# Patient Record
Sex: Male | Born: 1965 | Race: Black or African American | Hispanic: No | Marital: Single | Smoking: Former smoker
Health system: Southern US, Community
[De-identification: ages and names within clinical notes are randomized; demographics above are authoritative.]

## PROBLEM LIST (undated history)

## (undated) DIAGNOSIS — F1010 Alcohol abuse, uncomplicated: Secondary | ICD-10-CM

## (undated) DIAGNOSIS — E785 Hyperlipidemia, unspecified: Secondary | ICD-10-CM

## (undated) DIAGNOSIS — I639 Cerebral infarction, unspecified: Secondary | ICD-10-CM

## (undated) DIAGNOSIS — C3490 Malignant neoplasm of unspecified part of unspecified bronchus or lung: Secondary | ICD-10-CM

## (undated) DIAGNOSIS — E119 Type 2 diabetes mellitus without complications: Secondary | ICD-10-CM

## (undated) DIAGNOSIS — I10 Essential (primary) hypertension: Secondary | ICD-10-CM

## (undated) DIAGNOSIS — C649 Malignant neoplasm of unspecified kidney, except renal pelvis: Secondary | ICD-10-CM

## (undated) DIAGNOSIS — F17210 Nicotine dependence, cigarettes, uncomplicated: Secondary | ICD-10-CM

## (undated) HISTORY — DX: Cerebral infarction, unspecified: I63.9

## (undated) HISTORY — DX: Nicotine dependence, cigarettes, uncomplicated: F17.210

## (undated) HISTORY — DX: Hyperlipidemia, unspecified: E78.5

## (undated) HISTORY — DX: Type 2 diabetes mellitus without complications: E11.9

## (undated) HISTORY — DX: Essential (primary) hypertension: I10

## (undated) HISTORY — DX: Alcohol abuse, uncomplicated: F10.10

---

## 2002-03-31 ENCOUNTER — Encounter: Payer: Self-pay | Admitting: Emergency Medicine

## 2002-03-31 ENCOUNTER — Emergency Department (HOSPITAL_COMMUNITY): Admission: EM | Admit: 2002-03-31 | Discharge: 2002-03-31 | Payer: Self-pay | Admitting: Emergency Medicine

## 2002-04-09 ENCOUNTER — Ambulatory Visit (HOSPITAL_COMMUNITY): Admission: RE | Admit: 2002-04-09 | Discharge: 2002-04-09 | Payer: Self-pay | Admitting: Orthopaedic Surgery

## 2002-04-09 ENCOUNTER — Encounter: Payer: Self-pay | Admitting: Orthopaedic Surgery

## 2012-08-13 DIAGNOSIS — R55 Syncope and collapse: Secondary | ICD-10-CM

## 2013-09-18 ENCOUNTER — Other Ambulatory Visit (HOSPITAL_COMMUNITY): Payer: Self-pay

## 2013-09-18 DIAGNOSIS — G4730 Sleep apnea, unspecified: Secondary | ICD-10-CM

## 2013-10-23 ENCOUNTER — Other Ambulatory Visit (HOSPITAL_COMMUNITY): Payer: Self-pay

## 2013-10-23 DIAGNOSIS — G4730 Sleep apnea, unspecified: Secondary | ICD-10-CM

## 2018-01-23 DIAGNOSIS — M25561 Pain in right knee: Secondary | ICD-10-CM | POA: Insufficient documentation

## 2018-02-01 ENCOUNTER — Ambulatory Visit (INDEPENDENT_AMBULATORY_CARE_PROVIDER_SITE_OTHER): Payer: Self-pay | Admitting: Orthopaedic Surgery

## 2020-04-13 DIAGNOSIS — D751 Secondary polycythemia: Secondary | ICD-10-CM

## 2020-04-13 HISTORY — DX: Secondary polycythemia: D75.1

## 2020-05-25 ENCOUNTER — Other Ambulatory Visit (HOSPITAL_BASED_OUTPATIENT_CLINIC_OR_DEPARTMENT_OTHER): Payer: Self-pay

## 2020-05-25 DIAGNOSIS — G4733 Obstructive sleep apnea (adult) (pediatric): Secondary | ICD-10-CM

## 2020-06-01 ENCOUNTER — Other Ambulatory Visit: Payer: Self-pay | Admitting: Nurse Practitioner

## 2020-06-01 ENCOUNTER — Ambulatory Visit (INDEPENDENT_AMBULATORY_CARE_PROVIDER_SITE_OTHER): Payer: Managed Care, Other (non HMO) | Admitting: Nurse Practitioner

## 2020-06-01 ENCOUNTER — Encounter: Payer: Self-pay | Admitting: Nurse Practitioner

## 2020-06-01 ENCOUNTER — Other Ambulatory Visit: Payer: Self-pay

## 2020-06-01 VITALS — BP 140/80 | HR 96 | Temp 99.1°F | Resp 22 | Ht 72.0 in | Wt 305.0 lb

## 2020-06-01 DIAGNOSIS — E1165 Type 2 diabetes mellitus with hyperglycemia: Secondary | ICD-10-CM

## 2020-06-01 DIAGNOSIS — Z8673 Personal history of transient ischemic attack (TIA), and cerebral infarction without residual deficits: Secondary | ICD-10-CM

## 2020-06-01 DIAGNOSIS — I10 Essential (primary) hypertension: Secondary | ICD-10-CM | POA: Diagnosis not present

## 2020-06-01 DIAGNOSIS — E785 Hyperlipidemia, unspecified: Secondary | ICD-10-CM

## 2020-06-01 DIAGNOSIS — F17210 Nicotine dependence, cigarettes, uncomplicated: Secondary | ICD-10-CM

## 2020-06-01 DIAGNOSIS — F1020 Alcohol dependence, uncomplicated: Secondary | ICD-10-CM

## 2020-06-01 DIAGNOSIS — D751 Secondary polycythemia: Secondary | ICD-10-CM | POA: Diagnosis not present

## 2020-06-01 NOTE — Addendum Note (Signed)
Addended by: Lonn Georgia on: 06/01/2020 10:26 AM   Modules accepted: Orders

## 2020-06-01 NOTE — Addendum Note (Signed)
Addended by: Demetrius Revel on: 06/01/2020 10:20 AM   Modules accepted: Orders

## 2020-06-01 NOTE — Patient Instructions (Addendum)
Return to occupational health, and discuss blood pressure issues.  We will follow-up in 2 months for labs.

## 2020-06-01 NOTE — Progress Notes (Signed)
New Patient Office Visit  Subjective:  Patient ID: Derek Owen, male    DOB: [birth year]  Age: 54 y.o. MRN: [id number]  CC:  Chief Complaint  Patient presents with  . New Patient (Initial Visit)    HPI Derek Owen presents for new patient visit. Transferring care from Wende Neighbors, MD. Last labs and physical drawn 04/13/20.   Past Medical History:  Diagnosis Date  . Alcohol use disorder, mild, abuse   . Cigarette smoker   . Diabetes mellitus without complication ([place])   . Hyperlipidemia   . Hypertension   . Polycythemia 04/13/2020  . Stroke [facility])     History reviewed. No pertinent surgical history.  History reviewed. No pertinent family history.  Social History   Socioeconomic History  . Marital status: Single    Spouse name: Not on file  . Number of children: Not on file  . Years of education: Not on file  . Highest education level: Not on file  Occupational History  . Not on file  Tobacco Use  . Smoking status: Current Every Day Smoker    Packs/day: 1.00    Types: Cigarettes  . Smokeless tobacco: Current User  Substance and Sexual Activity  . Alcohol use: Yes    Comment: states he quit today, but as of 04/13/20 he could drink a fifth of liquor in a day  . Drug use: Not Currently  . Sexual activity: Not Currently  Other Topics Concern  . Not on file  Social History Narrative  . Not on file   Social Determinants of Health   Financial Resource Strain:   . Difficulty of Paying Living Expenses: Not on file  Food Insecurity:   . Worried About Charity fundraiser in the Last Year: Not on file  . Ran Out of Food in the Last Year: Not on file  Transportation Needs:   . Lack of Transportation (Medical): Not on file  . Lack of Transportation (Non-Medical): Not on file  Physical Activity:   . Days of Exercise per Week: Not on file  . Minutes of Exercise per Session: Not on file  Stress:   . Feeling of Stress : Not on file  Social Connections:   .  Frequency of Communication with Friends and Family: Not on file  . Frequency of Social Gatherings with Friends and Family: Not on file  . Attends Religious Services: Not on file  . Active Member of Clubs or Organizations: Not on file  . Attends Archivist Meetings: Not on file  . Marital Status: Not on file  Intimate Partner Violence:   . Fear of Current or Ex-Partner: Not on file  . Emotionally Abused: Not on file  . Physically Abused: Not on file  . Sexually Abused: Not on file    ROS Review of Systems  Constitutional: Negative.   Respiratory: Negative.   Cardiovascular: Negative.   Musculoskeletal: Negative.   Neurological: Negative.   Psychiatric/Behavioral: Negative.     Objective:   Today's Vitals: BP 140/80   Pulse 96   Temp 99.1 F (37.3 C)   Resp (!) 22   Ht 6' (1.829 m)   Wt (!) 305 lb (138.3 kg)   SpO2 97%   BMI 41.37 kg/m   Physical Exam Constitutional:      Appearance: Normal appearance. He is obese.  Cardiovascular:     Rate and Rhythm: Normal rate and regular rhythm.     Pulses: Normal pulses.  Heart sounds: Normal heart sounds.  Pulmonary:     Effort: Pulmonary effort is normal.     Breath sounds: Normal breath sounds.  Musculoskeletal:        General: Normal range of motion.  Neurological:     Mental Status: He is alert.  Psychiatric:        Mood and Affect: Mood normal.        Behavior: Behavior normal.     Assessment & Plan:   Problem List Items Addressed This Visit    None    Visit Diagnoses    Hypertension, essential    -  Primary   Relevant Medications   atorvastatin (LIPITOR) 40 MG tablet   amLODipine (NORVASC) 10 MG tablet   losartan-hydrochlorothiazide (HYZAAR) 100-25 MG tablet   aspirin 81 MG chewable tablet   Secondary polycythemia       Hyperlipidemia, unspecified hyperlipidemia type       Relevant Medications   atorvastatin (LIPITOR) 40 MG tablet   amLODipine (NORVASC) 10 MG tablet    losartan-hydrochlorothiazide (HYZAAR) 100-25 MG tablet   aspirin 81 MG chewable tablet   Uncontrolled type 2 diabetes mellitus with hyperglycemia (HCC)       Relevant Medications   glipiZIDE (GLUCOTROL) 10 MG tablet   atorvastatin (LIPITOR) 40 MG tablet   losartan-hydrochlorothiazide (HYZAAR) 100-25 MG tablet   sitaGLIPtin (JANUVIA) 100 MG tablet   aspirin 81 MG chewable tablet   Personal history of TIA (transient ischemic attack)       Cigarette smoker       Alcohol use disorder, moderate, dependence ([place])          Outpatient Encounter Medications as of 06/01/2020  Medication Sig  . amLODipine (NORVASC) 10 MG tablet Take 10 mg by mouth daily.  Marland Kitchen aspirin 81 MG chewable tablet Chew by mouth daily.  Marland Kitchen atorvastatin (LIPITOR) 40 MG tablet Take 40 mg by mouth daily.  Marland Kitchen glipiZIDE (GLUCOTROL) 10 MG tablet Take 10 mg by mouth daily before breakfast.  . losartan-hydrochlorothiazide (HYZAAR) 100-25 MG tablet Take 1 tablet by mouth daily.  . sitaGLIPtin (JANUVIA) 100 MG tablet Take 100 mg by mouth daily.   No facility-administered encounter medications on file as of 06/01/2020.    HTN -BP 140/80 today -takes HCTZ-losartan daily  HLD -no labs to review today -takes atorvastatin  DM2 -no A1c form today, last drawn Oct 2021 -takes farxiga -takes glipizide -no metformin d/t GI effects -last A1c 8.0 -he was started on Rybelsus at last OV in 04/2020 -on ACE-I and statin  Hx of CVA -takes ASA daily -occurred in 2014, no residual deficits per pt  Nicotine Use -1 ppd smoker since age 1  Alcohol Use Disorder -he states his BP was significantly elevated a few weeks ago -he was drinking up to a fifth per day on his days off -states he quit drinking about a week ago  OSA -considering sleep study with Dr. Merlene Laughter -was referred by occupational health after having elevated BP  Follow-up: Return in about 2 months (around 08/01/2020) for Discuss routine lab work and manage chronic  conditions.   Noreene Larsson, NP

## 2020-06-03 ENCOUNTER — Other Ambulatory Visit: Payer: Self-pay | Admitting: Nurse Practitioner

## 2020-06-03 DIAGNOSIS — R718 Other abnormality of red blood cells: Secondary | ICD-10-CM

## 2020-06-03 LAB — COMPREHENSIVE METABOLIC PANEL
ALT: 30 IU/L (ref 0–44)
AST: 19 IU/L (ref 0–40)
Albumin/Globulin Ratio: 1.3 (ref 1.2–2.2)
Albumin: 4.4 g/dL (ref 3.8–4.9)
Alkaline Phosphatase: 100 IU/L (ref 44–121)
BUN/Creatinine Ratio: 16 (ref 9–20)
BUN: 16 mg/dL (ref 6–24)
Bilirubin Total: 0.4 mg/dL (ref 0.0–1.2)
CO2: 22 mmol/L (ref 20–29)
Calcium: 9.9 mg/dL (ref 8.7–10.2)
Chloride: 98 mmol/L (ref 96–106)
Creatinine, Ser: 0.97 mg/dL (ref 0.76–1.27)
GFR calc Af Amer: 101 mL/min/{1.73_m2} (ref >59)
GFR calc non Af Amer: 88 mL/min/{1.73_m2} (ref >59)
Globulin, Total: 3.4 g/dL (ref 1.5–4.5)
Glucose: 176 mg/dL — ABNORMAL HIGH (ref 65–99)
Potassium: 4.1 mmol/L (ref 3.5–5.2)
Sodium: 136 mmol/L (ref 134–144)
Total Protein: 7.8 g/dL (ref 6.0–8.5)

## 2020-06-03 LAB — MICROALBUMIN / CREATININE URINE RATIO
Creatinine, Urine: 93.9 mg/dL
Microalb/Creat Ratio: 41 mg/g creat — ABNORMAL HIGH (ref 0–29)
Microalbumin, Urine: 38.8 ug/mL

## 2020-06-03 LAB — CBC WITH DIFFERENTIAL/PLATELET
Basophils Absolute: 0.1 10*3/uL (ref 0.0–0.2)
Basos: 1 %
EOS (ABSOLUTE): 0.1 10*3/uL (ref 0.0–0.4)
Eos: 1 %
Hematocrit: 44.5 % (ref 37.5–51.0)
Hemoglobin: 14.4 g/dL (ref 13.0–17.7)
Immature Grans (Abs): 0.1 10*3/uL (ref 0.0–0.1)
Immature Granulocytes: 1 %
Lymphocytes Absolute: 0.9 10*3/uL (ref 0.7–3.1)
Lymphs: 12 %
MCH: 22.9 pg — ABNORMAL LOW (ref 26.6–33.0)
MCHC: 32.4 g/dL (ref 31.5–35.7)
MCV: 71 fL — ABNORMAL LOW (ref 79–97)
Monocytes Absolute: 1.2 10*3/uL — ABNORMAL HIGH (ref 0.1–0.9)
Monocytes: 15 %
Neutrophils Absolute: 5.5 10*3/uL (ref 1.4–7.0)
Neutrophils: 70 %
Platelets: 256 10*3/uL (ref 150–450)
RBC: 6.29 x10E6/uL — ABNORMAL HIGH (ref 4.14–5.80)
RDW: 18.4 % — ABNORMAL HIGH (ref 11.6–15.4)
WBC: 7.8 10*3/uL (ref 3.4–10.8)

## 2020-06-03 LAB — LIPID PANEL W/O CHOL/HDL RATIO
Cholesterol, Total: 142 mg/dL (ref 100–199)
HDL: 49 mg/dL (ref >39)
LDL Chol Calc (NIH): 77 mg/dL (ref 0–99)
Triglycerides: 83 mg/dL (ref 0–149)
VLDL Cholesterol Cal: 16 mg/dL (ref 5–40)

## 2020-06-03 LAB — HGB A1C W/O EAG: Hgb A1c MFr Bld: 6.9 % — ABNORMAL HIGH (ref 4.8–5.6)

## 2020-06-11 ENCOUNTER — Other Ambulatory Visit: Payer: Self-pay

## 2020-06-11 ENCOUNTER — Ambulatory Visit: Payer: Managed Care, Other (non HMO) | Attending: Neurology | Admitting: Neurology

## 2020-06-11 DIAGNOSIS — G4733 Obstructive sleep apnea (adult) (pediatric): Secondary | ICD-10-CM | POA: Diagnosis not present

## 2020-06-14 NOTE — Procedures (Signed)
[place] A. Merlene Laughter, MD     [email or web address]             NOCTURNAL POLYSOMNOGRAPHY   LOCATION: [facility]    Patient Name: Derek Owen, Derek Owen Date: 06/11/2020 Gender: Male D.O.B: 1966-02-01 Age (years): 45 Referring Provider: Phillips Odor MD, ABSM Height (inches): 72 Interpreting Physician: Phillips Odor MD, ABSM Weight (lbs): 306 RPSGT: Peak, Robert BMI: 41 MRN: [id number] Neck Size: 19.00 CLINICAL INFORMATION Sleep Study Type: Split Night CPAP     Indication for sleep study: N/A     Epworth Sleepiness Score: 8  SLEEP STUDY TECHNIQUE As per the AASM Manual for the Scoring of Sleep and Associated Events v2.3 (April 2016) with a hypopnea requiring 4% desaturations.  The channels recorded and monitored were frontal, central and occipital EEG, electrooculogram (EOG), submentalis EMG (chin), nasal and oral airflow, thoracic and abdominal wall motion, anterior tibialis EMG, snore microphone, electrocardiogram, and pulse oximetry. Continuous positive airway pressure (CPAP) was initiated when the patient met split night criteria and was titrated according to treat sleep-disordered breathing.  MEDICATIONS Medications self-administered by patient taken the night of the study : N/A  Current Outpatient Medications:  .  amLODipine (NORVASC) 10 MG tablet, Take 10 mg by mouth daily., Disp: , Rfl:  .  aspirin 81 MG chewable tablet, Chew by mouth daily., Disp: , Rfl:  .  atorvastatin (LIPITOR) 40 MG tablet, Take 40 mg by mouth daily., Disp: , Rfl:  .  glipiZIDE (GLUCOTROL) 10 MG tablet, Take 10 mg by mouth daily before breakfast., Disp: , Rfl:  .  losartan-hydrochlorothiazide (HYZAAR) 100-25 MG tablet, Take 1 tablet by mouth daily., Disp: , Rfl:  .  sitaGLIPtin (JANUVIA) 100 MG tablet, Take 100 mg by mouth daily., Disp: , Rfl:    RESPIRATORY PARAMETERS Diagnostic  Total AHI (/hr): 35.7 RDI (/hr): 35.7 OA Index (/hr): - CA Index (/hr): 0.0 REM  AHI (/hr): 32.0 NREM AHI (/hr): 36.1 Supine AHI (/hr): 53.5 Non-supine AHI (/hr): 26.7 Min O2 Sat (%): 84.00 Mean O2 (%): 91.59 Time below 88% (min): 25.2   Titration  Optimal Pressure (cm): 10 AHI at Optimal Pressure (/hr): 0.0 Min O2 at Optimal Pressure (%): 91.0 Supine % at Optimal (%): 0 Sleep % at Optimal (%): 100   SLEEP ARCHITECTURE The recording time for the entire night was 460.6 minutes.  During a baseline period of 198.2 minutes, the patient slept for 149.5 minutes in REM and nonREM, yielding a sleep efficiency of 75.4%. Sleep onset after lights out was 9.4 minutes with a REM latency of 142.5 minutes. The patient spent 16.72% of the night in stage N1 sleep, 73.24% in stage N2 sleep, 0.00% in stage N3 and 10% in REM.     During the titration period of 257.0 minutes, the patient slept for 202.2 minutes in REM and nonREM, yielding a sleep efficiency of 78.7%. Sleep onset after CPAP initiation was 43.3 minutes with a REM latency of 65.0 minutes. The patient spent 46.34% of the night in stage N1 sleep, 41.05% in stage N2 sleep, 0.74% in stage N3 and 11.9% in REM.  CARDIAC DATA The 2 lead EKG demonstrated sinus rhythm. The mean heart rate was 87.54 beats per minute. Other EKG findings include: PVCs.  LEG MOVEMENT DATA The total Periodic Limb Movements of Sleep (PLMS) were 0. The PLMS index was 0.00.  IMPRESSION Moderate to severe obstructive sleep apnea occurred during the diagnostic portion of the study (AHI = 35.7/hour). The optimal CPAP selected  for this patient is ( 10 cm of water)   Delano Metz, MD Diplomate, American Board of Sleep Medicine.      ELECTRONICALLY SIGNED ON:  06/14/2020, 12:47 PM [place] PH: [phone]   FX: [phone] [place]

## 2020-07-20 ENCOUNTER — Telehealth: Payer: Self-pay

## 2020-07-20 NOTE — Telephone Encounter (Signed)
Patient is requesting to speak with you in regards to a mix up with his medications p# [phone]

## 2020-07-20 NOTE — Telephone Encounter (Signed)
Pt LVM that he needs medication called in

## 2020-07-21 ENCOUNTER — Telehealth: Payer: Self-pay

## 2020-07-21 ENCOUNTER — Other Ambulatory Visit: Payer: Self-pay

## 2020-07-21 DIAGNOSIS — E1165 Type 2 diabetes mellitus with hyperglycemia: Secondary | ICD-10-CM

## 2020-07-21 MED ORDER — GLIPIZIDE 10 MG PO TABS: 10.0000 mg | ORAL_TABLET | Freq: Every day | ORAL | 1 refills | Status: DC

## 2020-07-21 NOTE — Telephone Encounter (Signed)
Glipizide sent.

## 2020-07-21 NOTE — Telephone Encounter (Signed)
Rx sent in, pt informed.

## 2020-07-21 NOTE — Telephone Encounter (Signed)
I spoke with pt, went over medications. Clarified and got rx refill sent.

## 2020-07-21 NOTE — Telephone Encounter (Signed)
Patient needing refills sent to [facility] in [place] for glipizide p# [phone]

## 2020-08-10 ENCOUNTER — Other Ambulatory Visit: Payer: Self-pay

## 2020-08-10 DIAGNOSIS — E1165 Type 2 diabetes mellitus with hyperglycemia: Secondary | ICD-10-CM

## 2020-08-10 MED ORDER — GLIPIZIDE 10 MG PO TABS: 10.0000 mg | ORAL_TABLET | Freq: Every day | ORAL | 1 refills | Status: DC

## 2020-08-31 ENCOUNTER — Other Ambulatory Visit: Payer: Self-pay

## 2020-08-31 ENCOUNTER — Encounter: Payer: Self-pay | Admitting: Nurse Practitioner

## 2020-08-31 ENCOUNTER — Telehealth (INDEPENDENT_AMBULATORY_CARE_PROVIDER_SITE_OTHER): Payer: Managed Care, Other (non HMO) | Admitting: Nurse Practitioner

## 2020-08-31 DIAGNOSIS — M109 Gout, unspecified: Secondary | ICD-10-CM

## 2020-08-31 MED ORDER — COLCHICINE 0.6 MG PO TABS: 0.6000 mg | ORAL_TABLET | Freq: Every day | ORAL | 0 refills | Status: DC | PRN

## 2020-08-31 MED ORDER — DAPAGLIFLOZIN PROPANEDIOL 10 MG PO TABS: 10.0000 mg | ORAL_TABLET | Freq: Every day | ORAL | 1 refills | Status: DC

## 2020-08-31 NOTE — Assessment & Plan Note (Signed)
-  Rx. Colchicine and indomethacin

## 2020-08-31 NOTE — Progress Notes (Signed)
Acute Office Visit  Subjective:    Patient ID: Derek Owen, male    DOB: 18-Jul-1965, 55 y.o.   MRN: [id number]  Chief Complaint  Patient presents with  . Arthritis    Hx of rheumatoid arthritis. Gout    HPI Patient is in today for gout flare. Pain to left knee and to both hands. When he wakes up his hand is swollen and painful. He has taken naproxen.  Past Medical History:  Diagnosis Date  . Alcohol use disorder, mild, abuse   . Cigarette smoker   . Diabetes mellitus without complication ([place])   . Hyperlipidemia   . Hypertension   . Polycythemia 04/13/2020  . Stroke [facility])     History reviewed. No pertinent surgical history.  History reviewed. No pertinent family history.  Social History   Socioeconomic History  . Marital status: Single    Spouse name: Not on file  . Number of children: Not on file  . Years of education: Not on file  . Highest education level: Not on file  Occupational History  . Not on file  Tobacco Use  . Smoking status: Current Every Day Smoker    Packs/day: 1.00    Types: Cigarettes  . Smokeless tobacco: Current User  Substance and Sexual Activity  . Alcohol use: Yes    Comment: states he quit today, but as of 04/13/20 he could drink a fifth of liquor in a day  . Drug use: Not Currently  . Sexual activity: Not Currently  Other Topics Concern  . Not on file  Social History Narrative  . Not on file   Social Determinants of Health   Financial Resource Strain: Not on file  Food Insecurity: Not on file  Transportation Needs: Not on file  Physical Activity: Not on file  Stress: Not on file  Social Connections: Not on file  Intimate Partner Violence: Not on file    Outpatient Medications Prior to Visit  Medication Sig Dispense Refill  . amLODipine (NORVASC) 10 MG tablet Take 10 mg by mouth daily.    Marland Kitchen aspirin 81 MG chewable tablet Chew by mouth daily.    Marland Kitchen atorvastatin (LIPITOR) 40 MG tablet Take 40 mg by mouth daily.    Marland Kitchen  glipiZIDE (GLUCOTROL) 10 MG tablet Take 1 tablet (10 mg total) by mouth daily before breakfast. 90 tablet 1  . losartan-hydrochlorothiazide (HYZAAR) 100-25 MG tablet Take 1 tablet by mouth daily.    . sitaGLIPtin (JANUVIA) 100 MG tablet Take 100 mg by mouth daily.     No facility-administered medications prior to visit.    No Known Allergies  Review of Systems     Objective:    Physical Exam  There were no vitals taken for this visit. Wt Readings from Last 3 Encounters:  06/01/20 (!) 305 lb (138.3 kg)    Health Maintenance Due  Topic Date Due  . Hepatitis C Screening  Never done  . COVID-19 Vaccine (1) Never done  . HIV Screening  Never done  . TETANUS/TDAP  Never done  . COLONOSCOPY (Pts 45-83yrs Insurance coverage will need to be confirmed)  Never done  . INFLUENZA VACCINE  Never done    There are no preventive care reminders to display for this patient.   No results found for: TSH Lab Results  Component Value Date   WBC 7.8 06/01/2020   HGB 14.4 06/01/2020   HCT 44.5 06/01/2020   MCV 71 (L) 06/01/2020   PLT 256 06/01/2020  Lab Results  Component Value Date   NA 136 06/01/2020   K 4.1 06/01/2020   CO2 22 06/01/2020   GLUCOSE 176 (H) 06/01/2020   BUN 16 06/01/2020   CREATININE 0.97 06/01/2020   BILITOT 0.4 06/01/2020   ALKPHOS 100 06/01/2020   AST 19 06/01/2020   ALT 30 06/01/2020   PROT 7.8 06/01/2020   ALBUMIN 4.4 06/01/2020   CALCIUM 9.9 06/01/2020   Lab Results  Component Value Date   CHOL 142 06/01/2020   Lab Results  Component Value Date   HDL 49 06/01/2020   Lab Results  Component Value Date   LDLCALC 77 06/01/2020   Lab Results  Component Value Date   TRIG 83 06/01/2020   No results found for: [facility] Lab Results  Component Value Date   HGBA1C 6.9 (H) 06/01/2020       Assessment & Plan:   Problem List Items Addressed This Visit      Other   Gout    -Rx. Colchicine and indomethacin          Meds ordered this  encounter  Medications  . dapagliflozin propanediol (FARXIGA) 10 MG TABS tablet    Sig: Take 1 tablet (10 mg total) by mouth daily before breakfast.    Dispense:  90 tablet    Refill:  1  . colchicine 0.6 MG tablet    Sig: Take 1 tablet (0.6 mg total) by mouth daily as needed. Take 2 tabs now and 1 tablet in 2 hours if symptoms persist    Dispense:  6 tablet    Refill:  0   Date:  08/31/2020   Location of Patient: Home Location of Provider: Office Consent was obtain for visit to be over via telehealth. I verified that I am speaking with the correct person using two identifiers.  I connected with  [name] on 08/31/20 via telephone and verified that I am speaking with the correct person using two identifiers.   I discussed the limitations of evaluation and management by telemedicine. The patient expressed understanding and agreed to proceed.  Time spent: 15 min   Noreene Larsson, NP

## 2020-09-15 ENCOUNTER — Encounter: Payer: Self-pay | Admitting: Nurse Practitioner

## 2020-09-15 ENCOUNTER — Telehealth: Payer: Managed Care, Other (non HMO) | Admitting: Nurse Practitioner

## 2020-09-15 ENCOUNTER — Other Ambulatory Visit: Payer: Self-pay

## 2020-09-15 DIAGNOSIS — M109 Gout, unspecified: Secondary | ICD-10-CM

## 2020-09-15 MED ORDER — ALLOPURINOL 300 MG PO TABS: ORAL_TABLET | ORAL | 0 refills | Status: DC

## 2020-09-15 MED ORDER — COLCHICINE 0.6 MG PO TABS: 0.6000 mg | ORAL_TABLET | Freq: Every day | ORAL | 0 refills | Status: DC | PRN

## 2020-09-15 MED ORDER — KETOROLAC TROMETHAMINE 10 MG PO TABS
10.0000 mg | ORAL_TABLET | Freq: Three times a day (TID) | ORAL | 0 refills | Status: DC | PRN
Start: 2020-09-15 — End: 2020-10-08

## 2020-09-15 NOTE — Assessment & Plan Note (Signed)
-  Rx. Colchicine -Rx. toradol -Rx. Allopurinol; start after gout flare resolves -if pain doesn't resolve, will consider ortho consult -discussed not using aleve or goody's or other NSAIDs while taking toradol -continue prednisone from UC -may consider stopping HCTZ if gout flares continue

## 2020-09-15 NOTE — Progress Notes (Signed)
Acute Office Visit  Subjective:    Patient ID: Derek Owen, male    DOB: [birth year], 55 y.o.   MRN: [id number]  Chief Complaint  Patient presents with  . Knee Pain    Seen at urgent care on Sunday for L knee pain, was given steroid shot and treated for gout. Pain came back this morning.     HPI Patient is in today for gout-related pain. He was seen at [facility] urgent care on 09/13/20 for a gout flare to his left knee.  He was treated with a toradol injection as well as dexamethasone injection. He was sent home with PO prednisone (6-day dose pack). He states he felt great on Sunday. He took naproxen and a BC powder and that helped some.  Past Medical History:  Diagnosis Date  . Alcohol use disorder, mild, abuse   . Cigarette smoker   . Diabetes mellitus without complication ([place])   . Hyperlipidemia   . Hypertension   . Polycythemia 04/13/2020  . Stroke [facility])     No past surgical history on file.  No family history on file.  Social History   Socioeconomic History  . Marital status: Single    Spouse name: Not on file  . Number of children: Not on file  . Years of education: Not on file  . Highest education level: Not on file  Occupational History  . Not on file  Tobacco Use  . Smoking status: Current Every Day Smoker    Packs/day: 1.00    Types: Cigarettes  . Smokeless tobacco: Current User  Substance and Sexual Activity  . Alcohol use: Yes    Comment: states he quit today, but as of 04/13/20 he could drink a fifth of liquor in a day  . Drug use: Not Currently  . Sexual activity: Not Currently  Other Topics Concern  . Not on file  Social History Narrative  . Not on file   Social Determinants of Health   Financial Resource Strain: Not on file  Food Insecurity: Not on file  Transportation Needs: Not on file  Physical Activity: Not on file  Stress: Not on file  Social Connections: Not on file  Intimate Partner Violence: Not on file    Outpatient Medications  Prior to Visit  Medication Sig Dispense Refill  . amLODipine (NORVASC) 10 MG tablet Take 10 mg by mouth daily.    Marland Kitchen aspirin 81 MG chewable tablet Chew by mouth daily.    Marland Kitchen atorvastatin (LIPITOR) 40 MG tablet Take 40 mg by mouth daily.    . dapagliflozin propanediol (FARXIGA) 10 MG TABS tablet Take 1 tablet (10 mg total) by mouth daily before breakfast. 90 tablet 1  . glipiZIDE (GLUCOTROL) 10 MG tablet Take 1 tablet (10 mg total) by mouth daily before breakfast. 90 tablet 1  . losartan-hydrochlorothiazide (HYZAAR) 100-25 MG tablet Take 1 tablet by mouth daily.    . sitaGLIPtin (JANUVIA) 100 MG tablet Take 100 mg by mouth daily.    . colchicine 0.6 MG tablet Take 1 tablet (0.6 mg total) by mouth daily as needed. Take 2 tabs now and 1 tablet in 2 hours if symptoms persist 6 tablet 0  . ketorolac (TORADOL) 10 MG tablet Take 10 mg by mouth 3 (three) times daily as needed.     No facility-administered medications prior to visit.    No Known Allergies  Review of Systems  Constitutional: Negative.   Respiratory: Negative.   Cardiovascular: Negative.   Musculoskeletal:  Positive for arthralgias.       Left knee pain; prednisone and aleve not helping       Objective:    Physical Exam  There were no vitals taken for this visit. Wt Readings from Last 3 Encounters:  06/01/20 (!) 305 lb (138.3 kg)    Health Maintenance Due  Topic Date Due  . Hepatitis C Screening  Never done  . COVID-19 Vaccine (1) Never done  . HIV Screening  Never done  . TETANUS/TDAP  Never done  . COLONOSCOPY (Pts 45-66yrs Insurance coverage will need to be confirmed)  Never done  . INFLUENZA VACCINE  Never done    There are no preventive care reminders to display for this patient.   No results found for: TSH Lab Results  Component Value Date   WBC 7.8 06/01/2020   HGB 14.4 06/01/2020   HCT 44.5 06/01/2020   MCV 71 (L) 06/01/2020   PLT 256 06/01/2020   Lab Results  Component Value Date   NA 136  06/01/2020   K 4.1 06/01/2020   CO2 22 06/01/2020   GLUCOSE 176 (H) 06/01/2020   BUN 16 06/01/2020   CREATININE 0.97 06/01/2020   BILITOT 0.4 06/01/2020   ALKPHOS 100 06/01/2020   AST 19 06/01/2020   ALT 30 06/01/2020   PROT 7.8 06/01/2020   ALBUMIN 4.4 06/01/2020   CALCIUM 9.9 06/01/2020   Lab Results  Component Value Date   CHOL 142 06/01/2020   Lab Results  Component Value Date   HDL 49 06/01/2020   Lab Results  Component Value Date   LDLCALC 77 06/01/2020   Lab Results  Component Value Date   TRIG 83 06/01/2020   No results found for: CHOLHDL Lab Results  Component Value Date   HGBA1C 6.9 (H) 06/01/2020       Assessment & Plan:   Problem List Items Addressed This Visit   None    Date:  09/15/2020   Location of Patient: Home Location of Provider: Office Consent was obtain for visit to be over via telehealth. I verified that I am speaking with the correct person using two identifiers.  I connected with  [name] on 09/15/20 via telephone and verified that I am speaking with the correct person using two identifiers.   I discussed the limitations of evaluation and management by telemedicine. The patient expressed understanding and agreed to proceed.  Time spent: 25 minutes   Meds ordered this encounter  Medications  . colchicine 0.6 MG tablet    Sig: Take 1 tablet (0.6 mg total) by mouth daily as needed. Take 2 tabs now and 1 tablet in 2 hours if symptoms persist    Dispense:  6 tablet    Refill:  0  . allopurinol (ZYLOPRIM) 300 MG tablet    Sig: Take 0.5 tablets (150 mg total) by mouth daily for 14 days, THEN 1 tablet (300 mg total) daily. Start 1 week after gout flare resolves..    Dispense:  83 tablet    Refill:  0  . ketorolac (TORADOL) 10 MG tablet    Sig: Take 1 tablet (10 mg total) by mouth every 8 (eight) hours as needed for moderate pain or severe pain.    Dispense:  15 tablet    Refill:  0     Noreene Larsson, NP

## 2020-09-15 NOTE — Patient Instructions (Signed)
If knee pain isn't bette rin 5 days, call office, and we will consider ortho consult.

## 2020-09-21 ENCOUNTER — Other Ambulatory Visit: Payer: Self-pay | Admitting: Nurse Practitioner

## 2020-09-21 DIAGNOSIS — M130 Polyarthritis, unspecified: Secondary | ICD-10-CM

## 2020-09-22 ENCOUNTER — Other Ambulatory Visit: Payer: Self-pay | Admitting: Nurse Practitioner

## 2020-09-22 DIAGNOSIS — M130 Polyarthritis, unspecified: Secondary | ICD-10-CM

## 2020-09-22 LAB — CBC WITH DIFFERENTIAL/PLATELET
Basophils Absolute: 0.1 10*3/uL (ref 0.0–0.2)
Basos: 1 %
EOS (ABSOLUTE): 0.1 10*3/uL (ref 0.0–0.4)
Eos: 1 %
Hematocrit: 43.7 % (ref 37.5–51.0)
Hemoglobin: 13.7 g/dL (ref 13.0–17.7)
Immature Grans (Abs): 0.1 10*3/uL (ref 0.0–0.1)
Immature Granulocytes: 1 %
Lymphocytes Absolute: 1.2 10*3/uL (ref 0.7–3.1)
Lymphs: 16 %
MCH: 22.1 pg — ABNORMAL LOW (ref 26.6–33.0)
MCHC: 31.4 g/dL — ABNORMAL LOW (ref 31.5–35.7)
MCV: 71 fL — ABNORMAL LOW (ref 79–97)
Monocytes Absolute: 0.9 10*3/uL (ref 0.1–0.9)
Monocytes: 12 %
Neutrophils Absolute: 5.2 10*3/uL (ref 1.4–7.0)
Neutrophils: 69 %
Platelets: 266 10*3/uL (ref 150–450)
RBC: 6.2 x10E6/uL — ABNORMAL HIGH (ref 4.14–5.80)
RDW: 17.4 % — ABNORMAL HIGH (ref 11.6–15.4)
WBC: 7.5 10*3/uL (ref 3.4–10.8)

## 2020-09-22 LAB — CMP14+EGFR
ALT: 13 IU/L (ref 0–44)
AST: 7 IU/L (ref 0–40)
Albumin/Globulin Ratio: 1.4 (ref 1.2–2.2)
Albumin: 4.2 g/dL (ref 3.8–4.9)
Alkaline Phosphatase: 108 IU/L (ref 44–121)
BUN/Creatinine Ratio: 18 (ref 9–20)
BUN: 18 mg/dL (ref 6–24)
Bilirubin Total: 0.5 mg/dL (ref 0.0–1.2)
CO2: 24 mmol/L (ref 20–29)
Calcium: 9.4 mg/dL (ref 8.7–10.2)
Chloride: 99 mmol/L (ref 96–106)
Creatinine, Ser: 1.01 mg/dL (ref 0.76–1.27)
Globulin, Total: 3.1 g/dL (ref 1.5–4.5)
Glucose: 246 mg/dL — ABNORMAL HIGH (ref 65–99)
Potassium: 4 mmol/L (ref 3.5–5.2)
Sodium: 139 mmol/L (ref 134–144)
Total Protein: 7.3 g/dL (ref 6.0–8.5)
eGFR: 87 mL/min/{1.73_m2} (ref >59)

## 2020-09-22 LAB — URIC ACID: Uric Acid: 6.1 mg/dL (ref 3.8–8.4)

## 2020-09-22 NOTE — Progress Notes (Signed)
I'm referring him to orthopedics. Since his joint pain has been ongoing despite taking medication for gout. I want them to do a joint aspiration to make sure we are dealing with gout. The uric acid level in the blood was normal, but that can happen when the uric acid is turning into crystals and coming out of the blood and getting deposited in joints.

## 2020-09-23 LAB — IRON AND TIBC
Iron Saturation: 13 % — ABNORMAL LOW (ref 15–55)
Iron: 40 ug/dL (ref 38–169)
Total Iron Binding Capacity: 309 ug/dL (ref 250–450)
UIBC: 269 ug/dL (ref 111–343)

## 2020-09-23 LAB — FERRITIN: Ferritin: 513 ng/mL — ABNORMAL HIGH (ref 30–400)

## 2020-09-23 LAB — SPECIMEN STATUS REPORT

## 2020-09-23 NOTE — Progress Notes (Signed)
We did an iron panel because his RBC count is elevated, and his iron is good. No need to add or change medications.

## 2020-09-29 ENCOUNTER — Encounter: Payer: Self-pay | Admitting: Orthopedic Surgery

## 2020-09-29 ENCOUNTER — Ambulatory Visit: Payer: Managed Care, Other (non HMO)

## 2020-09-29 ENCOUNTER — Other Ambulatory Visit: Payer: Self-pay

## 2020-09-29 ENCOUNTER — Ambulatory Visit: Payer: Managed Care, Other (non HMO) | Admitting: Orthopedic Surgery

## 2020-09-29 VITALS — BP 154/104 | HR 120 | Ht 72.0 in | Wt 309.0 lb

## 2020-09-29 DIAGNOSIS — M1712 Unilateral primary osteoarthritis, left knee: Secondary | ICD-10-CM | POA: Diagnosis not present

## 2020-09-29 DIAGNOSIS — M25532 Pain in left wrist: Secondary | ICD-10-CM

## 2020-09-29 DIAGNOSIS — M25539 Pain in unspecified wrist: Secondary | ICD-10-CM

## 2020-09-29 DIAGNOSIS — G8929 Other chronic pain: Secondary | ICD-10-CM

## 2020-09-29 DIAGNOSIS — M25562 Pain in left knee: Secondary | ICD-10-CM

## 2020-09-29 MED ORDER — DICLOFENAC SODIUM 1 % EX GEL: 2.0000 g | Freq: Four times a day (QID) | CUTANEOUS | 1 refills | Status: DC

## 2020-09-29 NOTE — Patient Instructions (Signed)

## 2020-09-29 NOTE — Progress Notes (Addendum)
New Patient Visit  Assessment: Derek Owen is a 55 y.o. LHD male with the following: Left knee arthritis Ulnar sided wrist pain  Plan: CAYDN JUSTEN has moderate arthritis in his left knee.  We reviewed the radiographs in clinic today, and I outlined the natural progression.  We had an extensive discussion regarding all potential treatment options, including continuing with the current treatment. He has previously been advised to avoid NSAIDs due to potential for kidney damage.  I have urged him to remain active, and they can continue with activities on their own, or we can refer them to physical therapy.  We can also consider a brace, or compression sleeve. If the pain is severe enough, we can consider a steroid injection.   After discussing all of these options, the patient has elected to proceed with a steroid injection and a home exercise program.    Regarding his left wrist, radiographs are without acute injury, no degenerative changes.  His pain is atraumatic, no history of a fall.  Recommended voltaren topical for the pain.  If he continues to have difficulty, can consider a referral to a hand specialist.   Procedure note injection Left knee joint   Verbal consent was obtained to inject the left knee joint  Timeout was completed to confirm the site of injection.  The skin was prepped with alcohol and ethyl chloride was sprayed at the injection site.  A 21-gauge needle was used to inject 6 mg of betamethasone and 1% lidocaine (3 cc) into the left knee using an anterolateral approach.  There were no complications. A sterile bandage was applied.      Follow-up: Return if symptoms worsen or fail to improve.  Subjective:  Chief Complaint  Patient presents with  . Knee Pain    Left knee Patient reports this is hurting more in the last few weeks and he hurt the knee about 2 years ago.    History of Present Illness: Derek Owen is a 55 y.o. male who presents for  evaluation of left knee and left wrist pain.  He denies a history of injury to either his wrist or his knee.  His knee has been painful for the past 2 years.  He has previously been diagnosed with gout, and has been treated medically for this.  This has not improved his pain.  He was seen in urgent care center recently, given an IM injection for pain, which helped his knee pain, but this only lasted 1-2 days.  The pain in his knee is diffuse.  His knee will occasionally swell.  He is not taking medications for his knee, other than the gout medications.  Regarding his left wrist, he denies an injury.  He has tenderness in the ulnar aspect of his wrist.  It bothers him the more he uses it.  He denies swelling.  He is not taking medications for his wrist.  He is not using a brace.   Review of Systems: No fevers or chills No numbness or tingling No chest pain No shortness of breath No bowel or bladder dysfunction No GI distress No headaches   Medical History:  Past Medical History:  Diagnosis Date  . Alcohol use disorder, mild, abuse   . Cigarette smoker   . Diabetes mellitus without complication ([place])   . Hyperlipidemia   . Hypertension   . Polycythemia 04/13/2020  . Stroke [facility])     No past surgical history on file.  No family history  on file. Social History   Tobacco Use  . Smoking status: Current Every Day Smoker    Packs/day: 1.00    Types: Cigarettes  . Smokeless tobacco: Current User  Substance Use Topics  . Alcohol use: Yes    Comment: states he quit today, but as of 04/13/20 he could drink a fifth of liquor in a day  . Drug use: Not Currently    No Known Allergies  Current Meds  Medication Sig  . allopurinol (ZYLOPRIM) 300 MG tablet Take 0.5 tablets (150 mg total) by mouth daily for 14 days, THEN 1 tablet (300 mg total) daily. Start 1 week after gout flare resolves..  . amLODipine (NORVASC) 10 MG tablet Take 10 mg by mouth daily.  Marland Kitchen aspirin 81 MG chewable tablet  Chew by mouth daily.  Marland Kitchen atorvastatin (LIPITOR) 40 MG tablet Take 40 mg by mouth daily.  . colchicine 0.6 MG tablet Take 1 tablet (0.6 mg total) by mouth daily as needed. Take 2 tabs now and 1 tablet in 2 hours if symptoms persist  . dapagliflozin propanediol (FARXIGA) 10 MG TABS tablet Take 1 tablet (10 mg total) by mouth daily before breakfast.  . diclofenac Sodium (VOLTAREN) 1 % GEL Apply 2 g topically 4 (four) times daily.  Marland Kitchen glipiZIDE (GLUCOTROL) 10 MG tablet Take 1 tablet (10 mg total) by mouth daily before breakfast.  . ketorolac (TORADOL) 10 MG tablet Take 1 tablet (10 mg total) by mouth every 8 (eight) hours as needed for moderate pain or severe pain.  Marland Kitchen losartan-hydrochlorothiazide (HYZAAR) 100-25 MG tablet Take 1 tablet by mouth daily.  . sitaGLIPtin (JANUVIA) 100 MG tablet Take 100 mg by mouth daily.    Objective: BP (!) 154/104   Pulse (!) 120   Ht 6' (1.829 m)   Wt (!) 309 lb (140.2 kg)   BMI 41.91 kg/m   Physical Exam:  General: Alert and oriented, no acute distress. Gait: Walks slowly, with the assistance of a cane.  Evaluation of left knee demonstrates a mild effusion.  He is a painful arc of motion, but can achieve full extension.  Flexion limited to 100 degrees.  Diffuse tenderness to palpation throughout the knee.  Tenderness to palpation of the posterior aspect of his knee.  Mild crepitus is appreciated with range of motion testing.  Negative Lachman.  No increased laxity to varus or valgus stress.  Evaluation of the left wrist demonstrates no deformity.  No swelling is appreciated.  Tenderness to palpation of the volar and ulnar aspect of the wrist.  He has near full range of motion, with some discomfort.  5/5 grip strength.  Sensation is intact distally.    IMAGING: I personally ordered and reviewed the following images   X-ray of the left knee was obtained in clinic today and demonstrates moderate degenerative changes overall.  Near complete loss of joint space  within the medial compartment.  Osteophytes are appreciated in all 3 compartments.  No acute injury.  Impression: Moderate left knee arthritis  X-ray of the left wrist obtained in clinic today and demonstrates no acute injury.  Minimal degenerative changes are noted.  No evidence of a previous injury around the ulnar styloid.  Impression: Normal left wrist.   New Medications:  Meds ordered this encounter  Medications  . diclofenac Sodium (VOLTAREN) 1 % GEL    Sig: Apply 2 g topically 4 (four) times daily.    Dispense:  150 g    Refill:  1  Mordecai Rasmussen, MD  09/29/2020 8:56 AM

## 2020-09-30 ENCOUNTER — Encounter: Payer: Self-pay | Admitting: Orthopedic Surgery

## 2020-10-06 ENCOUNTER — Emergency Department (HOSPITAL_COMMUNITY)
Admission: EM | Admit: 2020-10-06 | Discharge: 2020-10-06 | Disposition: A | Discharge status: Home / Self Care | Payer: Managed Care, Other (non HMO) | Attending: Emergency Medicine | Admitting: Emergency Medicine

## 2020-10-06 ENCOUNTER — Encounter (HOSPITAL_COMMUNITY): Payer: Self-pay | Admitting: *Deleted

## 2020-10-06 ENCOUNTER — Other Ambulatory Visit: Payer: Self-pay

## 2020-10-06 DIAGNOSIS — Z79899 Other long term (current) drug therapy: Secondary | ICD-10-CM | POA: Diagnosis not present

## 2020-10-06 DIAGNOSIS — M2550 Pain in unspecified joint: Secondary | ICD-10-CM

## 2020-10-06 DIAGNOSIS — E119 Type 2 diabetes mellitus without complications: Secondary | ICD-10-CM | POA: Diagnosis not present

## 2020-10-06 DIAGNOSIS — M25572 Pain in left ankle and joints of left foot: Secondary | ICD-10-CM | POA: Diagnosis not present

## 2020-10-06 DIAGNOSIS — M25532 Pain in left wrist: Secondary | ICD-10-CM | POA: Insufficient documentation

## 2020-10-06 DIAGNOSIS — M25561 Pain in right knee: Secondary | ICD-10-CM | POA: Diagnosis not present

## 2020-10-06 DIAGNOSIS — Z7982 Long term (current) use of aspirin: Secondary | ICD-10-CM | POA: Diagnosis not present

## 2020-10-06 DIAGNOSIS — F17210 Nicotine dependence, cigarettes, uncomplicated: Secondary | ICD-10-CM | POA: Diagnosis not present

## 2020-10-06 DIAGNOSIS — Z7984 Long term (current) use of oral hypoglycemic drugs: Secondary | ICD-10-CM | POA: Insufficient documentation

## 2020-10-06 DIAGNOSIS — M25571 Pain in right ankle and joints of right foot: Secondary | ICD-10-CM | POA: Diagnosis not present

## 2020-10-06 DIAGNOSIS — I10 Essential (primary) hypertension: Secondary | ICD-10-CM | POA: Insufficient documentation

## 2020-10-06 DIAGNOSIS — M25531 Pain in right wrist: Secondary | ICD-10-CM | POA: Diagnosis not present

## 2020-10-06 DIAGNOSIS — M25562 Pain in left knee: Secondary | ICD-10-CM | POA: Diagnosis not present

## 2020-10-06 LAB — SEDIMENTATION RATE: Sed Rate: 20 mm/hr — ABNORMAL HIGH (ref 0–16)

## 2020-10-06 MED ORDER — ACETAMINOPHEN 500 MG PO TABS: 500.0000 mg | ORAL_TABLET | Freq: Four times a day (QID) | ORAL | 0 refills | Status: DC | PRN

## 2020-10-06 NOTE — ED Provider Notes (Signed)
[facility] EMERGENCY DEPARTMENT Provider Note   CSN: [id number] Arrival date & time: 10/06/20  0370     History Chief Complaint  Patient presents with  . Joint Pain    Derek Owen is a 55 y.o. male.  HPI Patient is a 55 year old male with a history of hyperlipidemia, hypertension, polycythemia, CVA, diabetes mellitus, who presents the emergency department due to joint pain.  Patient states his symptoms started about 5 to 6 weeks ago.  He reports pain in the knees, ankles, and wrists bilaterally.  Pain worsens with standing, ambulation, and movement.  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  He states that he has been evaluated for this by his PCP as well as orthopedics.  He states he had a steroid injection in the left knee recently which provided short relief for about 2 days before his pain returned.  He states that he is now ambulating with a cane because his pain is worsened so much.  He states he was not ambulating with a cane prior to this.  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  No history of similar symptoms.  No falls or injuries.  Patient has no other complaints at this time.    Past Medical History:  Diagnosis Date  . Alcohol use disorder, mild, abuse   . Cigarette smoker   . Diabetes mellitus without complication ([place])   . Hyperlipidemia   . Hypertension   . Polycythemia 04/13/2020  . Stroke [facility])    x3    Patient Active Problem List   Diagnosis Date Noted  . Gout 08/31/2020    No past surgical history on file.     No family history on file.  Social History   Tobacco Use  . Smoking status: Current Every Day Smoker    Packs/day: 1.00    Types: Cigarettes  . Smokeless tobacco: Current User  Vaping Use  . Vaping Use: Never used  Substance Use Topics  . Alcohol use: Not Currently    Comment: as of 04/13/20 he reports he would drink  1/5 liquor daily   . Drug use: Not Currently    Home Medications Prior to Admission medications   Medication Sig Start Date End Date Taking? Authorizing Provider  acetaminophen (TYLENOL) 500 MG tablet Take 1 tablet (500 mg total) by mouth every 6 (six) hours as needed. 10/06/20  Yes Rayna Sexton, PA-C  allopurinol (ZYLOPRIM) 300 MG tablet Take 0.5 tablets (150 mg total) by mouth daily for 14 days, THEN 1 tablet (300 mg total) daily. Start 1 week after gout flare resolves.. 09/15/20 12/14/20  Noreene Larsson, NP  amLODipine (NORVASC) 10 MG tablet Take 10 mg by mouth daily.    [provider]  aspirin 81 MG chewable tablet Chew by mouth daily.    [provider]  atorvastatin (LIPITOR) 40 MG tablet Take 40 mg by mouth daily.    [provider]  colchicine 0.6 MG tablet Take 1 tablet (0.6 mg total) by mouth daily as needed. Take 2 tabs now and 1 tablet in 2 hours if symptoms persist 09/15/20   Noreene Larsson, NP  dapagliflozin propanediol (FARXIGA) 10 MG TABS tablet Take 1 tablet (10 mg total) by mouth daily before breakfast. 08/31/20   Noreene Larsson, NP  diclofenac Sodium (VOLTAREN) 1 % GEL Apply 2  g topically 4 (four) times daily. 09/29/20   Mordecai Rasmussen, MD  glipiZIDE (GLUCOTROL) 10 MG tablet Take 1 tablet (10 mg total) by mouth daily before breakfast. 08/10/20   Noreene Larsson, NP  ketorolac (TORADOL) 10 MG tablet Take 1 tablet (10 mg total) by mouth every 8 (eight) hours as needed for moderate pain or severe pain. 09/15/20   Noreene Larsson, NP  losartan-hydrochlorothiazide (HYZAAR) 100-25 MG tablet Take 1 tablet by mouth daily.    [provider]  sitaGLIPtin (JANUVIA) 100 MG tablet Take 100 mg by mouth daily.    [provider]    Allergies    Patient has no known allergies.  Review of Systems   Review of Systems  Constitutional: Negative for fever.  Respiratory: Negative for shortness of breath.   Cardiovascular: Negative for chest pain.   Musculoskeletal: Positive for arthralgias. Negative for joint swelling.  Neurological: Negative for weakness and numbness.   Physical Exam Updated Vital Signs BP (!) 146/93 (BP Location: Right Arm)   Pulse (!) 108   Temp 98.6 F (37 C) (Oral)   Resp 20   Ht 6' (1.829 m)   Wt (!) 140.6 kg   SpO2 97%   BMI 42.04 kg/m   Physical Exam Vitals and nursing note reviewed.  Constitutional:      General: He is not in acute distress.    Appearance: He is well-developed.  HENT:     Head: Normocephalic and atraumatic.     Right Ear: External ear normal.     Left Ear: External ear normal.  Eyes:     General: No scleral icterus.       Right eye: No discharge.        Left eye: No discharge.     Conjunctiva/sclera: Conjunctivae normal.  Neck:     Trachea: No tracheal deviation.  Cardiovascular:     Rate and Rhythm: Normal rate.  Pulmonary:     Effort: Pulmonary effort is normal. No respiratory distress.     Breath sounds: No stridor.  Abdominal:     General: There is no distension.  Musculoskeletal:        General: Tenderness present. No swelling or deformity.     Cervical back: Neck supple.     Comments: Palpable pain throughout the wrists, hands, and bilateral knees.  No overlying skin changes.  No joint swelling.  No nodules.  Full range of motion of the wrists, hands, and knees.  Patient is able to stand and ambulate with an antalgic gait using the assistance of a cane.  Skin:    General: Skin is warm and dry.     Findings: No rash.  Neurological:     General: No focal deficit present.     Mental Status: He is alert and oriented to person, place, and time.     Cranial Nerves: Cranial nerve deficit: no gross deficits.    ED Results / Procedures / Treatments   Labs (all labs ordered are listed, but only abnormal results are displayed) Labs Reviewed  RHEUMATOID FACTOR  SEDIMENTATION RATE    EKG None  Radiology No results found.  Procedures Procedures    Medications Ordered in ED Medications - No data to display  ED Course  I have reviewed the triage vital signs and the nursing notes.  Pertinent labs & imaging results that were available during my care of the patient were reviewed by me and considered in my medical decision making (see  chart for details).    MDM Rules/Calculators/A&P                          Patient is a 55 year old male who presents the emergency department with what appears to be symmetrical polyarthralgias.  Patient has been evaluated by primary care as well as orthopedics for this.  He was prescribed Voltaren gel which he has not taken as of yet.  He was also given a joint injection in the left knee which provided short-term relief for his pain then returned and worsened.  Per records, it appears that his uric acid level was recently measured and was normal.  He also had imaging performed of his left knee and his left wrist.  X-rays of the left knee show moderate degenerative changes.  Near complete loss of joint space within the medial compartment.  Osteophytes are appreciated in all 3 compartments.  No acute injury.  The left wrist showed mild degenerative changes.  Unsure of the cause of the patient's pain.  Given that it's symmetrical in nature and appears to be worsening, there is concern for possible rheumatological cause. This does not appear to be gout, though he does have a history of gout and has been taking his medications for gout without relief.  We will have the nursing staff obtain a rheumatoid factor as well as an ESR and will discharge patient with primary care follow-up.  Also prescribed him a course of tylenol.  He states he is going to call his PCP later today.  He has a history of DM so do not feel that systemic steroids are appropriate.  Recommended that he begin using his Voltaren gel as well.  His questions were answered and he was amicable to time of discharge.  Final Clinical Impression(s) / ED  Diagnoses Final diagnoses:  Polyarthralgia    Rx / DC Orders ED Discharge Orders         Ordered    acetaminophen (TYLENOL) 500 MG tablet  Every 6 hours PRN        10/06/20 1219           Rayna Sexton, PA-C 10/07/20 2313    Davonna Belling, MD 10/08/20 1513

## 2020-10-06 NOTE — ED Triage Notes (Signed)
Pt c/o joint pain to bilateral knees, hips, wrists that started 5-6 weeks ago. Pt has been seen by Orthopedist recently and they gave him a shot in his knee and then the pain comes right back. 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

## 2020-10-06 NOTE — Discharge Instructions (Addendum)
Like we discussed, I will prescribe you Tylenol for management of your pain. You can take 550m up to four times per day for management of your pain.   You have already been prescribed Voltaren gel. This is a topical cream that you can apply to the joints that are bothering you. Please pick up this medication and begin using it.  I have obtained two labs on you. One is called a "rheumatoid factor" and the other is called an "ESR". When you follow up with your regular doctor across the street they can check these lab values and discuss them with you further.   If your symptoms worsen you can always return to the ED. It was a pleasure to meet you.

## 2020-10-07 LAB — RHEUMATOID FACTOR: Rheumatoid fact SerPl-aCnc: 66.2 IU/mL — ABNORMAL HIGH (ref <14.0)

## 2020-10-08 ENCOUNTER — Other Ambulatory Visit: Payer: Self-pay

## 2020-10-08 ENCOUNTER — Ambulatory Visit (INDEPENDENT_AMBULATORY_CARE_PROVIDER_SITE_OTHER): Payer: Managed Care, Other (non HMO) | Admitting: Nurse Practitioner

## 2020-10-08 ENCOUNTER — Encounter: Payer: Self-pay | Admitting: Nurse Practitioner

## 2020-10-08 VITALS — BP 163/88 | HR 114 | Temp 98.6°F | Resp 20 | Ht 72.0 in | Wt 305.0 lb

## 2020-10-08 DIAGNOSIS — Z23 Encounter for immunization: Secondary | ICD-10-CM | POA: Diagnosis not present

## 2020-10-08 DIAGNOSIS — M25562 Pain in left knee: Secondary | ICD-10-CM

## 2020-10-08 MED ORDER — IBUPROFEN 800 MG PO TABS: 800.0000 mg | ORAL_TABLET | Freq: Three times a day (TID) | ORAL | 0 refills | Status: DC | PRN

## 2020-10-08 NOTE — Assessment & Plan Note (Signed)
TDaP today 

## 2020-10-08 NOTE — Assessment & Plan Note (Addendum)
-  ongoing for 3 weeks -he has failed treatment for gout and was referred to ortho; has positive RA -referral to rheumatology -Rx. Ibuprofen -will recheck uric acid today; will also check CRP and ANA

## 2020-10-08 NOTE — Progress Notes (Signed)
Acute Office Visit  Subjective:    Patient ID: Derek Owen, male    DOB: [birth year], 55 y.o.   MRN: [id number]  Chief Complaint  Patient presents with  . Joint Pain    Knee, wrist x 3 weeks; worsening. Pt has been to urgent care, and ER.     HPI Patient is in today for left knee pain x3 weeks. We thought he had gout previously, but his uric acid was WNL and gouts meds have not been helping. He was referred to ortho, but steroid injection didn't help. He went to [facility] and ED and sed rate was mildly elevated and rheumatoid factor was elevated.  Past Medical History:  Diagnosis Date  . Alcohol use disorder, mild, abuse   . Cigarette smoker   . Diabetes mellitus without complication ([place])   . Hyperlipidemia   . Hypertension   . Polycythemia 04/13/2020  . Stroke [facility])    x3    History reviewed. No pertinent surgical history.  History reviewed. No pertinent family history.  Social History   Socioeconomic History  . Marital status: Single    Spouse name: Not on file  . Number of children: Not on file  . Years of education: Not on file  . Highest education level: Not on file  Occupational History  . Not on file  Tobacco Use  . Smoking status: Current Every Day Smoker    Packs/day: 1.00    Types: Cigarettes  . Smokeless tobacco: Current User  Vaping Use  . Vaping Use: Never used  Substance and Sexual Activity  . Alcohol use: Not Currently    Comment: as of 04/13/20 he reports he would drink 1/5 liquor daily   . Drug use: Not Currently  . Sexual activity: Not Currently    Birth control/protection: Other-see comments  Other Topics Concern  . Not on file  Social History Narrative  . Not on file   Social Determinants of Health   Financial Resource Strain: Not on file  Food Insecurity: Not on file  Transportation Needs: Not on file  Physical Activity: Not on file  Stress: Not on file  Social Connections: Not on file  Intimate Partner Violence: Not on file     Outpatient Medications Prior to Visit  Medication Sig Dispense Refill  . acetaminophen (TYLENOL) 500 MG tablet Take 1 tablet (500 mg total) by mouth every 6 (six) hours as needed. 30 tablet 0  . allopurinol (ZYLOPRIM) 300 MG tablet Take 0.5 tablets (150 mg total) by mouth daily for 14 days, THEN 1 tablet (300 mg total) daily. Start 1 week after gout flare resolves.. 83 tablet 0  . amLODipine (NORVASC) 10 MG tablet Take 10 mg by mouth daily.    Marland Kitchen aspirin 81 MG chewable tablet Chew by mouth daily.    Marland Kitchen atorvastatin (LIPITOR) 40 MG tablet Take 40 mg by mouth daily.    . colchicine 0.6 MG tablet Take 1 tablet (0.6 mg total) by mouth daily as needed. Take 2 tabs now and 1 tablet in 2 hours if symptoms persist 6 tablet 0  . dapagliflozin propanediol (FARXIGA) 10 MG TABS tablet Take 1 tablet (10 mg total) by mouth daily before breakfast. 90 tablet 1  . diclofenac Sodium (VOLTAREN) 1 % GEL Apply 2 g topically 4 (four) times daily. 150 g 1  . glipiZIDE (GLUCOTROL) 10 MG tablet Take 1 tablet (10 mg total) by mouth daily before breakfast. 90 tablet 1  . losartan-hydrochlorothiazide (HYZAAR) 100-25 MG  tablet Take 1 tablet by mouth daily.    . sitaGLIPtin (JANUVIA) 100 MG tablet Take 100 mg by mouth daily.    Marland Kitchen ketorolac (TORADOL) 10 MG tablet Take 1 tablet (10 mg total) by mouth every 8 (eight) hours as needed for moderate pain or severe pain. 15 tablet 0   No facility-administered medications prior to visit.    No Known Allergies  Review of Systems  Constitutional: Negative.   Respiratory: Negative.   Cardiovascular: Negative.   Musculoskeletal: Positive for arthralgias.       Left knee and left wrist       Objective:    Physical Exam Constitutional:      Appearance: He is obese.  Cardiovascular:     Rate and Rhythm: Normal rate and regular rhythm.     Pulses: Normal pulses.     Heart sounds: Normal heart sounds.  Pulmonary:     Effort: Pulmonary effort is normal.     Breath  sounds: Normal breath sounds.  Musculoskeletal:        General: Swelling and tenderness present.     Comments: Erythema to left knee, and left knee is hot to touch  Neurological:     Mental Status: He is alert.     BP (!) 163/88   Pulse (!) 114   Temp 98.6 F (37 C)   Resp 20   Ht 6' (1.829 m)   Wt (!) 305 lb (138.3 kg)   SpO2 96%   BMI 41.37 kg/m  Wt Readings from Last 3 Encounters:  10/08/20 (!) 305 lb (138.3 kg)  10/06/20 (!) 310 lb (140.6 kg)  09/29/20 (!) 309 lb (140.2 kg)    Health Maintenance Due  Topic Date Due  . Hepatitis C Screening  Never done  . HIV Screening  Never done  . COLONOSCOPY (Pts 45-65yrs Insurance coverage will need to be confirmed)  Never done    There are no preventive care reminders to display for this patient.   No results found for: TSH Lab Results  Component Value Date   WBC 7.5 09/21/2020   HGB 13.7 09/21/2020   HCT 43.7 09/21/2020   MCV 71 (L) 09/21/2020   PLT 266 09/21/2020   Lab Results  Component Value Date   NA 139 09/21/2020   K 4.0 09/21/2020   CO2 24 09/21/2020   GLUCOSE 246 (H) 09/21/2020   BUN 18 09/21/2020   CREATININE 1.01 09/21/2020   BILITOT 0.5 09/21/2020   ALKPHOS 108 09/21/2020   AST 7 09/21/2020   ALT 13 09/21/2020   PROT 7.3 09/21/2020   ALBUMIN 4.2 09/21/2020   CALCIUM 9.4 09/21/2020   Lab Results  Component Value Date   CHOL 142 06/01/2020   Lab Results  Component Value Date   HDL 49 06/01/2020   Lab Results  Component Value Date   LDLCALC 77 06/01/2020   Lab Results  Component Value Date   TRIG 83 06/01/2020   No results found for: CHOLHDL Lab Results  Component Value Date   HGBA1C 6.9 (H) 06/01/2020       Assessment & Plan:   Problem List Items Addressed This Visit      Other   Left knee pain    -ongoing for 3 weeks -he has failed treatment for gout and was referred to ortho; has positive RA -referral to rheumatology -Rx. Ibuprofen -will recheck uric acid today; will  also check CRP and ANA      Relevant Orders  Ambulatory referral to Rheumatology   Antinuclear Antib (ANA)   C-reactive protein   CBC with Differential/Platelet   Basic metabolic panel   Uric acid   Immunization due - Primary    -TDaP today      Relevant Orders   Tdap vaccine greater than or equal to 7yo IM (Completed)       Meds ordered this encounter  Medications  . ibuprofen (ADVIL) 800 MG tablet    Sig: Take 1 tablet (800 mg total) by mouth every 8 (eight) hours as needed.    Dispense:  30 tablet    Refill:  0     Noreene Larsson, NP

## 2020-10-09 ENCOUNTER — Other Ambulatory Visit: Payer: Self-pay | Admitting: Nurse Practitioner

## 2020-10-09 DIAGNOSIS — M130 Polyarthritis, unspecified: Secondary | ICD-10-CM

## 2020-10-09 DIAGNOSIS — R718 Other abnormality of red blood cells: Secondary | ICD-10-CM

## 2020-10-09 NOTE — Progress Notes (Signed)
I'm casting a wide net. Your RBCs are elevated. This may be due to cigarette smoking, but since you are having this new joint pain, I would like the blood doctor to see you and run some tests in addition to the rheumatologist.

## 2020-10-10 LAB — CBC WITH DIFFERENTIAL/PLATELET
Basophils Absolute: 0.1 10*3/uL (ref 0.0–0.2)
Basos: 1 %
EOS (ABSOLUTE): 0.1 10*3/uL (ref 0.0–0.4)
Eos: 1 %
Hematocrit: 45.2 % (ref 37.5–51.0)
Hemoglobin: 14 g/dL (ref 13.0–17.7)
Immature Grans (Abs): 0.1 10*3/uL (ref 0.0–0.1)
Immature Granulocytes: 1 %
Lymphocytes Absolute: 1.3 10*3/uL (ref 0.7–3.1)
Lymphs: 14 %
MCH: 21.5 pg — ABNORMAL LOW (ref 26.6–33.0)
MCHC: 31 g/dL — ABNORMAL LOW (ref 31.5–35.7)
MCV: 69 fL — ABNORMAL LOW (ref 79–97)
Monocytes Absolute: 1.1 10*3/uL — ABNORMAL HIGH (ref 0.1–0.9)
Monocytes: 12 %
Neutrophils Absolute: 6.3 10*3/uL (ref 1.4–7.0)
Neutrophils: 71 %
Platelets: 267 10*3/uL (ref 150–450)
RBC: 6.51 x10E6/uL — ABNORMAL HIGH (ref 4.14–5.80)
RDW: 17.9 % — ABNORMAL HIGH (ref 11.6–15.4)
WBC: 8.8 10*3/uL (ref 3.4–10.8)

## 2020-10-10 LAB — BASIC METABOLIC PANEL
BUN/Creatinine Ratio: 22 — ABNORMAL HIGH (ref 9–20)
BUN: 23 mg/dL (ref 6–24)
CO2: 20 mmol/L (ref 20–29)
Calcium: 9.8 mg/dL (ref 8.7–10.2)
Chloride: 96 mmol/L (ref 96–106)
Creatinine, Ser: 1.04 mg/dL (ref 0.76–1.27)
Glucose: 147 mg/dL — ABNORMAL HIGH (ref 65–99)
Potassium: 4.4 mmol/L (ref 3.5–5.2)
Sodium: 135 mmol/L (ref 134–144)
eGFR: 84 mL/min/{1.73_m2} (ref >59)

## 2020-10-10 LAB — C-REACTIVE PROTEIN: CRP: 43 mg/L — ABNORMAL HIGH (ref 0–10)

## 2020-10-10 LAB — URIC ACID: Uric Acid: 5.8 mg/dL (ref 3.8–8.4)

## 2020-10-10 LAB — ANA: Anti Nuclear Antibody (ANA): POSITIVE — AB

## 2020-10-12 ENCOUNTER — Other Ambulatory Visit: Payer: Self-pay

## 2020-10-12 ENCOUNTER — Encounter: Payer: Self-pay | Admitting: Internal Medicine

## 2020-10-12 ENCOUNTER — Ambulatory Visit: Payer: Managed Care, Other (non HMO) | Admitting: Internal Medicine

## 2020-10-12 VITALS — BP 168/99 | HR 116 | Resp 18 | Ht 71.75 in | Wt 307.0 lb

## 2020-10-12 DIAGNOSIS — R7989 Other specified abnormal findings of blood chemistry: Secondary | ICD-10-CM | POA: Diagnosis not present

## 2020-10-12 DIAGNOSIS — M25532 Pain in left wrist: Secondary | ICD-10-CM | POA: Diagnosis not present

## 2020-10-12 DIAGNOSIS — R768 Other specified abnormal immunological findings in serum: Secondary | ICD-10-CM

## 2020-10-12 DIAGNOSIS — M0580 Other rheumatoid arthritis with rheumatoid factor of unspecified site: Secondary | ICD-10-CM | POA: Diagnosis not present

## 2020-10-12 DIAGNOSIS — Z79899 Other long term (current) drug therapy: Secondary | ICD-10-CM | POA: Diagnosis not present

## 2020-10-12 NOTE — Patient Instructions (Signed)
Anticyclic-Citrullinated Peptide Antibody Test Why am I having this test? You may have the anticyclic-citrullinated peptide antibody test done to help:  Diagnose rheumatoid arthritis (RA). RA is a long-term (chronic) disease that causes inflammation in the joints.  Determine the severity of your RA, including how much worse it is getting (progression). This test may be done if you have unexplained joint inflammation and have previously tested negative for rheumatoid factor. It may also be done if you have been diagnosed with undifferentiated arthritis and your health care provider suspects rheumatoid arthritis. What is being tested? This test checks your blood for the presence of anticyclic-citrullinated peptide antibodies. Antibodies are cells that are part of the body's disease-fighting (immune) system. These antibodies appear early in the course of RA and are thought to be directly involved in the progression of the disease. What kind of sample is taken? A blood sample is required for this test. It is usually collected by inserting a needle into a blood vessel.   How are the results reported? Your test results will be reported as either positive or negative. A result is considered negative if there is less than 20 units of the antibody per mL of blood. What do the results mean?  A positive blood test may mean that you have RA.  A negative blood test means that it is less likely that you have RA. However, a negative test does not completely rule out rheumatoid arthritis. Talk with your health care provider about what your results mean. Questions to ask your health care provider Ask your health care provider, or the department that is doing the test:  When will my results be ready?  How will I get my results?  What are my treatment options?  What other tests do I need?  What are my next steps? Summary  The anticyclic-citrullinated peptide antibody blood test may be done to help  your health care provider diagnose rheumatoid arthritis (RA).  This test checks your blood for the presence of anticyclic-citrullinated peptide antibodies. These antibodies appear early in the course of RA.  A positive blood test may mean that you have RA.    Erythrocyte Sedimentation Rate Test Why am I having this test? The erythrocyte sedimentation rate (ESR) test is used to help find illnesses related to:  Sudden (acute) or long-term (chronic) infections.  Inflammation.  The body's disease-fighting system attacking healthy cells (autoimmune diseases).  Cancer.  Tissue death. If you have symptoms that may be related to any of these illnesses, your health care provider may do an ESR test before doing more specific tests. If you have an inflammatory immune disease, such as rheumatoid arthritis, you may have this test to help monitor your therapy. What is being tested? This test measures how long it takes for your red blood cells (erythrocytes) to settle in a solution over a certain amount of time (sedimentation rate). When you have an infection or inflammation, your red blood cells clump together and settle faster. The sedimentation rate provides information about how much inflammation is present in the body. What kind of sample is taken? A blood sample is required for this test. It is usually collected by inserting a needle into a blood vessel.   How do I prepare for this test? Follow any instructions from your health care provider about changing or stopping your regular medicines. Tell a health care provider about:  Any allergies you have.  All medicines you are taking, including vitamins, herbs, eye drops, creams, and  over-the-counter medicines.  Any blood disorders you have.  Any surgeries you have had.  Any medical conditions you have, such as thyroid or kidney disease.  Whether you are pregnant or may be pregnant. How are the results reported? Your results will be  reported as a value that measures sedimentation rate in millimeters per hour (mm/hr). Your health care provider will compare your results to normal ranges that were established after testing a large group of people (reference values). Reference values may vary among labs and hospitals. For this test, common reference values, which vary by age and gender, are:  Newborn: 0-2 mm/hr.  Child, up to puberty: 0-10 mm/hr.  Male: ? Under 50 years: 0-20 mm/hr. ? 50-85 years: 0-30 mm/hr. ? Over 85 years: 0-42 mm/hr.  Male: ? Under 50 years: 0-15 mm/hr. ? 50-85 years: 0-20 mm/hr. ? Over 85 years: 0-30 mm/hr. Certain conditions or medicines may cause ESR levels to be falsely lower or higher, such as:  Pregnancy.  Obesity.  Steroids, birth control pills, and blood thinners.  Thyroid or kidney disease. What do the results mean? Results that are within reference values are considered normal, meaning that the level of inflammation in your body is healthy. High ESR levels mean that there is inflammation in your body. You will have more tests to help make a diagnosis. Inflammation may result from many different conditions or injuries. Talk with your health care provider about what your results mean. Questions to ask your health care provider Ask your health care provider, or the department that is doing the test:  When will my results be ready?  How will I get my results?  What are my treatment options?  What other tests do I need?  What are my next steps? Summary  The erythrocyte sedimentation rate (ESR) test is used to help find illnesses associated with sudden (acute) or long-term (chronic) infections, inflammation, autoimmune diseases, cancer, or tissue death.  If you have symptoms that may be related to any of these illnesses, your health care provider may do an ESR test before doing more specific tests. If you have an inflammatory immune disease, such as rheumatoid arthritis, you may  have this test to help monitor your therapy.  This test measures how long it takes for your red blood cells (erythrocytes) to settle in a solution over a certain amount of time (sedimentation rate). This provides information about how much inflammation is present in the body.    Rheumatoid Factor Test Why am I having this test? The rheumatoid factor test is used to help diagnose certain autoimmune diseases. Normally, your body makes protective proteins called antibodies (IgM, IgG, and IgA) to help fight off infections. If you have an autoimmune disease, your body may make a collection of antibodies that do not function correctly (autoantibodies). They attack tissues that are wrongly identified as foreign. In some autoimmune diseases, these autoantibodies are known as the rheumatoid factor (RF). You may have this test if your health care provider suspects that you have an autoimmune disease, such as:  Rheumatoid arthritis (RA).  Systemic lupus erythematosus (SLE).  Sjgren's syndrome.  Mixed connective tissue disease. A majority of people who have rheumatoid arthritis have a positive rheumatoid factor. Raised levels of these autoantibodies can also sometimes be a sign of other autoimmune diseases. However, it is also possible for the RF test to be negative even when a disease is present. Likewise, a small number of people may have a positive RF test when an  autoimmune disease is not actually present. Other tests may be needed to help make a diagnosis. What is being tested? This test checks your blood for the RF autoantibodies. What kind of sample is taken? A blood sample is required for this test. It is usually collected by inserting a needle into a blood vessel or by sticking a finger with a small needle.   How are the results reported? Your test result will be reported as either positive or negative for RF autoantibodies. What do the results mean? A negative result means that no RF or only  a small amount was found in your blood. This means that it is unlikely that you have an autoimmune disease. A positive result means that a larger amount of RF autoantibodies was found in your blood. This may indicate that you have RA or another autoimmune disease. Your health care provider will talk to you about doing more tests to confirm your results. Talk with your health care provider about what your results mean. Questions to ask your health care provider Ask your health care provider, or the department that is doing the test:  When will my results be ready?  How will I get my results?  What are my treatment options?  What other tests do I need?  What are my next steps? Summary  The rheumatoid factor test is used to help diagnose certain autoimmune diseases.  In some autoimmune diseases, the body makes autoantibodies known as the rheumatoid factor (RF), which attack tissues that are wrongly identified as foreign.  A negative result means that no RF or only a small amount was found in your blood.  A positive result means that a larger amount of RF autoantibodies was found in your blood.  Talk with your health care provider about what your results mean. This information is not intended to replace advice given to you by your health care provider. Make sure you discuss any questions you have with your health care provider. Document Revised: 10/16/2019 Document Reviewed: 10/16/2019 Elsevier Patient Education  2021 [employer].

## 2020-10-12 NOTE — Progress Notes (Signed)
Office Visit Note  Patient: Derek Owen             Date of Birth: 1965-10-27           MRN: [id number]             PCP: Noreene Larsson, NP Referring: Noreene Larsson, NP Visit Date: 10/12/2020 Occupation: Truck driver  Subjective:  New Patient (Initial Visit) (Patient complains of left wrist and left knee pain. )   History of Present Illness: Derek Owen is a 54 y.o. male with a history of gout, T2DM, CVA here for evaluation of joint pains with elevated RBC count, CRP, ANA, and RF. His current problems started about 2 months ago with increased pain in the hand, wrist, and knees. He has not improved much with use of colchicine and had less than 1 week improvement with intraarticular steroids. He saw Dr. 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  Labs reviewed 09/2020 RF 66.2 ESR 20 Uric acid 6.1  Imaging reviewed 09/2020 Xray left knee X-ray of the left knee was obtained in clinic todayanddemonstrates moderate degenerative changes overall. Near complete loss of joint space within the medial compartment. Osteophytes are appreciated in all 3 compartments. No acute injury.  Impression: Moderate left knee arthritis  Xray left wrist X-ray of the left wrist obtained in clinic today and demonstrates no acute injury. Minimal degenerative changes are noted. No evidence of a previous injury around the ulnar styloid.  Impression: Normal left wrist.  Activities of Daily Living:  Patient reports morning stiffness for 24 hours.   Patient Reports nocturnal pain.  Difficulty dressing/grooming: Reports Difficulty climbing stairs: Reports Difficulty getting out of chair: Reports Difficulty using hands for taps, buttons, cutlery, and/or writing: Reports  Review of Systems   Constitutional: Positive for fatigue.  HENT: Negative for mouth sores, mouth dryness and nose dryness.   Eyes: Negative for pain, itching, visual disturbance and dryness.  Respiratory: Negative for cough, hemoptysis, shortness of breath and difficulty breathing.   Cardiovascular: Negative for chest pain, palpitations and swelling in legs/feet.  Gastrointestinal: Positive for constipation. Negative for abdominal pain, blood in stool and diarrhea.  Endocrine: Negative for increased urination.  Genitourinary: Negative for painful urination.  Musculoskeletal: Positive for arthralgias, joint pain and morning stiffness. Negative for joint swelling, myalgias, muscle weakness, muscle tenderness and myalgias.  Skin: Negative for color change, rash and redness.  Allergic/Immunologic: Negative for susceptible to infections.  Neurological: Negative for dizziness, numbness, headaches, memory loss and weakness.  Hematological: Negative for swollen glands.  Psychiatric/Behavioral: Negative for confusion and sleep disturbance.    PMFS History:  Patient Active Problem List   Diagnosis Date Noted  . High risk medication use 10/13/2020  . Pain in left wrist 10/12/2020  . 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 Left knee pain 10/08/2020  . Immunization due 10/08/2020  . Gout 08/31/2020  . Pain in right knee 01/23/2018    Past Medical History:  Diagnosis Date  . Alcohol use disorder, mild, abuse   . Cigarette smoker   . Diabetes mellitus without complication ([place])   . Hyperlipidemia   . Hypertension   . Polycythemia 04/13/2020  . Stroke [facility])    x3    Family History  Adopted: Yes   History reviewed. No  pertinent surgical history. Social History   Social History Narrative  . Not on file   Immunization History  Administered Date(s) Administered  . Tdap 10/08/2020     Objective: Vital Signs: BP (!) 168/99 (BP Location: Right Arm, Patient Position: Sitting,  Cuff Size: Large)   Pulse (!) 116   Resp 18   Ht 5' 11.75" (1.822 m)   Wt (!) 307 lb (139.3 kg)   BMI 41.93 kg/m    Physical Exam Constitutional:      Appearance: He is obese.  HENT:     Right Ear: External ear normal.     Left Ear: External ear normal.  Eyes:     Conjunctiva/sclera: Conjunctivae normal.  Skin:    General: Skin is warm and dry.     Findings: No rash.  Neurological:     General: No focal deficit present.     Mental Status: He is alert.  Psychiatric:        Mood and Affect: Mood normal.     Musculoskeletal Exam:  Neck full ROM no tenderness Shoulders full ROM no tenderness or swelling Elbows ROM is reduced about 160 degrees b/l Wrists right full ROM no tenderness or swelling, left with stiffness and very painful over ulnar head with flexion and extension and with direct pressure on lateral side Fingers full ROM no tenderness or swelling Knees bilateral crepitus and tenderness left side is warm to touch and more painful Ankles full ROM no tenderness or swelling MTPs negative squeeze test  POCUS limited inspection demonstrates no appreciable wrist effusion, no calcifications or   Investigation: No additional findings.  Imaging: DG Wrist Complete Left  Result Date: 09/30/2020 X-ray of the left wrist obtained in clinic today and demonstrates no acute injury.  Minimal degenerative changes are noted.  No evidence of a previous injury around the ulnar styloid.  Impression: Normal left wrist.  DG Knee 4 Views W/Patella Left  Result Date: 09/30/2020 X-ray of the left knee was obtained in clinic today and demonstrates moderate degenerative changes overall.  Near complete loss of joint space within the medial compartment.  Osteophytes are appreciated in all 3 compartments.  No acute injury.  Impression: Moderate left knee arthritis   Recent Labs: Lab Results  Component Value Date   WBC 8.8 10/08/2020   HGB 14.0 10/08/2020   PLT 267 10/08/2020   NA 135  10/08/2020   K 4.4 10/08/2020   CL 96 10/08/2020   CO2 20 10/08/2020   GLUCOSE 147 (H) 10/08/2020   BUN 23 10/08/2020   CREATININE 1.04 10/08/2020   BILITOT 0.5 09/21/2020   ALKPHOS 108 09/21/2020   AST 7 09/21/2020   ALT 13 09/21/2020   PROT 7.3 09/21/2020   ALBUMIN 4.2 09/21/2020   CALCIUM 9.8 10/08/2020   GFRAA 101 06/01/2020    Speciality Comments: No specialty comments available.  Procedures:  No procedures performed Allergies: Patient has no known allergies.   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 Imaging shows a lot of knee OA but no specific inflammatory changes. Will repeat inflammatory markers, CCP Ab titer. Would also be able to trial steroids or DMARD medication although if a lot of symptoms are from existing joint damage may  not have a large response to treatment.  High risk medication use  Baseline labs CBC and CMP checked recently were okay, has microcytosis with increased RBC count normal total hemoglobin. Checking hepatitis panel today possible cause of positive RF and for considering trial of DMARD treatment.  Orders: Orders Placed This Encounter  Procedures  . Sedimentation rate  . Hepatitis panel, acute  . Cyclic citrul peptide antibody, IgG   No orders of the defined types were placed in this encounter.   Follow-Up Instructions: No follow-ups on file.   Collier Salina, MD  Note - This record has been created using [employer].  Chart creation errors have been sought, but may not always  have been located. Such creation errors do not reflect on  the standard of medical care.

## 2020-10-13 ENCOUNTER — Telehealth: Payer: Self-pay

## 2020-10-13 ENCOUNTER — Other Ambulatory Visit: Payer: Self-pay

## 2020-10-13 DIAGNOSIS — Z79899 Other long term (current) drug therapy: Secondary | ICD-10-CM | POA: Insufficient documentation

## 2020-10-13 DIAGNOSIS — E1165 Type 2 diabetes mellitus with hyperglycemia: Secondary | ICD-10-CM

## 2020-10-13 LAB — CYCLIC CITRUL PEPTIDE ANTIBODY, IGG: Cyclic Citrullin Peptide Ab: 225 UNITS — ABNORMAL HIGH

## 2020-10-13 LAB — HEPATITIS PANEL, ACUTE
Hep A IgM: NONREACTIVE
Hep B C IgM: NONREACTIVE
Hepatitis B Surface Ag: NONREACTIVE
Hepatitis C Ab: NONREACTIVE
SIGNAL TO CUT-OFF: 0.03 (ref <1.00)

## 2020-10-13 LAB — SEDIMENTATION RATE: Sed Rate: 11 mm/h (ref 0–20)

## 2020-10-13 MED ORDER — GLIPIZIDE 10 MG PO TABS: 10.0000 mg | ORAL_TABLET | Freq: Every day | ORAL | 1 refills | Status: DC

## 2020-10-13 NOTE — Telephone Encounter (Signed)
Patient called need med refill glipiZIDE (GLUCOTROL) 10 MG tablet Pharmacy: [facility] [place]

## 2020-10-13 NOTE — Telephone Encounter (Signed)
Rx sent in

## 2020-10-14 ENCOUNTER — Encounter: Payer: Self-pay | Admitting: Nurse Practitioner

## 2020-10-14 ENCOUNTER — Telehealth: Payer: Self-pay

## 2020-10-14 ENCOUNTER — Other Ambulatory Visit: Payer: Self-pay

## 2020-10-14 ENCOUNTER — Ambulatory Visit: Payer: Managed Care, Other (non HMO) | Admitting: Nurse Practitioner

## 2020-10-14 DIAGNOSIS — M0580 Other rheumatoid arthritis with rheumatoid factor of unspecified site: Secondary | ICD-10-CM | POA: Diagnosis not present

## 2020-10-14 MED ORDER — FOLIC ACID 1 MG PO TABS: 1.0000 mg | ORAL_TABLET | Freq: Every day | ORAL | 0 refills | Status: DC

## 2020-10-14 MED ORDER — METHOTREXATE 2.5 MG PO TABS
15.0000 mg | ORAL_TABLET | ORAL | 0 refills | Status: DC
Start: 2020-10-14 — End: 2020-11-02

## 2020-10-14 MED ORDER — METHYLPREDNISOLONE ACETATE 80 MG/ML IJ SUSP
80.0000 mg | Freq: Once | INTRAMUSCULAR | Status: AC
  Administered 2020-10-14: 80 mg via INTRAMUSCULAR

## 2020-10-14 NOTE — Assessment & Plan Note (Signed)
-  follow up with rheumatology -IM depomedrol today -if he is unable to get in with rheumatology, will consider PO prednisone daily until rheumatology takes over

## 2020-10-14 NOTE — Progress Notes (Signed)
Acute Office Visit  Subjective:    Patient ID: Derek Owen, male    DOB: 12/11/1965, 55 y.o.   MRN: [id number]  Chief Complaint  Patient presents with  . Joint Swelling    Joint pain in both hands, hips, and both knees ongoing for a few months now that has worsened.    HPI Patient is in today for polyarthralgia that has been ongoing for several weeks, but it is worse today.  He has seen rheumatology and had recent labs, and likely has an autoimmune condition.  Past Medical History:  Diagnosis Date  . Alcohol use disorder, mild, abuse   . Cigarette smoker   . Diabetes mellitus without complication ([place])   . Hyperlipidemia   . Hypertension   . Polycythemia 04/13/2020  . Stroke [facility])    x3    History reviewed. No pertinent surgical history.  Family History  Adopted: Yes    Social History   Socioeconomic History  . Marital status: Single    Spouse name: Not on file  . Number of children: Not on file  . Years of education: Not on file  . Highest education level: Not on file  Occupational History  . Not on file  Tobacco Use  . Smoking status: Current Every Day Smoker    Packs/day: 1.00    Types: Cigarettes  . Smokeless tobacco: Never Used  Vaping Use  . Vaping Use: Never used  Substance and Sexual Activity  . Alcohol use: Yes  . Drug use: Not Currently  . Sexual activity: Not Currently    Birth control/protection: Other-see comments  Other Topics Concern  . Not on file  Social History Narrative  . Not on file   Social Determinants of Health   Financial Resource Strain: Not on file  Food Insecurity: Not on file  Transportation Needs: Not on file  Physical Activity: Not on file  Stress: Not on file  Social Connections: Not on file  Intimate Partner Violence: Not on file    Outpatient Medications Prior to Visit  Medication Sig Dispense Refill  . acetaminophen (TYLENOL) 500 MG tablet Take 1 tablet (500 mg total) by mouth every 6 (six) hours as  needed. 30 tablet 0  . allopurinol (ZYLOPRIM) 300 MG tablet Take 0.5 tablets (150 mg total) by mouth daily for 14 days, THEN 1 tablet (300 mg total) daily. Start 1 week after gout flare resolves.. 83 tablet 0  . amLODipine (NORVASC) 10 MG tablet Take 10 mg by mouth daily.    Marland Kitchen aspirin 81 MG chewable tablet Chew by mouth daily.    Marland Kitchen atorvastatin (LIPITOR) 40 MG tablet Take 40 mg by mouth daily.    . colchicine 0.6 MG tablet Take 1 tablet (0.6 mg total) by mouth daily as needed. Take 2 tabs now and 1 tablet in 2 hours if symptoms persist 6 tablet 0  . dapagliflozin propanediol (FARXIGA) 10 MG TABS tablet Take 1 tablet (10 mg total) by mouth daily before breakfast. 90 tablet 1  . diclofenac Sodium (VOLTAREN) 1 % GEL Apply 2 g topically 4 (four) times daily. 150 g 1  . glipiZIDE (GLUCOTROL) 10 MG tablet Take 1 tablet (10 mg total) by mouth daily before breakfast. 90 tablet 1  . ibuprofen (ADVIL) 800 MG tablet Take 1 tablet (800 mg total) by mouth every 8 (eight) hours as needed. 30 tablet 0  . losartan-hydrochlorothiazide (HYZAAR) 100-25 MG tablet Take 1 tablet by mouth daily.    . sitaGLIPtin (  JANUVIA) 100 MG tablet Take 100 mg by mouth daily.     No facility-administered medications prior to visit.    No Known Allergies  Review of Systems  Constitutional: Negative.   Respiratory: Negative.   Cardiovascular: Negative.   Musculoskeletal: Positive for arthralgias.       Objective:    Physical Exam Constitutional:      Appearance: Normal appearance. He is obese.  Cardiovascular:     Rate and Rhythm: Normal rate and regular rhythm.     Pulses: Normal pulses.     Heart sounds: Normal heart sounds.  Pulmonary:     Effort: Pulmonary effort is normal.     Breath sounds: Normal breath sounds.  Musculoskeletal:        General: Tenderness present.     Comments: Pain to multiple joints including fingers, wrists; unable to grip his truck's steering wheel d/t pain  Neurological:     Mental  Status: He is alert.     BP (!) 168/90 (BP Location: Right Arm, Patient Position: Sitting, Cuff Size: Normal)   Pulse 89   Temp (!) 97.5 F (36.4 C) (Temporal)   Ht 6\' 1"  (1.854 m)   Wt (!) 310 lb (140.6 kg)   SpO2 97%   BMI 40.90 kg/m  Wt Readings from Last 3 Encounters:  10/14/20 (!) 310 lb (140.6 kg)  10/12/20 (!) 307 lb (139.3 kg)  10/08/20 (!) 305 lb (138.3 kg)    There are no preventive care reminders to display for this patient.  There are no preventive care reminders to display for this patient.   No results found for: TSH Lab Results  Component Value Date   WBC 8.8 10/08/2020   HGB 14.0 10/08/2020   HCT 45.2 10/08/2020   MCV 69 (L) 10/08/2020   PLT 267 10/08/2020   Lab Results  Component Value Date   NA 135 10/08/2020   K 4.4 10/08/2020   CO2 20 10/08/2020   GLUCOSE 147 (H) 10/08/2020   BUN 23 10/08/2020   CREATININE 1.04 10/08/2020   BILITOT 0.5 09/21/2020   ALKPHOS 108 09/21/2020   AST 7 09/21/2020   ALT 13 09/21/2020   PROT 7.3 09/21/2020   ALBUMIN 4.2 09/21/2020   CALCIUM 9.8 10/08/2020   Lab Results  Component Value Date   CHOL 142 06/01/2020   Lab Results  Component Value Date   HDL 49 06/01/2020   Lab Results  Component Value Date   LDLCALC 77 06/01/2020   Lab Results  Component Value Date   TRIG 83 06/01/2020   No results found for: CHOLHDL Lab Results  Component Value Date   HGBA1C 6.9 (H) 06/01/2020       Assessment & Plan:   Problem List Items Addressed This Visit      Musculoskeletal and Integument   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    Noreene Larsson, NP

## 2020-10-14 NOTE — Telephone Encounter (Signed)
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  Patient states he called his PCP Dr. Pearline Cables and they worked him in and gave him an injection.  Patient requested a return call to discuss labwork results.

## 2020-10-14 NOTE — Addendum Note (Signed)
Addended by: Collier Salina on: 10/14/2020 04:28 PM   Modules accepted: Orders

## 2020-10-14 NOTE — Addendum Note (Signed)
Addended by: Laretta Bolster on: 10/14/2020 10:48 AM   Modules accepted: Orders

## 2020-10-14 NOTE — Telephone Encounter (Signed)
I spoke with Derek Owen. We will try starting methotrexate for seropositive rheumatoid arthritis and sending prescription to pharmacy today. He has a prescription of prednisone on hand from another provider I recommended he can try taking this as well for initial symptom treatment. Cautioned regarding risk of hyperglycemia on this. He needs follow up appointment scheduled for 2-3 weeks from now for repeat labs after starting the methotrexate.

## 2020-10-15 ENCOUNTER — Telehealth: Payer: Self-pay

## 2020-10-15 NOTE — Telephone Encounter (Signed)
PLEASE call [facility] in [place], pt states that he is out of glipizide, per the Dr he is suppose to take 2 a day , the pharmacy has it wrong

## 2020-10-16 NOTE — Telephone Encounter (Signed)
Pt informed

## 2020-10-21 NOTE — Progress Notes (Signed)
[place] [place], [place] [address]   CLINIC:  Medical Oncology/Hematology  CONSULT NOTE  Patient Care Team: Derek Larsson, NP as PCP - General (Nurse Practitioner)  CHIEF COMPLAINTS/PURPOSE OF CONSULTATION:  Microcytosis with elevated RBC count  HISTORY OF PRESENTING ILLNESS:  Derek Owen 55 y.o. male is here at the request of his primary care provider Derek Revel, NP) due to abnormal labs.  He was noted to have elevated RBC 6.51, but with normal hemoglobin 14.0 and MCV 69.  Therefore, he was referred for further insight.  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  Rheumatologist has started him on methotrexate and prednisone.  His chief concern is his joint pain today.  This has been ongoing for the past two months, and is currently being worked up by orthopedics and rheumatology.  There was mention of possible rheumatoid arthritis, and patient will likely be started on methotrexate and prednisone in the near future.    Patient has no other complaints today, states that he is otherwise doing well.   He denies any B symptoms.  Denies any nausea, vomiting, or diarrhea. No new neurologic symptoms such as new-onset hearing loss, blurred vision, headache, or dizziness.  No known history of thromboembolic events. Had not noticed any recent bleeding such as epistaxis, hematuria or hematochezia. Denies recent chest pain on exertion, shortness of breath on minimal exertion, pre-syncopal episodes, or palpitations. Denies any numbness or tingling in hands or feet. Denies any recent fevers, infections, or recent hospitalizations. No new masses or lymphadenopathy per his report. Patient reports appetite at 100% and energy level at 80%. He  is maintaining stable weight at this time.  [name] past medical history is otherwise significant for diabetes mellitus, hypertension, and other conditions  noted elsewhere in the medical record.   Patient is currently employed as a Administrator.  He smokes 1 pack/day of cigarettes.  He reports a history of excessive drinking, reports that he stopped cold [place] 2 months ago.  He denies illicit drug use.  The patient's family history is unknown, as he was adopted.  MEDICAL HISTORY:  Past Medical History:  Diagnosis Date  . Alcohol use disorder, mild, abuse   . Cigarette smoker   . Diabetes mellitus without complication ([place])   . Hyperlipidemia   . Hypertension   . Polycythemia 04/13/2020  . Stroke [facility])    x3    SURGICAL HISTORY: No past surgical history on file.  SOCIAL HISTORY: Social History   Socioeconomic History  . Marital status: Single    Spouse name: Not on file  . Number of children: Not on file  . Years of education: Not on file  . Highest education level: Not on file  Occupational History  . Not on file  Tobacco Use  . Smoking status: Current Every Day Smoker    Packs/day: 1.00    Types: Cigarettes  . Smokeless tobacco: Never Used  Vaping Use  . Vaping Use: Never used  Substance and Sexual Activity  . Alcohol use: Not Currently  . Drug use: Not Currently  . Sexual activity: Not Currently    Birth control/protection: Other-see comments  Other Topics Concern  . Not on file  Social History Narrative  . Not on file   Social Determinants of Health   Financial Resource Strain: Not on file  Food Insecurity: Not on file  Transportation Needs: Not  on file  Physical Activity: Not on file  Stress: Not on file  Social Connections: Not on file  Intimate Partner Violence: Not on file    FAMILY HISTORY: Family History  Adopted: Yes    ALLERGIES:  has No Known Allergies.  MEDICATIONS:  Current Outpatient Medications  Medication Sig Dispense Refill  . acetaminophen (TYLENOL) 500 MG tablet Take 1 tablet (500 mg total) by mouth every 6 (six) hours as needed. 30 tablet 0  . allopurinol (ZYLOPRIM) 300 MG  tablet Take 0.5 tablets (150 mg total) by mouth daily for 14 days, THEN 1 tablet (300 mg total) daily. Start 1 week after gout flare resolves.. (Patient not taking: Reported on 11/02/2020) 83 tablet 0  . amLODipine (NORVASC) 10 MG tablet Take 10 mg by mouth daily.    Marland Kitchen aspirin 81 MG chewable tablet Chew by mouth daily.    Marland Kitchen atorvastatin (LIPITOR) 40 MG tablet Take 40 mg by mouth daily.    . colchicine 0.6 MG tablet Take 1 tablet (0.6 mg total) by mouth daily as needed. Take 2 tabs now and 1 tablet in 2 hours if symptoms persist 6 tablet 0  . dapagliflozin propanediol (FARXIGA) 10 MG TABS tablet Take 1 tablet (10 mg total) by mouth daily before breakfast. 90 tablet 1  . diclofenac Sodium (VOLTAREN) 1 % GEL Apply 2 g topically 4 (four) times daily. 081 g 1  . folic acid (FOLVITE) 1 MG tablet Take 1 tablet (1 mg total) by mouth daily. 90 tablet 0  . glipiZIDE (GLUCOTROL) 10 MG tablet Take 1 tablet (10 mg total) by mouth daily before breakfast. 90 tablet 1  . ibuprofen (ADVIL) 800 MG tablet Take 1 tablet (800 mg total) by mouth every 8 (eight) hours as needed. 30 tablet 0  . losartan-hydrochlorothiazide (HYZAAR) 100-25 MG tablet Take 1 tablet by mouth daily.    . sitaGLIPtin (JANUVIA) 100 MG tablet Take 100 mg by mouth daily.    . methotrexate (RHEUMATREX) 2.5 MG tablet Take 6 tablets (15 mg total) by mouth once a week. Caution:Chemotherapy. Protect from light. 72 tablet 0  . predniSONE (DELTASONE) 5 MG tablet Take 2 tablets (10 mg total) by mouth daily for 21 days, THEN 1 tablet (5 mg total) daily for 21 days. 63 tablet 0   No current facility-administered medications for this visit.    REVIEW OF SYSTEMS:   Review of Systems  Constitutional: Negative for appetite change, chills, diaphoresis, fatigue, fever and unexpected weight change.  HENT:   Negative for lump/mass and nosebleeds.   Eyes: Negative for eye problems.  Respiratory: Negative for cough, hemoptysis and shortness of breath.    Cardiovascular: Negative for chest pain, leg swelling and palpitations.  Gastrointestinal: Negative for abdominal pain, blood in stool, constipation, diarrhea, nausea and vomiting.  Genitourinary: Negative for hematuria.   Musculoskeletal: Positive for arthralgias.  Skin: Negative.   Neurological: Negative for dizziness, headaches and light-headedness.  Hematological: Does not bruise/bleed easily.      PHYSICAL EXAMINATION: ECOG PERFORMANCE STATUS: 1 - Symptomatic but completely ambulatory  Vitals:   10/22/20 1040  BP: (!) 145/96  Pulse: (!) 101  Resp: 19  Temp: (!) 97.4 F (36.3 C)  SpO2: 100%   Filed Weights   10/22/20 1040  Weight: (!) 301 lb 4.8 oz (136.7 kg)    Physical Exam Constitutional:      Appearance: Normal appearance. He is obese.  HENT:     Head: Normocephalic and atraumatic.     Mouth/Throat:  Mouth: Mucous membranes are moist.  Eyes:     Extraocular Movements: Extraocular movements intact.     Pupils: Pupils are equal, round, and reactive to light.  Cardiovascular:     Rate and Rhythm: Normal rate and regular rhythm.     Pulses: Normal pulses.     Heart sounds: Normal heart sounds.  Pulmonary:     Effort: Pulmonary effort is normal.     Breath sounds: Normal breath sounds.  Abdominal:     General: Bowel sounds are normal.     Palpations: Abdomen is soft.     Tenderness: There is no abdominal tenderness.  Musculoskeletal:        General: No swelling.     Right lower leg: No edema.     Left lower leg: No edema.  Lymphadenopathy:     Cervical: No cervical adenopathy.  Skin:    General: Skin is warm and dry.  Neurological:     General: No focal deficit present.     Mental Status: He is alert and oriented to person, place, and time.  Psychiatric:        Mood and Affect: Mood normal.        Behavior: Behavior normal.       LABORATORY DATA:  I have reviewed the data as listed Recent Results (from the past 2160 hour(s))  CBC with  Differential/Platelet     Status: Abnormal   Collection Time: 09/21/20  9:48 AM  Result Value Ref Range   WBC 7.5 3.4 - 10.8 x10E3/uL   RBC 6.20 (H) 4.14 - 5.80 x10E6/uL   Hemoglobin 13.7 13.0 - 17.7 g/dL   Hematocrit 43.7 37.5 - 51.0 %   MCV 71 (L) 79 - 97 fL   MCH 22.1 (L) 26.6 - 33.0 pg   MCHC 31.4 (L) 31.5 - 35.7 g/dL   RDW 17.4 (H) 11.6 - 15.4 %   Platelets 266 150 - 450 x10E3/uL   Neutrophils 69 Not Estab. %   Lymphs 16 Not Estab. %   Monocytes 12 Not Estab. %   Eos 1 Not Estab. %   Basos 1 Not Estab. %   Neutrophils Absolute 5.2 1.4 - 7.0 x10E3/uL   Lymphocytes Absolute 1.2 0.7 - 3.1 x10E3/uL   Monocytes Absolute 0.9 0.1 - 0.9 x10E3/uL   EOS (ABSOLUTE) 0.1 0.0 - 0.4 x10E3/uL   Basophils Absolute 0.1 0.0 - 0.2 x10E3/uL   Immature Granulocytes 1 Not Estab. %   Immature Grans (Abs) 0.1 0.0 - 0.1 x10E3/uL  CMP14+EGFR     Status: Abnormal   Collection Time: 09/21/20  9:48 AM  Result Value Ref Range   Glucose 246 (H) 65 - 99 mg/dL   BUN 18 6 - 24 mg/dL   Creatinine, Ser 1.01 0.76 - 1.27 mg/dL   eGFR 87 >59 mL/min/1.73   BUN/Creatinine Ratio 18 9 - 20   Sodium 139 134 - 144 mmol/L   Potassium 4.0 3.5 - 5.2 mmol/L   Chloride 99 96 - 106 mmol/L   CO2 24 20 - 29 mmol/L   Calcium 9.4 8.7 - 10.2 mg/dL   Total Protein 7.3 6.0 - 8.5 g/dL   Albumin 4.2 3.8 - 4.9 g/dL   Globulin, Total 3.1 1.5 - 4.5 g/dL   Albumin/Globulin Ratio 1.4 1.2 - 2.2   Bilirubin Total 0.5 0.0 - 1.2 mg/dL   Alkaline Phosphatase 108 44 - 121 IU/L   AST 7 0 - 40 IU/L   ALT 13 0 - 44  IU/L  Uric acid     Status: None   Collection Time: 09/21/20  9:48 AM  Result Value Ref Range   Uric Acid 6.1 3.8 - 8.4 mg/dL    Comment:            Therapeutic target for gout patients: <6.0  Iron and TIBC     Status: Abnormal   Collection Time: 09/21/20  9:48 AM  Result Value Ref Range   Total Iron Binding Capacity 309 250 - 450 ug/dL   UIBC 269 111 - 343 ug/dL   Iron 40 38 - 169 ug/dL   Iron Saturation 13 (L) 15 -  55 %  Ferritin     Status: Abnormal   Collection Time: 09/21/20  9:48 AM  Result Value Ref Range   Ferritin 513 (H) 30 - 400 ng/mL  Specimen status report     Status: None   Collection Time: 09/21/20  9:48 AM  Result Value Ref Range   specimen status report Comment     Comment: Written Authorization Written Authorization Written Authorization Received. Authorization received from [place] 09-22-2020 Logged by Marcene Duos   Rheumatoid factor     Status: Abnormal   Collection Time: 10/06/20 12:49 PM  Result Value Ref Range   Rhuematoid fact SerPl-aCnc 66.2 (H) <14.0 IU/mL    Comment: (NOTE) Performed At: [facility] [place], [place] [id number] Rush Farmer MD [id number]   Sedimentation rate     Status: Abnormal   Collection Time: 10/06/20 12:49 PM  Result Value Ref Range   Sed Rate 20 (H) 0 - 16 mm/hr    Comment: Performed at [facility], [address] [place], [place] [address]  Antinuclear Antib (ANA)     Status: Abnormal   Collection Time: 10/08/20  9:23 AM  Result Value Ref Range   Anti Nuclear Antibody (ANA) Positive (A) Negative  C-reactive protein     Status: Abnormal   Collection Time: 10/08/20  9:23 AM  Result Value Ref Range   CRP 43 (H) 0 - 10 mg/L  CBC with Differential/Platelet     Status: Abnormal   Collection Time: 10/08/20  9:23 AM  Result Value Ref Range   WBC 8.8 3.4 - 10.8 x10E3/uL   RBC 6.51 (H) 4.14 - 5.80 x10E6/uL   Hemoglobin 14.0 13.0 - 17.7 g/dL   Hematocrit 45.2 37.5 - 51.0 %   MCV 69 (L) 79 - 97 fL   MCH 21.5 (L) 26.6 - 33.0 pg   MCHC 31.0 (L) 31.5 - 35.7 g/dL   RDW 17.9 (H) 11.6 - 15.4 %   Platelets 267 150 - 450 x10E3/uL   Neutrophils 71 Not Estab. %   Lymphs 14 Not Estab. %   Monocytes 12 Not Estab. %   Eos 1 Not Estab. %   Basos 1 Not Estab. %   Neutrophils Absolute 6.3 1.4 - 7.0 x10E3/uL   Lymphocytes Absolute 1.3 0.7 - 3.1 x10E3/uL   Monocytes Absolute 1.1 (H) 0.1 - 0.9 x10E3/uL   EOS  (ABSOLUTE) 0.1 0.0 - 0.4 x10E3/uL   Basophils Absolute 0.1 0.0 - 0.2 x10E3/uL   Immature Granulocytes 1 Not Estab. %   Immature Grans (Abs) 0.1 0.0 - 0.1 P50D3/OI  Basic metabolic panel     Status: Abnormal   Collection Time: 10/08/20  9:23 AM  Result Value Ref Range   Glucose 147 (H) 65 - 99 mg/dL   BUN 23 6 - 24 mg/dL   Creatinine,  Ser 1.04 0.76 - 1.27 mg/dL   eGFR 84 >59 mL/min/1.73   BUN/Creatinine Ratio 22 (H) 9 - 20   Sodium 135 134 - 144 mmol/L   Potassium 4.4 3.5 - 5.2 mmol/L   Chloride 96 96 - 106 mmol/L   CO2 20 20 - 29 mmol/L   Calcium 9.8 8.7 - 10.2 mg/dL  Uric acid     Status: None   Collection Time: 10/08/20  9:23 AM  Result Value Ref Range   Uric Acid 5.8 3.8 - 8.4 mg/dL    Comment:            Therapeutic target for gout patients: <6.0  Sedimentation rate     Status: None   Collection Time: 10/12/20 11:15 AM  Result Value Ref Range   Sed Rate 11 0 - 20 mm/h  Hepatitis panel, acute     Status: None   Collection Time: 10/12/20 11:15 AM  Result Value Ref Range   Hep A IgM NON-REACTIVE NON-REACTI   Hepatitis B Surface Ag NON-REACTIVE NON-REACTI   Hep B C IgM NON-REACTIVE NON-REACTI   Hepatitis C Ab NON-REACTIVE NON-REACTI   SIGNAL TO CUT-OFF 0.03 <1.00    Comment: . HCV antibody was non-reactive. There is no laboratory  evidence of HCV infection. . In most cases, no further action is required. However, if recent HCV exposure is suspected, a test for HCV RNA (test code [phone]) is suggested. . For additional information please refer to http://education.questdiagnostics.com/faq/FAQ22v1 (This link is being provided for informational/ educational purposes only.) . Marland Kitchen For additional information, please refer to  http://education.questdiagnostics.com/faq/FAQ202  (This link is being provided for informational/ educational purposes only.) .   Cyclic citrul peptide antibody, IgG     Status: Abnormal   Collection Time: 10/12/20 11:15 AM  Result Value Ref Range    Cyclic Citrullin Peptide Ab 225 (H) UNITS    Comment: Reference Range Negative:            <20 Weak Positive:       20-39 Moderate Positive:   40-59 Strong Positive:     >59 .   Hemochromatosis DNA-PCR(c282y,h63d)     Status: None   Collection Time: 10/22/20 12:29 PM  Result Value Ref Range   DNA Mutation Analysis Comment     Comment: (NOTE) Result: c.845G>A (p.Cys282Tyr) - Not Detected c.187C>G (p.His63Asp) - Not Detected c.193A>T (p.Ser65Cys) - Not Detected Not associated with increased risk to develop clinical symptoms of Hereditary Hemochromatosis. In symptomatic individuals, other causes of iron overload should be evaluated. See Additional Information and Comments. Additional Clinical Information: Hereditary hemochromatosis (HFE related) is an autosomal recessive iron storage disorder. Patients may have a genetic diagnosis of hereditary hemochromatosis and never show clinical symptoms. Clinical symptoms typically appear between 44 to 19 years in males and after menopause in females. Signs and symptoms may include organ damage, primarily in the liver, risk for hepatocellular carcinoma, diabetes, and heart disease due to iron accumulation. Life expectancy may be decreased in individuals who develop cirrhosis. Treatment for clinically symptomatic individuals may include therapeutic phlebotomy. L iver transplant may be used to treat end stage liver failure. For preventive care, monitoring for iron overload is recommended for patients who are homozygous for c.845G>A (p.Cys282Tyr) and have yet to experience clinical symptoms. Comments: The most common HFE variants associated with hereditary hemochromatosis are c.845G>A (p.Cys282Tyr), c.187C>G (p.His63Asp), c.193A>T (p.Ser65Cys). While patients homozygous for c.845G>A (p.Cys282Tyr) are the most likely to present clinical symptoms, less than 10% develop clinically significant iron  overload with tissue and organ damage. Genetic  counseling is recommended to discuss the potential clinical implications of positive results, as well as recommendations for testing family members. Genetic Coordinators are available for health care providers to discuss results at [phone]). Test Details: Three variants analyzed: c.845G>A (p.Cys282Tyr), commonly referred to as C282Y c.187C>G (p.His63Asp), commonly referred to a s H63D c.193A>T (p.Ser65Cys), commonly referred to as S65C Methods/Limitations: DNA Analysis of the HFE gene (NM_[id number]) was performed by PCR amplification followed by restriction enzyme digestion analyses. Results must be combined with clinical information for the most accurate interpretation. Molecular-based testing is highly accurate, but as in any laboratory test, diagnostic errors may occur. False positive or false negative results may occur for reasons that include genetic variants, blood transfusions, bone marrow transplantation, somatic or tissue-specific mosaicism, mislabeled samples, or erroneous representation of family relationships. This test was developed and its performance characteristics determined by [facility]. It has not been cleared or approved by the Food and Drug Administration. References: Ula Lingo, [address] [place] Milagros Reap [place], [place]; American Association for the Study of Liver Diseases. Diagnosis and management of hemochromatosis: 2011  practice guideline by the American Association for the Study of Liver Diseases. Hepatology. 2011 Jul;54(1):328-43. doi: 10.1002/hep.24330. PMID: 10175102; PMCID: HEN2778242. [address], Brissot P, Swinkels DW, [employer], Kamarainen O, Patton S, Alonso I, Morris M, Evan best practice guidelines for the molecular genetic diagnosis of hereditary hemochromatosis [facility]). Eur J Hum Genet. 2016 Apr;24(4):479-95. doi: 10.1038/ejhg.2015.128. Epub 2015 Jul 8. PMID: 35361443; PMCID: XVQ0086761. Ruben Reason, PhD, [facility] Earlean Polka, PhD, [facility] Fannie Knee, PhD, [facility] Threasa Alpha, PhD, [facility] Ileene Hutchinson, PhD, [facility] Lubertha South, PhD, [facility] Alfredo Bach, PhD, [facility] Performed At: [facility] [address] [place], [place] [id number] Katina Degree MDPhD IW:[id number]   Hgb Fractionation Cascade     Status: None   Collection Time: 10/22/20 12:56 PM  Result Value Ref Range   Hgb F 0.0 0.0 - 2.0 %   Hgb A 97.7 96.4 - 98.8 %   Hgb A2 2.3 1.8 - 3.2 %   Hgb S 0.0 0.0 %   Interpretation, Hgb Fract Comment     Comment: (NOTE) Normal hemoglobin present; no hemoglobin variant or beta thalassemia identified. Note: Alpha thalassemia may not be detected by the Hgb Fractionation Cascade panel. If alpha thalassemia is suspected, [facility] offers Alpha-Thalassemia DNA Analysis [phone]). Performed At: [facility] [place], [place] [id number] Rush Farmer MD KW:[id number]   CBC with Differential/Platelet     Status: Abnormal   Collection Time: 11/02/20  2:22 PM  Result Value Ref Range   WBC 9.5 3.8 - 10.8 Thousand/uL   RBC 6.10 (H) 4.20 - 5.80 Million/uL   Hemoglobin 13.3 13.2 - 17.1 g/dL   HCT 43.2 38.5 - 50.0 %   MCV 70.8 (L) 80.0 - 100.0 fL   MCH 21.8 (L) 27.0 - 33.0 pg   MCHC 30.8 (L) 32.0 - 36.0 g/dL   RDW 18.4 (H) 11.0 - 15.0 %   Platelets 279 140 - 400 Thousand/uL   MPV 11.1 7.5 - 12.5 fL   Neutro Abs 7,391 1,500 - 7,800 cells/uL   Lymphs Abs 1,131 850 - 3,900 cells/uL   Absolute Monocytes 893 200 - 950 cells/uL   Eosinophils Absolute 48 15 - 500 cells/uL   Basophils Absolute 38 0 - 200 cells/uL   Neutrophils Relative % 77.8 %   Total Lymphocyte 11.9 %  Monocytes Relative 9.4 %   Eosinophils Relative 0.5 %   Basophils Relative 0.4 %  COMPLETE METABOLIC PANEL WITH GFR     Status: Abnormal   Collection Time: 11/02/20  2:22 PM  Result Value Ref Range   Glucose, Bld 279 (H) 65 - 99 mg/dL    Comment: .            Fasting reference interval . For someone without  known diabetes, a glucose value >125 mg/dL indicates that they may have diabetes and this should be confirmed with a follow-up test. .    BUN 24 7 - 25 mg/dL   Creat 1.25 0.70 - 1.33 mg/dL    Comment: For patients >31 years of age, the reference limit for Creatinine is approximately 13% higher for people identified as African-American. .    GFR, Est Non African American 64 > OR = 60 mL/min/1.48m   GFR, Est African American 74 > OR = 60 mL/min/1.760m  BUN/Creatinine Ratio NOT APPLICABLE 6 - 22 (calc)   Sodium 137 135 - 146 mmol/L   Potassium 4.1 3.5 - 5.3 mmol/L   Chloride 100 98 - 110 mmol/L   CO2 25 20 - 32 mmol/L   Calcium 9.7 8.6 - 10.3 mg/dL   Total Protein 7.2 6.1 - 8.1 g/dL   Albumin 4.1 3.6 - 5.1 g/dL   Globulin 3.1 1.9 - 3.7 g/dL (calc)   AG Ratio 1.3 1.0 - 2.5 (calc)   Total Bilirubin 0.3 0.2 - 1.2 mg/dL   Alkaline phosphatase (APISO) 82 35 - 144 U/L   AST 10 10 - 35 U/L   ALT 16 9 - 46 U/L    RADIOGRAPHIC STUDIES: I have personally reviewed the radiological images as listed and agreed with the findings in the report. No results found.  ASSESSMENT: 1. Microcytosis with elevated RBC -Patient referred by PCP due to abnormal labs: RBC 6.51, but with normal hemoglobin 14.0 and microcytosis (MCV 69) -Suspect that the patient has some aspect of beta thalassemia, but appears to be well compensated and asymptomatic -Family history unknown, as patient was adopted  2. Elevated ferritin -Patient's ferritin was 513 (09/21/2020), but with low serum iron saturation at 13% -Suspect inflammatory sequestration of ferritin as an acute phase reactant, given polyarthritis currently under work-up -ESR checked at about the same time as the ferritin (10/06/2020) was elevated at 20, CRP elevated at 43; patient was positive for ANA and rheumatoid factor -Unknown if patient has any family history of hemochromatosis, as he was adopted  3.  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  He reports a history of excessive drinking, reports that he stopped cold [place] months ago.  He denies illicit drug use. -The patient's family history is unknown, as he was adopted.   PLAN:  1. 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 Elevated ferritin -Check for hereditary hemochromatosis DNA mutations   PLAN SUMMARY & DISPOSITION: -Labs today -RTC in 2 weeks to discuss results  All questions were answered. The patient knows to call the clinic with any problems, questions or concerns.   Medical decision making: Low  Time spent on visit: I spent 25 minutes counseling the patient face to face. The total time spent in the appointment was 45 minutes and more than 50% was on counseling.  I, Tarri Abernethy PA-C, have seen this patient in conjunction with Dr. Derek Jack. Greater than 50% of visit was performed by Dr. Delton Coombes.  Addendum: I have independently evaluated this patient and agree with HPI written by Casey Burkitt, PA-C.  Patient evaluated for microcytosis with no anemia and elevated RBC count.  Family history for sickle cell or thalassemia unknown as the patient is adopted.  He works as a Administrator.  His ferritin was also found to be elevated.  We will check for hemoglobin electrophoresis and mutations for hemochromatosis.  RTC 2 weeks for follow-up.   Derek Jack, MD 11/07/20 1:41 PM

## 2020-10-22 ENCOUNTER — Inpatient Hospital Stay (HOSPITAL_COMMUNITY): Payer: Managed Care, Other (non HMO) | Attending: Hematology | Admitting: Hematology

## 2020-10-22 ENCOUNTER — Other Ambulatory Visit: Payer: Self-pay

## 2020-10-22 ENCOUNTER — Inpatient Hospital Stay (HOSPITAL_COMMUNITY): Payer: Managed Care, Other (non HMO)

## 2020-10-22 VITALS — BP 145/96 | HR 101 | Temp 97.4°F | Resp 19 | Wt 301.3 lb

## 2020-10-22 DIAGNOSIS — D563 Thalassemia minor: Secondary | ICD-10-CM | POA: Diagnosis not present

## 2020-10-22 DIAGNOSIS — R718 Other abnormality of red blood cells: Secondary | ICD-10-CM

## 2020-10-22 DIAGNOSIS — R7989 Other specified abnormal findings of blood chemistry: Secondary | ICD-10-CM

## 2020-10-22 NOTE — Patient Instructions (Signed)
[place] at [facility] Discharge Instructions  You were seen today by Dr. Delton Coombes and Tarri Abernethy PA-C for your abnormal labs. 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    LABS: Labs today before leaving the hospital   OTHER TESTS: None  MEDICATIONS: No changes  FOLLOW-UP APPOINTMENT: Follow up in 2 weeks to discuss results   Thank you for choosing [place] at [facility] to provide your oncology and hematology care.  To afford each patient quality time with our provider, please arrive at least 15 minutes before your scheduled appointment time.   If you have a lab appointment with the [place] please come in thru the Main Entrance and check in at the main information desk.  You need to re-schedule your appointment should you arrive 10 or more minutes late.  We strive to give you quality time with our providers, and arriving late affects you and other patients whose appointments are after yours.  Also, if you no show three or more times for appointments you may be dismissed from the clinic at the providers discretion.     Again, thank you for choosing [facility].  Our hope is that these requests will decrease the amount of time that you wait before being seen by our physicians.       _____________________________________________________________  Should you have questions after your visit to [facility], please contact our office at [phone] and follow the prompts.  Our office hours are 8:00 a.m. and 4:30 p.m. Monday - Friday.  Please note that voicemails left after 4:00 p.m. may not be returned until the following business day.  We are closed weekends and major holidays.  You do have access to a nurse 24-7, just call the main number to the clinic [phone] and do not press any options, hold on the line and a  nurse will answer the phone.    For prescription refill requests, have your pharmacy contact our office and allow 72 hours.    Due to Covid, you will need to wear a mask upon entering the hospital. If you do not have a mask, a mask will be given to you at the Main Entrance upon arrival. For doctor visits, patients may have 1 support person age 45 or older with them. For treatment visits, patients can not have anyone with them due to social distancing guidelines and our immunocompromised population.

## 2020-10-26 LAB — HGB FRACTIONATION CASCADE
Hgb A2: 2.3 % (ref 1.8–3.2)
Hgb A: 97.7 % (ref 96.4–98.8)
Hgb F: 0 % (ref 0.0–2.0)
Hgb S: 0 %

## 2020-10-27 LAB — HEMOCHROMATOSIS DNA-PCR(C282Y,H63D)

## 2020-10-29 ENCOUNTER — Ambulatory Visit: Payer: Managed Care, Other (non HMO) | Admitting: Internal Medicine

## 2020-11-01 NOTE — Progress Notes (Signed)
Office Visit Note  Patient: Derek Owen             Date of Birth: 06-Mar-1966           MRN: [id number]             PCP: Noreene Larsson, NP Referring: Noreene Larsson, NP Visit Date: 11/02/2020   Subjective:  Follow-up (Patient has noticed some improvement in symptoms. )   History of Present Illness: Derek Owen is a 55 y.o. male here for seropositive rheumatoid arthritis on methotrexate 15 mg PO weekly and prednisone 10 mg daily. Since his last visit symptoms are partially improved. He still has some left wrist and knee pain but not much swelling. He stopped taking allopurinol due to suspicion being for RA based on lab findings.    Review of Systems  Constitutional: Negative for fatigue.  HENT: Positive for mouth dryness. Negative for mouth sores and nose dryness.   Eyes: Negative for pain, itching, visual disturbance and dryness.  Respiratory: Negative for cough, hemoptysis, shortness of breath and difficulty breathing.   Cardiovascular: Negative for chest pain, palpitations and swelling in legs/feet.  Gastrointestinal: Negative for abdominal pain, blood in stool, constipation and diarrhea.  Endocrine: Negative for increased urination.  Genitourinary: Negative for painful urination.  Musculoskeletal: Positive for arthralgias, joint pain, myalgias, morning stiffness and myalgias. Negative for joint swelling, muscle weakness and muscle tenderness.  Skin: Negative for color change, rash and redness.  Allergic/Immunologic: Negative for susceptible to infections.  Neurological: Negative for dizziness, numbness, headaches, memory loss and weakness.  Hematological: Negative for swollen glands.  Psychiatric/Behavioral: Negative for confusion and sleep disturbance.    PMFS History:  Patient Active Problem List   Diagnosis Date Noted  . RBC microcytosis 10/22/2020  . High risk medication use 10/13/2020  . Pain in left wrist 10/12/2020  . Polyarthritis with positive rheumatoid  factor ([place]) 10/12/2020  . Left knee pain 10/08/2020  . Immunization due 10/08/2020  . Gout 08/31/2020  . Pain in right knee 01/23/2018    Past Medical History:  Diagnosis Date  . Alcohol use disorder, mild, abuse   . Cigarette smoker   . Diabetes mellitus without complication ([place])   . Hyperlipidemia   . Hypertension   . Polycythemia 04/13/2020  . Stroke [facility])    x3    Family History  Adopted: Yes   History reviewed. No pertinent surgical history. Social History   Social History Narrative  . Not on file   Immunization History  Administered Date(s) Administered  . Tdap 10/08/2020     Objective: Vital Signs: BP 121/76 (BP Location: Left Arm, Patient Position: Sitting, Cuff Size: Normal)   Pulse (!) 109   Ht 6' (1.829 m)   Wt (!) 302 lb 9.6 oz (137.3 kg)   BMI 41.04 kg/m    Physical Exam Constitutional:      Appearance: Normal appearance.  Eyes:     Conjunctiva/sclera: Conjunctivae normal.  Cardiovascular:     Rate and Rhythm: Regular rhythm. Tachycardia present.  Skin:    General: Skin is warm and dry.     Findings: No rash.  Neurological:     General: No focal deficit present.  Psychiatric:        Mood and Affect: Mood normal.    Musculoskeletal Exam:  Shoulders full ROM no tenderness or swelling Elbows full ROM no tenderness or swelling Wrists full ROM no swelling left wrist is tender to pressure and with ROM Fingers  full ROM no tenderness or swelling Knees full ROM left sided joint line tenderness no swelling Ankles full ROM no tenderness or swelling   CDAI Exam: CDAI Score: 8  Patient Global: 40 mm; Provider Global: 20 mm Swollen: 0 ; Tender: 2  Joint Exam 11/02/2020      Right  Left  Wrist      Tender  Knee      Tender     Investigation: No additional findings.  Imaging: No results found.  Recent Labs: Lab Results  Component Value Date   WBC 9.5 11/02/2020   HGB 13.3 11/02/2020   PLT 279 11/02/2020   NA 137 11/02/2020   K  4.1 11/02/2020   CL 100 11/02/2020   CO2 25 11/02/2020   GLUCOSE 279 (H) 11/02/2020   BUN 24 11/02/2020   CREATININE 1.25 11/02/2020   BILITOT 0.3 11/02/2020   ALKPHOS 108 09/21/2020   AST 10 11/02/2020   ALT 16 11/02/2020   PROT 7.2 11/02/2020   ALBUMIN 4.2 09/21/2020   CALCIUM 9.7 11/02/2020   GFRAA 74 11/02/2020    Speciality Comments: No specialty comments available.  Procedures:  No procedures performed Allergies: Patient has no known allergies.   Assessment / Plan:     Visit Diagnoses: Polyarthritis with positive rheumatoid factor ([place]) - Plan: predniSONE (DELTASONE) 5 MG tablet, methotrexate (RHEUMATREX) 2.5 MG tablet  Symptoms look more consistent with seropositive rheumatoid arthritis though there is certainly some osteoarthritis particularly in the knee and does not exclude gout although this is probably not causing the current problems with uric acid well-controlled.  Symptoms are doing better on prednisone and methotrexate.  Plan to continue the methotrexate 15 mg weekly recommended trying to start tapering down the prednisone by 5 mg as he is having significant hyperglycemia.  High risk medication use - Plan: CBC with Differential/Platelet, COMPLETE METABOLIC PANEL WITH GFR  New start methotrexate repeating CBC and CMP monitoring for cytopenias or hepatotoxicity.  He denies any symptoms from intolerance or side effect.  Orders: Orders Placed This Encounter  Procedures  . CBC with Differential/Platelet  . COMPLETE METABOLIC PANEL WITH GFR   Meds ordered this encounter  Medications  . predniSONE (DELTASONE) 5 MG tablet    Sig: Take 2 tablets (10 mg total) by mouth daily for 21 days, THEN 1 tablet (5 mg total) daily for 21 days.    Dispense:  63 tablet    Refill:  0  . methotrexate (RHEUMATREX) 2.5 MG tablet    Sig: Take 6 tablets (15 mg total) by mouth once a week. Caution:Chemotherapy. Protect from light.    Dispense:  72 tablet    Refill:  0      Follow-Up Instructions: No follow-ups on file.   Collier Salina, MD  Note - This record has been created using [employer].  Chart creation errors have been sought, but may not always  have been located. Such creation errors do not reflect on  the standard of medical care.

## 2020-11-02 ENCOUNTER — Other Ambulatory Visit: Payer: Self-pay

## 2020-11-02 ENCOUNTER — Ambulatory Visit: Payer: Managed Care, Other (non HMO) | Admitting: Internal Medicine

## 2020-11-02 ENCOUNTER — Encounter: Payer: Self-pay | Admitting: Internal Medicine

## 2020-11-02 VITALS — BP 121/76 | HR 109 | Ht 72.0 in | Wt 302.6 lb

## 2020-11-02 DIAGNOSIS — M0580 Other rheumatoid arthritis with rheumatoid factor of unspecified site: Secondary | ICD-10-CM

## 2020-11-02 DIAGNOSIS — Z79899 Other long term (current) drug therapy: Secondary | ICD-10-CM

## 2020-11-02 MED ORDER — METHOTREXATE 2.5 MG PO TABS: 15.0000 mg | ORAL_TABLET | ORAL | 0 refills | Status: DC

## 2020-11-02 MED ORDER — PREDNISONE 5 MG PO TABS
ORAL_TABLET | ORAL | 0 refills | Status: DC
Start: 2020-11-02 — End: 2020-11-27

## 2020-11-03 LAB — CBC WITH DIFFERENTIAL/PLATELET
Absolute Monocytes: 893 cells/uL (ref 200–950)
Basophils Absolute: 38 cells/uL (ref 0–200)
Basophils Relative: 0.4 %
Eosinophils Absolute: 48 cells/uL (ref 15–500)
Eosinophils Relative: 0.5 %
HCT: 43.2 % (ref 38.5–50.0)
Hemoglobin: 13.3 g/dL (ref 13.2–17.1)
Lymphs Abs: 1131 cells/uL (ref 850–3900)
MCH: 21.8 pg — ABNORMAL LOW (ref 27.0–33.0)
MCHC: 30.8 g/dL — ABNORMAL LOW (ref 32.0–36.0)
MCV: 70.8 fL — ABNORMAL LOW (ref 80.0–100.0)
MPV: 11.1 fL (ref 7.5–12.5)
Monocytes Relative: 9.4 %
Neutro Abs: 7391 cells/uL (ref 1500–7800)
Neutrophils Relative %: 77.8 %
Platelets: 279 10*3/uL (ref 140–400)
RBC: 6.1 10*6/uL — ABNORMAL HIGH (ref 4.20–5.80)
RDW: 18.4 % — ABNORMAL HIGH (ref 11.0–15.0)
Total Lymphocyte: 11.9 %
WBC: 9.5 10*3/uL (ref 3.8–10.8)

## 2020-11-03 LAB — COMPLETE METABOLIC PANEL WITH GFR
AG Ratio: 1.3 (calc) (ref 1.0–2.5)
ALT: 16 U/L (ref 9–46)
AST: 10 U/L (ref 10–35)
Albumin: 4.1 g/dL (ref 3.6–5.1)
Alkaline phosphatase (APISO): 82 U/L (ref 35–144)
BUN: 24 mg/dL (ref 7–25)
CO2: 25 mmol/L (ref 20–32)
Calcium: 9.7 mg/dL (ref 8.6–10.3)
Chloride: 100 mmol/L (ref 98–110)
Creat: 1.25 mg/dL (ref 0.70–1.33)
GFR, Est African American: 74 mL/min/{1.73_m2} (ref >=60)
GFR, Est Non African American: 64 mL/min/{1.73_m2} (ref >=60)
Globulin: 3.1 g/dL (calc) (ref 1.9–3.7)
Glucose, Bld: 279 mg/dL — ABNORMAL HIGH (ref 65–99)
Potassium: 4.1 mmol/L (ref 3.5–5.3)
Sodium: 137 mmol/L (ref 135–146)
Total Bilirubin: 0.3 mg/dL (ref 0.2–1.2)
Total Protein: 7.2 g/dL (ref 6.1–8.1)

## 2020-11-03 NOTE — Progress Notes (Signed)
Lab results look okay to continue methotrexate as discussed. Blood sugar numbers are very high almost certainly related to prednisone use. Can continue plan as discussed in clinic to taper down the prednisone over time while taking the methotrexate.

## 2020-11-09 ENCOUNTER — Other Ambulatory Visit: Payer: Self-pay

## 2020-11-09 ENCOUNTER — Telehealth: Payer: Self-pay

## 2020-11-09 DIAGNOSIS — I10 Essential (primary) hypertension: Secondary | ICD-10-CM

## 2020-11-09 MED ORDER — AMLODIPINE BESYLATE 10 MG PO TABS: 10.0000 mg | ORAL_TABLET | Freq: Every day | ORAL | 3 refills | Status: DC

## 2020-11-09 NOTE — Telephone Encounter (Signed)
[facility] sent to [facility]

## 2020-11-09 NOTE — Telephone Encounter (Signed)
Patient called need med refill but [facility] will not feel because pharmacy says he sees Dr Wende Neighbors, but he has switch his provider to Derek Owen.  amLODipine (NORVASC) 10 MG tablet  Pharmacy: [facility]

## 2020-11-10 ENCOUNTER — Ambulatory Visit (HOSPITAL_COMMUNITY): Payer: Managed Care, Other (non HMO) | Admitting: Physician Assistant

## 2020-11-16 ENCOUNTER — Ambulatory Visit: Payer: Managed Care, Other (non HMO) | Admitting: Nurse Practitioner

## 2020-11-17 ENCOUNTER — Encounter: Payer: Self-pay | Admitting: Nurse Practitioner

## 2020-11-17 ENCOUNTER — Inpatient Hospital Stay (HOSPITAL_COMMUNITY): Payer: Managed Care, Other (non HMO) | Attending: Hematology | Admitting: Physician Assistant

## 2020-11-17 ENCOUNTER — Other Ambulatory Visit: Payer: Self-pay

## 2020-11-17 ENCOUNTER — Ambulatory Visit (INDEPENDENT_AMBULATORY_CARE_PROVIDER_SITE_OTHER): Payer: Managed Care, Other (non HMO) | Admitting: Nurse Practitioner

## 2020-11-17 VITALS — BP 156/80 | HR 103 | Temp 97.0°F | Resp 20 | Wt 304.2 lb

## 2020-11-17 VITALS — BP 144/91 | HR 89 | Temp 99.1°F | Resp 20 | Ht 72.0 in | Wt 303.0 lb

## 2020-11-17 DIAGNOSIS — M0580 Other rheumatoid arthritis with rheumatoid factor of unspecified site: Secondary | ICD-10-CM

## 2020-11-17 DIAGNOSIS — D563 Thalassemia minor: Secondary | ICD-10-CM | POA: Diagnosis not present

## 2020-11-17 DIAGNOSIS — R7989 Other specified abnormal findings of blood chemistry: Secondary | ICD-10-CM | POA: Insufficient documentation

## 2020-11-17 DIAGNOSIS — F17210 Nicotine dependence, cigarettes, uncomplicated: Secondary | ICD-10-CM | POA: Diagnosis not present

## 2020-11-17 DIAGNOSIS — I152 Hypertension secondary to endocrine disorders: Secondary | ICD-10-CM | POA: Insufficient documentation

## 2020-11-17 DIAGNOSIS — E119 Type 2 diabetes mellitus without complications: Secondary | ICD-10-CM | POA: Insufficient documentation

## 2020-11-17 DIAGNOSIS — E1165 Type 2 diabetes mellitus with hyperglycemia: Secondary | ICD-10-CM | POA: Insufficient documentation

## 2020-11-17 DIAGNOSIS — R718 Other abnormality of red blood cells: Secondary | ICD-10-CM | POA: Insufficient documentation

## 2020-11-17 DIAGNOSIS — I10 Essential (primary) hypertension: Secondary | ICD-10-CM | POA: Insufficient documentation

## 2020-11-17 DIAGNOSIS — E1159 Type 2 diabetes mellitus with other circulatory complications: Secondary | ICD-10-CM

## 2020-11-17 MED ORDER — ATORVASTATIN CALCIUM 40 MG PO TABS: 40.0000 mg | ORAL_TABLET | Freq: Every day | ORAL | 1 refills | Status: DC

## 2020-11-17 MED ORDER — AMLODIPINE BESYLATE 10 MG PO TABS: 10.0000 mg | ORAL_TABLET | Freq: Every day | ORAL | 3 refills | Status: DC

## 2020-11-17 MED ORDER — RYBELSUS 14 MG PO TABS: 14.0000 mg | ORAL_TABLET | Freq: Every day | ORAL | 1 refills | Status: DC

## 2020-11-17 MED ORDER — RYBELSUS 3 MG PO TABS
3.0000 mg | ORAL_TABLET | Freq: Every day | ORAL | 0 refills | Status: AC
Start: 2020-11-17 — End: 2020-12-17

## 2020-11-17 MED ORDER — LOSARTAN POTASSIUM-HCTZ 100-25 MG PO TABS: 1.0000 | ORAL_TABLET | Freq: Every day | ORAL | 1 refills | Status: DC

## 2020-11-17 MED ORDER — RYBELSUS 7 MG PO TABS: 7.0000 mg | ORAL_TABLET | Freq: Every day | ORAL | 0 refills | Status: DC

## 2020-11-17 NOTE — Patient Instructions (Signed)
Please have fasting labs drawn 2-3 days prior to your appointment so we can discuss the results during your office visit.  

## 2020-11-17 NOTE — Patient Instructions (Addendum)
[place] at [facility] Discharge Instructions  You were seen today by Tarri Abernethy PA-C for your abnormal labs.  Although your tests came back as normal, we suspect that you have something called "beta thalassemia trait."  This means that your body has a mild defect in hemoglobin (the main molecule of blood cells), which causes your blood cells to be smaller.  Your body compensates by making more blood cells.  This is not causing you any health issues and is not dangerous for you.  You do not require any treatment for this condition.  You do not need to make another appointment with the hematology clinic at this time, but you should continue to follow-up with your primary care provider.  They can monitor your blood counts and send you back to [place] if needed for anemia or other blood conditions.    _____________________________________________________________  Should you have questions after your visit to [facility], please contact our office at [phone] and follow the prompts.  Our office hours are 8:00 a.m. and 4:30 p.m. Monday - Friday.  Please note that voicemails left after 4:00 p.m. may not be returned until the following business day.  We are closed weekends and major holidays.  You do have access to a nurse 24-7, just call the main number to the clinic [phone] and do not press any options, hold on the line and a nurse will answer the phone.    For prescription refill requests, have your pharmacy contact our office and allow 72 hours.    Due to Covid, you will need to wear a mask upon entering the hospital. If you do not have a mask, a mask will be given to you at the Main Entrance upon arrival. For doctor visits, patients may have 1 support person age 22 or older with them. For treatment visits, patients can not have anyone with them due to social distancing guidelines and our immunocompromised population.

## 2020-11-17 NOTE — Assessment & Plan Note (Signed)
-  recently started on prednisone -will check A1c -STOP farxiga; he has urinating frequently -Rx. Rybelsus; did 3 mg sample, 7 mg, and 14 mg; take each for 30 days except 14 mg will be the final dose -taking atorvastatin and losartan

## 2020-11-17 NOTE — Progress Notes (Signed)
Acute Office Visit  Subjective:    Patient ID: Derek Owen, male    DOB: April 18, 1966, 55 y.o.   MRN: [id number]  Chief Complaint  Patient presents with  . Hypertension  . Gout  . Arthritis    HPI Patient is in today for follow-up.  He would like to stop farxiga d/t urinary frequency, an dhe works as a Administrator.  He saw hematology today, and they discharged him from their service d/t satisfactory lab results. Their note included, "1. 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 Elevated ferritin, reactive -Elevated ferritin is reactive secondary to rheumatoid arthritis, no concern for iron overload or hemochromatosis at this time - No further work-up or intervention indicated at this time"  Past Medical History:  Diagnosis Date  . Alcohol use disorder, mild, abuse   . Cigarette smoker   . Diabetes mellitus without complication ([place])   . Hyperlipidemia   . Hypertension   . Polycythemia 04/13/2020  . Stroke [facility])    x3    No past surgical history on file.  Family History  Adopted: Yes    Social History   Socioeconomic History  . Marital status: Single    Spouse name: Not on file  . Number of children: Not on file  . Years of education: Not on file  . Highest education level: Not on file  Occupational History  . Not on file  Tobacco Use  . Smoking status: Current Every Day Smoker    Packs/day: 1.00    Types: Cigarettes  . Smokeless tobacco: Never Used  Vaping Use  . Vaping Use: Never used  Substance and Sexual Activity  . Alcohol use: Not Currently  . Drug use: Not Currently  . Sexual activity: Not Currently    Birth control/protection: Other-see comments  Other Topics Concern  . Not on file  Social History Narrative  . Not on file   Social Determinants of  Health   Financial Resource Strain: Not on file  Food Insecurity: Not on file  Transportation Needs: Not on file  Physical Activity: Not on file  Stress: Not on file  Social Connections: Not on file  Intimate Partner Violence: Not on file    Outpatient Medications Prior to Visit  Medication Sig Dispense Refill  . acetaminophen (TYLENOL) 500 MG tablet Take 1 tablet (500 mg total) by mouth every 6 (six) hours as needed. 30 tablet 0  . aspirin 81 MG chewable tablet Chew by mouth daily.    . colchicine 0.6 MG tablet Take 1 tablet (0.6 mg total) by mouth daily as needed. Take 2 tabs now and 1 tablet in 2 hours if symptoms persist 6 tablet 0  . dapagliflozin propanediol (FARXIGA) 10 MG TABS tablet Take 1 tablet (10 mg total) by mouth daily before breakfast. 90 tablet 1  . diclofenac Sodium (VOLTAREN) 1 % GEL Apply 2 g topically 4 (four) times daily. 761 g 1  . folic acid (FOLVITE) 1 MG tablet Take 1 tablet (1 mg total) by mouth daily. 90 tablet 0  . ibuprofen (ADVIL) 800 MG tablet Take 1 tablet (800 mg total) by mouth every 8 (eight) hours as needed. 30 tablet 0  . methotrexate (RHEUMATREX) 2.5 MG tablet Take 6 tablets (15 mg total) by mouth once a week. Caution:Chemotherapy. Protect from light. 72 tablet 0  .  predniSONE (DELTASONE) 5 MG tablet Take 2 tablets (10 mg total) by mouth daily for 21 days, THEN 1 tablet (5 mg total) daily for 21 days. 63 tablet 0  . amLODIPine (KATERZIA) 1 mg/mL SUSP oral suspension amlodipine    . amLODipine (NORVASC) 10 MG tablet Take 1 tablet (10 mg total) by mouth daily. 90 tablet 3  . atorvastatin (LIPITOR) 40 MG tablet Take 40 mg by mouth daily.    Marland Kitchen glipiZIDE (GLUCOTROL) 10 MG tablet Take 1 tablet (10 mg total) by mouth daily before breakfast. 90 tablet 1  . losartan-hydrochlorothiazide (HYZAAR) 100-25 MG tablet Take 1 tablet by mouth daily.     No facility-administered medications prior to visit.    No Known Allergies  Review of Systems  Constitutional:  Negative.   Respiratory: Negative.   Cardiovascular: Negative.   Musculoskeletal: Positive for arthralgias.       Improved from previous  Psychiatric/Behavioral: Negative.        Objective:    Physical Exam Constitutional:      Appearance: Normal appearance.  Cardiovascular:     Rate and Rhythm: Normal rate and regular rhythm.     Pulses: Normal pulses.     Heart sounds: Normal heart sounds.  Pulmonary:     Effort: Pulmonary effort is normal.     Breath sounds: Normal breath sounds.  Neurological:     Mental Status: He is alert.  Psychiatric:        Mood and Affect: Mood normal.        Behavior: Behavior normal.        Thought Content: Thought content normal.        Judgment: Judgment normal.     BP (!) 144/91   Pulse 89   Temp 99.1 F (37.3 C)   Resp 20   Ht 6' (1.829 m)   Wt (!) 303 lb (137.4 kg)   SpO2 96%   BMI 41.09 kg/m  Wt Readings from Last 3 Encounters:  11/17/20 (!) 303 lb (137.4 kg)  11/17/20 (!) 304 lb 3.8 oz (138 kg)  11/02/20 (!) 302 lb 9.6 oz (137.3 kg)    Health Maintenance Due  Topic Date Due  . PNEUMOCOCCAL POLYSACCHARIDE VACCINE AGE 11-64 HIGH RISK  Never done  . COVID-19 Vaccine (1) Never done  . FOOT EXAM  Never done  . OPHTHALMOLOGY EXAM  Never done    There are no preventive care reminders to display for this patient.   No results found for: TSH Lab Results  Component Value Date   WBC 9.5 11/02/2020   HGB 13.3 11/02/2020   HCT 43.2 11/02/2020   MCV 70.8 (L) 11/02/2020   PLT 279 11/02/2020   Lab Results  Component Value Date   NA 137 11/02/2020   K 4.1 11/02/2020   CO2 25 11/02/2020   GLUCOSE 279 (H) 11/02/2020   BUN 24 11/02/2020   CREATININE 1.25 11/02/2020   BILITOT 0.3 11/02/2020   ALKPHOS 108 09/21/2020   AST 10 11/02/2020   ALT 16 11/02/2020   PROT 7.2 11/02/2020   ALBUMIN 4.2 09/21/2020   CALCIUM 9.7 11/02/2020   EGFR 84 10/08/2020   Lab Results  Component Value Date   CHOL 142 06/01/2020   Lab Results   Component Value Date   HDL 49 06/01/2020   Lab Results  Component Value Date   LDLCALC 77 06/01/2020   Lab Results  Component Value Date   TRIG 83 06/01/2020   No results found for:  CHOLHDL Lab Results  Component Value Date   HGBA1C 6.9 (H) 06/01/2020       Assessment & Plan:   Problem List Items Addressed This Visit      Cardiovascular and Mediastinum   Hypertension associated with diabetes ([place])    -refilled amlodipine and losartan-HCTZ today -BP slightly elevated today      Relevant Medications   amLODipine (NORVASC) 10 MG tablet   atorvastatin (LIPITOR) 40 MG tablet   losartan-hydrochlorothiazide (HYZAAR) 100-25 MG tablet   Semaglutide (RYBELSUS) 3 MG TABS   Semaglutide (RYBELSUS) 7 MG TABS (Start on 12/17/2020)   Semaglutide (RYBELSUS) 14 MG TABS (Start on 01/17/2021)     Endocrine   Type II diabetes mellitus, uncontrolled ([place]) - Primary    -recently started on prednisone -will check A1c -STOP farxiga; he has urinating frequently -Rx. Rybelsus; did 3 mg sample, 7 mg, and 14 mg; take each for 30 days except 14 mg will be the final dose -taking atorvastatin and losartan       Relevant Medications   atorvastatin (LIPITOR) 40 MG tablet   losartan-hydrochlorothiazide (HYZAAR) 100-25 MG tablet   Semaglutide (RYBELSUS) 3 MG TABS   Semaglutide (RYBELSUS) 7 MG TABS (Start on 12/17/2020)   Semaglutide (RYBELSUS) 14 MG TABS (Start on 01/17/2021)   Other Relevant Orders   Lipid Panel With LDL/HDL Ratio   Hemoglobin A1c   Microalbumin / creatinine urine ratio     Musculoskeletal and Integument   Polyarthritis with positive rheumatoid factor ([place])    -followed by rheumatology -has been taking prednisone and methotrexate -still has some arthralgias, but much improved from previous visits        Other   RBC microcytosis    -was released by hematology today       Other Visit Diagnoses    Hypertension, essential       Relevant Medications   amLODipine  (NORVASC) 10 MG tablet   atorvastatin (LIPITOR) 40 MG tablet   losartan-hydrochlorothiazide (HYZAAR) 100-25 MG tablet   Other Relevant Orders   CBC with Differential/Platelet   CMP14+EGFR   Lipid Panel With LDL/HDL Ratio       Meds ordered this encounter  Medications  . amLODipine (NORVASC) 10 MG tablet    Sig: Take 1 tablet (10 mg total) by mouth daily.    Dispense:  90 tablet    Refill:  3  . atorvastatin (LIPITOR) 40 MG tablet    Sig: Take 1 tablet (40 mg total) by mouth daily.    Dispense:  90 tablet    Refill:  1  . losartan-hydrochlorothiazide (HYZAAR) 100-25 MG tablet    Sig: Take 1 tablet by mouth daily.    Dispense:  90 tablet    Refill:  1  . Semaglutide (RYBELSUS) 3 MG TABS    Sig: Take 3 mg by mouth daily.    Dispense:  30 tablet    Refill:  0    <SAMPLE>  . Semaglutide (RYBELSUS) 7 MG TABS    Sig: Take 7 mg by mouth daily.    Dispense:  30 tablet    Refill:  0  . Semaglutide (RYBELSUS) 14 MG TABS    Sig: Take 14 mg by mouth daily.    Dispense:  90 tablet    Refill:  [place], NP

## 2020-11-17 NOTE — Assessment & Plan Note (Signed)
-  was released by hematology today

## 2020-11-17 NOTE — Assessment & Plan Note (Signed)
-  refilled amlodipine and losartan-HCTZ today -BP slightly elevated today

## 2020-11-17 NOTE — Assessment & Plan Note (Signed)
-  followed by rheumatology -has been taking prednisone and methotrexate -still has some arthralgias, but much improved from previous visits

## 2020-11-17 NOTE — Progress Notes (Signed)
[place], [place] [address]   CLINIC:  Medical Oncology/Hematology  PCP:  Noreene Larsson, NP [address]  [place] [address] [place] [address] [phone]   REASON FOR VISIT:  Follow-up for microcytosis  CURRENT THERAPY: Under work-up  INTERVAL HISTORY:  [name] 55 y.o. male returns for routine follow-up of microcytosis with elevated RBC count as well as elevated ferritin.  He was initially referred by his primary care NP due to abnormal labs.  He was seen for initial consultation by Tarri Abernethy PA-C and Dr. Delton Coombes on 10/22/2020.  He returns today to discuss results of the work-up initiated at that time.  Since his last visit, the patient has been doing well.  He is continuing to see rheumatology for his seropositive rheumatoid arthritis, and has been started on methotrexate.  He is continuing to have joint pain, but this is improved since starting RA treatment.    He has no other complaints.  No fatigue, fever, chills, shortness of breath, cough, chest pain, nausea, vomiting, abdominal pain.  Denies any signs or symptoms of blood loss.  No current signs or symptoms of blood clots.  He has good energy and appetite. He endorses that he is maintaining a stable weight.    REVIEW OF SYSTEMS:  Review of Systems  Constitutional: Negative for appetite change, chills, diaphoresis, fatigue, fever and unexpected weight change.  HENT:   Negative for lump/mass and nosebleeds.   Eyes: Negative for eye problems.  Respiratory: Negative for cough, hemoptysis and shortness of breath.   Cardiovascular: Negative for chest pain, leg swelling and palpitations.  Gastrointestinal: Negative for abdominal pain, blood in stool, constipation, diarrhea, nausea and vomiting.  Genitourinary: Negative for hematuria.   Musculoskeletal: Positive for arthralgias.  Skin: Negative.   Neurological: Negative for dizziness, headaches and light-headedness.   Hematological: Does not bruise/bleed easily.      PAST MEDICAL/SURGICAL HISTORY:  Past Medical History:  Diagnosis Date  . Alcohol use disorder, mild, abuse   . Cigarette smoker   . Diabetes mellitus without complication ([place])   . Hyperlipidemia   . Hypertension   . Polycythemia 04/13/2020  . Stroke [facility])    x3   No past surgical history on file.   SOCIAL HISTORY:  Social History   Socioeconomic History  . Marital status: Single    Spouse name: Not on file  . Number of children: Not on file  . Years of education: Not on file  . Highest education level: Not on file  Occupational History  . Not on file  Tobacco Use  . Smoking status: Current Every Day Smoker    Packs/day: 1.00    Types: Cigarettes  . Smokeless tobacco: Never Used  Vaping Use  . Vaping Use: Never used  Substance and Sexual Activity  . Alcohol use: Not Currently  . Drug use: Not Currently  . Sexual activity: Not Currently    Birth control/protection: Other-see comments  Other Topics Concern  . Not on file  Social History Narrative  . Not on file   Social Determinants of Health   Financial Resource Strain: Not on file  Food Insecurity: Not on file  Transportation Needs: Not on file  Physical Activity: Not on file  Stress: Not on file  Social Connections: Not on file  Intimate Partner Violence: Not on file    FAMILY HISTORY:  Family History  Adopted: Yes    CURRENT MEDICATIONS:  Outpatient Encounter Medications as  of 11/17/2020  Medication Sig  . acetaminophen (TYLENOL) 500 MG tablet Take 1 tablet (500 mg total) by mouth every 6 (six) hours as needed.  Marland Kitchen allopurinol (ZYLOPRIM) 300 MG tablet Take 0.5 tablets (150 mg total) by mouth daily for 14 days, THEN 1 tablet (300 mg total) daily. Start 1 week after gout flare resolves.. (Patient not taking: Reported on 11/02/2020)  . amLODipine (NORVASC) 10 MG tablet Take 1 tablet (10 mg total) by mouth daily.  Marland Kitchen aspirin 81 MG chewable tablet Chew  by mouth daily.  Marland Kitchen atorvastatin (LIPITOR) 40 MG tablet Take 40 mg by mouth daily.  . colchicine 0.6 MG tablet Take 1 tablet (0.6 mg total) by mouth daily as needed. Take 2 tabs now and 1 tablet in 2 hours if symptoms persist  . dapagliflozin propanediol (FARXIGA) 10 MG TABS tablet Take 1 tablet (10 mg total) by mouth daily before breakfast.  . diclofenac Sodium (VOLTAREN) 1 % GEL Apply 2 g topically 4 (four) times daily.  . folic acid (FOLVITE) 1 MG tablet Take 1 tablet (1 mg total) by mouth daily.  Marland Kitchen glipiZIDE (GLUCOTROL) 10 MG tablet Take 1 tablet (10 mg total) by mouth daily before breakfast.  . ibuprofen (ADVIL) 800 MG tablet Take 1 tablet (800 mg total) by mouth every 8 (eight) hours as needed.  Marland Kitchen losartan-hydrochlorothiazide (HYZAAR) 100-25 MG tablet Take 1 tablet by mouth daily.  . methotrexate (RHEUMATREX) 2.5 MG tablet Take 6 tablets (15 mg total) by mouth once a week. Caution:Chemotherapy. Protect from light.  . predniSONE (DELTASONE) 5 MG tablet Take 2 tablets (10 mg total) by mouth daily for 21 days, THEN 1 tablet (5 mg total) daily for 21 days.  . sitaGLIPtin (JANUVIA) 100 MG tablet Take 100 mg by mouth daily.   No facility-administered encounter medications on file as of 11/17/2020.    ALLERGIES:  No Known Allergies   PHYSICAL EXAM:  ECOG PERFORMANCE STATUS: 0 - Asymptomatic  There were no vitals filed for this visit. There were no vitals filed for this visit. Physical Exam Constitutional:      Appearance: Normal appearance. He is obese.  HENT:     Head: Normocephalic and atraumatic.     Mouth/Throat:     Mouth: Mucous membranes are moist.  Eyes:     Extraocular Movements: Extraocular movements intact.     Pupils: Pupils are equal, round, and reactive to light.  Cardiovascular:     Rate and Rhythm: Normal rate and regular rhythm.     Pulses: Normal pulses.     Heart sounds: Normal heart sounds.  Pulmonary:     Effort: Pulmonary effort is normal.     Breath  sounds: Normal breath sounds.  Abdominal:     General: Bowel sounds are normal.     Palpations: Abdomen is soft.     Tenderness: There is no abdominal tenderness.  Musculoskeletal:        General: No swelling.     Right lower leg: No edema.     Left lower leg: No edema.  Lymphadenopathy:     Cervical: No cervical adenopathy.  Skin:    General: Skin is warm and dry.  Neurological:     General: No focal deficit present.     Mental Status: He is alert and oriented to person, place, and time.  Psychiatric:        Mood and Affect: Mood normal.        Behavior: Behavior normal.  LABORATORY DATA:  I have reviewed the labs as listed.  CBC    Component Value Date/Time   WBC 9.5 11/02/2020 1422   RBC 6.10 (H) 11/02/2020 1422   HGB 13.3 11/02/2020 1422   HGB 14.0 10/08/2020 0923   HCT 43.2 11/02/2020 1422   HCT 45.2 10/08/2020 0923   PLT 279 11/02/2020 1422   PLT 267 10/08/2020 0923   MCV 70.8 (L) 11/02/2020 1422   MCV 69 (L) 10/08/2020 0923   MCH 21.8 (L) 11/02/2020 1422   MCHC 30.8 (L) 11/02/2020 1422   RDW 18.4 (H) 11/02/2020 1422   RDW 17.9 (H) 10/08/2020 0923   LYMPHSABS 1,131 11/02/2020 1422   LYMPHSABS 1.3 10/08/2020 0923   EOSABS 48 11/02/2020 1422   EOSABS 0.1 10/08/2020 0923   BASOSABS 38 11/02/2020 1422   BASOSABS 0.1 10/08/2020 0923   CMP Latest Ref Rng & Units 11/02/2020 10/08/2020 09/21/2020  Glucose 65 - 99 mg/dL 279(H) 147(H) 246(H)  BUN 7 - 25 mg/dL 24 23 18   Creatinine 0.70 - 1.33 mg/dL 1.25 1.04 1.01  Sodium 135 - 146 mmol/L 137 135 139  Potassium 3.5 - 5.3 mmol/L 4.1 4.4 4.0  Chloride 98 - 110 mmol/L 100 96 99  CO2 20 - 32 mmol/L 25 20 24   Calcium 8.6 - 10.3 mg/dL 9.7 9.8 9.4  Total Protein 6.1 - 8.1 g/dL 7.2 - 7.3  Total Bilirubin 0.2 - 1.2 mg/dL 0.3 - 0.5  Alkaline Phos 44 - 121 IU/L - - 108  AST 10 - 35 U/L 10 - 7  ALT 9 - 46 U/L 16 - 13    DIAGNOSTIC IMAGING:  I have independently reviewed the relevant imaging and discussed with the  patient.  ASSESSMENT: 1. 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 Elevated ferritin -Patient's ferritin was 513 (09/21/2020), but with low serum iron saturation at 13% -Suspect inflammatory sequestration of ferritin as an acute phase reactant, due to rheumatoid arthritis  -ESR checked at about the same time as the ferritin (10/06/2020) was elevated at 20, CRP elevated at 43; patient was positive for ANA and rheumatoid factor - Hemochromatosis DNA analysis was negative for any hereditary hemochromatosis mutations  3.  Other history -PMH: Diabetes mellitus, hypertension, suspected rheumatoid arthritis -Social:  Employed as a Administrator. He smokes 1 pack/day of cigarettes. He reports a history of excessive drinking, reports that he stopped cold [place] 2 months ago. He denies illicit drug use. -The patient's family history is unknown, as he was adopted.   PLAN:  1. 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 Elevated ferritin, reactive -Elevated ferritin is reactive secondary to rheumatoid arthritis, no concern for iron overload or hemochromatosis at this time - No further work-up or intervention indicated at this time   PLAN SUMMARY & DISPOSITION: -We will discharge patient from hematology clinic to follow-up with PCP - Patient can return to our clinic as needed at the request of PCP or other specialist  All questions were answered. The  patient knows  to call the clinic with any problems, questions or concerns.  Medical decision making: Low  Time spent on visit: I spent 15 minutes counseling the patient face to face. The total time spent in the appointment was 20 minutes and more than 50% was on counseling.   Harriett Rush, PA-C  11/17/20 8:41 AM

## 2020-11-22 LAB — LIPID PANEL WITH LDL/HDL RATIO
Cholesterol, Total: 202 mg/dL — ABNORMAL HIGH (ref 100–199)
HDL: 51 mg/dL (ref >39)
LDL Chol Calc (NIH): 128 mg/dL — ABNORMAL HIGH (ref 0–99)
LDL/HDL Ratio: 2.5 ratio (ref 0.0–3.6)
Triglycerides: 127 mg/dL (ref 0–149)
VLDL Cholesterol Cal: 23 mg/dL (ref 5–40)

## 2020-11-22 LAB — CMP14+EGFR
ALT: 20 IU/L (ref 0–44)
AST: 11 IU/L (ref 0–40)
Albumin/Globulin Ratio: 1.3 (ref 1.2–2.2)
Albumin: 4.3 g/dL (ref 3.8–4.9)
Alkaline Phosphatase: 94 IU/L (ref 44–121)
BUN/Creatinine Ratio: 17 (ref 9–20)
BUN: 17 mg/dL (ref 6–24)
Bilirubin Total: 0.4 mg/dL (ref 0.0–1.2)
CO2: 24 mmol/L (ref 20–29)
Calcium: 9.9 mg/dL (ref 8.7–10.2)
Chloride: 97 mmol/L (ref 96–106)
Creatinine, Ser: 1 mg/dL (ref 0.76–1.27)
Globulin, Total: 3.2 g/dL (ref 1.5–4.5)
Glucose: 257 mg/dL — ABNORMAL HIGH (ref 65–99)
Potassium: 4.7 mmol/L (ref 3.5–5.2)
Sodium: 136 mmol/L (ref 134–144)
Total Protein: 7.5 g/dL (ref 6.0–8.5)
eGFR: 88 mL/min/{1.73_m2} (ref >59)

## 2020-11-22 LAB — HEMOGLOBIN A1C
Est. average glucose Bld gHb Est-mCnc: 278 mg/dL
Hgb A1c MFr Bld: 11.3 % — ABNORMAL HIGH (ref 4.8–5.6)

## 2020-11-22 LAB — CBC WITH DIFFERENTIAL/PLATELET
Basophils Absolute: 0.1 10*3/uL (ref 0.0–0.2)
Basos: 1 %
EOS (ABSOLUTE): 0.1 10*3/uL (ref 0.0–0.4)
Eos: 1 %
Hematocrit: 40.8 % (ref 37.5–51.0)
Hemoglobin: 13.2 g/dL (ref 13.0–17.7)
Immature Grans (Abs): 0.1 10*3/uL (ref 0.0–0.1)
Immature Granulocytes: 1 %
Lymphocytes Absolute: 1.5 10*3/uL (ref 0.7–3.1)
Lymphs: 17 %
MCH: 22.3 pg — ABNORMAL LOW (ref 26.6–33.0)
MCHC: 32.4 g/dL (ref 31.5–35.7)
MCV: 69 fL — ABNORMAL LOW (ref 79–97)
Monocytes Absolute: 0.9 10*3/uL (ref 0.1–0.9)
Monocytes: 9 %
Neutrophils Absolute: 6.5 10*3/uL (ref 1.4–7.0)
Neutrophils: 71 %
Platelets: 269 10*3/uL (ref 150–450)
RBC: 5.92 x10E6/uL — ABNORMAL HIGH (ref 4.14–5.80)
RDW: 18.5 % — ABNORMAL HIGH (ref 11.6–15.4)
WBC: 9.1 10*3/uL (ref 3.4–10.8)

## 2020-11-22 LAB — MICROALBUMIN / CREATININE URINE RATIO
Creatinine, Urine: 79.8 mg/dL
Microalb/Creat Ratio: 56 mg/g creat — ABNORMAL HIGH (ref 0–29)
Microalbumin, Urine: 44.7 ug/mL

## 2020-11-25 ENCOUNTER — Other Ambulatory Visit: Payer: Self-pay | Admitting: Internal Medicine

## 2020-11-25 DIAGNOSIS — M0580 Other rheumatoid arthritis with rheumatoid factor of unspecified site: Secondary | ICD-10-CM

## 2020-11-25 NOTE — Telephone Encounter (Signed)
Next Visit: 01/12/2021  Last Visit: 11/02/2020  Last Fill: 11/02/2020  DX: Polyarthritis with positive rheumatoid factor  Current Dose per office note 11/02/2020: Take 2 tablets (10 mg total) by mouth daily for 21 days, THEN 1 tablet (5 mg total) daily for 21 days.  Okay to refill Prednisone? If so, please update sig accordingly.

## 2020-11-30 ENCOUNTER — Other Ambulatory Visit: Payer: Self-pay | Admitting: Nurse Practitioner

## 2020-11-30 DIAGNOSIS — E1165 Type 2 diabetes mellitus with hyperglycemia: Secondary | ICD-10-CM

## 2020-11-30 NOTE — Progress Notes (Signed)
His A1c is elevated quite a bit, so I got him in with Dr. Dorris Fetch with endocrinology.

## 2020-12-01 ENCOUNTER — Telehealth: Payer: Self-pay

## 2020-12-01 NOTE — Telephone Encounter (Signed)
Pt informed, states he will go.

## 2020-12-01 NOTE — Telephone Encounter (Signed)
I know prednisone will elevate his BS some, but 21 days is a fraction of the 90 days that A1c monitors. If we recheck A1c in 3 months and he is >8; he needs to go.

## 2020-12-01 NOTE — Telephone Encounter (Signed)
Patient returning call needs to speak to a nurse about the referral to [place] office that he's being referred out to. Call back # [phone].

## 2020-12-01 NOTE — Telephone Encounter (Signed)
Pt states that he is taking prednisone for 21 days by Vernelle Emerald, and he feels like this is why his glucose is elevated so he would like to hold off on referral to Dr. Dorris Fetch.

## 2020-12-06 ENCOUNTER — Other Ambulatory Visit: Payer: Self-pay | Admitting: Nurse Practitioner

## 2020-12-15 ENCOUNTER — Encounter: Payer: Self-pay | Admitting: Nurse Practitioner

## 2020-12-15 ENCOUNTER — Other Ambulatory Visit: Payer: Self-pay

## 2020-12-15 ENCOUNTER — Ambulatory Visit: Payer: Managed Care, Other (non HMO) | Admitting: Nurse Practitioner

## 2020-12-15 VITALS — BP 117/79 | HR 101 | Ht 72.0 in | Wt 299.0 lb

## 2020-12-15 DIAGNOSIS — E1165 Type 2 diabetes mellitus with hyperglycemia: Secondary | ICD-10-CM

## 2020-12-15 DIAGNOSIS — E782 Mixed hyperlipidemia: Secondary | ICD-10-CM | POA: Diagnosis not present

## 2020-12-15 DIAGNOSIS — I10 Essential (primary) hypertension: Secondary | ICD-10-CM

## 2020-12-15 NOTE — Progress Notes (Signed)
Endocrinology Consult Note       12/15/2020, 1:04 PM   Subjective:    Patient ID: Derek Owen, male    DOB: May 18, 1966.  Derek Owen is being seen in consultation for management of currently uncontrolled symptomatic diabetes requested by  Noreene Larsson, NP.   Past Medical History:  Diagnosis Date  . Alcohol use disorder, mild, abuse   . Cigarette smoker   . Diabetes mellitus without complication ([place])   . Hyperlipidemia   . Hypertension   . Polycythemia 04/13/2020  . Stroke [facility])    x3    History reviewed. No pertinent surgical history.  Social History   Socioeconomic History  . Marital status: Single    Spouse name: Not on file  . Number of children: Not on file  . Years of education: Not on file  . Highest education level: Not on file  Occupational History  . Not on file  Tobacco Use  . Smoking status: Current Every Day Smoker    Packs/day: 1.00    Types: Cigarettes  . Smokeless tobacco: Never Used  Vaping Use  . Vaping Use: Never used  Substance and Sexual Activity  . Alcohol use: Not Currently  . Drug use: Not Currently  . Sexual activity: Not Currently    Birth control/protection: Other-see comments  Other Topics Concern  . Not on file  Social History Narrative  . Not on file   Social Determinants of Health   Financial Resource Strain: Not on file  Food Insecurity: Not on file  Transportation Needs: Not on file  Physical Activity: Not on file  Stress: Not on file  Social Connections: Not on file    Family History  Adopted: Yes    Outpatient Encounter Medications as of 12/15/2020  Medication Sig  . amLODipine (NORVASC) 10 MG tablet Take 1 tablet (10 mg total) by mouth daily.  Marland Kitchen aspirin 81 MG chewable tablet Chew by mouth daily.  Marland Kitchen atorvastatin (LIPITOR) 40 MG tablet Take 1 tablet (40 mg total) by mouth daily.  . colchicine 0.6 MG tablet Take 1 tablet (0.6 mg total)  by mouth daily as needed. Take 2 tabs now and 1 tablet in 2 hours if symptoms persist  . folic acid (FOLVITE) 1 MG tablet Take 1 tablet (1 mg total) by mouth daily.  Marland Kitchen glipiZIDE (GLUCOTROL) 10 MG tablet Take 10 mg by mouth daily before breakfast.  . losartan-hydrochlorothiazide (HYZAAR) 100-25 MG tablet Take 1 tablet by mouth daily.  . methotrexate (RHEUMATREX) 2.5 MG tablet Take 6 tablets (15 mg total) by mouth once a week. Caution:Chemotherapy. Protect from light.  . predniSONE (DELTASONE) 5 MG tablet Take 1 tablet (5 mg total) by mouth daily.  . [DISCONTINUED] dapagliflozin propanediol (FARXIGA) 10 MG TABS tablet Take 1 tablet (10 mg total) by mouth daily before breakfast.  . acetaminophen (TYLENOL) 500 MG tablet Take 1 tablet (500 mg total) by mouth every 6 (six) hours as needed.  . diclofenac Sodium (VOLTAREN) 1 % GEL Apply 2 g topically 4 (four) times daily.  Marland Kitchen ibuprofen (ADVIL) 800 MG tablet Take 1 tablet (800 mg total) by mouth every 8 (eight) hours as needed.  . [  START ON 01/17/2021] Semaglutide (RYBELSUS) 14 MG TABS Take 14 mg by mouth daily. (Patient not taking: Reported on 12/15/2020)  . Semaglutide (RYBELSUS) 3 MG TABS Take 3 mg by mouth daily. (Patient not taking: Reported on 12/15/2020)  . [START ON 12/17/2020] Semaglutide (RYBELSUS) 7 MG TABS Take 7 mg by mouth daily. (Patient not taking: Reported on 12/15/2020)   No facility-administered encounter medications on file as of 12/15/2020.    ALLERGIES: No Known Allergies  VACCINATION STATUS: Immunization History  Administered Date(s) Administered  . Tdap 10/08/2020    Diabetes He presents for his initial diabetic visit. He has type 2 diabetes mellitus. Onset time: Diagnosed at approx age of 25. His disease course has been worsening. There are no hypoglycemic associated symptoms. Associated symptoms include blurred vision, fatigue, polydipsia and polyuria. There are no hypoglycemic complications. Symptoms are stable. Diabetic  complications include a CVA, impotence, nephropathy and peripheral neuropathy. Risk factors for coronary artery disease include diabetes mellitus, dyslipidemia, family history, obesity, male sex, hypertension, sedentary lifestyle and tobacco exposure. Current diabetic treatment includes oral agent (dual therapy) (Currently on Farxiga (finishing Rx) then starting on Rybelsus in addition to Glipizide). He is compliant with treatment most of the time. His weight is fluctuating minimally. He is following a generally unhealthy diet. When asked about meal planning, he reported none. He has not had a previous visit with a dietitian. He never participates in exercise. (He presents today for his consultation with his meter, no logs showing inconsistent glucose monitoring pattern (last time checked was in February).  His most recent A1c was 11.3% on 5/13.  He says he was started on prednisone for arthralgias and that is when his diabetes got out of control.  He is still on prednisone taper at this time.  He does not monitor glucose routinely, nor does he have a routine meal pattern.  He does admits to drinking water, flavored water, and an occasional beer or soda.  He does not engage in routine physical activity due to bilateral knee pain.  He denies any s/s of hypoglycemia.) An ACE inhibitor/angiotensin II receptor blocker is being taken. He does not see a podiatrist.Eye exam is current.  Hypertension This is a chronic problem. The current episode started more than 1 year ago. The problem has been resolved since onset. The problem is controlled. Associated symptoms include blurred vision. There are no associated agents to hypertension. Risk factors for coronary artery disease include diabetes mellitus, dyslipidemia, family history, obesity, male gender, sedentary lifestyle and smoking/tobacco exposure. Past treatments include angiotensin blockers, diuretics and calcium channel blockers. Compliance problems include exercise  and diet.  Hypertensive end-organ damage includes kidney disease and CVA. Identifiable causes of hypertension include chronic renal disease.  Hyperlipidemia This is a chronic problem. The current episode started more than 1 year ago. The problem is uncontrolled. Recent lipid tests were reviewed and are high. Exacerbating diseases include chronic renal disease, diabetes and obesity. Factors aggravating his hyperlipidemia include fatty foods, thiazides and smoking. Current antihyperlipidemic treatment includes statins. The current treatment provides moderate improvement of lipids. Compliance problems include adherence to diet and adherence to exercise.  Risk factors for coronary artery disease include diabetes mellitus, dyslipidemia, family history, hypertension, male sex, obesity and a sedentary lifestyle.     Review of systems  Constitutional: + Minimally fluctuating body weight, current Body mass index is 40.55 kg/m., + fatigue, no subjective hyperthermia, no subjective hypothermia Eyes: + blurry vision-due for eye exam soon, no xerophthalmia ENT: no sore throat,  no nodules palpated in throat, no dysphagia/odynophagia, no hoarseness Cardiovascular: no chest pain, no shortness of breath, no palpitations, no leg swelling Respiratory: no cough, no shortness of breath Gastrointestinal: no nausea/vomiting/diarrhea Genitourinary: + polyuria, difficulty achieving an erection Musculoskeletal: no muscle/joint aches Skin: no rashes, no hyperemia Neurological: no tremors, no numbness, no tingling, no dizziness Psychiatric: no depression, no anxiety  Objective:     BP 117/79   Pulse (!) 101   Ht 6' (1.829 m)   Wt 299 lb (135.6 kg)   BMI 40.55 kg/m   Wt Readings from Last 3 Encounters:  12/15/20 299 lb (135.6 kg)  11/17/20 (!) 303 lb (137.4 kg)  11/17/20 (!) 304 lb 3.8 oz (138 kg)     BP Readings from Last 3 Encounters:  12/15/20 117/79  11/17/20 (!) 144/91  11/17/20 (!) 156/80      Physical Exam- Limited  Constitutional:  Body mass index is 40.55 kg/m. , not in acute distress, normal state of mind Eyes:  EOMI, no exophthalmos Neck: Supple Cardiovascular: RRR, no murmurs, rubs, or gallops, no edema Respiratory: Adequate breathing efforts, no crackles, rales, rhonchi, or wheezing Musculoskeletal: no gross deformities, strength intact in all four extremities, no gross restriction of joint movements Skin:  no rashes, no hyperemia Neurological: no tremor with outstretched hands    CMP ( most recent) CMP     Component Value Date/Time   NA 136 11/20/2020 0847   K 4.7 11/20/2020 0847   CL 97 11/20/2020 0847   CO2 24 11/20/2020 0847   GLUCOSE 257 (H) 11/20/2020 0847   GLUCOSE 279 (H) 11/02/2020 1422   BUN 17 11/20/2020 0847   CREATININE 1.00 11/20/2020 0847   CREATININE 1.25 11/02/2020 1422   CALCIUM 9.9 11/20/2020 0847   PROT 7.5 11/20/2020 0847   ALBUMIN 4.3 11/20/2020 0847   AST 11 11/20/2020 0847   ALT 20 11/20/2020 0847   ALKPHOS 94 11/20/2020 0847   BILITOT 0.4 11/20/2020 0847   GFRNONAA 64 11/02/2020 1422   GFRAA 74 11/02/2020 1422     Diabetic Labs (most recent): Lab Results  Component Value Date   HGBA1C 11.3 (H) 11/20/2020   HGBA1C 6.9 (H) 06/01/2020     Lipid Panel ( most recent) Lipid Panel     Component Value Date/Time   CHOL 202 (H) 11/20/2020 0847   TRIG 127 11/20/2020 0847   HDL 51 11/20/2020 0847   LDLCALC 128 (H) 11/20/2020 0847   LABVLDL 23 11/20/2020 0847      No results found for: TSH, FREET4         Assessment & Plan:   1) Type 2 diabetes mellitus with hyperglycemia, without long-term current use of insulin ([place])  He presents today for his consultation with his meter, no logs showing inconsistent glucose monitoring pattern (last time checked was in February).  His most recent A1c was 11.3% on 5/13.  He says he was started on prednisone for arthralgias and that is when his diabetes got out of control.  He is  still on prednisone taper at this time.  He does not monitor glucose routinely, nor does he have a routine meal pattern.  He does admits to drinking water, flavored water, and an occasional beer or soda.  He does not engage in routine physical activity due to bilateral knee pain.  He denies any s/s of hypoglycemia.  - Derek Owen has currently uncontrolled symptomatic type 2 DM since 55 years of age, with most recent A1c of 11.3 %.   -  Recent labs reviewed.  - I had a long discussion with him about the progressive nature of diabetes and the pathology behind its complications. -his diabetes is complicated by CKD, CVA and he remains at a high risk for more acute and chronic complications which include CAD, retinopathy, and neuropathy. These are all discussed in detail with him.  - I have counseled him on diet and weight management by adopting a carbohydrate restricted/protein rich diet. Patient is encouraged to switch to unprocessed or minimally processed complex starch and increased protein intake (animal or plant source), fruits, and vegetables. -  he is advised to stick to a routine mealtimes to eat 3 meals a day and avoid unnecessary snacks (to snack only to correct hypoglycemia).   - he acknowledges that there is a room for improvement in his food and drink choices. - Suggestion is made for him to avoid simple carbohydrates from his diet including Cakes, Sweet Desserts, Ice Cream, Soda (diet and regular), Sweet Tea, Candies, Chips, Cookies, Store Bought Juices, Alcohol in Excess of 1-2 drinks a day, Artificial Sweeteners, Coffee Creamer, and "Sugar-free" Products. This will help patient to have more stable blood glucose profile and potentially avoid unintended weight gain.  - he will be scheduled with Jearld Fenton, RDN, CDE for diabetes education.  - I have approached him with the following individualized plan to manage his diabetes and patient agrees:   -He is advised to start the  Rybelsus as directed by his PCP, titrating up to therapeutic dose and continue his Glipizide 10 mg po daily with breakfast.  -he is encouraged to start monitoring glucose 4 times daily, before meals and before bed, to log their readings on the clinic sheets provided, and bring them to review at follow up appointment in 2 weeks.  - Adjustment parameters are given to him for hypo and hyperglycemia in writing. - he is encouraged to call clinic for blood glucose levels less than 70 or above 300 mg /dl.  - his Wilder Glade will be discontinued, risk outweighs benefit for this patient.  - he is not a candidate for Metformin as he did not tolerate it in the past due to GI side effects.  - Specific targets for  A1c; LDL, HDL, and Triglycerides were discussed with the patient.  2) Blood Pressure /Hypertension:  his blood pressure is controlled to target.   he is advised to continue his current medications including Norvasc 10 mg po daily, and Losartan-HCT 100-25 mg po daily.  3) Lipids/Hyperlipidemia:    Review of his recent lipid panel from 11/20/20 showed uncontrolled LDL at 128 .  he is advised to continue Lipitor 40 mg p.o. daily at bedtime.  Side effects and precautions discussed with him.  4)  Weight/Diet:  his Body mass index is 40.55 kg/m.  -  clearly complicating his diabetes care.   he is a candidate for weight loss. I discussed with him the fact that loss of 5 - 10% of his  current body weight will have the most impact on his diabetes management.  Exercise, and detailed carbohydrates information provided  -  detailed on discharge instructions.  5) Chronic Care/Health Maintenance: -he is on ACEI/ARB and Statin medications and is encouraged to initiate and continue to follow up with Ophthalmology, Dentist, Podiatrist at least yearly or according to recommendations, and advised to [place]. I have recommended yearly flu vaccine and pneumonia vaccine at least every 5 years; moderate intensity  exercise for up to 150 minutes weekly; and  sleep for at least 7 hours a day.  - he is advised to maintain close follow up with Noreene Larsson, NP for primary care needs, as well as his other providers for optimal and coordinated care.   - Time spent in this patient care: 60 min, of which > 50% was spent in counseling him about his diabetes and the rest reviewing his blood glucose logs, discussing his hypoglycemia and hyperglycemia episodes, reviewing his current and previous labs/studies (including abstraction from other facilities) and medications doses and developing a long term treatment plan based on the latest standards of care/guidelines; and documenting his care.    Please refer to Patient Instructions for Blood Glucose Monitoring and Insulin/Medications Dosing Guide" in media tab for additional information. Please also refer to "Patient Self Inventory" in the Media tab for reviewed elements of pertinent patient history.  [name] participated in the discussions, expressed understanding, and voiced agreement with the above plans.  All questions were answered to his satisfaction. he is encouraged to contact clinic should he have any questions or concerns prior to his return visit.     Follow up plan: - Return in about 2 weeks (around 12/29/2020) for Diabetes F/U, Bring meter and logs, No previsit labs.    Rayetta Pigg, [facility] [address] [place], [place] [address] Phone: [phone] Fax: [phone]  12/15/2020, 1:04 PM

## 2020-12-15 NOTE — Patient Instructions (Signed)
Diabetes Mellitus and Nutrition, Adult When you have diabetes, or diabetes mellitus, it is very important to have healthy eating habits because your blood sugar (glucose) levels are greatly affected by what you eat and drink. Eating healthy foods in the right amounts, at about the same times every day, can help you:  Control your blood glucose.  Lower your risk of heart disease.  Improve your blood pressure.  Reach or maintain a healthy weight. What can affect my meal plan? Every person with diabetes is different, and each person has different needs for a meal plan. Your health care provider may recommend that you work with a dietitian to make a meal plan that is best for you. Your meal plan may vary depending on factors such as:  The calories you need.  The medicines you take.  Your weight.  Your blood glucose, blood pressure, and cholesterol levels.  Your activity level.  Other health conditions you have, such as heart or kidney disease. How do carbohydrates affect me? Carbohydrates, also called carbs, affect your blood glucose level more than any other type of food. Eating carbs naturally raises the amount of glucose in your blood. Carb counting is a method for keeping track of how many carbs you eat. Counting carbs is important to keep your blood glucose at a healthy level, especially if you use insulin or take certain oral diabetes medicines. It is important to know how many carbs you can safely have in each meal. This is different for every person. Your dietitian can help you calculate how many carbs you should have at each meal and for each snack. How does alcohol affect me? Alcohol can cause a sudden decrease in blood glucose (hypoglycemia), especially if you use insulin or take certain oral diabetes medicines. Hypoglycemia can be a life-threatening condition. Symptoms of hypoglycemia, such as sleepiness, dizziness, and confusion, are similar to symptoms of having too much  alcohol.  Do not drink alcohol if: ? Your health care provider tells you not to drink. ? You are pregnant, may be pregnant, or are planning to become pregnant.  If you drink alcohol: ? Do not drink on an empty stomach. ? Limit how much you use to:  0-1 drink a day for women.  0-2 drinks a day for men. ? Be aware of how much alcohol is in your drink. In the [place] one drink equals one 12 oz bottle of beer (355 mL), one 5 oz glass of wine (148 mL), or one 1 oz glass of hard liquor (44 mL). ? Keep yourself hydrated with water, diet soda, or unsweetened iced tea.  Keep in mind that regular soda, juice, and other mixers may contain a lot of sugar and must be counted as carbs. What are tips for following this plan? Reading food labels  Start by checking the serving size on the "Nutrition Facts" label of packaged foods and drinks. The amount of calories, carbs, fats, and other nutrients listed on the label is based on one serving of the item. Many items contain more than one serving per package.  Check the total grams (g) of carbs in one serving. You can calculate the number of servings of carbs in one serving by dividing the total carbs by 15. For example, if a food has 30 g of total carbs per serving, it would be equal to 2 servings of carbs.  Check the number of grams (g) of saturated fats and trans fats in one serving. Choose foods that have   a low amount or none of these fats.  Check the number of milligrams (mg) of salt (sodium) in one serving. Most people should limit total sodium intake to less than 2,300 mg per day.  Always check the nutrition information of foods labeled as "low-fat" or "nonfat." These foods may be higher in added sugar or refined carbs and should be avoided.  Talk to your dietitian to identify your daily goals for nutrients listed on the label. Shopping  Avoid buying canned, pre-made, or processed foods. These foods tend to be high in fat, sodium, and added  sugar.  Shop around the outside edge of the grocery store. This is where you will most often find fresh fruits and vegetables, bulk grains, fresh meats, and fresh dairy. Cooking  Use low-heat cooking methods, such as baking, instead of high-heat cooking methods like deep frying.  Cook using healthy oils, such as olive, canola, or sunflower oil.  Avoid cooking with butter, cream, or high-fat meats. Meal planning  Eat meals and snacks regularly, preferably at the same times every day. Avoid going long periods of time without eating.  Eat foods that are high in fiber, such as fresh fruits, vegetables, beans, and whole grains. Talk with your dietitian about how many servings of carbs you can eat at each meal.  Eat 4-6 oz (112-168 g) of lean protein each day, such as lean meat, chicken, fish, eggs, or tofu. One ounce (oz) of lean protein is equal to: ? 1 oz (28 g) of meat, chicken, or fish. ? 1 egg. ?  cup (62 g) of tofu.  Eat some foods each day that contain healthy fats, such as avocado, nuts, seeds, and fish.   What foods should I eat? Fruits Berries. Apples. Oranges. Peaches. Apricots. Plums. Grapes. Mango. Papaya. Pomegranate. Kiwi. Cherries. Vegetables Lettuce. Spinach. Leafy greens, including kale, chard, collard greens, and mustard greens. Beets. Cauliflower. Cabbage. Broccoli. Carrots. Green beans. Tomatoes. Peppers. Onions. Cucumbers. Brussels sprouts. Grains Whole grains, such as whole-wheat or whole-grain bread, crackers, tortillas, cereal, and pasta. Unsweetened oatmeal. Quinoa. Brown or wild rice. Meats and other proteins Seafood. Poultry without skin. Lean cuts of poultry and beef. Tofu. Nuts. Seeds. Dairy Low-fat or fat-free dairy products such as milk, yogurt, and cheese. The items listed above may not be a complete list of foods and beverages you can eat. Contact a dietitian for more information. What foods should I avoid? Fruits Fruits canned with  syrup. Vegetables Canned vegetables. Frozen vegetables with butter or cream sauce. Grains Refined white flour and flour products such as bread, pasta, snack foods, and cereals. Avoid all processed foods. Meats and other proteins Fatty cuts of meat. Poultry with skin. Breaded or fried meats. Processed meat. Avoid saturated fats. Dairy Full-fat yogurt, cheese, or milk. Beverages Sweetened drinks, such as soda or iced tea. The items listed above may not be a complete list of foods and beverages you should avoid. Contact a dietitian for more information. Questions to ask a health care provider  Do I need to meet with a diabetes educator?  Do I need to meet with a dietitian?  What number can I call if I have questions?  When are the best times to check my blood glucose? Where to find more information:  American Diabetes Association: diabetes.org  Academy of Nutrition and Dietetics: www.eatright.org  National Institute of Diabetes and Digestive and Kidney Diseases: www.niddk.nih.gov  Association of Diabetes Care and Education Specialists: www.diabeteseducator.org Summary  It is important to have healthy eating   habits because your blood sugar (glucose) levels are greatly affected by what you eat and drink.  A healthy meal plan will help you control your blood glucose and maintain a healthy lifestyle.  Your health care provider may recommend that you work with a dietitian to make a meal plan that is best for you.  Keep in mind that carbohydrates (carbs) and alcohol have immediate effects on your blood glucose levels. It is important to count carbs and to use alcohol carefully. This information is not intended to replace advice given to you by your health care provider. Make sure you discuss any questions you have with your health care provider. Document Revised: 06/04/2019 Document Reviewed: 06/04/2019 Elsevier Patient Education  2021 [employer]  

## 2020-12-30 ENCOUNTER — Ambulatory Visit: Payer: Managed Care, Other (non HMO) | Admitting: Nurse Practitioner

## 2021-01-01 ENCOUNTER — Encounter: Payer: Self-pay | Admitting: Nurse Practitioner

## 2021-01-01 ENCOUNTER — Ambulatory Visit: Payer: Managed Care, Other (non HMO) | Admitting: Nurse Practitioner

## 2021-01-01 ENCOUNTER — Encounter: Payer: Self-pay | Admitting: Dietician

## 2021-01-01 ENCOUNTER — Other Ambulatory Visit: Payer: Self-pay

## 2021-01-01 ENCOUNTER — Encounter: Payer: Managed Care, Other (non HMO) | Attending: Nurse Practitioner | Admitting: Dietician

## 2021-01-01 VITALS — BP 167/99 | HR 102 | Ht 72.0 in | Wt 306.0 lb

## 2021-01-01 DIAGNOSIS — I10 Essential (primary) hypertension: Secondary | ICD-10-CM | POA: Diagnosis not present

## 2021-01-01 DIAGNOSIS — E1165 Type 2 diabetes mellitus with hyperglycemia: Secondary | ICD-10-CM

## 2021-01-01 DIAGNOSIS — E782 Mixed hyperlipidemia: Secondary | ICD-10-CM

## 2021-01-01 MED ORDER — RYBELSUS 14 MG PO TABS: 14.0000 mg | ORAL_TABLET | Freq: Every day | ORAL | 1 refills | Status: DC

## 2021-01-01 NOTE — Patient Instructions (Signed)

## 2021-01-01 NOTE — Progress Notes (Signed)
Medical Nutrition Therapy  Appointment Start time:  44  Appointment End time:  1215  Primary concerns today: Diabetes  Referral diagnosis: E11.65 Type 2 diabetes mellitus with hyperglycemia Preferred learning style:  No preference indicated Learning readiness:  Not ready   NUTRITION ASSESSMENT   Anthropometrics  Wt: 306 lbs Ht: 6' Body mass index is 41.5 kg/m.    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 Pt reports having rheumatoid arthritis, and had to take prednisone due to inflammation. Pt states they believe this is what caused their A1c to jump up from 6.9 to 11.3 in the last 6 months. Pt reports only eating twice a day, usually not until after midnight. Pt reports enjoying fried foods. Avoids sodas.  Pt occasionally has a beer or two and shots of liquor.  Pt was previously on metformin, had severe GI side effects. Pt was also on Farxiga, but was switched to Rybelsus. Pt reports no side effects currently with Glipizide.  Pt is checking BG irregularly. Highs of >400.   Estimated daily fluid intake: 64 oz Supplements: Folic Acid Sleep: Doesn't sleep much, naps Stress / self-care: Moderate  Current average weekly physical activity: ADLs, truck driver   73-ZH Dietary Recall First Meal: Fried fish, 2 slices of wheat bread Snack: none Second Meal: [place] food beef burrito, 4 or 5 bites, shrimp and rice Snack: Beers, shots of liquor Third Meal: none Snack: none Beverages: Beer, couple shots of liquor, Diet sundrop   NUTRITION DIAGNOSIS  NB-1.1 Food and nutrition-related knowledge deficit As related to Diabetes.  As evidenced by A1c of 11.3, skipping meals,sedentary lifestyle, and consumption of energy dense packaged foods..   NUTRITION INTERVENTION  Nutrition  education (E-1) on the following topics:  Educated patient on the pathophysiology of diabetes. This includes why our bodies need circulating blood sugar, the relationship between insulin and blood sugar, and the results of insulin resistance and/or pancreatic insufficiency on the development of diabetes. Educated patient on factors that contribute to elevation of blood sugars, such as stress, illness, injury,and food choices. Discussed the role that physical activity plays in lowering blood sugar. Educate patient on the three main macronutrients. Protein, fats, and carbohydrates. Discussed how each of these macronutrients affect blood sugar levels, especially carbohydrate, and the importance of eating a consistent amount of carbohydrate throughout the day.  Educated patient on the balanced plate eating model. Recommended lunch and dinner be 1/2 non-starchy vegetables, 1/4 starches, and 1/4 protein. Recommended breakfast be a balance of starch and protein with a piece of fruit. Discussed with patient the importance of working towards hitting the proportions of the balanced plate consistently.   Handouts Provided Include  MyPlate   Learning Style & Readiness for Change Teaching method utilized: Visual & Auditory  Demonstrated degree of understanding via: Teach Back  Barriers to learning/adherence to lifestyle change: Apathy to change, denial  Goals Established by Pt Work towards eating three meals a day, about 5-6 hours apart! Try to eat these meals during the day as follows Breakfast:7-8 pm Lunch: 12-2 pm Dinner: 6-7 pm Begin to recognize carbohydrates in your food choices! Begin to build your meals using the proportions of the Balanced Plate. First, select your carb choice(s) for the meal. Next, select your source of protein to pair with your carb choice(s). Finally, complete the remaining half of your meal  with a variety of non-starchy vegetables.   MONITORING & EVALUATION Dietary intake,  weekly physical activity, and meal pattern PRN  Next Steps  Patient is to follow up PRN.

## 2021-01-01 NOTE — Progress Notes (Signed)
Endocrinology Follow Up Note       01/01/2021, 10:54 AM   Subjective:    Patient ID: Derek Owen, male    DOB: 1965/08/21.  Derek Owen is being seen in follow up after being seen in consultation for management of currently uncontrolled symptomatic diabetes requested by  Noreene Larsson, NP.   Past Medical History:  Diagnosis Date   Alcohol use disorder, mild, abuse    Cigarette smoker    Diabetes mellitus without complication ([place])    Hyperlipidemia    Hypertension    Polycythemia 04/13/2020   Stroke ([place])    x3    History reviewed. No pertinent surgical history.  Social History   Socioeconomic History   Marital status: Single    Spouse name: Not on file   Number of children: Not on file   Years of education: Not on file   Highest education level: Not on file  Occupational History   Not on file  Tobacco Use   Smoking status: Every Day    Packs/day: 1.00    Pack years: 0.00    Types: Cigarettes   Smokeless tobacco: Never  Vaping Use   Vaping Use: Never used  Substance and Sexual Activity   Alcohol use: Not Currently   Drug use: Not Currently   Sexual activity: Not Currently    Birth control/protection: Other-see comments  Other Topics Concern   Not on file  Social History Narrative   Not on file   Social Determinants of Health   Financial Resource Strain: Not on file  Food Insecurity: Not on file  Transportation Needs: Not on file  Physical Activity: Not on file  Stress: Not on file  Social Connections: Not on file    Family History  Adopted: Yes    Outpatient Encounter Medications as of 01/01/2021  Medication Sig   acetaminophen (TYLENOL) 500 MG tablet Take 1 tablet (500 mg total) by mouth every 6 (six) hours as needed.   amLODipine (NORVASC) 10 MG tablet Take 1 tablet (10 mg total) by mouth daily.   aspirin 81 MG chewable tablet Chew by mouth daily.   atorvastatin  (LIPITOR) 40 MG tablet Take 1 tablet (40 mg total) by mouth daily.   colchicine 0.6 MG tablet Take 1 tablet (0.6 mg total) by mouth daily as needed. Take 2 tabs now and 1 tablet in 2 hours if symptoms persist   diclofenac Sodium (VOLTAREN) 1 % GEL Apply 2 g topically 4 (four) times daily.   folic acid (FOLVITE) 1 MG tablet Take 1 tablet (1 mg total) by mouth daily.   glipiZIDE (GLUCOTROL) 10 MG tablet Take 10 mg by mouth daily before breakfast.   ibuprofen (ADVIL) 800 MG tablet Take 1 tablet (800 mg total) by mouth every 8 (eight) hours as needed.   losartan-hydrochlorothiazide (HYZAAR) 100-25 MG tablet Take 1 tablet by mouth daily.   methotrexate (RHEUMATREX) 2.5 MG tablet Take 6 tablets (15 mg total) by mouth once a week. Caution:Chemotherapy. Protect from light.   predniSONE (DELTASONE) 5 MG tablet Take 1 tablet (5 mg total) by mouth daily.   [START ON 01/17/2021] Semaglutide (RYBELSUS) 14 MG TABS Take 14 mg  by mouth daily.   [DISCONTINUED] Semaglutide (RYBELSUS) 14 MG TABS Take 14 mg by mouth daily. (Patient not taking: Reported on 12/15/2020)   [DISCONTINUED] Semaglutide (RYBELSUS) 7 MG TABS Take 7 mg by mouth daily. (Patient not taking: Reported on 12/15/2020)   No facility-administered encounter medications on file as of 01/01/2021.    ALLERGIES: No Known Allergies  VACCINATION STATUS: Immunization History  Administered Date(s) Administered   Tdap 10/08/2020    Diabetes He presents for his follow-up diabetic visit. He has type 2 diabetes mellitus. Onset time: Diagnosed at approx age of 3. His disease course has been improving. There are no hypoglycemic associated symptoms. Associated symptoms include blurred vision, fatigue, polydipsia and polyuria. There are no hypoglycemic complications. Symptoms are stable. Diabetic complications include a CVA, impotence, nephropathy and peripheral neuropathy. Risk factors for coronary artery disease include diabetes mellitus, dyslipidemia, family  history, obesity, male sex, hypertension, sedentary lifestyle and tobacco exposure. Current diabetic treatment includes oral agent (dual therapy). He is compliant with treatment most of the time. His weight is increasing steadily. He is following a generally unhealthy diet. When asked about meal planning, he reported none. He has not had a previous visit with a dietitian. He never participates in exercise. His home blood glucose trend is decreasing steadily. His breakfast blood glucose range is generally >200 mg/dl. His bedtime blood glucose range is generally 180-200 mg/dl. (He presents today with his meter and logs showing slightly improved glycemic profile.  Analysis of his meter shows 7-day average of 224, 14-day average of 245, 30-day average of 250.  He does report he has "cheated" on his diet and still consumes things he knows he shouldn't.  He has tolerated the Rybelsus well and reports no hypoglycemia.) An ACE inhibitor/angiotensin II receptor blocker is being taken. He does not see a podiatrist.Eye exam is current.  Hypertension This is a chronic problem. The current episode started more than 1 year ago. The problem has been waxing and waning since onset. The problem is uncontrolled. Associated symptoms include blurred vision. There are no associated agents to hypertension. Risk factors for coronary artery disease include diabetes mellitus, dyslipidemia, family history, obesity, male gender, sedentary lifestyle and smoking/tobacco exposure. Past treatments include angiotensin blockers, diuretics and calcium channel blockers. Compliance problems include exercise and diet.  Hypertensive end-organ damage includes kidney disease and CVA. Identifiable causes of hypertension include chronic renal disease.  Hyperlipidemia This is a chronic problem. The current episode started more than 1 year ago. The problem is uncontrolled. Recent lipid tests were reviewed and are high. Exacerbating diseases include chronic  renal disease, diabetes and obesity. Factors aggravating his hyperlipidemia include fatty foods, thiazides and smoking. Current antihyperlipidemic treatment includes statins. The current treatment provides moderate improvement of lipids. Compliance problems include adherence to diet and adherence to exercise.  Risk factors for coronary artery disease include diabetes mellitus, dyslipidemia, family history, hypertension, male sex, obesity and a sedentary lifestyle.    Review of systems  Constitutional: + steadily increasing body weight, current Body mass index is 41.5 kg/m., + fatigue, no subjective hyperthermia, no subjective hypothermia Eyes: + blurry vision-due for eye exam soon, no xerophthalmia ENT: no sore throat, no nodules palpated in throat, no dysphagia/odynophagia, no hoarseness Cardiovascular: no chest pain, no shortness of breath, no palpitations, no leg swelling Respiratory: no cough, no shortness of breath Gastrointestinal: no nausea/vomiting/diarrhea Genitourinary: + polyuria, difficulty achieving an erection Musculoskeletal: no muscle/joint aches Skin: no rashes, no hyperemia Neurological: no tremors, no numbness, no tingling, no  dizziness Psychiatric: no depression, no anxiety  Objective:     BP (!) 167/99   Pulse (!) 102   Ht 6' (1.829 m)   Wt (!) 306 lb (138.8 kg)   BMI 41.50 kg/m   Wt Readings from Last 3 Encounters:  01/01/21 (!) 306 lb (138.8 kg)  12/15/20 299 lb (135.6 kg)  11/17/20 (!) 303 lb (137.4 kg)     BP Readings from Last 3 Encounters:  01/01/21 (!) 167/99  12/15/20 117/79  11/17/20 (!) 144/91     Physical Exam- Limited  Constitutional:  Body mass index is 41.5 kg/m. , not in acute distress, normal state of mind Eyes:  EOMI, no exophthalmos Neck: Supple Cardiovascular: RRR, no murmurs, rubs, or gallops, no edema Respiratory: Adequate breathing efforts, no crackles, rales, rhonchi, or wheezing Musculoskeletal: no gross deformities, strength  intact in all four extremities, no gross restriction of joint movements Skin:  no rashes, no hyperemia Neurological: no tremor with outstretched hands   Foot exam:  No rashes, ulcers, cuts, + calluses, no onychodystrophy. Toenails in need of trim (curling under) ? Fungal infection between toes  Good pulses bilat. Good sensation to 10 g monofilament bilat.   CMP ( most recent) CMP     Component Value Date/Time   NA 136 11/20/2020 0847   K 4.7 11/20/2020 0847   CL 97 11/20/2020 0847   CO2 24 11/20/2020 0847   GLUCOSE 257 (H) 11/20/2020 0847   GLUCOSE 279 (H) 11/02/2020 1422   BUN 17 11/20/2020 0847   CREATININE 1.00 11/20/2020 0847   CREATININE 1.25 11/02/2020 1422   CALCIUM 9.9 11/20/2020 0847   PROT 7.5 11/20/2020 0847   ALBUMIN 4.3 11/20/2020 0847   AST 11 11/20/2020 0847   ALT 20 11/20/2020 0847   ALKPHOS 94 11/20/2020 0847   BILITOT 0.4 11/20/2020 0847   GFRNONAA 64 11/02/2020 1422   GFRAA 74 11/02/2020 1422     Diabetic Labs (most recent): Lab Results  Component Value Date   HGBA1C 11.3 (H) 11/20/2020   HGBA1C 6.9 (H) 06/01/2020     Lipid Panel ( most recent) Lipid Panel     Component Value Date/Time   CHOL 202 (H) 11/20/2020 0847   TRIG 127 11/20/2020 0847   HDL 51 11/20/2020 0847   LDLCALC 128 (H) 11/20/2020 0847   LABVLDL 23 11/20/2020 0847      No results found for: TSH, FREET4         Assessment & Plan:   1) Type 2 diabetes mellitus with hyperglycemia, without long-term current use of insulin ([place])  He presents today with his meter and logs showing slightly improved glycemic profile.  Analysis of his meter shows 7-day average of 224, 14-day average of 245, 30-day average of 250.  He does report he has "cheated" on his diet and still consumes things he knows he shouldn't.  He has tolerated the Rybelsus well and reports no hypoglycemia.  - JURGEN GROENEVELD has currently uncontrolled symptomatic type 2 DM since 55 years of age, with most  recent A1c of 11.3 %.   -Recent labs reviewed.  - I had a long discussion with him about the progressive nature of diabetes and the pathology behind its complications. -his diabetes is complicated by CKD, CVA and he remains at a high risk for more acute and chronic complications which include CAD, retinopathy, and neuropathy. These are all discussed in detail with him.  - Nutritional counseling repeated at each appointment due to patients tendency  to fall back in to old habits.  - The patient admits there is a room for improvement in their diet and drink choices. -  Suggestion is made for the patient to avoid simple carbohydrates from their diet including Cakes, Sweet Desserts / Pastries, Ice Cream, Soda (diet and regular), Sweet Tea, Candies, Chips, Cookies, Sweet Pastries, Store Bought Juices, Alcohol in Excess of 1-2 drinks a day, Artificial Sweeteners, Coffee Creamer, and "Sugar-free" Products. This will help patient to have stable blood glucose profile and potentially avoid unintended weight gain.   - I encouraged the patient to switch to unprocessed or minimally processed complex starch and increased protein intake (animal or plant source), fruits, and vegetables.   - Patient is advised to stick to a routine mealtimes to eat 3 meals a day and avoid unnecessary snacks (to snack only to correct hypoglycemia).  - he will be scheduled with Jearld Fenton, RDN, CDE for diabetes education.  - I have approached him with the following individualized plan to manage his diabetes and patient agrees:   -He will tolerate increase of his Rybelsus to 14 mg po daily and can continue his Glipizide 10 mg po daily with breakfast.    -he is encouraged to continue monitoring blood glucose twice daily, before breakfast and before bed, and to call the clinic if he has readings less than 70 or greater than 300 for 3 tests in a row.   - Adjustment parameters are given to him for hypo and hyperglycemia in  writing.  - he is not a candidate for Metformin as he did not tolerate it in the past due to GI side effects.  - Specific targets for  A1c; LDL, HDL, and Triglycerides were discussed with the patient.  2) Blood Pressure /Hypertension:  his blood pressure is not controlled to target.   he is advised to continue his current medications including Norvasc 10 mg po daily, and Losartan-HCT 100-25 mg po daily.  3) Lipids/Hyperlipidemia:    Review of his recent lipid panel from 11/20/20 showed uncontrolled LDL at 128 .  he is advised to continue Lipitor 40 mg p.o. daily at bedtime.  Side effects and precautions discussed with him.  4)  Weight/Diet:  his Body mass index is 41.5 kg/m.  -  clearly complicating his diabetes care.   he is a candidate for weight loss. I discussed with him the fact that loss of 5 - 10% of his  current body weight will have the most impact on his diabetes management.  Exercise, and detailed carbohydrates information provided  -  detailed on discharge instructions.  5) Chronic Care/Health Maintenance: -he is on ACEI/ARB and Statin medications and is encouraged to initiate and continue to follow up with Ophthalmology, Dentist, Podiatrist at least yearly or according to recommendations, and advised to [place]. I have recommended yearly flu vaccine and pneumonia vaccine at least every 5 years; moderate intensity exercise for up to 150 minutes weekly; and sleep for at least 7 hours a day.  - he is advised to maintain close follow up with Noreene Larsson, NP for primary care needs, as well as his other providers for optimal and coordinated care.     I spent 33 minutes in the care of the patient today including review of labs from [place], Lipids, Thyroid Function, Hematology (current and previous including abstractions from other facilities); face-to-face time discussing  his blood glucose readings/logs, discussing hypoglycemia and hyperglycemia episodes and symptoms, medications  doses, his options of  short and long term treatment based on the latest standards of care / guidelines;  discussion about incorporating lifestyle medicine;  and documenting the encounter.    Please refer to Patient Instructions for Blood Glucose Monitoring and Insulin/Medications Dosing Guide"  in media tab for additional information. Please  also refer to " Patient Self Inventory" in the Media  tab for reviewed elements of pertinent patient history.  [name] participated in the discussions, expressed understanding, and voiced agreement with the above plans.  All questions were answered to his satisfaction. he is encouraged to contact clinic should he have any questions or concerns prior to his return visit.     Follow up plan: - Return in about 3 months (around 04/03/2021) for Diabetes F/U with A1c in office, Bring meter and logs.    Rayetta Pigg, [facility] [address] [place], [place] [address] Phone: [phone] Fax: [phone]  01/01/2021, 10:54 AM

## 2021-01-01 NOTE — Patient Instructions (Addendum)
Work towards eating three meals a day, about 5-6 hours apart! Try to eat these meals during the day as follows Breakfast:7-8 pm Lunch: 12-2 pm Dinner: 6-7 pm  Begin to recognize carbohydrates in your food choices!  Begin to build your meals using the proportions of the Balanced Plate. First, select your carb choice(s) for the meal. Next, select your source of protein to pair with your carb choice(s). Finally, complete the remaining half of your meal with a variety of non-starchy vegetables.  Check your blood sugar each morning before you eat Look for your numbers under 125 Check your blood sugar 2 hours after your biggest meal Look for numbers under 180

## 2021-01-10 ENCOUNTER — Other Ambulatory Visit: Payer: Self-pay | Admitting: Internal Medicine

## 2021-01-10 DIAGNOSIS — M0580 Other rheumatoid arthritis with rheumatoid factor of unspecified site: Secondary | ICD-10-CM

## 2021-01-12 ENCOUNTER — Encounter: Payer: Self-pay | Admitting: Internal Medicine

## 2021-01-12 ENCOUNTER — Ambulatory Visit: Payer: Managed Care, Other (non HMO) | Admitting: Internal Medicine

## 2021-01-12 ENCOUNTER — Other Ambulatory Visit: Payer: Self-pay

## 2021-01-12 VITALS — BP 129/91 | HR 101 | Ht 72.0 in | Wt 306.0 lb

## 2021-01-12 DIAGNOSIS — M0580 Other rheumatoid arthritis with rheumatoid factor of unspecified site: Secondary | ICD-10-CM

## 2021-01-12 DIAGNOSIS — Z79899 Other long term (current) drug therapy: Secondary | ICD-10-CM | POA: Diagnosis not present

## 2021-01-12 DIAGNOSIS — M109 Gout, unspecified: Secondary | ICD-10-CM | POA: Diagnosis not present

## 2021-01-12 NOTE — Progress Notes (Signed)
Office Visit Note  Patient: Derek Owen             Date of Birth: [birth year]           MRN: [id number]             PCP: Noreene Larsson, NP Referring: Noreene Larsson, NP Visit Date: 01/12/2021   Subjective:  Follow-up (Patient is doing well overall. Patient is taking MTX 15 mg once weekly and Prednisone 5 mg daily. )   History of Present Illness: Derek Owen is a 55 y.o. 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  He feels his symptoms have come and gone with varying severity from day-to-day but overall reasonably controlled and today is a pretty good day.  His blood sugar has been much more elevated than usual lately with PCP and endocrinology visit uncontrolled Hgb A1c 11.3%. 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  Previous HPI: 11/02/20 Derek Owen is a 55 y.o. male here for seropositive rheumatoid arthritis on methotrexate 15 mg PO weekly and prednisone 10 mg daily. Since his last visit symptoms are partially improved. He still has some left wrist and knee pain but not much swelling. He stopped taking allopurinol due to suspicion being for RA based on lab findings.   10/12/20 Derek Owen is a 55 y.o. male with a history of gout, T2DM, CVA here for evaluation of joint pains with elevated RBC count, CRP, ANA, and RF. His current problems started about 2 months ago with increased pain in the hand, wrist, and knees. He has not improved much with use of colchicine and had less than 1 week improvement with intraarticular steroids. He saw Dr. 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   Labs reviewed 09/2020 RF 66.2 ESR 20 Uric acid 6.1    Imaging reviewed 09/2020 Xray left knee - Impression: Moderate left knee arthritis Xray left wrist - Impression: Normal left wrist.  Review of Systems  Constitutional:  Negative for fatigue.  HENT:  Negative for mouth sores, mouth dryness and nose dryness.   Eyes:  Negative for pain, itching, visual disturbance and dryness.  Respiratory:  Negative for cough, hemoptysis, shortness of breath and difficulty breathing.   Cardiovascular:  Negative for chest pain, palpitations and swelling in legs/feet.  Gastrointestinal:  Negative for abdominal pain, blood in stool, constipation and diarrhea.  Endocrine: Negative for increased urination.  Genitourinary:  Negative for painful urination.  Musculoskeletal:  Positive for joint pain and joint pain. Negative for joint swelling, myalgias, muscle weakness, morning stiffness, muscle tenderness and myalgias.  Skin:  Negative for color change, rash and redness.  Allergic/Immunologic: Negative for susceptible to infections.  Neurological:  Negative for dizziness, numbness, headaches, memory loss and weakness.  Hematological:  Negative for swollen glands.  Psychiatric/Behavioral:  Negative for confusion and sleep disturbance.    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 No pertinent surgical history. Social History   Social History Narrative   Not on file   Immunization History  Administered Date(s) Administered   Tdap 10/08/2020     Objective: Vital Signs: BP (!) 129/91 (BP Location: Left Arm, Patient Position: Sitting, Cuff Size: Normal)   Pulse (!) 101   Ht 6' (1.829 m)   Wt (!) 306 lb (138.8 kg)   BMI 41.50 kg/m    Physical Exam Constitutional:      Appearance: He is obese.  Eyes:     Conjunctiva/sclera: Conjunctivae normal.  Skin:    General: Skin is warm and dry.     Findings: No rash.  Neurological:     Mental Status: He is alert.  Psychiatric:        Mood and Affect: Mood normal.     Musculoskeletal Exam:  Shoulders full ROM no tenderness or swelling Elbows full ROM no tenderness or swelling Wrists full ROM no tenderness or swelling Fingers full ROM no tenderness or swelling Knees bilateral crepitus, left knee reduced ROM without effusion minimally tender Ankles full ROM no tenderness or swelling  CDAI Exam: CDAI Score: 7  Patient Global: 40 mm; Provider Global: 20 mm Swollen: 0 ; Tender: 1  Joint Exam 01/12/2021      Right  Left  Knee      Tender     Investigation: No additional findings.  Imaging: No results found.  Recent Labs: Lab Results  Component Value Date   WBC 9.1 11/20/2020   HGB 13.2 11/20/2020   PLT 269 11/20/2020   NA 136 11/20/2020   K 4.7 11/20/2020   CL 97 11/20/2020   CO2 24 11/20/2020   GLUCOSE 257 (H) 11/20/2020   BUN 17 11/20/2020   CREATININE 1.00 11/20/2020   BILITOT 0.4 11/20/2020   ALKPHOS 94 11/20/2020   AST 11 11/20/2020   ALT 20 11/20/2020   PROT 7.5 11/20/2020   ALBUMIN 4.3 11/20/2020   CALCIUM 9.9 11/20/2020   GFRAA 74 11/02/2020    Speciality Comments: No specialty comments available.  Procedures:  No procedures performed Allergies: Patient has no known allergies.   Assessment / Plan:     Visit Diagnoses: Polyarthritis with positive  rheumatoid factor ([place]) - Plan: Sedimentation rate  Symptoms showed general improvement in particular he is walking without a cane showing improvement of left knee arthritis symptoms, despite having very advanced degenerative arthritis in the joint as well.  We will plan to continue the methotrexate 15 mg p.o. weekly.  Recommend him to discontinue the low-dose prednisone that is contributing to uncontrolled hyperglycemia.  If symptoms worsen with steroid withdrawal would recommend titration of methotrexate dose or addition of new DMARDs to spare the long-term steroid usage.  High risk medication use - Plan: CBC with Differential/Platelet, COMPLETE METABOLIC PANEL WITH GFR  Methotrexate toxicity monitoring checking CBC and CMP today now at 3 months since starting therapy.  Acute gout of left hand, unspecified cause  No recent acute flares of joint pain swelling or redness suggestive for gouty arthritis attack.  Orders: Orders Placed This Encounter  Procedures   CBC with Differential/Platelet   COMPLETE METABOLIC PANEL WITH GFR   Sedimentation rate    No orders of the defined types were placed in this encounter.    Follow-Up Instructions: Return in about 3 months (around 04/14/2021) for RA on MTX f/u 60mo.   CCollier Salina MD  Note - This record has been created using [employer]  Chart creation errors have been sought, but may not always  have been located. Such creation errors do not reflect on  the standard of medical care.

## 2021-01-13 LAB — CBC WITH DIFFERENTIAL/PLATELET
Absolute Monocytes: 781 cells/uL (ref 200–950)
Basophils Absolute: 47 cells/uL (ref 0–200)
Basophils Relative: 0.5 %
Eosinophils Absolute: 56 cells/uL (ref 15–500)
Eosinophils Relative: 0.6 %
HCT: 39.9 % (ref 38.5–50.0)
Hemoglobin: 12.3 g/dL — ABNORMAL LOW (ref 13.2–17.1)
Lymphs Abs: 1060 cells/uL (ref 850–3900)
MCH: 21.9 pg — ABNORMAL LOW (ref 27.0–33.0)
MCHC: 30.8 g/dL — ABNORMAL LOW (ref 32.0–36.0)
MCV: 71 fL — ABNORMAL LOW (ref 80.0–100.0)
Monocytes Relative: 8.4 %
Neutro Abs: 7356 cells/uL (ref 1500–7800)
Neutrophils Relative %: 79.1 %
Platelets: 262 10*3/uL (ref 140–400)
RBC: 5.62 10*6/uL (ref 4.20–5.80)
RDW: 19.1 % — ABNORMAL HIGH (ref 11.0–15.0)
Total Lymphocyte: 11.4 %
WBC: 9.3 10*3/uL (ref 3.8–10.8)

## 2021-01-13 LAB — COMPLETE METABOLIC PANEL WITH GFR
AG Ratio: 1.3 (calc) (ref 1.0–2.5)
ALT: 13 U/L (ref 9–46)
AST: 10 U/L (ref 10–35)
Albumin: 4 g/dL (ref 3.6–5.1)
Alkaline phosphatase (APISO): 81 U/L (ref 35–144)
BUN: 16 mg/dL (ref 7–25)
CO2: 29 mmol/L (ref 20–32)
Calcium: 9.6 mg/dL (ref 8.6–10.3)
Chloride: 100 mmol/L (ref 98–110)
Creat: 0.81 mg/dL (ref 0.70–1.33)
GFR, Est African American: 115 mL/min/{1.73_m2} (ref >=60)
GFR, Est Non African American: 99 mL/min/{1.73_m2} (ref >=60)
Globulin: 3 g/dL (calc) (ref 1.9–3.7)
Glucose, Bld: 143 mg/dL — ABNORMAL HIGH (ref 65–99)
Potassium: 4 mmol/L (ref 3.5–5.3)
Sodium: 139 mmol/L (ref 135–146)
Total Bilirubin: 0.6 mg/dL (ref 0.2–1.2)
Total Protein: 7 g/dL (ref 6.1–8.1)

## 2021-01-13 LAB — SEDIMENTATION RATE: Sed Rate: 6 mm/h (ref 0–20)

## 2021-01-14 NOTE — Progress Notes (Signed)
Blood tests look good no problems from the methotrexate.

## 2021-01-23 ENCOUNTER — Other Ambulatory Visit: Payer: Self-pay | Admitting: Internal Medicine

## 2021-01-23 DIAGNOSIS — M0580 Other rheumatoid arthritis with rheumatoid factor of unspecified site: Secondary | ICD-10-CM

## 2021-01-27 ENCOUNTER — Telehealth: Payer: Self-pay

## 2021-01-27 ENCOUNTER — Other Ambulatory Visit: Payer: Self-pay | Admitting: Nurse Practitioner

## 2021-01-27 MED ORDER — RYBELSUS 7 MG PO TABS: 7.0000 mg | ORAL_TABLET | Freq: Every day | ORAL | 1 refills | Status: DC

## 2021-01-27 NOTE — Telephone Encounter (Signed)
We are not prescribing that med. Derek Owen is. He needs to contact that office regarding his concerns

## 2021-01-27 NOTE — Telephone Encounter (Signed)
Called patient, will contact Derek Owen.

## 2021-01-27 NOTE — Telephone Encounter (Signed)
Patient called concerned if he is taking too much of this medication Semaglutide (RYBELSUS) 14 MG TABS was taking 3 mg. Call back # [phone]

## 2021-02-13 ENCOUNTER — Other Ambulatory Visit: Payer: Self-pay | Admitting: Internal Medicine

## 2021-02-13 DIAGNOSIS — M0580 Other rheumatoid arthritis with rheumatoid factor of unspecified site: Secondary | ICD-10-CM

## 2021-02-15 ENCOUNTER — Ambulatory Visit: Payer: Managed Care, Other (non HMO) | Admitting: Nurse Practitioner

## 2021-03-03 ENCOUNTER — Ambulatory Visit: Payer: Managed Care, Other (non HMO) | Admitting: Nurse Practitioner

## 2021-03-04 ENCOUNTER — Ambulatory Visit: Payer: Managed Care, Other (non HMO) | Admitting: Nurse Practitioner

## 2021-03-17 ENCOUNTER — Ambulatory Visit: Payer: Managed Care, Other (non HMO) | Admitting: Nurse Practitioner

## 2021-03-17 ENCOUNTER — Other Ambulatory Visit: Payer: Self-pay

## 2021-03-17 ENCOUNTER — Encounter: Payer: Self-pay | Admitting: Nurse Practitioner

## 2021-03-17 DIAGNOSIS — I152 Hypertension secondary to endocrine disorders: Secondary | ICD-10-CM | POA: Diagnosis not present

## 2021-03-17 DIAGNOSIS — E1165 Type 2 diabetes mellitus with hyperglycemia: Secondary | ICD-10-CM | POA: Diagnosis not present

## 2021-03-17 DIAGNOSIS — E1159 Type 2 diabetes mellitus with other circulatory complications: Secondary | ICD-10-CM | POA: Diagnosis not present

## 2021-03-17 NOTE — Progress Notes (Signed)
Established Patient Office Visit  Subjective:  Patient ID: Derek Owen, male    DOB: 03-20-1966  Age: 55 y.o. MRN: [id number]  CC:  Chief Complaint  Patient presents with   Diabetes    Follow up    HPI [name] presents for follow-up for T2DM. HIs last A1c was 11.3 on 11/20/20. He is followed by endocrinology.  Past Medical History:  Diagnosis Date   Alcohol use disorder, mild, abuse    Cigarette smoker    Diabetes mellitus without complication ([place])    Hyperlipidemia    Hypertension    Polycythemia 04/13/2020   Stroke ([place])    x3    History reviewed. No pertinent surgical history.  Family History  Adopted: Yes    Social History   Socioeconomic History   Marital status: Single    Spouse name: Not on file   Number of children: Not on file   Years of education: Not on file   Highest education level: Not on file  Occupational History   Not on file  Tobacco Use   Smoking status: Every Day    Packs/day: 1.00    Types: Cigarettes   Smokeless tobacco: Never  Vaping Use   Vaping Use: Never used  Substance and Sexual Activity   Alcohol use: Yes    Comment: Occasional   Drug use: Not Currently   Sexual activity: Not Currently    Birth control/protection: Other-see comments  Other Topics Concern   Not on file  Social History Narrative   Not on file   Social Determinants of Health   Financial Resource Strain: Not on file  Food Insecurity: Not on file  Transportation Needs: Not on file  Physical Activity: Not on file  Stress: Not on file  Social Connections: Not on file  Intimate Partner Violence: Not on file    Outpatient Medications Prior to Visit  Medication Sig Dispense Refill   amLODipine (NORVASC) 10 MG tablet Take 1 tablet (10 mg total) by mouth daily. 90 tablet 3   aspirin 81 MG chewable tablet Chew by mouth daily.     atorvastatin (LIPITOR) 40 MG tablet Take 1 tablet (40 mg total) by mouth daily. 90 tablet 1   folic acid (FOLVITE) 1  MG tablet TAKE 1 TABLET BY MOUTH EVERY DAY 90 tablet 0   glipiZIDE (GLUCOTROL) 10 MG tablet Take 10 mg by mouth daily before breakfast.     ibuprofen (ADVIL) 800 MG tablet Take 1 tablet (800 mg total) by mouth every 8 (eight) hours as needed. 30 tablet 0   losartan-hydrochlorothiazide (HYZAAR) 100-25 MG tablet Take 1 tablet by mouth daily. 90 tablet 1   methotrexate (RHEUMATREX) 2.5 MG tablet TAKE 6 TABLETS (15 MG TOTAL) BY MOUTH ONCE A WEEK. CAUTION:CHEMOTHERAPY. PROTECT FROM LIGHT. 72 tablet 0   predniSONE (DELTASONE) 5 MG tablet TAKE 1 TABLET (5 MG TOTAL) BY MOUTH DAILY. 60 tablet 0   Semaglutide (RYBELSUS) 7 MG TABS Take 7 mg by mouth daily. 90 tablet 1   acetaminophen (TYLENOL) 500 MG tablet Take 1 tablet (500 mg total) by mouth every 6 (six) hours as needed. (Patient not taking: Reported on 03/17/2021) 30 tablet 0   colchicine 0.6 MG tablet Take 1 tablet (0.6 mg total) by mouth daily as needed. Take 2 tabs now and 1 tablet in 2 hours if symptoms persist (Patient not taking: Reported on 03/17/2021) 6 tablet 0   diclofenac Sodium (VOLTAREN) 1 % GEL Apply 2 g topically 4 (  four) times daily. (Patient not taking: Reported on 03/17/2021) 150 g 1   No facility-administered medications prior to visit.    No Known Allergies  ROS Review of Systems  Constitutional: Negative.   Eyes:  Positive for visual disturbance.       Left eye; loses focus, but then it gets better within minutes  Respiratory: Negative.    Cardiovascular: Negative.   Psychiatric/Behavioral: Negative.       Objective:    Physical Exam Constitutional:      Appearance: Normal appearance. He is obese.  Cardiovascular:     Rate and Rhythm: Normal rate and regular rhythm.     Pulses: Normal pulses.     Heart sounds: Normal heart sounds.  Pulmonary:     Effort: Pulmonary effort is normal.     Breath sounds: Normal breath sounds.  Neurological:     Mental Status: He is alert.  Psychiatric:        Mood and Affect: Mood  normal.        Behavior: Behavior normal.        Thought Content: Thought content normal.        Judgment: Judgment normal.    BP (!) 149/83 (BP Location: Right Arm, Patient Position: Sitting, Cuff Size: Large)   Pulse 100   Temp 98.8 F (37.1 C) (Oral)   Ht 6' (1.829 m)   Wt (!) 309 lb (140.2 kg)   SpO2 95%   BMI 41.91 kg/m  Wt Readings from Last 3 Encounters:  03/17/21 (!) 309 lb (140.2 kg)  01/12/21 (!) 306 lb (138.8 kg)  01/01/21 (!) 306 lb (138.8 kg)     Health Maintenance Due  Topic Date Due   PNEUMOCOCCAL POLYSACCHARIDE VACCINE AGE 53-64 HIGH RISK  Never done   Pneumococcal Vaccine 50-91 Years old (1 - PCV) Never done   OPHTHALMOLOGY EXAM  Never done   Zoster Vaccines- Shingrix (1 of 2) Never done    There are no preventive care reminders to display for this patient.  No results found for: TSH Lab Results  Component Value Date   WBC 9.3 01/12/2021   HGB 12.3 (L) 01/12/2021   HCT 39.9 01/12/2021   MCV 71.0 (L) 01/12/2021   PLT 262 01/12/2021   Lab Results  Component Value Date   NA 139 01/12/2021   K 4.0 01/12/2021   CO2 29 01/12/2021   GLUCOSE 143 (H) 01/12/2021   BUN 16 01/12/2021   CREATININE 0.81 01/12/2021   BILITOT 0.6 01/12/2021   ALKPHOS 94 11/20/2020   AST 10 01/12/2021   ALT 13 01/12/2021   PROT 7.0 01/12/2021   ALBUMIN 4.3 11/20/2020   CALCIUM 9.6 01/12/2021   EGFR 88 11/20/2020   Lab Results  Component Value Date   CHOL 202 (H) 11/20/2020   Lab Results  Component Value Date   HDL 51 11/20/2020   Lab Results  Component Value Date   LDLCALC 128 (H) 11/20/2020   Lab Results  Component Value Date   TRIG 127 11/20/2020   No results found for: [facility] Lab Results  Component Value Date   HGBA1C 11.3 (H) 11/20/2020      Assessment & Plan:   Problem List Items Addressed This Visit       Cardiovascular and Mediastinum   Hypertension associated with diabetes ([place])    BP Readings from Last 3 Encounters:  03/17/21 (!) 149/83   01/12/21 (!) 129/91  01/01/21 (!) 167/99  -BP slightly elevated today  Relevant Orders   CBC with Differential/Platelet   CMP14+EGFR   Lipid Panel With LDL/HDL Ratio     Endocrine   Type II diabetes mellitus, uncontrolled ([place])    -followed by endocrinology -questionable dietary compliance d/t alcohol consumption -takes prednisone daily for RA      Relevant Orders   CBC with Differential/Platelet   CMP14+EGFR   Lipid Panel With LDL/HDL Ratio    No orders of the defined types were placed in this encounter.   Follow-up: Return in about 6 months (around 09/14/2021) for Physical Exam (HLD, HTN).    Noreene Larsson, NP

## 2021-03-17 NOTE — Assessment & Plan Note (Signed)
-  followed by endocrinology -questionable dietary compliance d/t alcohol consumption -takes prednisone daily for RA

## 2021-03-17 NOTE — Assessment & Plan Note (Signed)
BP Readings from Last 3 Encounters:  03/17/21 (!) 149/83  01/12/21 (!) 129/91  01/01/21 (!) 167/99   -BP slightly elevated today

## 2021-03-17 NOTE — Patient Instructions (Signed)
Please have fasting labs drawn 2-3 days prior to your appointment so we can discuss the results during your office visit.  

## 2021-03-25 ENCOUNTER — Other Ambulatory Visit: Payer: Self-pay | Admitting: Internal Medicine

## 2021-03-25 DIAGNOSIS — M0580 Other rheumatoid arthritis with rheumatoid factor of unspecified site: Secondary | ICD-10-CM

## 2021-04-05 ENCOUNTER — Ambulatory Visit: Payer: Managed Care, Other (non HMO) | Admitting: Nurse Practitioner

## 2021-04-07 ENCOUNTER — Ambulatory Visit: Payer: Managed Care, Other (non HMO) | Admitting: Nurse Practitioner

## 2021-04-07 DIAGNOSIS — E782 Mixed hyperlipidemia: Secondary | ICD-10-CM

## 2021-04-07 DIAGNOSIS — I10 Essential (primary) hypertension: Secondary | ICD-10-CM

## 2021-04-07 DIAGNOSIS — E1165 Type 2 diabetes mellitus with hyperglycemia: Secondary | ICD-10-CM

## 2021-04-11 ENCOUNTER — Other Ambulatory Visit: Payer: Self-pay | Admitting: Nurse Practitioner

## 2021-04-11 DIAGNOSIS — E1165 Type 2 diabetes mellitus with hyperglycemia: Secondary | ICD-10-CM

## 2021-04-13 ENCOUNTER — Ambulatory Visit: Payer: Managed Care, Other (non HMO) | Admitting: Internal Medicine

## 2021-04-13 NOTE — Progress Notes (Deleted)
Office Visit Note  Patient: Derek Owen             Date of Birth: 04-16-1966           MRN: [id number]             PCP: Noreene Larsson, NP Referring: Noreene Larsson, NP Visit Date: 04/13/2021   Subjective:  No chief complaint on file.   History of Present Illness: Derek Owen is a 55 y.o. male here for follow up for seropositive RA on methotrexate 15 mg PO weekly and folic acid 1 mg daily. We discontinued prednisone 5 mg after the last visit which was contributing to hyperglycemia. ***   Previous HPI 01/12/21 Derek Owen is a 55 y.o. 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  He feels his symptoms have come and gone with varying severity from day-to-day but overall reasonably controlled and today is a pretty good day.  His blood sugar has been much more elevated than usual lately with PCP and endocrinology visit uncontrolled Hgb A1c 11.3%. 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   10/12/20 Derek Owen is a 55 y.o. male with a history of gout, T2DM, CVA here for evaluation of joint pains with elevated RBC count, CRP, ANA, and RF. His current problems started about 2 months ago with increased pain in the hand, wrist, and knees. He has not improved much with use of colchicine and had less than 1 week improvement with intraarticular steroids. He saw Dr. 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   Labs reviewed 09/2020 RF 66.2 ESR 20 Uric acid 6.1   No Rheumatology ROS completed.   PMFS History:  Patient Active Problem List   Diagnosis Date Noted   Type II diabetes mellitus, uncontrolled 11/17/2020   Hypertension associated with diabetes  ([place]) 11/17/2020   RBC microcytosis 10/22/2020   High risk medication use 10/13/2020   Polyarthritis with positive rheumatoid factor ([place]) 10/12/2020   Immunization due 10/08/2020   Gout 08/31/2020    Past Medical History:  Diagnosis Date   Alcohol use disorder, mild, abuse    Cigarette smoker    Diabetes mellitus without complication ([place])    Hyperlipidemia    Hypertension    Polycythemia 04/13/2020   Stroke ([place])    x3    Family History  Adopted: Yes   No past surgical history on file. Social History   Social History Narrative   Not on file   Immunization History  Administered Date(s) Administered   Tdap 10/08/2020     Objective: Vital Signs: There were no vitals taken for this visit.   Physical Exam   Musculoskeletal Exam: ***  CDAI Exam: CDAI Score: -- Patient Global: --; Provider Global: -- Swollen: --; Tender: -- Joint Exam 04/13/2021   No joint exam has been documented for this visit   There is currently no information documented on the homunculus. Go to the Rheumatology activity and complete the homunculus joint exam.  Investigation: No additional findings.  Imaging: No results found.  Recent Labs: Lab Results  Component Value Date   WBC 9.3 01/12/2021   HGB 12.3 (L)  01/12/2021   PLT 262 01/12/2021   NA 139 01/12/2021   K 4.0 01/12/2021   CL 100 01/12/2021   CO2 29 01/12/2021   GLUCOSE 143 (H) 01/12/2021   BUN 16 01/12/2021   CREATININE 0.81 01/12/2021   BILITOT 0.6 01/12/2021   ALKPHOS 94 11/20/2020   AST 10 01/12/2021   ALT 13 01/12/2021   PROT 7.0 01/12/2021   ALBUMIN 4.3 11/20/2020   CALCIUM 9.6 01/12/2021   GFRAA 115 01/12/2021    Speciality Comments: No specialty comments available.  Procedures:  No procedures performed Allergies: Patient has no known allergies.   Assessment / Plan:     Visit Diagnoses: No diagnosis found.  ***  Orders: No orders of the defined types were placed in this encounter.  No orders of  the defined types were placed in this encounter.    Follow-Up Instructions: No follow-ups on file.   Collier Salina, MD  Note - This record has been created using [employer].  Chart creation errors have been sought, but may not always  have been located. Such creation errors do not reflect on  the standard of medical care.

## 2021-04-20 LAB — HM DIABETES EYE EXAM

## 2021-04-23 NOTE — Patient Instructions (Signed)

## 2021-04-26 ENCOUNTER — Other Ambulatory Visit: Payer: Self-pay

## 2021-04-26 ENCOUNTER — Ambulatory Visit: Payer: Managed Care, Other (non HMO) | Admitting: Nurse Practitioner

## 2021-04-26 ENCOUNTER — Encounter: Payer: Self-pay | Admitting: Nurse Practitioner

## 2021-04-26 VITALS — BP 166/86 | HR 85 | Ht 72.0 in | Wt 319.8 lb

## 2021-04-26 DIAGNOSIS — E782 Mixed hyperlipidemia: Secondary | ICD-10-CM | POA: Diagnosis not present

## 2021-04-26 DIAGNOSIS — E1165 Type 2 diabetes mellitus with hyperglycemia: Secondary | ICD-10-CM | POA: Diagnosis not present

## 2021-04-26 DIAGNOSIS — I10 Essential (primary) hypertension: Secondary | ICD-10-CM

## 2021-04-26 LAB — POCT GLYCOSYLATED HEMOGLOBIN (HGB A1C): HbA1c, POC (controlled diabetic range): 8.7 % — AB (ref 0.0–7.0)

## 2021-04-26 MED ORDER — RYBELSUS 14 MG PO TABS: 14.0000 mg | ORAL_TABLET | Freq: Every day | ORAL | 3 refills | Status: AC

## 2021-04-26 NOTE — Progress Notes (Signed)
Endocrinology Follow Up Note       04/26/2021, 1:52 PM   Subjective:    Patient ID: Derek Owen, male    DOB: [birth year].  Derek Owen is being seen in follow up after being seen in consultation for management of currently uncontrolled symptomatic diabetes requested by  Noreene Larsson, NP.   Past Medical History:  Diagnosis Date   Alcohol use disorder, mild, abuse    Cigarette smoker    Diabetes mellitus without complication ([place])    Hyperlipidemia    Hypertension    Polycythemia 04/13/2020   Stroke ([place])    x3    History reviewed. No pertinent surgical history.  Social History   Socioeconomic History   Marital status: Single    Spouse name: Not on file   Number of children: Not on file   Years of education: Not on file   Highest education level: Not on file  Occupational History   Not on file  Tobacco Use   Smoking status: Every Day    Packs/day: 1.00    Types: Cigarettes   Smokeless tobacco: Never  Vaping Use   Vaping Use: Never used  Substance and Sexual Activity   Alcohol use: Yes    Comment: Occasional   Drug use: Not Currently   Sexual activity: Not Currently    Birth control/protection: Other-see comments  Other Topics Concern   Not on file  Social History Narrative   Not on file   Social Determinants of Health   Financial Resource Strain: Not on file  Food Insecurity: Not on file  Transportation Needs: Not on file  Physical Activity: Not on file  Stress: Not on file  Social Connections: Not on file    Family History  Adopted: Yes    Outpatient Encounter Medications as of 04/26/2021  Medication Sig   amLODipine (NORVASC) 10 MG tablet Take 1 tablet (10 mg total) by mouth daily.   aspirin 81 MG chewable tablet Chew by mouth daily.   atorvastatin (LIPITOR) 40 MG tablet Take 1 tablet (40 mg total) by mouth daily.   folic acid (FOLVITE) 1 MG tablet TAKE 1 TABLET BY  MOUTH EVERY DAY   glipiZIDE (GLUCOTROL) 10 MG tablet TAKE 1 TABLET BY MOUTH DAILY BEFORE BREAKFAST.   ibuprofen (ADVIL) 800 MG tablet Take 1 tablet (800 mg total) by mouth every 8 (eight) hours as needed.   losartan-hydrochlorothiazide (HYZAAR) 100-25 MG tablet Take 1 tablet by mouth daily.   methotrexate (RHEUMATREX) 2.5 MG tablet TAKE 6 TABLETS (15 MG TOTAL) BY MOUTH ONCE A WEEK. CAUTION:CHEMOTHERAPY. PROTECT FROM LIGHT.   Semaglutide (RYBELSUS) 14 MG TABS Take 14 mg by mouth daily.   [DISCONTINUED] Semaglutide (RYBELSUS) 7 MG TABS Take 7 mg by mouth daily.   No facility-administered encounter medications on file as of 04/26/2021.    ALLERGIES: No Known Allergies  VACCINATION STATUS: Immunization History  Administered Date(s) Administered   Tdap 10/08/2020    Diabetes He presents for his follow-up diabetic visit. He has type 2 diabetes mellitus. Onset time: Diagnosed at approx age of 10. His disease course has been improving. There are no hypoglycemic associated symptoms. Associated symptoms include blurred vision  and fatigue. Pertinent negatives for diabetes include no polydipsia, no polyuria and no weight loss. There are no hypoglycemic complications. Symptoms are improving. Diabetic complications include a CVA, impotence, nephropathy and peripheral neuropathy. Risk factors for coronary artery disease include diabetes mellitus, dyslipidemia, family history, obesity, male sex, hypertension, sedentary lifestyle and tobacco exposure. Current diabetic treatment includes oral agent (dual therapy). He is compliant with treatment most of the time. His weight is increasing steadily. He is following a generally unhealthy diet. When asked about meal planning, he reported none. He has not had a previous visit with a dietitian. He never participates in exercise. His home blood glucose trend is decreasing steadily. His breakfast blood glucose range is generally >200 mg/dl. His bedtime blood glucose range  is generally >200 mg/dl. (He presents today with his meter, no logs, showing above target fasting and postprandial glycemic profile.  His POCT A1c today is 8.7%, improving greatly from last visit of 11.3%.  He is on oral steroids for treatment of OA, will be seeing specialist again on Monday next week to discuss switching to something else.  He denies any hypoglycemia.  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 He does not see a podiatrist.Eye exam is current.  Hypertension This is a chronic problem. The current episode started more than 1 year ago. The problem has been waxing and waning since onset. The problem is uncontrolled. Associated symptoms include blurred vision. There are no associated agents to hypertension. Risk factors for coronary artery disease include diabetes mellitus, dyslipidemia, family history, obesity, male gender, sedentary lifestyle and smoking/tobacco exposure. Past treatments include angiotensin blockers, diuretics and calcium channel blockers. Compliance problems include exercise and diet.  Hypertensive end-organ damage includes kidney disease and CVA. Identifiable causes of hypertension include chronic renal disease.  Hyperlipidemia This is a chronic problem. The current episode started more than 1 year ago. The problem is uncontrolled. Recent lipid tests were reviewed and are high. Exacerbating diseases include chronic renal disease, diabetes and obesity. Factors aggravating his hyperlipidemia include fatty foods, thiazides and smoking. Current antihyperlipidemic treatment includes statins. The current treatment provides moderate improvement of lipids. Compliance problems include adherence to diet and adherence to exercise.  Risk factors for coronary artery disease include diabetes mellitus, dyslipidemia, family history, hypertension, male sex, obesity and a sedentary lifestyle.     Review of systems  Constitutional: + steadily increasing body weight, current Body mass index is 43.37 kg/m., + fatigue, no subjective hyperthermia, no subjective hypothermia Eyes: + blurry vision-due for eye exam soon, no xerophthalmia ENT: no sore throat, no nodules palpated in throat, no dysphagia/odynophagia, no hoarseness Cardiovascular: no chest pain, no shortness of breath, no palpitations, no leg swelling Respiratory: no cough, no shortness of breath Gastrointestinal: no nausea/vomiting/diarrhea Musculoskeletal: bilateral knee pain- on oral steroids for OA Skin: no rashes, no hyperemia Neurological: no tremors, no numbness, no tingling, no dizziness Psychiatric: no depression, no anxiety  Objective:     BP (!) 166/86   Pulse 85   Ht 6' (1.829 m)   Wt (!) 319 lb 12.8 oz (145.1 kg)   BMI 43.37 kg/m   Wt Readings from Last 3 Encounters:  04/26/21 (!) 319 lb 12.8 oz (145.1 kg)  03/17/21 (!) 309 lb (140.2 kg)  01/12/21 (!) 306 lb (138.8 kg)     BP Readings from Last 3 Encounters:  04/26/21 (!) 166/86  03/17/21 (!) 149/83  01/12/21 Marland Kitchen)  129/91      Physical Exam- Limited  Constitutional:  Body mass index is 43.37 kg/m. , not in acute distress, normal state of mind Eyes:  EOMI, no exophthalmos Neck: Supple Cardiovascular: RRR, no murmurs, rubs, or gallops, no edema Respiratory: Adequate breathing efforts, no crackles, rales, rhonchi, or wheezing Musculoskeletal: no gross deformities, strength intact in all four extremities, no gross restriction of joint movements Skin:  no rashes, no hyperemia Neurological: no tremor with outstretched hands   POCT ABI Results 04/26/21   Right ABI:  1.15      Left ABI:  1.20  Right leg systolic / diastolic: 938/182 mmHg Left leg systolic / diastolic: 993/716 mmHg  Arm systolic / diastolic: 967/89 mmHG  Detailed report will be scanned into patient chart.   CMP ( most recent) CMP     Component Value Date/Time   NA  139 01/12/2021 1344   NA 136 11/20/2020 0847   K 4.0 01/12/2021 1344   CL 100 01/12/2021 1344   CO2 29 01/12/2021 1344   GLUCOSE 143 (H) 01/12/2021 1344   BUN 16 01/12/2021 1344   BUN 17 11/20/2020 0847   CREATININE 0.81 01/12/2021 1344   CALCIUM 9.6 01/12/2021 1344   PROT 7.0 01/12/2021 1344   PROT 7.5 11/20/2020 0847   ALBUMIN 4.3 11/20/2020 0847   AST 10 01/12/2021 1344   ALT 13 01/12/2021 1344   ALKPHOS 94 11/20/2020 0847   BILITOT 0.6 01/12/2021 1344   BILITOT 0.4 11/20/2020 0847   GFRNONAA 99 01/12/2021 1344   GFRAA 115 01/12/2021 1344     Diabetic Labs (most recent): Lab Results  Component Value Date   HGBA1C 8.7 (A) 04/26/2021   HGBA1C 11.3 (H) 11/20/2020   HGBA1C 6.9 (H) 06/01/2020     Lipid Panel ( most recent) Lipid Panel     Component Value Date/Time   CHOL 202 (H) 11/20/2020 0847   TRIG 127 11/20/2020 0847   HDL 51 11/20/2020 0847   LDLCALC 128 (H) 11/20/2020 0847   LABVLDL 23 11/20/2020 0847      No results found for: TSH, FREET4         Assessment & Plan:   1) Type 2 diabetes mellitus with hyperglycemia, without long-term current use of insulin ([place])  He presents today with his meter, no logs, showing above target fasting and postprandial glycemic profile.  His POCT A1c today is 8.7%, improving greatly from last visit of 11.3%.  He is on oral steroids for treatment of OA, will be seeing specialist again on Monday next week to discuss switching to something else.  He denies any hypoglycemia.  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  Analysis of his meter shows 7-day average of 195, 14-day average of 201  - Derek Owen has currently uncontrolled symptomatic type 2 DM since 55 years of age.  -Recent labs reviewed.  - I had a long discussion with him about the progressive nature of diabetes and the pathology behind its complications. -his diabetes is complicated by CKD, CVA and he remains at a high risk  for more acute and chronic complications which include CAD, retinopathy, and neuropathy. These are all discussed in detail with him.  - Nutritional counseling repeated at each appointment due to patients tendency to fall back in to old habits.  - The patient admits there is a room for improvement in their diet and drink choices. -  Suggestion is made for the  patient to avoid simple carbohydrates from their diet including Cakes, Sweet Desserts / Pastries, Ice Cream, Soda (diet and regular), Sweet Tea, Candies, Chips, Cookies, Sweet Pastries, Store Bought Juices, Alcohol in Excess of 1-2 drinks a day, Artificial Sweeteners, Coffee Creamer, and "Sugar-free" Products. This will help patient to have stable blood glucose profile and potentially avoid unintended weight gain.   - I encouraged the patient to switch to unprocessed or minimally processed complex starch and increased protein intake (animal or plant source), fruits, and vegetables.   - Patient is advised to stick to a routine mealtimes to eat 3 meals a day and avoid unnecessary snacks (to snack only to correct hypoglycemia).  - he will be scheduled with Jearld Fenton, RDN, CDE for diabetes education.  - I have approached him with the following individualized plan to manage his diabetes and patient agrees:   -He will tolerate increase of his Rybelsus to 14 mg po daily (had only been taking 7 mg) and can continue his Glipizide 10 mg po daily with breakfast.    -he is encouraged to continue monitoring blood glucose twice daily, before breakfast and before bed, and to call the clinic if he has readings less than 70 or greater than 300 for 3 tests in a row.   - Adjustment parameters are given to him for hypo and hyperglycemia in writing.  - he is not a candidate for Metformin as he did not tolerate it in the past due to GI side effects.  - Specific targets for  A1c; LDL, HDL, and Triglycerides were discussed with the patient.  2) Blood  Pressure /Hypertension:  his blood pressure is not controlled to target.   he is advised to continue his current medications including Norvasc 10 mg po daily, and Losartan-HCT 100-25 mg po daily.  3) Lipids/Hyperlipidemia:    Review of his recent lipid panel from 11/20/20 showed uncontrolled LDL at 128 .  he is advised to continue Lipitor 40 mg p.o. daily at bedtime.  Side effects and precautions discussed with him.  4)  Weight/Diet:  his Body mass index is 43.37 kg/m.  -  clearly complicating his diabetes care.   he is a candidate for weight loss. I discussed with him the fact that loss of 5 - 10% of his  current body weight will have the most impact on his diabetes management.  Exercise, and detailed carbohydrates information provided  -  detailed on discharge instructions.  5) Chronic Care/Health Maintenance: -he is on ACEI/ARB and Statin medications and is encouraged to initiate and continue to follow up with Ophthalmology, Dentist, Podiatrist at least yearly or according to recommendations, and advised to [place]. I have recommended yearly flu vaccine and pneumonia vaccine at least every 5 years; moderate intensity exercise for up to 150 minutes weekly; and sleep for at least 7 hours a day.  - he is advised to maintain close follow up with Noreene Larsson, NP for primary care needs, as well as his other providers for optimal and coordinated care.       I spent 43 minutes in the care of the patient today including review of labs from [place], Lipids, Thyroid Function, Hematology (current and previous including abstractions from other facilities); face-to-face time discussing  his blood glucose readings/logs, discussing hypoglycemia and hyperglycemia episodes and symptoms, medications doses, his options of short and long term treatment based on the latest standards of care / guidelines;  discussion about incorporating lifestyle medicine;  and documenting the  encounter.    Please refer to  Patient Instructions for Blood Glucose Monitoring and Insulin/Medications Dosing Guide"  in media tab for additional information. Please  also refer to " Patient Self Inventory" in the Media  tab for reviewed elements of pertinent patient history.  Derek Owen participated in the discussions, expressed understanding, and voiced agreement with the above plans.  All questions were answered to his satisfaction. he is encouraged to contact clinic should he have any questions or concerns prior to his return visit.     Follow up plan: - Return in about 3 months (around 07/27/2021) for Diabetes F/U with A1c in office, No previsit labs, Bring meter and logs.    Rayetta Pigg, [facility] [address] [place], [place] [address] Phone: [phone] Fax: [phone]  04/26/2021, 1:52 PM

## 2021-05-01 NOTE — Progress Notes (Deleted)
Office Visit Note  Patient: Derek Owen             Date of Birth: 29-Sep-1965           MRN: [id number]             PCP: Noreene Larsson, NP Referring: Noreene Larsson, NP Visit Date: 05/03/2021   Subjective:  No chief complaint on file.   History of Present Illness: Derek Owen is a 55 y.o. male here for follow up for seropositive RA on methotrexate 15 mg PO weekly, after stopping prednisone treatment after last visit due to worsening diabetes control significantly. ***   Previous HPI 01/12/21 Derek Owen is a 55 y.o. 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  He feels his symptoms have come and gone with varying severity from day-to-day but overall reasonably controlled and today is a pretty good day.  His blood sugar has been much more elevated than usual lately with PCP and endocrinology visit uncontrolled Hgb A1c 11.3%. 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   11/02/20 Derek Owen is a 55 y.o. male here for seropositive rheumatoid arthritis on methotrexate 15 mg PO weekly and prednisone 10 mg daily. Since his last visit symptoms are partially improved. He still has some left wrist and knee pain but not much swelling. He stopped taking allopurinol due to suspicion being for RA based on lab findings.    10/12/20 Derek Owen is a 55 y.o. male with a history of gout, T2DM, CVA here for evaluation of joint pains with elevated RBC count, CRP, ANA, and RF. His current problems started about 2 months ago with increased pain in the hand, wrist, and knees. He has not improved much with use of colchicine and had less than 1 week improvement with intraarticular steroids. He saw Dr. 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   Labs reviewed 09/2020 RF 66.2 ESR 20 Uric acid 6.1   No Rheumatology ROS completed.   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 Social History   Social History Narrative   Not on file   Immunization History  Administered Date(s) Administered   Tdap 10/08/2020     Objective: Vital Signs: There were no vitals taken for this visit.   Physical Exam   Musculoskeletal Exam: ***  CDAI Exam: CDAI Score: -- Patient Global: --; Provider Global: -- Swollen: --; Tender: -- Joint Exam 05/03/2021   No joint exam has  been documented for this visit   There is currently no information documented on the homunculus. Go to the Rheumatology activity and complete the homunculus joint exam.  Investigation: No additional findings.  Imaging: No results found.  Recent Labs: Lab Results  Component Value Date   WBC 9.3 01/12/2021   HGB 12.3 (L) 01/12/2021   PLT 262 01/12/2021   NA 139 01/12/2021   K 4.0 01/12/2021   CL 100 01/12/2021   CO2 29 01/12/2021   GLUCOSE 143 (H) 01/12/2021   BUN 16 01/12/2021   CREATININE 0.81 01/12/2021   BILITOT 0.6 01/12/2021   ALKPHOS 94 11/20/2020   AST 10 01/12/2021   ALT 13 01/12/2021   PROT 7.0 01/12/2021   ALBUMIN 4.3 11/20/2020   CALCIUM 9.6  01/12/2021   GFRAA 115 01/12/2021    Speciality Comments: No specialty comments available.  Procedures:  No procedures performed Allergies: Patient has no known allergies.   Assessment / Plan:     Visit Diagnoses: No diagnosis found.  ***  Orders: No orders of the defined types were placed in this encounter.  No orders of the defined types were placed in this encounter.    Follow-Up Instructions: No follow-ups on file.   Collier Salina, MD  Note - This record has been created using [employer].  Chart creation errors have been sought, but may not always  have been located. Such creation errors do not reflect on  the standard of medical care.

## 2021-05-03 ENCOUNTER — Other Ambulatory Visit: Payer: Self-pay

## 2021-05-03 ENCOUNTER — Ambulatory Visit: Payer: Managed Care, Other (non HMO) | Admitting: Internal Medicine

## 2021-05-03 DIAGNOSIS — M0580 Other rheumatoid arthritis with rheumatoid factor of unspecified site: Secondary | ICD-10-CM

## 2021-05-03 MED ORDER — METHOTREXATE 2.5 MG PO TABS: 15.0000 mg | ORAL_TABLET | ORAL | 0 refills | Status: DC

## 2021-05-03 MED ORDER — FOLIC ACID 1 MG PO TABS: 1.0000 mg | ORAL_TABLET | Freq: Every day | ORAL | 0 refills | Status: AC

## 2021-05-03 NOTE — Telephone Encounter (Signed)
Next Visit: 05/17/2021  Last Visit: 01/12/2021  Last Fill: 02/14/2021 (MTX), 03/14/8545 (Folic Acid),   DX: Polyarthritis with positive rheumatoid factor   Current Dose per office note 01/12/2021: methotrexate 15 mg p.o. weekly, Recommend him to discontinue the low-dose prednisone that is contributing to uncontrolled hyperglycemia. Folic Acid: TAKE 1 TABLET BY MOUTH EVERY DAY  Labs: 01/12/2021 Blood tests look good no problems from the methotrexate.  Okay to refill MTX and Folic Acid?

## 2021-05-03 NOTE — Telephone Encounter (Signed)
Patient called requesting prescription refills of Methotrexate, Prednisone, and Folic acid to be sent to [facility] [address] in [place].

## 2021-05-07 ENCOUNTER — Other Ambulatory Visit: Payer: Self-pay | Admitting: Internal Medicine

## 2021-05-07 DIAGNOSIS — M0580 Other rheumatoid arthritis with rheumatoid factor of unspecified site: Secondary | ICD-10-CM

## 2021-05-17 ENCOUNTER — Encounter: Payer: Self-pay | Admitting: Internal Medicine

## 2021-05-17 ENCOUNTER — Ambulatory Visit: Payer: Managed Care, Other (non HMO) | Admitting: Internal Medicine

## 2021-05-17 ENCOUNTER — Other Ambulatory Visit: Payer: Self-pay

## 2021-05-17 VITALS — BP 158/73 | HR 108 | Resp 18 | Ht 72.0 in | Wt 313.0 lb

## 2021-05-17 DIAGNOSIS — Z79899 Other long term (current) drug therapy: Secondary | ICD-10-CM

## 2021-05-17 DIAGNOSIS — M0580 Other rheumatoid arthritis with rheumatoid factor of unspecified site: Secondary | ICD-10-CM

## 2021-05-17 MED ORDER — METHOTREXATE 2.5 MG PO TABS: 25.0000 mg | ORAL_TABLET | ORAL | Status: DC

## 2021-05-17 NOTE — Progress Notes (Signed)
Office Visit Note  Patient: [facility]             Date of Birth: [birth year]           MRN: [id number]             PCP: Noreene Larsson, NP Referring: Noreene Larsson, NP Visit Date: 05/17/2021   Subjective:  Follow-up (Having flare left wrist pain, bil knee pain)   History of Present Illness: Derek [facility] is a 55 y.o. 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  His joint pain is doing worse than our last visit.  Pretty much has had increase symptoms ever since stopping the prednisone 5 mg dose mostly involvement of the left wrist left knee and right knee to a lesser extent.  He has not seen very large changes in swelling but does have joint stiffness with some decreased mobility.  His hemoglobin A1c improved a lot with these recent changes down to 8% from previously above 11.  He also sustained a recent fall again on the left side causing increased joint pain.  Previous HPI 01/12/21 [facility] is a 55 y.o. 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  He feels his symptoms have come and gone with varying severity from day-to-day but overall reasonably controlled and today is a pretty good day.  His blood sugar has been much more elevated than usual lately with PCP and endocrinology visit uncontrolled Hgb A1c 11.3%. 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   10/12/20 [facility] is a 55 y.o. male with a history of gout, T2DM, CVA here for evaluation of joint pains with elevated RBC count, CRP, ANA, and RF. His current problems started about 2 months ago with increased pain in the hand, wrist, and knees. He has not improved much with use of colchicine and had less than 1 week improvement with intraarticular steroids. He saw Dr. Amedeo Kinsman with orthopedics and xrays showed severe medial compartment OA and tricompartment  involvement. 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   Labs reviewed 09/2020 RF 66.2 ESR 20 Uric acid 6.1   Review of Systems  Constitutional:  Negative for fatigue.  HENT:  Negative for mouth dryness.   Eyes:  Negative for dryness.  Respiratory:  Negative for shortness of breath.   Cardiovascular:  Negative for swelling in legs/feet.  Gastrointestinal:  Negative for constipation.  Endocrine: Negative for excessive thirst.  Genitourinary:  Negative for difficulty urinating.  Musculoskeletal:  Positive for joint pain, joint pain, joint swelling and morning stiffness.  Skin:  Negative for rash.  Allergic/Immunologic: Negative for susceptible to infections.  Neurological:  Negative for numbness.  Hematological:  Negative for bruising/bleeding tendency.  Psychiatric/Behavioral:  Negative for sleep disturbance.    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 No pertinent surgical history. Social History   Social History Narrative   Not on file   Immunization History  Administered Date(s) Administered   Tdap 10/08/2020     Objective: Vital Signs: BP (!) 158/73 (BP Location: Right Arm, Patient Position: Sitting, Cuff Size: Normal)   Pulse (!) 108   Resp 18   Ht 6' (1.829 m)   Wt  (!) 313 lb (142 kg)   BMI 42.45 kg/m    Physical Exam Constitutional:      Appearance: He is obese.  Cardiovascular:     Rate and Rhythm: Normal rate and regular rhythm.  Pulmonary:     Effort: Pulmonary effort is normal.     Breath sounds: Normal breath sounds.  Skin:    General: Skin is warm and dry.     Findings: No rash.  Neurological:     Mental Status: He is alert.  Psychiatric:        Mood and Affect: Mood normal.     Musculoskeletal Exam:  Shoulders full ROM no tenderness or swelling Elbows full ROM no tenderness or swelling Left wrist tenderness to pressure pain with extension and to lesser extent flexion Fingers full ROM no tenderness or swelling Left knee tenderness with range of motion no palpable effusion or laxity, right knee without significant pain on exam   CDAI Exam: CDAI Score: 10  Patient Global: 50 mm; Provider Global: 30 mm Swollen: 0 ; Tender: 2  Joint Exam 05/17/2021      Right  Left  Wrist      Tender  Knee      Tender      Investigation: No additional findings.  Imaging: No results found.  Recent Labs: Lab Results  Component Value Date   WBC 9.3 01/12/2021   HGB 12.3 (L) 01/12/2021   PLT 262 01/12/2021   NA 139 01/12/2021   K 4.0 01/12/2021   CL 100 01/12/2021   CO2 29 01/12/2021   GLUCOSE 143 (H) 01/12/2021   BUN 16 01/12/2021   CREATININE 0.81 01/12/2021   BILITOT 0.6 01/12/2021   ALKPHOS 94 11/20/2020   AST 10 01/12/2021   ALT 13 01/12/2021   PROT 7.0 01/12/2021   ALBUMIN 4.3 11/20/2020   CALCIUM 9.6 01/12/2021   GFRAA 115 01/12/2021    Speciality Comments: No specialty comments available.  Procedures:  No procedures performed Allergies: Patient has no known allergies.   Assessment / Plan:     Visit Diagnoses: Polyarthritis with positive rheumatoid factor ([place]) - Plan: methotrexate (RHEUMATREX) 2.5 MG tablet  Some increased joint pain with discontinuing the prednisone dose so methotrexate monotherapy may not  be completely controlling arthritis symptoms.  However also has osteoarthritis in these areas could be a major contributor.  Discussed increase or alternate treatments such as injectable biologic DMARD he is strongly preferring oral medication.  Recommend a plan for increasing methotrexate stepwise to 20 mg and then 25 mg p.o. weekly.  We will need him to return lab visit in a month with the increase in dose.  High risk medication use - Plan: CBC with Differential/Platelet, COMPLETE METABOLIC PANEL WITH GFR  Orders for future CBC and CMP placed anticipating checking after methotrexate dose increased during the next month.  Orders: Orders Placed This Encounter  Procedures   CBC with Differential/Platelet   COMPLETE METABOLIC PANEL WITH GFR    Meds ordered this encounter  Medications   methotrexate (RHEUMATREX) 2.5 MG tablet    Sig: Take 10 tablets (25 mg total) by mouth once a week. Caution:Chemotherapy. Protect from light.      Follow-Up Instructions: Return in about 3 months (around 08/17/2021) for RA on MTX increase f/u 43mo.   CCollier Salina MD  Note - This record has been created using [employer]  Chart creation errors have been sought, but may not always  have been located. Such creation errors do not reflect on  the standard of medical care.

## 2021-05-21 ENCOUNTER — Telehealth: Payer: Self-pay

## 2021-05-21 NOTE — Telephone Encounter (Signed)
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 Put in envelope at front desk. He will come by to pick up.

## 2021-05-25 NOTE — Telephone Encounter (Signed)
Printed and put up front

## 2021-06-17 ENCOUNTER — Telehealth: Payer: Self-pay

## 2021-06-17 ENCOUNTER — Other Ambulatory Visit: Payer: Self-pay

## 2021-06-17 DIAGNOSIS — E1165 Type 2 diabetes mellitus with hyperglycemia: Secondary | ICD-10-CM

## 2021-06-17 DIAGNOSIS — I10 Essential (primary) hypertension: Secondary | ICD-10-CM

## 2021-06-17 MED ORDER — AMLODIPINE BESYLATE 10 MG PO TABS: 10.0000 mg | ORAL_TABLET | Freq: Every day | ORAL | 1 refills | Status: AC

## 2021-06-17 MED ORDER — GLIPIZIDE 10 MG PO TABS
10.0000 mg | ORAL_TABLET | Freq: Every day | ORAL | 1 refills | Status: AC
Start: 2021-06-17 — End: ?

## 2021-06-17 MED ORDER — LOSARTAN POTASSIUM-HCTZ 100-25 MG PO TABS: 1.0000 | ORAL_TABLET | Freq: Every day | ORAL | 1 refills | Status: DC

## 2021-06-17 MED ORDER — ATORVASTATIN CALCIUM 40 MG PO TABS
40.0000 mg | ORAL_TABLET | Freq: Every day | ORAL | 1 refills | Status: AC
Start: 2021-06-17 — End: ?

## 2021-06-17 NOTE — Telephone Encounter (Signed)
All meds refilled from [facility]

## 2021-06-17 NOTE — Telephone Encounter (Signed)
Pt needs refill on all meds since Maricela is leaving at the end on the month as he also needs to ask [place] if Posey Pronto or Kristin Bruins can be his PCP.  Please call him back.

## 2021-06-18 NOTE — Telephone Encounter (Signed)
Kristin Bruins will take this patient

## 2021-06-18 NOTE — Telephone Encounter (Signed)
Will you accept this patient from [facility]?

## 2021-07-12 ENCOUNTER — Other Ambulatory Visit: Payer: Self-pay

## 2021-07-12 ENCOUNTER — Emergency Department (HOSPITAL_COMMUNITY)
Admission: EM | Admit: 2021-07-12 | Discharge: 2021-07-12 | Disposition: A | Discharge status: Home / Self Care | Payer: Managed Care, Other (non HMO) | Attending: Emergency Medicine | Admitting: Emergency Medicine

## 2021-07-12 ENCOUNTER — Emergency Department (HOSPITAL_COMMUNITY): Payer: Managed Care, Other (non HMO)

## 2021-07-12 DIAGNOSIS — R0602 Shortness of breath: Secondary | ICD-10-CM | POA: Diagnosis not present

## 2021-07-12 DIAGNOSIS — Z79899 Other long term (current) drug therapy: Secondary | ICD-10-CM | POA: Insufficient documentation

## 2021-07-12 DIAGNOSIS — E119 Type 2 diabetes mellitus without complications: Secondary | ICD-10-CM | POA: Insufficient documentation

## 2021-07-12 DIAGNOSIS — U071 COVID-19: Secondary | ICD-10-CM

## 2021-07-12 DIAGNOSIS — I10 Essential (primary) hypertension: Secondary | ICD-10-CM | POA: Diagnosis not present

## 2021-07-12 DIAGNOSIS — Z7982 Long term (current) use of aspirin: Secondary | ICD-10-CM | POA: Diagnosis not present

## 2021-07-12 LAB — CBC WITH DIFFERENTIAL/PLATELET
Abs Immature Granulocytes: 0.13 10*3/uL — ABNORMAL HIGH (ref 0.00–0.07)
Basophils Absolute: 0.1 10*3/uL (ref 0.0–0.1)
Basophils Relative: 1 %
Eosinophils Absolute: 0.1 10*3/uL (ref 0.0–0.5)
Eosinophils Relative: 1 %
HCT: 36.1 % — ABNORMAL LOW (ref 39.0–52.0)
Hemoglobin: 11.4 g/dL — ABNORMAL LOW (ref 13.0–17.0)
Immature Granulocytes: 2 %
Lymphocytes Relative: 12 %
Lymphs Abs: 1 10*3/uL (ref 0.7–4.0)
MCH: 23.8 pg — ABNORMAL LOW (ref 26.0–34.0)
MCHC: 31.6 g/dL (ref 30.0–36.0)
MCV: 75.2 fL — ABNORMAL LOW (ref 80.0–100.0)
Monocytes Absolute: 0.8 10*3/uL (ref 0.1–1.0)
Monocytes Relative: 10 %
Neutro Abs: 6.2 10*3/uL (ref 1.7–7.7)
Neutrophils Relative %: 74 %
Platelets: 240 10*3/uL (ref 150–400)
RBC: 4.8 MIL/uL (ref 4.22–5.81)
RDW: 18.9 % — ABNORMAL HIGH (ref 11.5–15.5)
WBC: 8.2 10*3/uL (ref 4.0–10.5)
nRBC: 0 % (ref 0.0–0.2)

## 2021-07-12 LAB — COMPREHENSIVE METABOLIC PANEL
ALT: 19 U/L (ref 0–44)
AST: 19 U/L (ref 15–41)
Albumin: 3.6 g/dL (ref 3.5–5.0)
Alkaline Phosphatase: 75 U/L (ref 38–126)
Anion gap: 13 (ref 5–15)
BUN: 22 mg/dL — ABNORMAL HIGH (ref 6–20)
CO2: 25 mmol/L (ref 22–32)
Calcium: 8.7 mg/dL — ABNORMAL LOW (ref 8.9–10.3)
Chloride: 101 mmol/L (ref 98–111)
Creatinine, Ser: 0.85 mg/dL (ref 0.61–1.24)
GFR, Estimated: >60 mL/min (ref >60)
Glucose, Bld: 183 mg/dL — ABNORMAL HIGH (ref 70–99)
Potassium: 3.7 mmol/L (ref 3.5–5.1)
Sodium: 139 mmol/L (ref 135–145)
Total Bilirubin: 0.4 mg/dL (ref 0.3–1.2)
Total Protein: 6.9 g/dL (ref 6.5–8.1)

## 2021-07-12 LAB — TROPONIN I (HIGH SENSITIVITY)
Troponin I (High Sensitivity): 10 ng/L (ref <18)
Troponin I (High Sensitivity): 8 ng/L (ref <18)

## 2021-07-12 LAB — BRAIN NATRIURETIC PEPTIDE: B Natriuretic Peptide: 12 pg/mL (ref 0.0–100.0)

## 2021-07-12 LAB — D-DIMER, QUANTITATIVE: D-Dimer, Quant: 1.43 ug/mL-FEU — ABNORMAL HIGH (ref 0.00–0.50)

## 2021-07-12 MED ORDER — HEPARIN BOLUS VIA INFUSION
5500.0000 [IU] | Freq: Once | INTRAVENOUS | Status: AC
Start: 2021-07-12 — End: 2021-07-12
  Administered 2021-07-12: 5500 [IU] via INTRAVENOUS

## 2021-07-12 MED ORDER — TECHNETIUM TO 99M ALBUMIN AGGREGATED
4.4000 | Freq: Once | INTRAVENOUS | Status: AC | PRN
  Administered 2021-07-12: 4.4 via INTRAVENOUS

## 2021-07-12 MED ORDER — AEROCHAMBER PLUS FLO-VU MISC
1.0000 | Freq: Once | Status: AC
Start: 2021-07-12 — End: 2021-07-12
  Administered 2021-07-12: 1
  Filled 2021-07-12: qty 1

## 2021-07-12 MED ORDER — HEPARIN (PORCINE) 25000 UT/250ML-% IV SOLN
1800.0000 [IU]/h | INTRAVENOUS | Status: DC
  Administered 2021-07-12: 1800 [IU]/h via INTRAVENOUS
  Filled 2021-07-12: qty 250

## 2021-07-12 MED ORDER — ALBUTEROL SULFATE HFA 108 (90 BASE) MCG/ACT IN AERS
6.0000 | INHALATION_SPRAY | Freq: Once | RESPIRATORY_TRACT | Status: AC
  Administered 2021-07-12: 6 via RESPIRATORY_TRACT
  Filled 2021-07-12: qty 6.7

## 2021-07-12 MED ORDER — GUAIFENESIN 100 MG/5ML PO LIQD: 100.0000 mg | ORAL | 0 refills | Status: DC | PRN

## 2021-07-12 MED ORDER — DOXYCYCLINE HYCLATE 100 MG PO TABS
100.0000 mg | ORAL_TABLET | Freq: Once | ORAL | Status: AC
Start: 2021-07-12 — End: 2021-07-12
  Administered 2021-07-12: 100 mg via ORAL
  Filled 2021-07-12: qty 1

## 2021-07-12 MED ORDER — IBUPROFEN 600 MG PO TABS: 600.0000 mg | ORAL_TABLET | Freq: Four times a day (QID) | ORAL | 0 refills | Status: DC | PRN

## 2021-07-12 MED ORDER — PREDNISONE 50 MG PO TABS
60.0000 mg | ORAL_TABLET | Freq: Once | ORAL | Status: AC
  Administered 2021-07-12: 60 mg via ORAL
  Filled 2021-07-12: qty 1

## 2021-07-12 MED ORDER — ALBUTEROL SULFATE HFA 108 (90 BASE) MCG/ACT IN AERS: 1.0000 | INHALATION_SPRAY | RESPIRATORY_TRACT | 0 refills | Status: AC | PRN

## 2021-07-12 MED ORDER — IPRATROPIUM-ALBUTEROL 0.5-2.5 (3) MG/3ML IN SOLN
3.0000 mL | RESPIRATORY_TRACT | Status: AC
  Administered 2021-07-12 (×3): 3 mL via RESPIRATORY_TRACT
  Filled 2021-07-12: qty 9

## 2021-07-12 MED ORDER — ALBUTEROL SULFATE HFA 108 (90 BASE) MCG/ACT IN AERS
4.0000 | INHALATION_SPRAY | Freq: Once | RESPIRATORY_TRACT | Status: AC
  Administered 2021-07-12: 4 via RESPIRATORY_TRACT

## 2021-07-12 NOTE — ED Provider Notes (Signed)
7:11 AM Patient signed out to me by previous ED physician. 56 yo male presenting for sob. LE swelling with pitting edema. Tachycardic, wheezing, crackles. Recent hx of covid. No hypoxia. Tachycardia and tachypnea upon ambulating. D-dimer positive.   Waiting for: CT PE pending. CT scanner down currently. Heparin started while waiting.   Plan: Admit if positive. DC if negative.       Physical Exam  BP 139/79    Pulse (!) 117    Temp 98.7 F (37.1 C) (Oral)    Resp 18    Wt (!) 142.4 kg    SpO2 98%    BMI 42.59 kg/m   Physical Exam Vitals and nursing note reviewed.  Constitutional:      Appearance: He is well-developed.  HENT:     Head: Normocephalic and atraumatic.  Cardiovascular:     Rate and Rhythm: Regular rhythm. Tachycardia present.  Pulmonary:     Effort: Pulmonary effort is normal.     Breath sounds: Normal breath sounds.  Skin:    Capillary Refill: Capillary refill takes less than 2 seconds.  Neurological:     Mental Status: He is alert and oriented to person, place, and time.    ED Course/Procedures   Clinical Course as of 07/12/21 0711  Mon Jul 12, 2021  0554 Hennie Duos, [place] 07/12/2021 05:39  Ambulated pt around department Pt stayed 94%o2 RA and heart rate in the 130s. Pt sounded winded and some wheezing while walking.     [JM]  0349 Apparently patient quite dyspneic and tachycardic on ambulation. Not hypoxic. Doesn't seem like the breathign treatment/antibiotic/steroid helped much. Will add on second troponin and d dimer.  [JM]    Clinical Course User Index [JM] Mesner, Corene Cornea, MD    Procedures  MDM   CT scanner will be down for next 24 hours. VQ scan completed and normal. Will stop heparin. Pt remains Aox3, no acute distress, with improved tachycardia, and otherwise stable vitals. 97% oxygen on room air. Will plan for DC with rest, increased hydration, albuterol, Robitussin, motrin/tylenol for pain or fevers, and return for worsens shortness of  breath. Patient's symptoms started 7 days ago. Outside recommended time window for Paxlovid. No hypoxia-not recommended for dexamethasone.   Patient in no distress and overall condition improved here in the ED. Detailed discussions were had with the patient regarding current findings, and need for close f/u with PCP or on call doctor. The patient has been instructed to return immediately if the symptoms worsen in any way for re-evaluation. Patient verbalized understanding and is in agreement with current care plan. All questions answered prior to discharge.        Campbell Stall P, DO 17/91/50 1307

## 2021-07-12 NOTE — Progress Notes (Addendum)
ANTICOAGULATION CONSULT NOTE - Initial Consult  Pharmacy Consult for heparin Indication: pulmonary embolus  No Known Allergies  Patient Measurements: Height: 6' (182.9 cm) Weight: (!) 142.4 kg (314 lb) IBW/kg (Calculated) : 77.6 Heparin Dosing Weight: 110 kg  Vital Signs: Temp: 98.7 F (37.1 C) (01/02 0202) Temp Source: Oral (01/02 0202) BP: 139/79 (01/02 0600) Pulse Rate: 117 (01/02 0600)  Labs: Recent Labs    07/12/21 0232 07/12/21 0610  HGB 11.4*  --   HCT 36.1*  --   PLT 240  --   CREATININE 0.85  --   TROPONINIHS 10 8    Estimated Creatinine Clearance: 142.1 mL/min (by C-G formula based on SCr of 0.85 mg/dL).   Medical History: Past Medical History:  Diagnosis Date   Alcohol use disorder, mild, abuse    Cigarette smoker    Diabetes mellitus without complication ([place])    Hyperlipidemia    Hypertension    Polycythemia 04/13/2020   Stroke ([place])    x3    Medications:  (Not in a hospital admission)   Assessment: Pharmacy consulted to dose heparin in patient with rule out of pulmonary embolus.  Patient is not on anticoagulation prior to admission.   D-Dimer 1.42 CBC WNL  Goal of Therapy:  Heparin level 0.3-0.7 Monitor platelets by anticoagulation protocol: Yes    Plan:  Give 5500 units bolus x 1 Start heparin infusion at 1800 units/hr Check anti-Xa level in 6 hours and daily while on heparin Continue to monitor H&H and platelets  Margot Ables, PharmD Clinical Pharmacist 07/12/2021 7:47 AM

## 2021-07-12 NOTE — ED Notes (Signed)
Ambulated pt around department Pt stayed 94%o2 RA and heart rate in the 130s. Pt sounded winded and some wheezing while walking.

## 2021-07-12 NOTE — ED Triage Notes (Signed)
Pt arrives with c/o exertional SOB 3-4 days. Per pt, he tested positive for COVID on 12/23. Pt denies CP.

## 2021-07-12 NOTE — ED Provider Notes (Signed)
[facility] Provider Note   CSN: [id number] Arrival date & time: 07/12/21  0150     History  Chief Complaint  Patient presents with   Shortness of Breath    De[facility] Owen is a 56 y.o. male.  56 year old male with history of hypertension, hyperlipidemia, diabetes, polycythemia and distant history of stroke who is a current smoker the presents emerged part today with progressive shortness of breath.  Patient states he was diagnosed with COVID back before Christmas.  He states that since that time he had dyspnea on exertion.  States at rest he feels fine.  If he lays flat he does not get significantly short of breath or coughing.  It is when he gets up and walks a distance he starts getting short of breath.  No chest pain associated with it.  He has some lower extremity swelling but this is not new for him.  No fevers.  Does have a cough but not productive.   Shortness of Breath     Home Medications Prior to Admission medications   Medication Sig Start Date End Date Taking? Authorizing Provider  amLODipine (NORVASC) 10 MG tablet Take 1 tablet (10 mg total) by mouth daily. 06/17/21   Fayrene Helper, MD  aspirin 81 MG chewable tablet Chew by mouth daily.    [provider]  atorvastatin (LIPITOR) 40 MG tablet Take 1 tablet (40 mg total) by mouth daily. 06/17/21   Fayrene Helper, MD  Ciclopirox 0.77 % gel SMARTSIG:1 Topical Daily PRN 05/14/21   [provider]  folic acid (FOLVITE) 1 MG tablet Take 1 tablet (1 mg total) by mouth daily. 05/03/21   Rice, Resa Miner, MD  glipiZIDE (GLUCOTROL) 10 MG tablet Take 1 tablet (10 mg total) by mouth daily before breakfast. 06/17/21   Fayrene Helper, MD  ibuprofen (ADVIL) 800 MG tablet Take 1 tablet (800 mg total) by mouth every 8 (eight) hours as needed. Patient not taking: Reported on 05/17/2021 10/08/20   Noreene Larsson, NP  losartan-hydrochlorothiazide (HYZAAR) 100-25 MG tablet Take 1 tablet by  mouth daily. 06/17/21   Fayrene Helper, MD  methotrexate (RHEUMATREX) 2.5 MG tablet Take 10 tablets (25 mg total) by mouth once a week. Caution:Chemotherapy. Protect from light. 05/17/21   Rice, Resa Miner, MD  Semaglutide (RYBELSUS) 14 MG TABS Take 14 mg by mouth daily. 04/26/21   Brita Romp, NP      Allergies    Patient has no known allergies.    Review of Systems   Review of Systems  Respiratory:  Positive for shortness of breath.    Physical Exam Updated Vital Signs BP (!) 144/84    Pulse (!) 114    Temp 98.7 F (37.1 C) (Oral)    Resp 20    Wt (!) 142.4 kg    SpO2 94%    BMI 42.59 kg/m  Physical Exam Vitals and nursing note reviewed.  Constitutional:      Appearance: He is well-developed.  HENT:     Head: Normocephalic and atraumatic.  Cardiovascular:     Rate and Rhythm: Normal rate.  Pulmonary:     Effort: Pulmonary effort is normal. No respiratory distress.     Breath sounds: Wheezing present. No decreased breath sounds or rales.  Abdominal:     General: There is no distension.  Musculoskeletal:        General: Normal range of motion.     Cervical back: Normal range  of motion.     Right lower leg: Edema present.     Left lower leg: Edema present.  Skin:    General: Skin is warm and dry.  Neurological:     General: No focal deficit present.     Mental Status: He is alert.    ED Results / Procedures / Treatments   Labs (all labs ordered are listed, but only abnormal results are displayed) Labs Reviewed  CBC WITH DIFFERENTIAL/PLATELET - Abnormal; Notable for the following components:      Result Value   Hemoglobin 11.4 (*)    HCT 36.1 (*)    MCV 75.2 (*)    MCH 23.8 (*)    RDW 18.9 (*)    Abs Immature Granulocytes 0.13 (*)    All other components within normal limits  COMPREHENSIVE METABOLIC PANEL - Abnormal; Notable for the following components:   Glucose, Bld 183 (*)    BUN 22 (*)    Calcium 8.7 (*)    All other components within normal  limits  BRAIN NATRIURETIC PEPTIDE  D-DIMER, QUANTITATIVE  TROPONIN I (HIGH SENSITIVITY)  TROPONIN I (HIGH SENSITIVITY)    EKG None  Radiology DG Chest Portable 1 View  Result Date: 07/12/2021 CLINICAL DATA:  Exertional shortness of breath and cough. Tested positive for COVID 8 days ago. EXAM: PORTABLE CHEST 1 VIEW COMPARISON:  There are prior chest films from 2018 and 2014 but they could not be retrieved from PACS. FINDINGS: There is mild cardiomegaly. There is slight central vascular prominence. No pulmonary edema is seen. There is a moderate left pleural effusion with adjacent consolidation or atelectasis in the left lower lung field. Findings may suggest a left lower lobe pneumonia with parapneumonic pleural effusion. The remaining lungs are clear. The right sulci are sharp. Thoracic spondylosis. IMPRESSION: Left pleural effusion with adjacent consolidation or atelectasis in the left lower lung field. Clinical correlation and radiographic follow-up recommended. Mild cardiomegaly with slightly prominent central vessels. Electronically Signed   By: Telford Nab M.D.   On: 07/12/2021 02:53    Procedures Procedures    Medications Ordered in ED Medications  ipratropium-albuterol (DUONEB) 0.5-2.5 (3) MG/3ML nebulizer solution 3 mL (has no administration in time range)  albuterol (VENTOLIN HFA) 108 (90 Base) MCG/ACT inhaler 6 puff (6 puffs Inhalation Given 07/12/21 0253)  aerochamber plus with mask device 1 each (1 each Other Given 07/12/21 0253)  doxycycline (VIBRA-TABS) tablet 100 mg (100 mg Oral Given 07/12/21 0436)  predniSONE (DELTASONE) tablet 60 mg (60 mg Oral Given 07/12/21 0436)  albuterol (VENTOLIN HFA) 108 (90 Base) MCG/ACT inhaler 4 puff (4 puffs Inhalation Given 07/12/21 0437)    ED Course/ Medical Decision Making/ A&P Clinical Course as of 07/12/21 0738  Mon Jul 12, 2021  0554 Hennie Duos, [place] 07/12/2021 05:39  Ambulated pt around department Pt stayed 94%o2 RA and heart rate in  the 130s. Pt sounded winded and some wheezing while walking.     [JM]  2409 Apparently patient quite dyspneic and tachycardic on ambulation. Not hypoxic. Doesn't seem like the breathign treatment/antibiotic/steroid helped much. Will add on second troponin and d dimer.  [JM]    Clinical Course User Index [JM] Deny Chevez, Corene Cornea, MD                           Medical Decision Making  Initial differential diagnosis is a post COVID-pneumonia, bronchitis, acute heart failure exacerbation, ACS versus deconditioning secondary to his recent  illness.  He he does smoke up make an bronchitis more likely.  D dimer elevated, will need PE rule out. Ct scan at this facility is non-operable, will start heparin and order a V/Q scan. If this is negative and patient still symptoamtic may need cardiac workup.    Care transferred to Dr. Shawna Orleans pending completion of workup.     Final Clinical Impression(s) / ED Diagnoses Final diagnoses:  None    Rx / DC Orders ED Discharge Orders          Ordered    Check Pulse Oximetry while ambulating        07/12/21 0402              Sheilyn Boehlke, Corene Cornea, MD 07/12/21 [phone]

## 2021-07-18 ENCOUNTER — Encounter (HOSPITAL_COMMUNITY): Payer: Self-pay

## 2021-07-18 ENCOUNTER — Emergency Department (HOSPITAL_COMMUNITY): Payer: Managed Care, Other (non HMO)

## 2021-07-18 ENCOUNTER — Inpatient Hospital Stay (HOSPITAL_COMMUNITY)
Admission: EM | Admit: 2021-07-18 | Discharge: 2021-07-30 | DRG: 166 | Disposition: A | Discharge status: Home / Self Care | Payer: Managed Care, Other (non HMO) | Attending: Internal Medicine | Admitting: Internal Medicine

## 2021-07-18 DIAGNOSIS — I152 Hypertension secondary to endocrine disorders: Secondary | ICD-10-CM | POA: Diagnosis present

## 2021-07-18 DIAGNOSIS — C642 Malignant neoplasm of left kidney, except renal pelvis: Secondary | ICD-10-CM | POA: Diagnosis present

## 2021-07-18 DIAGNOSIS — E1169 Type 2 diabetes mellitus with other specified complication: Secondary | ICD-10-CM | POA: Diagnosis present

## 2021-07-18 DIAGNOSIS — D62 Acute posthemorrhagic anemia: Secondary | ICD-10-CM | POA: Diagnosis present

## 2021-07-18 DIAGNOSIS — J9601 Acute respiratory failure with hypoxia: Secondary | ICD-10-CM | POA: Diagnosis present

## 2021-07-18 DIAGNOSIS — Z7984 Long term (current) use of oral hypoglycemic drugs: Secondary | ICD-10-CM

## 2021-07-18 DIAGNOSIS — N179 Acute kidney failure, unspecified: Secondary | ICD-10-CM | POA: Diagnosis present

## 2021-07-18 DIAGNOSIS — F17210 Nicotine dependence, cigarettes, uncomplicated: Secondary | ICD-10-CM | POA: Diagnosis present

## 2021-07-18 DIAGNOSIS — Z79631 Long term (current) use of antimetabolite agent: Secondary | ICD-10-CM

## 2021-07-18 DIAGNOSIS — J189 Pneumonia, unspecified organism: Secondary | ICD-10-CM | POA: Diagnosis present

## 2021-07-18 DIAGNOSIS — Z8673 Personal history of transient ischemic attack (TIA), and cerebral infarction without residual deficits: Secondary | ICD-10-CM

## 2021-07-18 DIAGNOSIS — Z9114 Patient's other noncompliance with medication regimen: Secondary | ICD-10-CM

## 2021-07-18 DIAGNOSIS — F1010 Alcohol abuse, uncomplicated: Secondary | ICD-10-CM | POA: Diagnosis present

## 2021-07-18 DIAGNOSIS — E871 Hypo-osmolality and hyponatremia: Secondary | ICD-10-CM | POA: Diagnosis present

## 2021-07-18 DIAGNOSIS — Z79899 Other long term (current) drug therapy: Secondary | ICD-10-CM

## 2021-07-18 DIAGNOSIS — Z6841 Body Mass Index (BMI) 40.0 and over, adult: Secondary | ICD-10-CM

## 2021-07-18 DIAGNOSIS — R1900 Intra-abdominal and pelvic swelling, mass and lump, unspecified site: Secondary | ICD-10-CM | POA: Diagnosis present

## 2021-07-18 DIAGNOSIS — J869 Pyothorax without fistula: Secondary | ICD-10-CM | POA: Diagnosis present

## 2021-07-18 DIAGNOSIS — Z7982 Long term (current) use of aspirin: Secondary | ICD-10-CM

## 2021-07-18 DIAGNOSIS — J441 Chronic obstructive pulmonary disease with (acute) exacerbation: Secondary | ICD-10-CM

## 2021-07-18 DIAGNOSIS — R1902 Left upper quadrant abdominal swelling, mass and lump: Secondary | ICD-10-CM

## 2021-07-18 DIAGNOSIS — Z9889 Other specified postprocedural states: Secondary | ICD-10-CM

## 2021-07-18 DIAGNOSIS — E1159 Type 2 diabetes mellitus with other circulatory complications: Secondary | ICD-10-CM | POA: Diagnosis present

## 2021-07-18 DIAGNOSIS — D508 Other iron deficiency anemias: Secondary | ICD-10-CM | POA: Diagnosis present

## 2021-07-18 DIAGNOSIS — E119 Type 2 diabetes mellitus without complications: Secondary | ICD-10-CM

## 2021-07-18 DIAGNOSIS — R069 Unspecified abnormalities of breathing: Secondary | ICD-10-CM

## 2021-07-18 DIAGNOSIS — R0602 Shortness of breath: Secondary | ICD-10-CM | POA: Diagnosis not present

## 2021-07-18 DIAGNOSIS — J918 Pleural effusion in other conditions classified elsewhere: Secondary | ICD-10-CM | POA: Diagnosis present

## 2021-07-18 DIAGNOSIS — E876 Hypokalemia: Secondary | ICD-10-CM | POA: Diagnosis present

## 2021-07-18 DIAGNOSIS — Z09 Encounter for follow-up examination after completed treatment for conditions other than malignant neoplasm: Secondary | ICD-10-CM

## 2021-07-18 DIAGNOSIS — J90 Pleural effusion, not elsewhere classified: Secondary | ICD-10-CM | POA: Diagnosis present

## 2021-07-18 DIAGNOSIS — Z9689 Presence of other specified functional implants: Secondary | ICD-10-CM

## 2021-07-18 DIAGNOSIS — Z8616 Personal history of COVID-19: Secondary | ICD-10-CM

## 2021-07-18 DIAGNOSIS — E1165 Type 2 diabetes mellitus with hyperglycemia: Secondary | ICD-10-CM | POA: Diagnosis present

## 2021-07-18 DIAGNOSIS — R9389 Abnormal findings on diagnostic imaging of other specified body structures: Secondary | ICD-10-CM

## 2021-07-18 DIAGNOSIS — M069 Rheumatoid arthritis, unspecified: Secondary | ICD-10-CM | POA: Diagnosis present

## 2021-07-18 DIAGNOSIS — C782 Secondary malignant neoplasm of pleura: Principal | ICD-10-CM | POA: Diagnosis present

## 2021-07-18 DIAGNOSIS — R0603 Acute respiratory distress: Secondary | ICD-10-CM

## 2021-07-18 DIAGNOSIS — Z91119 Patient's noncompliance with dietary regimen due to unspecified reason: Secondary | ICD-10-CM

## 2021-07-18 DIAGNOSIS — D509 Iron deficiency anemia, unspecified: Secondary | ICD-10-CM | POA: Diagnosis present

## 2021-07-18 DIAGNOSIS — R5381 Other malaise: Secondary | ICD-10-CM | POA: Diagnosis present

## 2021-07-18 DIAGNOSIS — I10 Essential (primary) hypertension: Secondary | ICD-10-CM | POA: Diagnosis present

## 2021-07-18 DIAGNOSIS — D849 Immunodeficiency, unspecified: Secondary | ICD-10-CM | POA: Diagnosis present

## 2021-07-18 DIAGNOSIS — J9600 Acute respiratory failure, unspecified whether with hypoxia or hypercapnia: Secondary | ICD-10-CM

## 2021-07-18 DIAGNOSIS — E785 Hyperlipidemia, unspecified: Secondary | ICD-10-CM | POA: Diagnosis present

## 2021-07-18 LAB — I-STAT ARTERIAL BLOOD GAS, ED
Acid-Base Excess: 6 mmol/L — ABNORMAL HIGH (ref 0.0–2.0)
Bicarbonate: 31 mmol/L — ABNORMAL HIGH (ref 20.0–28.0)
Calcium, Ion: 1.15 mmol/L (ref 1.15–1.40)
HCT: 33 % — ABNORMAL LOW (ref 39.0–52.0)
Hemoglobin: 11.2 g/dL — ABNORMAL LOW (ref 13.0–17.0)
O2 Saturation: 99 %
Patient temperature: 98.3
Potassium: 3.3 mmol/L — ABNORMAL LOW (ref 3.5–5.1)
Sodium: 133 mmol/L — ABNORMAL LOW (ref 135–145)
TCO2: 32 mmol/L (ref 22–32)
pCO2 arterial: 44.4 mmHg (ref 32.0–48.0)
pH, Arterial: 7.451 — ABNORMAL HIGH (ref 7.350–7.450)
pO2, Arterial: 116 mmHg — ABNORMAL HIGH (ref 83.0–108.0)

## 2021-07-18 LAB — COMPREHENSIVE METABOLIC PANEL
ALT: 18 U/L (ref 0–44)
AST: 22 U/L (ref 15–41)
Albumin: 3.4 g/dL — ABNORMAL LOW (ref 3.5–5.0)
Alkaline Phosphatase: 78 U/L (ref 38–126)
Anion gap: 17 — ABNORMAL HIGH (ref 5–15)
BUN: 13 mg/dL (ref 6–20)
CO2: 26 mmol/L (ref 22–32)
Calcium: 9 mg/dL (ref 8.9–10.3)
Chloride: 90 mmol/L — ABNORMAL LOW (ref 98–111)
Creatinine, Ser: 1.04 mg/dL (ref 0.61–1.24)
GFR, Estimated: >60 mL/min (ref >60)
Glucose, Bld: 255 mg/dL — ABNORMAL HIGH (ref 70–99)
Potassium: 3.3 mmol/L — ABNORMAL LOW (ref 3.5–5.1)
Sodium: 133 mmol/L — ABNORMAL LOW (ref 135–145)
Total Bilirubin: 1.1 mg/dL (ref 0.3–1.2)
Total Protein: 6.7 g/dL (ref 6.5–8.1)

## 2021-07-18 LAB — CBC WITH DIFFERENTIAL/PLATELET
Abs Immature Granulocytes: 0.07 10*3/uL (ref 0.00–0.07)
Basophils Absolute: 0.1 10*3/uL (ref 0.0–0.1)
Basophils Relative: 1 %
Eosinophils Absolute: 0 10*3/uL (ref 0.0–0.5)
Eosinophils Relative: 0 %
HCT: 34.6 % — ABNORMAL LOW (ref 39.0–52.0)
Hemoglobin: 10.8 g/dL — ABNORMAL LOW (ref 13.0–17.0)
Immature Granulocytes: 1 %
Lymphocytes Relative: 8 %
Lymphs Abs: 1 10*3/uL (ref 0.7–4.0)
MCH: 23.6 pg — ABNORMAL LOW (ref 26.0–34.0)
MCHC: 31.2 g/dL (ref 30.0–36.0)
MCV: 75.7 fL — ABNORMAL LOW (ref 80.0–100.0)
Monocytes Absolute: 1.3 10*3/uL — ABNORMAL HIGH (ref 0.1–1.0)
Monocytes Relative: 10 %
Neutro Abs: 10.2 10*3/uL — ABNORMAL HIGH (ref 1.7–7.7)
Neutrophils Relative %: 80 %
Platelets: 203 10*3/uL (ref 150–400)
RBC: 4.57 MIL/uL (ref 4.22–5.81)
RDW: 18.5 % — ABNORMAL HIGH (ref 11.5–15.5)
WBC: 12.6 10*3/uL — ABNORMAL HIGH (ref 4.0–10.5)
nRBC: 0 % (ref 0.0–0.2)

## 2021-07-18 LAB — I-STAT CHEM 8, ED
BUN: 15 mg/dL (ref 6–20)
Calcium, Ion: 1.08 mmol/L — ABNORMAL LOW (ref 1.15–1.40)
Chloride: 93 mmol/L — ABNORMAL LOW (ref 98–111)
Creatinine, Ser: 0.9 mg/dL (ref 0.61–1.24)
Glucose, Bld: 252 mg/dL — ABNORMAL HIGH (ref 70–99)
HCT: 38 % — ABNORMAL LOW (ref 39.0–52.0)
Hemoglobin: 12.9 g/dL — ABNORMAL LOW (ref 13.0–17.0)
Potassium: 3.3 mmol/L — ABNORMAL LOW (ref 3.5–5.1)
Sodium: 134 mmol/L — ABNORMAL LOW (ref 135–145)
TCO2: 28 mmol/L (ref 22–32)

## 2021-07-18 LAB — TROPONIN I (HIGH SENSITIVITY): Troponin I (High Sensitivity): 17 ng/L (ref <18)

## 2021-07-18 LAB — RESP PANEL BY RT-PCR (FLU A&B, COVID) ARPGX2
Influenza A by PCR: NEGATIVE
Influenza B by PCR: NEGATIVE
SARS Coronavirus 2 by RT PCR: NEGATIVE

## 2021-07-18 LAB — BRAIN NATRIURETIC PEPTIDE: B Natriuretic Peptide: 67.7 pg/mL (ref 0.0–100.0)

## 2021-07-18 MED ORDER — IPRATROPIUM BROMIDE 0.02 % IN SOLN
1.0000 mg | Freq: Once | RESPIRATORY_TRACT | Status: AC
Start: 2021-07-18 — End: 2021-07-18
  Administered 2021-07-18: 1 mg via RESPIRATORY_TRACT
  Filled 2021-07-18: qty 5

## 2021-07-18 MED ORDER — DEXAMETHASONE SODIUM PHOSPHATE 10 MG/ML IJ SOLN
10.0000 mg | Freq: Once | INTRAMUSCULAR | Status: AC
  Administered 2021-07-18: 10 mg via INTRAVENOUS
  Filled 2021-07-18: qty 1

## 2021-07-18 MED ORDER — SODIUM CHLORIDE 0.9 % IV BOLUS
1000.0000 mL | Freq: Once | INTRAVENOUS | Status: AC
  Administered 2021-07-18: 1000 mL via INTRAVENOUS

## 2021-07-18 MED ORDER — ALBUTEROL SULFATE (2.5 MG/3ML) 0.083% IN NEBU
15.0000 mg/h | INHALATION_SOLUTION | Freq: Once | RESPIRATORY_TRACT | Status: AC
  Administered 2021-07-18: 15 mg/h via RESPIRATORY_TRACT
  Filled 2021-07-18: qty 18

## 2021-07-18 NOTE — ED Provider Notes (Signed)
[facility] EMERGENCY DEPARTMENT Provider Note   CSN: [id number] Arrival date & time: 07/18/21  2038     History  Chief Complaint  Patient presents with   Respiratory Distress    Derek Owen is a 56 y.o. male history of hypertension, here presenting with respiratory distress.  Patient was diagnosed with COVID on 12/23 at urgent care.  Patient was prescribed a course of Paxlovid.  Patient then went to [facility] on January 2.  At that time he had an elevated D-dimer but there was no CT scanner so he had a VQ scan that was done that showed low probability.  Patient was given steroids and was thought to have a COPD exacerbation at discharge home with steroids and albuterol.  Patient states that for the last several days, he had worsening shortness of breath.  He called EMS today and was noted to be hypoxic with a oxygen saturation in the 80s.  Patient was put on a nonrebreather.  Albuterol nebulizer was given prior to arrival  The history is provided by the patient.      Home Medications Prior to Admission medications   Medication Sig Start Date End Date Taking? Authorizing Provider  albuterol (VENTOLIN HFA) 108 (90 Base) MCG/ACT inhaler Inhale 1-2 puffs into the lungs every 4 (four) hours as needed for wheezing or shortness of breath. 01/14/71   Campbell Stall P, DO  amLODipine (NORVASC) 10 MG tablet Take 1 tablet (10 mg total) by mouth daily. 06/17/21   Fayrene Helper, MD  aspirin 81 MG chewable tablet Chew by mouth daily.    [provider]  atorvastatin (LIPITOR) 40 MG tablet Take 1 tablet (40 mg total) by mouth daily. 06/17/21   Fayrene Helper, MD  Ciclopirox 0.77 % gel SMARTSIG:1 Topical Daily PRN 05/14/21   [provider]  folic acid (FOLVITE) 1 MG tablet Take 1 tablet (1 mg total) by mouth daily. 05/03/21   Rice, Resa Miner, MD  glipiZIDE (GLUCOTROL) 10 MG tablet Take 1 tablet (10 mg total) by mouth daily before breakfast.  06/17/21   Fayrene Helper, MD  guaiFENesin (ROBITUSSIN) 100 MG/5ML liquid Take 5-10 mLs (100-200 mg total) by mouth every 4 (four) hours as needed for cough or to loosen phlegm. 0/9/47   Campbell Stall P, DO  ibuprofen (ADVIL) 600 MG tablet Take 1 tablet (600 mg total) by mouth every 6 (six) hours as needed for mild pain, fever or headache. 0/9/62   Campbell Stall P, DO  ibuprofen (ADVIL) 800 MG tablet Take 1 tablet (800 mg total) by mouth every 8 (eight) hours as needed. Patient not taking: Reported on 05/17/2021 10/08/20   Noreene Larsson, NP  losartan-hydrochlorothiazide (HYZAAR) 100-25 MG tablet Take 1 tablet by mouth daily. 06/17/21   Fayrene Helper, MD  methotrexate (RHEUMATREX) 2.5 MG tablet Take 10 tablets (25 mg total) by mouth once a week. Caution:Chemotherapy. Protect from light. 05/17/21   Rice, Resa Miner, MD  Semaglutide (RYBELSUS) 14 MG TABS Take 14 mg by mouth daily. 04/26/21   Brita Romp, NP      Allergies    Patient has no known allergies.    Review of Systems   Review of Systems  Respiratory:  Positive for shortness of breath.   All other systems reviewed and are negative.  Physical Exam Updated Vital Signs BP 137/90 (BP Location: Left Arm)    Pulse (!) 116    Temp 98.3 F (36.8 C) (  Oral)    Resp (!) 22    Ht 6' (1.829 m)    Wt (!) 142.4 kg    SpO2 99%    BMI 42.59 kg/m  Physical Exam Vitals and nursing note reviewed.  Constitutional:      Comments: Tachypneic and diaphoretic and moderate distress.  Talking in 2-3 word sentences  HENT:     Head: Normocephalic.     Nose: Nose normal.     Mouth/Throat:     Mouth: Mucous membranes are moist.  Eyes:     Extraocular Movements: Extraocular movements intact.     Pupils: Pupils are equal, round, and reactive to light.  Cardiovascular:     Rate and Rhythm: Regular rhythm. Tachycardia present.     Pulses: Normal pulses.     Heart sounds: Normal heart sounds.  Pulmonary:     Comments: Tachypneic and  diminished breath sounds bilaterally.  Mild diffuse wheezing as well Abdominal:     General: Abdomen is flat.     Palpations: Abdomen is soft.  Musculoskeletal:        General: Normal range of motion.     Cervical back: Normal range of motion and neck supple.  Skin:    General: Skin is warm.     Capillary Refill: Capillary refill takes less than 2 seconds.  Neurological:     General: No focal deficit present.     Mental Status: He is alert and oriented to person, place, and time.  Psychiatric:        Mood and Affect: Mood normal.        Behavior: Behavior normal.    ED Results / Procedures / Treatments   Labs (all labs ordered are listed, but only abnormal results are displayed) Labs Reviewed  CBC WITH DIFFERENTIAL/PLATELET - Abnormal; Notable for the following components:      Result Value   WBC 12.6 (*)    Hemoglobin 10.8 (*)    HCT 34.6 (*)    MCV 75.7 (*)    MCH 23.6 (*)    RDW 18.5 (*)    Neutro Abs 10.2 (*)    Monocytes Absolute 1.3 (*)    All other components within normal limits  I-STAT CHEM 8, ED - Abnormal; Notable for the following components:   Sodium 134 (*)    Potassium 3.3 (*)    Chloride 93 (*)    Glucose, Bld 252 (*)    Calcium, Ion 1.08 (*)    Hemoglobin 12.9 (*)    HCT 38.0 (*)    All other components within normal limits  I-STAT ARTERIAL BLOOD GAS, ED - Abnormal; Notable for the following components:   pH, Arterial 7.451 (*)    pO2, Arterial 116 (*)    Bicarbonate 31.0 (*)    Acid-Base Excess 6.0 (*)    Sodium 133 (*)    Potassium 3.3 (*)    HCT 33.0 (*)    Hemoglobin 11.2 (*)    All other components within normal limits  RESP PANEL BY RT-PCR (FLU A&B, COVID) ARPGX2  COMPREHENSIVE METABOLIC PANEL  BRAIN NATRIURETIC PEPTIDE  BLOOD GAS, ARTERIAL  TROPONIN I (HIGH SENSITIVITY)    EKG EKG Interpretation  Date/Time:  Sunday July 18 2021 20:43:59 EST Ventricular Rate:  113 PR Interval:  148 QRS Duration: 86 QT Interval:  372 QTC  Calculation: 511 R Axis:   -5 Text Interpretation: Sinus tachycardia Prolonged QT interval rate faster than previous Confirmed by Wandra Arthurs [phone]) on 07/18/2021  8:48:51 PM  Radiology No results found.  Procedures Procedures    CRITICAL CARE Performed by: Wandra Arthurs   Total critical care time: 30 minutes  Critical care time was exclusive of separately billable procedures and treating other patients.  Critical care was necessary to treat or prevent imminent or life-threatening deterioration.  Critical care was time spent personally by me on the following activities: development of treatment plan with patient and/or surrogate as well as nursing, discussions with consultants, evaluation of patient's response to treatment, examination of patient, obtaining history from patient or surrogate, ordering and performing treatments and interventions, ordering and review of laboratory studies, ordering and review of radiographic studies, pulse oximetry and re-evaluation of patient's condition.   Medications Ordered in ED Medications  albuterol (PROVENTIL) (2.5 MG/3ML) 0.083% nebulizer solution (15 mg/hr Nebulization Given 07/18/21 2107)  ipratropium (ATROVENT) nebulizer solution 1 mg (1 mg Nebulization Given 07/18/21 2107)  dexamethasone (DECADRON) injection 10 mg (10 mg Intravenous Given 07/18/21 2053)    ED Course/ Medical Decision Making/ A&P Clinical Course as of 07/18/21 2311  Sun Jul 18, 2021  2305 CTA to admit; C19 12/23, seen at [facility], dimer was elevated, s/p paxlovid, CT was down and had negative V/q scan. Hypoxic to 80's, ABG okay. CTA to admit. Step down probably, cont nebs + roids,  [SG]    Clinical Course User Index [SG] Jeanell Sparrow, DO                           Medical Decision Making Derek Owen is a 56 y.o. male here presenting with shortness of breath.  Patient has recent COVID.  He does have elevated D-dimer recently but only had VQ scan done because the CT scanner was  down.  Patient is profoundly hypoxic to the 80s.  He does have some diffuse wheezing so consider COPD.  I do have a high suspicion for PE so will get a CTA chest as well.  CBC and CMP and BNP.  Will give continuous neb and start patient on BiPAP   11:11 PM pH 7.45 and CO2 is 44.  Patient is doing well on BiPAP.  Chest x-ray unremarkable.  CTA chest pending.  Signed out to Dr. Pearline Cables in the ED anticipate admission after CTA.   Amount and/or Complexity of Data Reviewed Independent Historian: EMS External Data Reviewed: labs and radiology. Labs: ordered. Decision-making details documented in ED Course. Radiology: ordered and independent interpretation performed. ECG/medicine tests: ordered and independent interpretation performed.  Final Clinical Impression(s) / ED Diagnoses Final diagnoses:  None    Rx / DC Orders ED Discharge Orders     None         Drenda Freeze, MD 07/18/21 2312

## 2021-07-18 NOTE — ED Triage Notes (Signed)
Pt BIBA for increasing SOB, audible wheezing and moderate to heavy WOB on arrival, diaphoretic. Pt has wheezes throughout and rhonchi on left lobes. Per EMS pt was in [facility] with COVID, was DC and then increasing SOB x 2 days.

## 2021-07-18 NOTE — Progress Notes (Signed)
Patient placed on NIV due to increase WOB and SOB. Patient has labored respirations, pursed lip breathing, and speaking to me in broken sentences. BBS to auscultation reveals diffuse expiratory wheezing.  CAT started refer to flowsheet. ABG drawn refer to results. Patient now appears much more comfortable and patients states "his breathing Is much better". RRT will continue to monitor patient clinical presentation and respiratory status.  Goal of care is to aid patient breathing through NIV therapy and alleviate SOB/wheezing w/ inhaled bronchodilators via CAT/NIV. Patient is stable at this time. MD aware of NIV placement and CAT started.   Kedric Bumgarner L. Tamala Julian, BS, RRT-ACCS, RCP

## 2021-07-18 NOTE — ED Notes (Signed)
RT at bedside.

## 2021-07-18 NOTE — ED Notes (Signed)
See this RN triage note

## 2021-07-18 NOTE — ED Notes (Signed)
Pt on BiPap with continuous neb TX. Pt breathing much less labored and tachypnic

## 2021-07-19 ENCOUNTER — Emergency Department (HOSPITAL_COMMUNITY): Payer: Managed Care, Other (non HMO)

## 2021-07-19 ENCOUNTER — Inpatient Hospital Stay (HOSPITAL_COMMUNITY): Payer: Managed Care, Other (non HMO)

## 2021-07-19 DIAGNOSIS — R600 Localized edema: Secondary | ICD-10-CM | POA: Diagnosis not present

## 2021-07-19 DIAGNOSIS — C642 Malignant neoplasm of left kidney, except renal pelvis: Secondary | ICD-10-CM | POA: Diagnosis present

## 2021-07-19 DIAGNOSIS — J869 Pyothorax without fistula: Secondary | ICD-10-CM | POA: Diagnosis present

## 2021-07-19 DIAGNOSIS — Z6841 Body Mass Index (BMI) 40.0 and over, adult: Secondary | ICD-10-CM | POA: Diagnosis not present

## 2021-07-19 DIAGNOSIS — J9601 Acute respiratory failure with hypoxia: Secondary | ICD-10-CM | POA: Diagnosis present

## 2021-07-19 DIAGNOSIS — E1169 Type 2 diabetes mellitus with other specified complication: Secondary | ICD-10-CM | POA: Diagnosis present

## 2021-07-19 DIAGNOSIS — J90 Pleural effusion, not elsewhere classified: Secondary | ICD-10-CM | POA: Diagnosis present

## 2021-07-19 DIAGNOSIS — J918 Pleural effusion in other conditions classified elsewhere: Secondary | ICD-10-CM | POA: Diagnosis present

## 2021-07-19 DIAGNOSIS — R609 Edema, unspecified: Secondary | ICD-10-CM | POA: Diagnosis not present

## 2021-07-19 DIAGNOSIS — I152 Hypertension secondary to endocrine disorders: Secondary | ICD-10-CM | POA: Diagnosis present

## 2021-07-19 DIAGNOSIS — R0603 Acute respiratory distress: Secondary | ICD-10-CM | POA: Diagnosis not present

## 2021-07-19 DIAGNOSIS — N2889 Other specified disorders of kidney and ureter: Secondary | ICD-10-CM | POA: Diagnosis not present

## 2021-07-19 DIAGNOSIS — F1010 Alcohol abuse, uncomplicated: Secondary | ICD-10-CM | POA: Diagnosis present

## 2021-07-19 DIAGNOSIS — J189 Pneumonia, unspecified organism: Secondary | ICD-10-CM

## 2021-07-19 DIAGNOSIS — R1900 Intra-abdominal and pelvic swelling, mass and lump, unspecified site: Secondary | ICD-10-CM | POA: Diagnosis present

## 2021-07-19 DIAGNOSIS — E1159 Type 2 diabetes mellitus with other circulatory complications: Secondary | ICD-10-CM | POA: Diagnosis not present

## 2021-07-19 DIAGNOSIS — Z7982 Long term (current) use of aspirin: Secondary | ICD-10-CM | POA: Diagnosis not present

## 2021-07-19 DIAGNOSIS — E785 Hyperlipidemia, unspecified: Secondary | ICD-10-CM | POA: Diagnosis present

## 2021-07-19 DIAGNOSIS — N179 Acute kidney failure, unspecified: Secondary | ICD-10-CM | POA: Diagnosis present

## 2021-07-19 DIAGNOSIS — E871 Hypo-osmolality and hyponatremia: Secondary | ICD-10-CM | POA: Diagnosis present

## 2021-07-19 DIAGNOSIS — D508 Other iron deficiency anemias: Secondary | ICD-10-CM | POA: Diagnosis present

## 2021-07-19 DIAGNOSIS — J441 Chronic obstructive pulmonary disease with (acute) exacerbation: Secondary | ICD-10-CM

## 2021-07-19 DIAGNOSIS — R9389 Abnormal findings on diagnostic imaging of other specified body structures: Secondary | ICD-10-CM

## 2021-07-19 DIAGNOSIS — D849 Immunodeficiency, unspecified: Secondary | ICD-10-CM | POA: Diagnosis present

## 2021-07-19 DIAGNOSIS — E1165 Type 2 diabetes mellitus with hyperglycemia: Secondary | ICD-10-CM | POA: Diagnosis present

## 2021-07-19 DIAGNOSIS — R0602 Shortness of breath: Secondary | ICD-10-CM | POA: Diagnosis present

## 2021-07-19 DIAGNOSIS — F17210 Nicotine dependence, cigarettes, uncomplicated: Secondary | ICD-10-CM | POA: Diagnosis present

## 2021-07-19 DIAGNOSIS — C782 Secondary malignant neoplasm of pleura: Secondary | ICD-10-CM | POA: Diagnosis present

## 2021-07-19 DIAGNOSIS — E119 Type 2 diabetes mellitus without complications: Secondary | ICD-10-CM

## 2021-07-19 DIAGNOSIS — Z8616 Personal history of COVID-19: Secondary | ICD-10-CM | POA: Diagnosis not present

## 2021-07-19 DIAGNOSIS — D62 Acute posthemorrhagic anemia: Secondary | ICD-10-CM | POA: Diagnosis present

## 2021-07-19 DIAGNOSIS — M069 Rheumatoid arthritis, unspecified: Secondary | ICD-10-CM | POA: Diagnosis present

## 2021-07-19 DIAGNOSIS — Z8673 Personal history of transient ischemic attack (TIA), and cerebral infarction without residual deficits: Secondary | ICD-10-CM | POA: Diagnosis not present

## 2021-07-19 DIAGNOSIS — R1902 Left upper quadrant abdominal swelling, mass and lump: Secondary | ICD-10-CM | POA: Diagnosis not present

## 2021-07-19 DIAGNOSIS — I10 Essential (primary) hypertension: Secondary | ICD-10-CM | POA: Diagnosis present

## 2021-07-19 LAB — CBG MONITORING, ED
Glucose-Capillary: 254 mg/dL — ABNORMAL HIGH (ref 70–99)
Glucose-Capillary: 274 mg/dL — ABNORMAL HIGH (ref 70–99)
Glucose-Capillary: 305 mg/dL — ABNORMAL HIGH (ref 70–99)
Glucose-Capillary: 321 mg/dL — ABNORMAL HIGH (ref 70–99)
Glucose-Capillary: 324 mg/dL — ABNORMAL HIGH (ref 70–99)
Glucose-Capillary: 355 mg/dL — ABNORMAL HIGH (ref 70–99)

## 2021-07-19 LAB — HEMOGLOBIN A1C
Hgb A1c MFr Bld: 8 % — ABNORMAL HIGH (ref 4.8–5.6)
Mean Plasma Glucose: 183 mg/dL

## 2021-07-19 LAB — CBC
HCT: 31.5 % — ABNORMAL LOW (ref 39.0–52.0)
Hemoglobin: 9.7 g/dL — ABNORMAL LOW (ref 13.0–17.0)
MCH: 23.3 pg — ABNORMAL LOW (ref 26.0–34.0)
MCHC: 30.8 g/dL (ref 30.0–36.0)
MCV: 75.7 fL — ABNORMAL LOW (ref 80.0–100.0)
Platelets: 204 10*3/uL (ref 150–400)
RBC: 4.16 MIL/uL — ABNORMAL LOW (ref 4.22–5.81)
RDW: 18.2 % — ABNORMAL HIGH (ref 11.5–15.5)
WBC: 10.9 10*3/uL — ABNORMAL HIGH (ref 4.0–10.5)
nRBC: 0 % (ref 0.0–0.2)

## 2021-07-19 LAB — BODY FLUID CELL COUNT WITH DIFFERENTIAL
Eos, Fluid: 0 %
Lymphs, Fluid: 8 %
Monocyte-Macrophage-Serous Fluid: 49 % — ABNORMAL LOW (ref 50–90)
Neutrophil Count, Fluid: 42 % — ABNORMAL HIGH (ref 0–25)
Other Cells, Fluid: 1 %
Total Nucleated Cell Count, Fluid: 211 cu mm (ref 0–1000)

## 2021-07-19 LAB — LACTIC ACID, PLASMA
Lactic Acid, Venous: 2 mmol/L (ref 0.5–1.9)
Lactic Acid, Venous: 1.9 mmol/L (ref 0.5–1.9)

## 2021-07-19 LAB — LACTATE DEHYDROGENASE: LDH: 158 U/L (ref 98–192)

## 2021-07-19 LAB — ECHOCARDIOGRAM COMPLETE
AR max vel: 2.61 cm2
AV Peak grad: 5.3 mmHg
Ao pk vel: 1.15 m/s
Area-P 1/2: 5.23 cm2
Calc EF: 58.2 %
Height: 72 in
S' Lateral: 2.6 cm
Single Plane A2C EF: 54.9 %
Single Plane A4C EF: 57.5 %
Weight: 5024 oz

## 2021-07-19 LAB — LACTATE DEHYDROGENASE, PLEURAL OR PERITONEAL FLUID: LD, Fluid: 471 U/L — ABNORMAL HIGH (ref 3–23)

## 2021-07-19 LAB — PROCALCITONIN: Procalcitonin: <0.1 ng/mL

## 2021-07-19 LAB — PROTEIN, TOTAL: Total Protein: 6.4 g/dL — ABNORMAL LOW (ref 6.5–8.1)

## 2021-07-19 LAB — BASIC METABOLIC PANEL
Anion gap: 10 (ref 5–15)
BUN: 14 mg/dL (ref 6–20)
CO2: 27 mmol/L (ref 22–32)
Calcium: 8.1 mg/dL — ABNORMAL LOW (ref 8.9–10.3)
Chloride: 95 mmol/L — ABNORMAL LOW (ref 98–111)
Creatinine, Ser: 1.06 mg/dL (ref 0.61–1.24)
GFR, Estimated: >60 mL/min (ref >60)
Glucose, Bld: 284 mg/dL — ABNORMAL HIGH (ref 70–99)
Potassium: 4.1 mmol/L (ref 3.5–5.1)
Sodium: 132 mmol/L — ABNORMAL LOW (ref 135–145)

## 2021-07-19 LAB — PROTEIN, PLEURAL OR PERITONEAL FLUID: Total protein, fluid: 4.9 g/dL

## 2021-07-19 LAB — TROPONIN I (HIGH SENSITIVITY): Troponin I (High Sensitivity): 14 ng/L (ref <18)

## 2021-07-19 LAB — GLUCOSE, PLEURAL OR PERITONEAL FLUID: Glucose, Fluid: <20 mg/dL

## 2021-07-19 LAB — HIV ANTIBODY (ROUTINE TESTING W REFLEX): HIV Screen 4th Generation wRfx: NONREACTIVE

## 2021-07-19 MED ORDER — FUROSEMIDE 10 MG/ML IJ SOLN
80.0000 mg | Freq: Two times a day (BID) | INTRAMUSCULAR | Status: DC
  Administered 2021-07-19 – 2021-07-20 (×3): 80 mg via INTRAVENOUS
  Filled 2021-07-19 (×3): qty 8

## 2021-07-19 MED ORDER — POTASSIUM CHLORIDE 10 MEQ/100ML IV SOLN
10.0000 meq | INTRAVENOUS | Status: AC
  Administered 2021-07-19 (×2): 10 meq via INTRAVENOUS
  Filled 2021-07-19 (×2): qty 100

## 2021-07-19 MED ORDER — LORAZEPAM 2 MG/ML IJ SOLN: 1.0000 mg | INTRAMUSCULAR | Status: DC | PRN

## 2021-07-19 MED ORDER — IOHEXOL 350 MG/ML SOLN
100.0000 mL | Freq: Once | INTRAVENOUS | Status: AC | PRN
  Administered 2021-07-19: 100 mL via INTRAVENOUS

## 2021-07-19 MED ORDER — THIAMINE HCL 100 MG PO TABS
100.0000 mg | ORAL_TABLET | Freq: Every day | ORAL | Status: DC
  Administered 2021-07-19 – 2021-07-30 (×11): 100 mg via ORAL
  Filled 2021-07-19 (×11): qty 1

## 2021-07-19 MED ORDER — ADULT MULTIVITAMIN W/MINERALS CH
1.0000 | ORAL_TABLET | Freq: Every day | ORAL | Status: DC
  Administered 2021-07-19 – 2021-07-30 (×11): 1 via ORAL
  Filled 2021-07-19 (×11): qty 1

## 2021-07-19 MED ORDER — ENOXAPARIN SODIUM 40 MG/0.4ML IJ SOSY
40.0000 mg | PREFILLED_SYRINGE | INTRAMUSCULAR | Status: DC
  Administered 2021-07-19: 40 mg via SUBCUTANEOUS
  Filled 2021-07-19: qty 0.4

## 2021-07-19 MED ORDER — SODIUM CHLORIDE 0.9 % IV SOLN
1.0000 g | Freq: Once | INTRAVENOUS | Status: AC
  Administered 2021-07-19: 1 g via INTRAVENOUS
  Filled 2021-07-19: qty 10

## 2021-07-19 MED ORDER — POTASSIUM CHLORIDE CRYS ER 20 MEQ PO TBCR
40.0000 meq | EXTENDED_RELEASE_TABLET | Freq: Two times a day (BID) | ORAL | Status: AC
  Administered 2021-07-19 (×2): 40 meq via ORAL
  Filled 2021-07-19 (×3): qty 2

## 2021-07-19 MED ORDER — ONDANSETRON HCL 4 MG PO TABS: 4.0000 mg | ORAL_TABLET | Freq: Four times a day (QID) | ORAL | Status: DC | PRN

## 2021-07-19 MED ORDER — INSULIN ASPART 100 UNIT/ML IJ SOLN
0.0000 [IU] | INTRAMUSCULAR | Status: DC
  Administered 2021-07-19: 5 [IU] via SUBCUTANEOUS
  Administered 2021-07-19 (×2): 7 [IU] via SUBCUTANEOUS
  Administered 2021-07-19: 5 [IU] via SUBCUTANEOUS
  Administered 2021-07-19: 06:00:00 9 [IU] via SUBCUTANEOUS
  Administered 2021-07-19: 7 [IU] via SUBCUTANEOUS
  Administered 2021-07-20: 5 [IU] via SUBCUTANEOUS
  Administered 2021-07-20: 7 [IU] via SUBCUTANEOUS
  Administered 2021-07-20 (×3): 3 [IU] via SUBCUTANEOUS
  Administered 2021-07-20: 7 [IU] via SUBCUTANEOUS
  Administered 2021-07-21: 19:00:00 3 [IU] via SUBCUTANEOUS
  Administered 2021-07-21 (×4): 2 [IU] via SUBCUTANEOUS
  Administered 2021-07-21: 3 [IU] via SUBCUTANEOUS
  Administered 2021-07-22: 04:00:00 2 [IU] via SUBCUTANEOUS
  Administered 2021-07-22: 3 [IU] via SUBCUTANEOUS
  Administered 2021-07-22: 9 [IU] via SUBCUTANEOUS
  Administered 2021-07-22: 13:00:00 5 [IU] via SUBCUTANEOUS
  Administered 2021-07-22: 17:00:00 7 [IU] via SUBCUTANEOUS
  Administered 2021-07-23: 3 [IU] via SUBCUTANEOUS
  Administered 2021-07-23 (×4): 2 [IU] via SUBCUTANEOUS
  Administered 2021-07-23: 3 [IU] via SUBCUTANEOUS
  Administered 2021-07-23: 21:00:00 5 [IU] via SUBCUTANEOUS
  Administered 2021-07-24: 3 [IU] via SUBCUTANEOUS
  Administered 2021-07-24: 2 [IU] via SUBCUTANEOUS
  Administered 2021-07-24: 21:00:00 5 [IU] via SUBCUTANEOUS
  Administered 2021-07-24: 12:00:00 2 [IU] via SUBCUTANEOUS
  Administered 2021-07-25: 9 [IU] via SUBCUTANEOUS
  Administered 2021-07-25: 7 [IU] via SUBCUTANEOUS
  Administered 2021-07-25 – 2021-07-26 (×2): 2 [IU] via SUBCUTANEOUS
  Administered 2021-07-26: 3 [IU] via SUBCUTANEOUS
  Administered 2021-07-26: 5 [IU] via SUBCUTANEOUS
  Administered 2021-07-26: 2 [IU] via SUBCUTANEOUS
  Administered 2021-07-26: 5 [IU] via SUBCUTANEOUS
  Administered 2021-07-27: 2 [IU] via SUBCUTANEOUS
  Administered 2021-07-27: 1 [IU] via SUBCUTANEOUS

## 2021-07-19 MED ORDER — FOLIC ACID 1 MG PO TABS
1.0000 mg | ORAL_TABLET | Freq: Every day | ORAL | Status: DC
  Administered 2021-07-19 – 2021-07-30 (×11): 1 mg via ORAL
  Filled 2021-07-19 (×11): qty 1

## 2021-07-19 MED ORDER — SODIUM CHLORIDE 0.9 % IV SOLN
500.0000 mg | Freq: Once | INTRAVENOUS | Status: AC
  Administered 2021-07-19: 500 mg via INTRAVENOUS
  Filled 2021-07-19: qty 5

## 2021-07-19 MED ORDER — ONDANSETRON HCL 4 MG/2ML IJ SOLN: 4.0000 mg | Freq: Four times a day (QID) | INTRAMUSCULAR | Status: DC | PRN

## 2021-07-19 MED ORDER — THIAMINE HCL 100 MG/ML IJ SOLN
100.0000 mg | Freq: Every day | INTRAMUSCULAR | Status: DC
  Filled 2021-07-19 (×2): qty 2

## 2021-07-19 MED ORDER — INSULIN GLARGINE-YFGN 100 UNIT/ML ~~LOC~~ SOLN
20.0000 [IU] | Freq: Every day | SUBCUTANEOUS | Status: DC
  Administered 2021-07-19: 20 [IU] via SUBCUTANEOUS
  Filled 2021-07-19 (×2): qty 0.2

## 2021-07-19 MED ORDER — ACETAMINOPHEN 650 MG RE SUPP: 650.0000 mg | Freq: Four times a day (QID) | RECTAL | Status: DC | PRN

## 2021-07-19 MED ORDER — PERFLUTREN LIPID MICROSPHERE
1.0000 mL | INTRAVENOUS | Status: AC | PRN
  Administered 2021-07-19: 2 mL via INTRAVENOUS
  Filled 2021-07-19: qty 10

## 2021-07-19 MED ORDER — ACETAMINOPHEN 325 MG PO TABS: 650.0000 mg | ORAL_TABLET | Freq: Four times a day (QID) | ORAL | Status: DC | PRN

## 2021-07-19 MED ORDER — LORAZEPAM 1 MG PO TABS: 1.0000 mg | ORAL_TABLET | ORAL | Status: DC | PRN

## 2021-07-19 MED ORDER — FUROSEMIDE 10 MG/ML IJ SOLN
40.0000 mg | Freq: Once | INTRAMUSCULAR | Status: AC
  Administered 2021-07-19: 40 mg via INTRAVENOUS
  Filled 2021-07-19: qty 4

## 2021-07-19 NOTE — Procedures (Signed)
Thoracentesis  Procedure Note  MOIZ RYANT  [id number]  [birth year]  Date:07/19/21  Time:10:37 AM   Provider Performing:Raechal Raben V. Rheanne Cortopassi   Procedure: Thoracentesis with imaging guidance (81157)  Indication(s) Pleural Effusion  Consent Risks of the procedure as well as the alternatives and risks of each were explained to the patient and/or caregiver.  Consent for the procedure was obtained and is signed in the bedside chart  Anesthesia Topical only with 1% lidocaine    Time Out Verified patient identification, verified procedure, site/side was marked, verified correct patient position, special equipment/implants available, medications/allergies/relevant history reviewed, required imaging and test results available.   Sterile Technique Maximal sterile technique including full sterile barrier drape, hand hygiene, sterile gown, sterile gloves, mask, hair covering, sterile ultrasound probe cover (if used).  Procedure Description Pt was taken off biPAP & placed on NRB.  Ultrasound was used to identify appropriate pleural anatomy for placement and overlying skin marked.  Area of drainage cleaned and draped in sterile fashion. Lidocaine was used to anesthetize the skin and subcutaneous tissue. Sample of bloody fluid was drawn & examined for 5 mins to ensure no clotting. 600cc's of bloody appearing fluid was then drained from the left pleural space. Fluid seemed to be under pressure initially. Catheter then removed and bandaid applied to site. Breathing improved towards end of procedure   Complications/Tolerance None; patient tolerated the procedure well. Chest X-ray is ordered to confirm no post-procedural complication.   EBL Minimal   Specimen(s) Pleural fluid   Malyn Aytes V. Elsworth Soho MD

## 2021-07-19 NOTE — H&P (Signed)
History and Physical    Derek Owen YTK:[id number] DOB: Jul 25, 1965 DOA: 07/18/2021  PCP: Renee Rival, FNP  Patient coming from: Home  I have personally briefly reviewed patient's old medical records in [place]  Chief Complaint: Resp distress  HPI: Derek Owen is a 56 y.o. male with medical history significant of HTN, DM2, EtOH abuse.  Pt presents to ED with c/o respiratory distress.  Pt diagnosed with COVID on 12/23.  Took course of Paxlovid.  Pt went to [facility] on Jan 2.  Elevated D.Dimer, no CT scanner so VQ scan done that showed low prob for PE, given steroids and thought to have COPD exacerbation, discharged home with steroids and albuterol.  Pt having increasing SOB over past several days.  Became severe today with respiratory distress.  EMS called.   ED Course: Pt stabilized on rescue BIPAP.  CTA chest =  No PE  But has large B pleural effusiosn  Also a partially visualized LUQ mass.  Got 40mg  IV lasix in ED.  BNP nl though.   Past Medical History:  Diagnosis Date   Alcohol use disorder, mild, abuse    Cigarette smoker    Diabetes mellitus without complication ([place])    Hyperlipidemia    Hypertension    Polycythemia 04/13/2020   Stroke ([place])    x3    History reviewed. No pertinent surgical history.   reports that he has been smoking cigarettes. He has been smoking an average of 1 pack per day. He has never used smokeless tobacco. He reports current alcohol use. He reports that he does not currently use drugs.  No Known Allergies  Family History  Adopted: Yes    Prior to Admission medications   Medication Sig Start Date End Date Taking? Authorizing Provider  albuterol (VENTOLIN HFA) 108 (90 Base) MCG/ACT inhaler Inhale 1-2 puffs into the lungs every 4 (four) hours as needed for wheezing or shortness of breath. 11/13/71  Yes Campbell Stall P, DO  amLODipine (NORVASC) 10 MG tablet Take 1 tablet (10 mg total) by mouth daily. 06/17/21  Yes  Fayrene Helper, MD  aspirin 81 MG chewable tablet Chew 81 mg by mouth daily.   Yes [provider]  atorvastatin (LIPITOR) 40 MG tablet Take 1 tablet (40 mg total) by mouth daily. 06/17/21  Yes Fayrene Helper, MD  folic acid (FOLVITE) 1 MG tablet Take 1 tablet (1 mg total) by mouth daily. 05/03/21  Yes Rice, Resa Miner, MD  glipiZIDE (GLUCOTROL) 10 MG tablet Take 1 tablet (10 mg total) by mouth daily before breakfast. 06/17/21  Yes Fayrene Helper, MD  guaiFENesin (ROBITUSSIN) 100 MG/5ML liquid Take 5-10 mLs (100-200 mg total) by mouth every 4 (four) hours as needed for cough or to loosen phlegm. 08/12/00  Yes Campbell Stall P, DO  ibuprofen (ADVIL) 600 MG tablet Take 1 tablet (600 mg total) by mouth every 6 (six) hours as needed for mild pain, fever or headache. 11/11/25  Yes Campbell Stall P, DO  losartan-hydrochlorothiazide (HYZAAR) 100-25 MG tablet Take 1 tablet by mouth daily. 06/17/21  Yes Fayrene Helper, MD  methotrexate (RHEUMATREX) 2.5 MG tablet Take 10 tablets (25 mg total) by mouth once a week. Caution:Chemotherapy. Protect from light. Patient taking differently: Take 15 mg by mouth every Friday. 6 tabs 05/17/21  Yes Rice, Resa Miner, MD  Pseudoephedrine-APAP-DM (DAYQUIL PO) Take 30 mLs by mouth 2 (two) times daily as needed (cold/cough).   Yes [provider]  Semaglutide (RYBELSUS) 14 MG TABS Take 14 mg by mouth daily. 04/26/21  Yes Brita Romp, NP  ibuprofen (ADVIL) 800 MG tablet Take 1 tablet (800 mg total) by mouth every 8 (eight) hours as needed. Patient not taking: Reported on 05/17/2021 10/08/20   Noreene Larsson, NP  PAXLOVID, 300/100, 20 x 150 MG & 10 x 100MG  TBPK Take 3 tablets by mouth See admin instructions. Tid x 5 days Patient not taking: Reported on 07/19/2021 07/05/21   [provider]  predniSONE (DELTASONE) 10 MG tablet Take 10 mg by mouth See admin instructions. 40 qd x 2 days, 30 qd x 2 days, 20 mg qd x 2 days, 10 mg  Qd x 2  days,then stop Patient not taking: Reported on 07/19/2021 07/02/21   [provider]    Physical Exam: Vitals:   07/19/21 0300 07/19/21 0418 07/19/21 0500 07/19/21 0530  BP: 126/77 (!) 170/96 (!) 132/92 (!) 128/96  Pulse: 99 (!) 114 (!) 113 (!) 107  Resp: 18 (!) 31 (!) 26 20  Temp:      TempSrc:      SpO2: 98% 100% 99% 98%  Weight:      Height:        Constitutional: On bipap but resp distress seems improved from what was described earlier on EDP notes. Respiratory: Diminished BLL, no tachypnea, distress seems improved compared to descriptions on earlier notes Cardiovascular: Tachycardia Neurologic: Speech clear, grossly non-focal Psychiatric: Normal judgment and insight. Alert and oriented x 3. Normal mood.    Labs on Admission: I have personally reviewed following labs and imaging studies  CBC: Recent Labs  Lab 07/18/21 2045 07/18/21 2056 07/18/21 2118  WBC 12.6*  --   --   NEUTROABS 10.2*  --   --   HGB 10.8* 12.9* 11.2*  HCT 34.6* 38.0* 33.0*  MCV 75.7*  --   --   PLT 203  --   --    Basic Metabolic Panel: Recent Labs  Lab 07/18/21 2045 07/18/21 2056 07/18/21 2118  NA 133* 134* 133*  K 3.3* 3.3* 3.3*  CL 90* 93*  --   CO2 26  --   --   GLUCOSE 255* 252*  --   BUN 13 15  --   CREATININE 1.04 0.90  --   CALCIUM 9.0  --   --    GFR: Estimated Creatinine Clearance: 134.2 mL/min (by C-G formula based on SCr of 0.9 mg/dL). Liver Function Tests: Recent Labs  Lab 07/18/21 2045  AST 22  ALT 18  ALKPHOS 78  BILITOT 1.1  PROT 6.7  ALBUMIN 3.4*   No results for input(s): LIPASE, AMYLASE in the last 168 hours. No results for input(s): AMMONIA in the last 168 hours. Coagulation Profile: No results for input(s): INR, PROTIME in the last 168 hours. Cardiac Enzymes: No results for input(s): CKTOTAL, CKMB, CKMBINDEX, TROPONINI in the last 168 hours. BNP (last 3 results) No results for input(s): PROBNP in the last 8760 hours. HbA1C: No results for  input(s): HGBA1C in the last 72 hours. CBG: No results for input(s): GLUCAP in the last 168 hours. Lipid Profile: No results for input(s): CHOL, HDL, LDLCALC, TRIG, CHOLHDL, LDLDIRECT in the last 72 hours. Thyroid Function Tests: No results for input(s): TSH, T4TOTAL, FREET4, T3FREE, THYROIDAB in the last 72 hours. Anemia Panel: No results for input(s): VITAMINB12, FOLATE, FERRITIN, TIBC, IRON, RETICCTPCT in the last 72 hours. Urine analysis: No results found for: COLORURINE, APPEARANCEUR, [place], [place], [place],  HGBUR, BILIRUBINUR, KETONESUR, PROTEINUR, UROBILINOGEN, NITRITE, LEUKOCYTESUR  Radiological Exams on Admission: CT Angio Chest PE W and/or Wo Contrast  Result Date: 07/19/2021 CLINICAL DATA:  Pulmonary embolism suspected, high probability. EXAM: CT ANGIOGRAPHY CHEST WITH CONTRAST TECHNIQUE: Multidetector CT imaging of the chest was performed using the standard protocol during bolus administration of intravenous contrast. Multiplanar CT image reconstructions and MIPs were obtained to evaluate the vascular anatomy. CONTRAST:  134mL OMNIPAQUE IOHEXOL 350 MG/ML SOLN COMPARISON:  None. FINDINGS: Cardiovascular: The heart is normal in size and there is no pericardial effusion. Scattered coronary artery calcifications are noted. There is atherosclerotic calcification of the aorta without evidence of aneurysm. Pulmonary trunk is normal in caliber. No large central pulmonary artery filling defect is identified. There is suboptimal opacification and compressive atelectasis evaluation of the segmental and subsegmental arteries. Mediastinum/Nodes: No mediastinal, hilar, or axillary lymphadenopathy. A subcentimeter hypodensity is present in the left lobe of the thyroid gland which is too small to further characterize. The trachea and esophagus are within normal limits. Lungs/Pleura: There are large bilateral pleural effusion with compressive atelectasis bilaterally. No pneumothorax. Upper Abdomen: A  rim calcified mass is present in the left upper quadrant inferior to the spleen, only partially visualized on exam. Musculoskeletal: Degenerative changes in the thoracic spine. No acute osseous abnormality. Review of the MIP images confirms the above findings. IMPRESSION: 1. No large central pulmonary artery embolism. There is suboptimal evaluation of the segmental subsegmental arteries. 2. Large bilateral pleural effusions with compressive atelectasis. 3. Coronary artery calcifications. 4. Aortic atherosclerosis. 5. Rim calcified mass in the left upper quadrant of unknown etiology. CT abdomen and pelvis is recommended for further evaluation. Electronically Signed   By: Brett Fairy M.D.   On: 07/19/2021 04:32   DG Chest Port 1 View  Result Date: 07/18/2021 CLINICAL DATA:  Shortness of breath EXAM: PORTABLE CHEST 1 VIEW COMPARISON:  07/12/2021 FINDINGS: Bilateral lower lobe airspace opacities and layering effusions. This is stable on the left, worsened on the right since prior study. Heart is borderline in size. No acute bony abnormality. IMPRESSION: Bilateral effusions with lower lobe atelectasis or infiltrates, worsening on the right since prior study. Electronically Signed   By: Rolm Baptise M.D.   On: 07/18/2021 22:14    EKG: Independently reviewed.  Assessment/Plan Principal Problem:   Acute respiratory failure with hypoxia (HCC) Active Problems:   DM2 (diabetes mellitus, type 2) ([place])   Hypertension associated with diabetes ([place])   Pleural effusion   Mass of abdomen    Acute resp failure with hypoxia - Patient has acute respiratory failure with hypoxia due to having a new oxygen requirement.  That is the patient has a PaO2 < 60 (pulse Ox < 90%) on room air. Secondary to large bilateral pleural effusions DDx is broad and includes infectious, neoplastic, and cardiogenic etiologies IR thoracentesis in AM for diagnostic and therapeutic purposes Cont BIPAP for now Got empiric ABx in ED, will  hold off on re-ordering for the moment Got lasix in ED Procalcitonin pending BNP not elevated 2d echo ordered Mass of abdomen - CT [facility] ordered when more stable to further evaluate DM2 - Hold home meds Sensitive SSI Q4H HTN - Holding home meds while NPO on BIPAP Add PRN if needed H/o EtOH abuse -  CIWA Hypokalemia - Mild Ordering 30meq IV K  DVT prophylaxis: Lovenox Code Status: Full Family Communication: No family in room Disposition Plan: Home after breathing improved Consults called: None Admission status: Admit to inpatient  Severity  of Illness: The appropriate patient status for this patient is INPATIENT. Inpatient status is judged to be reasonable and necessary in order to provide the required intensity of service to ensure the patient's safety. The patient's presenting symptoms, physical exam findings, and initial radiographic and laboratory data in the context of their chronic comorbidities is felt to place them at high risk for further clinical deterioration. Furthermore, it is not anticipated that the patient will be medically stable for discharge from the hospital within 2 midnights of admission.   * I certify that at the point of admission it is my clinical judgment that the patient will require inpatient hospital care spanning beyond 2 midnights from the point of admission due to high intensity of service, high risk for further deterioration and high frequency of surveillance required.*   Justinn Welter M. DO [facility]  How to contact the [facility] Attending or Consulting provider [place] or covering provider during after hours [place], for this patient?  Check the care team in [facility] and look for a) attending/consulting [facility] provider listed and b) the [facility] team listed Log into [email or web address]  Amion Physician Scheduling and messaging for groups and whole hospitals  On call and physician scheduling software for group practices, residents, hospitalists and other medical  providers for call, clinic, rotation and shift schedules. OnCall [employer] is a hospital-wide system for scheduling doctors and paging doctors on call. EasyPlot is for scientific plotting and data analysis.  [email or web address]  and use [place]'s universal password to access. If you do not have the password, please contact the hospital operator.  Locate the [facility] provider you are looking for under [facility] and page to a number that you can be directly reached. If you still have difficulty reaching the provider, please page the [facility] (Director on Call) for the Hospitalists listed on amion for assistance.  07/19/2021, 5:55 AM

## 2021-07-19 NOTE — ED Notes (Signed)
Elsworth Soho, MD at bedside for bedside thoracentesis. Consent signed by MD, pt, and this RN. Thoracentesis performed without incident. Portable Xray at bedside per MD request. Pt removed from BiPAP and placed on NRB at 15L/min per MD request. Pt tolerating NRB well. Pt resting comfortably at this time.

## 2021-07-19 NOTE — Procedures (Signed)
Thoracentesis  Procedure Note  Derek Owen  [id number]  [birth year]  Date:07/19/21  Time:3:19 PM   Provider Performing:Jaymi Tinner V. Sohaib Vereen   Procedure: Thoracentesis with imaging guidance (64383)  Indication(s) Pleural Effusion  Consent Risks of the procedure as well as the alternatives and risks of each were explained to the patient and/or caregiver.  Consent for the procedure was obtained and is signed in the bedside chart  Anesthesia Topical only with 1% lidocaine    Time Out Verified patient identification, verified procedure, site/side was marked, verified correct patient position, special equipment/implants available, medications/allergies/relevant history reviewed, required imaging and test results available.   Sterile Technique Maximal sterile technique including full sterile barrier drape, hand hygiene, sterile gown, sterile gloves, mask, hair covering, sterile ultrasound probe cover (if used).  Procedure Description Ultrasound was used to identify appropriate pleural anatomy for placement and overlying skin marked.  Area of drainage cleaned and draped in sterile fashion. Lidocaine was used to anesthetize the skin and subcutaneous tissue.  1500 cc's of bloody appearing fluid was drained from the right pleural space. Catheter then removed and bandaid applied to site.   Complications/Tolerance None; patient tolerated the procedure well. Chest X-ray is ordered to confirm no post-procedural complication.   EBL Minimal   Specimen(s) Pleural fluid   Derek Owen V. Elsworth Soho MD

## 2021-07-19 NOTE — Progress Notes (Signed)
Inpatient Diabetes Program Recommendations  AACE/ADA: New Consensus Statement on Inpatient Glycemic Control (2015)  Target Ranges:  Prepandial:   less than 140 mg/dL      Peak postprandial:   less than 180 mg/dL (1-2 hours)      Critically ill patients:  140 - 180 mg/dL   Lab Results  Component Value Date   GLUCAP 324 (H) 07/19/2021   HGBA1C 8.7 (A) 04/26/2021    Review of Glycemic Control  Latest Reference Range & Units 07/19/21 06:10 07/19/21 08:29  Glucose-Capillary 70 - 99 mg/dL 355 (H) 324 (H)   COVID on 12/23 bilateral pleural effusions/ resp distress Diabetes history: DM 2 Outpatient Diabetes medications: Glipizide 10 mg Daily, Rybelsus 14 mg Daily Current orders for Inpatient glycemic control:  Novolog 0-9 units Q4 hours  Pt received Decadron 10 mg yesterday  Inpatient Diabetes Program Recommendations:    -  Start Semglee 25 units  Thanks,  Tama Headings RN, MSN, BC-ADM Inpatient Diabetes Coordinator Team Pager [phone] (8a-5p)

## 2021-07-19 NOTE — Progress Notes (Signed)
PROGRESS NOTE    Derek Owen  ZOX:[id number] DOB: January 11, 1966 DOA: 07/18/2021 PCP: Renee Rival, FNP  Brief Narrative: 55/M with history of type 2 diabetes mellitus, alcohol abuse, hypertension presented to the ED with respiratory distress, he was diagnosed with COVID on 12/23, treated with a course of Paxlovid, then presented to [facility] ED on 1/2 with shortness of breath, had a VQ scan which was low probability, discharged home on steroids and albuterol.  Continue to have progressive dyspnea on exertion and presented to the ED with significantly worsening dyspnea, lower extremity edema. -In the emergency room he had a CTA chest which showed no PE, large bilateral pleural effusions,?  Partially visualized LUQ mass   Assessment & Plan:   Acute respiratory failure with hypoxia (HCC) Large bilateral pleural effusions -Currently on rescue BiPAP -Start IV Lasix 80 mg every 12 today -Will request pulmonary consult for thoracentesis, I do not think he is stable to go down to radiology on BiPAP -Follow-up 2D echocardiogram  ?  Partially visualized LUQ mass -Will obtain CT abdomen pelvis with IV contrast when respiratory status is more stable  Type 2 diabetes mellitus, uncontrolled with hyperglycemia -Oral hypoglycemics on hold -CBGs high in the 300s, add Semglee, meal coverage and -Check hemoglobin A1c  Hypertension -Antihypertensives on hold  History of alcohol abuse -Thiamine, monitor on CIWA protocol  Hypokalemia -Replaced   DVT prophylaxis: Lovenox Code Status: Full code Family Communication: Discussed patient in detail, no family at bedside Disposition Plan:  Status is: Inpatient  Remains inpatient appropriate because: Severity of illness    Consultants:  PCCM  Procedures:   Antimicrobials:    Subjective: -Patient seen on BiPAP, complains of shortness of breath  Objective: Vitals:   07/19/21 0841 07/19/21 0900 07/19/21 0930 07/19/21 1010  BP: (!) 131/97  (!) 150/95 (!) 140/111 (!) 148/106  Pulse: (!) 117 (!) 119 (!) 118 (!) 123  Resp: (!) 32 (!) 30 (!) 25 (!) 29  Temp:      TempSrc:      SpO2: 99% 100% 100% 100%  Weight:      Height:        Intake/Output Summary (Last 24 hours) at 07/19/2021 1043 Last data filed at 07/19/2021 8119 Gross per 24 hour  Intake --  Output 350 ml  Net -350 ml   Filed Weights   07/18/21 2049  Weight: (!) 142.4 kg    Examination:  General exam: Obese male sitting up in bed, BiPAP on, awake alert oriented x3 HEENT: Neck obese unable to assess JVD CVS: S1-S2, regular rhythm Lungs: Poor air movement bilaterally, diminished breath sounds at both bases Abdomen: Soft, nontender, bowel sounds present Extremities: 2+ edema Skin: No rash on exposed skin Data Reviewed:   CBC: Recent Labs  Lab 07/18/21 2045 07/18/21 2056 07/18/21 2118  WBC 12.6*  --   --   NEUTROABS 10.2*  --   --   HGB 10.8* 12.9* 11.2*  HCT 34.6* 38.0* 33.0*  MCV 75.7*  --   --   PLT 203  --   --    Basic Metabolic Panel: Recent Labs  Lab 07/18/21 2045 07/18/21 2056 07/18/21 2118  NA 133* 134* 133*  K 3.3* 3.3* 3.3*  CL 90* 93*  --   CO2 26  --   --   GLUCOSE 255* 252*  --   BUN 13 15  --   CREATININE 1.04 0.90  --   CALCIUM 9.0  --   --  GFR: Estimated Creatinine Clearance: 134.2 mL/min (by C-G formula based on SCr of 0.9 mg/dL). Liver Function Tests: Recent Labs  Lab 07/18/21 2045  AST 22  ALT 18  ALKPHOS 78  BILITOT 1.1  PROT 6.7  ALBUMIN 3.4*   No results for input(s): LIPASE, AMYLASE in the last 168 hours. No results for input(s): AMMONIA in the last 168 hours. Coagulation Profile: No results for input(s): INR, PROTIME in the last 168 hours. Cardiac Enzymes: No results for input(s): CKTOTAL, CKMB, CKMBINDEX, TROPONINI in the last 168 hours. BNP (last 3 results) No results for input(s): PROBNP in the last 8760 hours. HbA1C: No results for input(s): HGBA1C in the last 72 hours. CBG: Recent Labs   Lab 07/19/21 0610 07/19/21 0829  GLUCAP 355* 324*   Lipid Profile: No results for input(s): CHOL, HDL, LDLCALC, TRIG, CHOLHDL, LDLDIRECT in the last 72 hours. Thyroid Function Tests: No results for input(s): TSH, T4TOTAL, FREET4, T3FREE, THYROIDAB in the last 72 hours. Anemia Panel: No results for input(s): VITAMINB12, FOLATE, FERRITIN, TIBC, IRON, RETICCTPCT in the last 72 hours. Urine analysis: No results found for: COLORURINE, APPEARANCEUR, LABSPEC, PHURINE, GLUCOSEU, HGBUR, BILIRUBINUR, KETONESUR, PROTEINUR, UROBILINOGEN, NITRITE, LEUKOCYTESUR Sepsis Labs: @LABRCNTIP (procalcitonin:4,lacticidven:4)  ) Recent Results (from the past 240 hour(s))  Resp Panel by RT-PCR (Flu A&B, Covid) Nasopharyngeal Swab     Status: None   Collection Time: 07/18/21  8:48 PM   Specimen: Nasopharyngeal Swab; Nasopharyngeal(NP) swabs in vial transport medium  Result Value Ref Range Status   SARS Coronavirus 2 by RT PCR NEGATIVE NEGATIVE Final    Comment: (NOTE) SARS-CoV-2 target nucleic acids are NOT DETECTED.  The SARS-CoV-2 RNA is generally detectable in upper respiratory specimens during the acute phase of infection. The lowest concentration of SARS-CoV-2 viral copies this assay can detect is 138 copies/mL. A negative result does not preclude SARS-Cov-2 infection and should not be used as the sole basis for treatment or other patient management decisions. A negative result may occur with  improper specimen collection/handling, submission of specimen other than nasopharyngeal swab, presence of viral mutation(s) within the areas targeted by this assay, and inadequate number of viral copies(<138 copies/mL). A negative result must be combined with clinical observations, patient history, and epidemiological information. The expected result is Negative.  Fact Sheet for Patients:  EntrepreneurPulse.com.au  Fact Sheet for Healthcare Providers:   IncredibleEmployment.be  This test is no t yet approved or cleared by the [place] FDA and  has been authorized for detection and/or diagnosis of SARS-CoV-2 by FDA under an Emergency Use Authorization (EUA). This EUA will remain  in effect (meaning this test can be used) for the duration of the COVID-19 declaration under Section 564(b)(1) of the Act, 21 U.S.C.section 360bbb-3(b)(1), unless the authorization is terminated  or revoked sooner.       Influenza A by PCR NEGATIVE NEGATIVE Final   Influenza B by PCR NEGATIVE NEGATIVE Final    Comment: (NOTE) The Xpert Xpress SARS-CoV-2/FLU/RSV plus assay is intended as an aid in the diagnosis of influenza from Nasopharyngeal swab specimens and should not be used as a sole basis for treatment. Nasal washings and aspirates are unacceptable for Xpert Xpress SARS-CoV-2/FLU/RSV testing.  Fact Sheet for Patients: EntrepreneurPulse.com.au  Fact Sheet for Healthcare Providers: IncredibleEmployment.be  This test is not yet approved or cleared by the [place] FDA and has been authorized for detection and/or diagnosis of SARS-CoV-2 by FDA under an Emergency Use Authorization (EUA). This EUA will remain in effect (meaning this test can  be used) for the duration of the COVID-19 declaration under Section 564(b)(1) of the Act, 21 U.S.C. section 360bbb-3(b)(1), unless the authorization is terminated or revoked.  Performed at [facility], [place] [address], [place],  [address]          Radiology Studies: CT Angio Chest PE W and/or Wo Contrast  Result Date: 07/19/2021 CLINICAL DATA:  Pulmonary embolism suspected, high probability. EXAM: CT ANGIOGRAPHY CHEST WITH CONTRAST TECHNIQUE: Multidetector CT imaging of the chest was performed using the standard protocol during bolus administration of intravenous contrast. Multiplanar CT image reconstructions and MIPs were obtained  to evaluate the vascular anatomy. CONTRAST:  112mL OMNIPAQUE IOHEXOL 350 MG/ML SOLN COMPARISON:  None. FINDINGS: Cardiovascular: The heart is normal in size and there is no pericardial effusion. Scattered coronary artery calcifications are noted. There is atherosclerotic calcification of the aorta without evidence of aneurysm. Pulmonary trunk is normal in caliber. No large central pulmonary artery filling defect is identified. There is suboptimal opacification and compressive atelectasis evaluation of the segmental and subsegmental arteries. Mediastinum/Nodes: No mediastinal, hilar, or axillary lymphadenopathy. A subcentimeter hypodensity is present in the left lobe of the thyroid gland which is too small to further characterize. The trachea and esophagus are within normal limits. Lungs/Pleura: There are large bilateral pleural effusion with compressive atelectasis bilaterally. No pneumothorax. Upper Abdomen: A rim calcified mass is present in the left upper quadrant inferior to the spleen, only partially visualized on exam. Musculoskeletal: Degenerative changes in the thoracic spine. No acute osseous abnormality. Review of the MIP images confirms the above findings. IMPRESSION: 1. No large central pulmonary artery embolism. There is suboptimal evaluation of the segmental subsegmental arteries. 2. Large bilateral pleural effusions with compressive atelectasis. 3. Coronary artery calcifications. 4. Aortic atherosclerosis. 5. Rim calcified mass in the left upper quadrant of unknown etiology. CT abdomen and pelvis is recommended for further evaluation. Electronically Signed   By: Brett Fairy M.D.   On: 07/19/2021 04:32   DG Chest Portable 1 View  Result Date: 07/19/2021 CLINICAL DATA:  Status post thoracentesis.  Shortness of breath. EXAM: PORTABLE CHEST 1 VIEW COMPARISON:  Chest radiograph 07/18/2021 and chest CTA 07/19/2021 FINDINGS: The cardiomediastinal silhouette is unchanged with normal heart size. There are  persistent right larger than left pleural effusions with associated lower lobe atelectasis. Lung aeration is overall similar to yesterday's chest radiograph. No pneumothorax is identified. IMPRESSION: No pneumothorax. Bilateral pleural effusions and lower lobe atelectasis. Electronically Signed   By: Logan Bores M.D.   On: 07/19/2021 10:36   DG Chest Port 1 View  Result Date: 07/18/2021 CLINICAL DATA:  Shortness of breath EXAM: PORTABLE CHEST 1 VIEW COMPARISON:  07/12/2021 FINDINGS: Bilateral lower lobe airspace opacities and layering effusions. This is stable on the left, worsened on the right since prior study. Heart is borderline in size. No acute bony abnormality. IMPRESSION: Bilateral effusions with lower lobe atelectasis or infiltrates, worsening on the right since prior study. Electronically Signed   By: Rolm Baptise M.D.   On: 07/18/2021 22:14        Scheduled Meds:  enoxaparin (LOVENOX) injection  40 mg Subcutaneous G26R   folic acid  1 mg Oral Daily   furosemide  80 mg Intravenous Q12H   insulin aspart  0-9 Units Subcutaneous Q4H   multivitamin with minerals  1 tablet Oral Daily   potassium chloride  40 mEq Oral BID   thiamine  100 mg Oral Daily   Or   thiamine  100  mg Intravenous Daily   Continuous Infusions:   LOS: 0 days    Time spent: 55min    Domenic Polite, MD [facility]   07/19/2021, 10:43 AM

## 2021-07-19 NOTE — Progress Notes (Signed)
Patient was tried off BiPAP unable to tolerate it c/o shortness of breath per patient. Patient was placed back on NIV w/o complications noted.   Derek Owen L. Tamala Julian, BS, RRT-ACCS, RCP

## 2021-07-19 NOTE — Progress Notes (Signed)
Patient transported to and from CT w/o complications. Patient has signs of orthopnea once in CT scanner.   Conner Muegge L. Tamala Julian, BS, RRT-ACCS, RCP

## 2021-07-19 NOTE — Consult Note (Signed)
NAME:  Derek Owen, MRN:  [id number], DOB:  01/11/1966, LOS: 0 ADMISSION DATE:  07/18/2021, CONSULTATION DATE:  07/19/2021  REFERRING MD:  Jorge Mandril, CHIEF COMPLAINT: Respiratory distress requiring BiPAP with bilateral effusions  History of Present Illness:  56 year old smoker, diabetic, with rheumatoid arthritis, unvaccinated, developed COVID infection 12/23 given a course of paxlovid , went to [facility], ED on 07/12/2021, found to have elevated D-dimer and chest x-ray showing new left pleural effusion, VQ scan was low proper PE, discharged home with steroids and albuterol. Increase shortness of breath and BIBEMS to the ED on 1/8 with respiratory distress and hypoxic to the 80s, required BiPAP emergently, bilateral pedal edema noted, BNP normal given 40 of Lasix with good diuresis .  CT angiogram chest was negative for PE, showed bilateral pleural effusions and left upper abdominal calcified mass.  Labs showed mild leukocytosis, stable anemia, normal BNP and troponin, ABG did not show significant hypercarbia  Pertinent  Medical History  Diabetes type 2 Hypertension Rheumatoid arthritis -on methotrexate, follows with Ignacia Marvel, MD, last evaluation 11/7 was asked to increase methotrexate from 15 to 25 mg but has not done this  Significant Hospital Events: Including procedures, antibiotic start and stop dates in addition to other pertinent events   1/2 VQ scan normal or low probe, no peripheral filling defects   1/9 CT angiogram chest >> no pulm embolism, large bilateral pleural effusions with compressive atelectasis, calcified mass in the left upper quadrant of abdomen  Interim History / Subjective:  Remains short of breath on BiPAP but able to speak in full sentences Tachycardic to 120s  Objective   Blood pressure (!) 148/106, pulse (!) 123, temperature 98.3 F (36.8 C), temperature source Oral, resp. rate (!) 29, height 6' (1.829 m), weight (!) 142.4 kg, SpO2 100 %.    FiO2 (%):  [40  %] 40 %   Intake/Output Summary (Last 24 hours) at 07/19/2021 1040 Last data filed at 07/19/2021 8921 Gross per 24 hour  Intake --  Output 350 ml  Net -350 ml   Filed Weights   07/18/21 2049  Weight: (!) 142.4 kg    Examination: General: Obese man sitting up in ED bed, on BiPAP full facemask, mild distress HENT: No JVD, no pallor, no icterus, short neck Lungs: Decreased breath sounds bilateral, no rhonchi, mild accessory muscle use when taken off BiPAP Cardiovascular: S1-S2 tacky, no murmurs, sinus tach on monitor Abdomen: Obese, nontender Extremities: 2+ bipedal edema, no deformity Neuro: Alert oriented x3, able to speak in full sentences, no focal deficits GU: Clear urine  Resolved Hospital Problem list     Assessment & Plan:  Acute hypoxic respiratory failure requiring BiPAP Etiology of bilateral effusions unclear -on thoracentesis this appeared bloody, hemorrhagic effusions may be due to postinfectious although no clear pneumonia demonstrated on CT, leukocytosis is likely attributed to steroids, could be related to rheumatoid arthritis, he is not on anticoagulation, pulm embolism has been ruled out , no history of trauma  Postthoracentesis chest x-ray shows improved aeration in the left  Recommend -Use BiPAP intermittently as needed for work of breathing, work-up breathing seem to improve significantly after thoracentesis -We will repeat thoracentesis on the right in 4 hours -Await pleural fluid results including glucose, repeat CBC , hold Lovenox -Formal echo evaluation even though BNP is low for new cardiomyopathy  Best Practice (right click and "Reselect all SmartList Selections" daily)   Diet/type: clear liquids DVT prophylaxis: SCD GI prophylaxis: N/A Lines: N/A Foley:  N/A Code Status:  full code Last date of multidisciplinary goals of care discussion [NA]  Labs   CBC: Recent Labs  Lab 07/18/21 2045 07/18/21 2056 07/18/21 2118  WBC 12.6*  --   --    NEUTROABS 10.2*  --   --   HGB 10.8* 12.9* 11.2*  HCT 34.6* 38.0* 33.0*  MCV 75.7*  --   --   PLT 203  --   --     Basic Metabolic Panel: Recent Labs  Lab 07/18/21 2045 07/18/21 2056 07/18/21 2118  NA 133* 134* 133*  K 3.3* 3.3* 3.3*  CL 90* 93*  --   CO2 26  --   --   GLUCOSE 255* 252*  --   BUN 13 15  --   CREATININE 1.04 0.90  --   CALCIUM 9.0  --   --    GFR: Estimated Creatinine Clearance: 134.2 mL/min (by C-G formula based on SCr of 0.9 mg/dL). Recent Labs  Lab 07/18/21 2045 07/19/21 0350 07/19/21 0810  WBC 12.6*  --   --   LATICACIDVEN  --  2.0* 1.9    Liver Function Tests: Recent Labs  Lab 07/18/21 2045  AST 22  ALT 18  ALKPHOS 78  BILITOT 1.1  PROT 6.7  ALBUMIN 3.4*   No results for input(s): LIPASE, AMYLASE in the last 168 hours. No results for input(s): AMMONIA in the last 168 hours.  ABG    Component Value Date/Time   PHART 7.451 (H) 07/18/2021 2118   PCO2ART 44.4 07/18/2021 2118   PO2ART 116 (H) 07/18/2021 2118   HCO3 31.0 (H) 07/18/2021 2118   TCO2 32 07/18/2021 2118   O2SAT 99.0 07/18/2021 2118     Coagulation Profile: No results for input(s): INR, PROTIME in the last 168 hours.  Cardiac Enzymes: No results for input(s): CKTOTAL, CKMB, CKMBINDEX, TROPONINI in the last 168 hours.  HbA1C: HbA1c, POC (controlled diabetic range)  Date/Time Value Ref Range Status  04/26/2021 01:39 PM 8.7 (A) 0.0 - 7.0 % Final   Hgb A1c MFr Bld  Date/Time Value Ref Range Status  11/20/2020 08:47 AM 11.3 (H) 4.8 - 5.6 % Final    Comment:             Prediabetes: 5.7 - 6.4          Diabetes: >6.4          Glycemic control for adults with diabetes: <7.0   06/01/2020 10:44 AM 6.9 (H) 4.8 - 5.6 % Final    Comment:             Prediabetes: 5.7 - 6.4          Diabetes: >6.4          Glycemic control for adults with diabetes: <7.0     CBG: Recent Labs  Lab 07/19/21 0610 07/19/21 0829  GLUCAP 355* 324*    Review of Systems:   Increase  shortness of breath, no cough or hemoptysis Increased bipedal edema   Constitutional: negative for anorexia, fevers and sweats  Eyes: negative for irritation, redness and visual disturbance  Ears, nose, mouth, throat, and face: negative for earaches, epistaxis, nasal congestion and sore throat   Cardiovascular: negative for chest pain, orthopnea, palpitations and syncope  Gastrointestinal: negative for abdominal pain, constipation, diarrhea, melena, nausea and vomiting  Genitourinary:negative for dysuria, frequency and hematuria  Hematologic/lymphatic: negative for bleeding, easy bruising and lymphadenopathy  Musculoskeletal:negative for arthralgias, muscle weakness and stiff joints  Neurological: negative  for coordination problems, gait problems, headaches and weakness  Endocrine: negative for diabetic symptoms including polydipsia, polyuria and weight loss    Past Medical History:  He,  has a past medical history of Alcohol use disorder, mild, abuse, Cigarette smoker, Diabetes mellitus without complication ([place]), Hyperlipidemia, Hypertension, Polycythemia (04/13/2020), and Stroke ([place]).   Surgical History:  History reviewed. No pertinent surgical history.   Social History:   reports that he has been smoking cigarettes. He has been smoking an average of 1 pack per day. He has never used smokeless tobacco. He reports current alcohol use. He reports that he does not currently use drugs.   Family History:  His family history is not on file. He was adopted.   Allergies No Known Allergies   Home Medications  Prior to Admission medications   Medication Sig Start Date End Date Taking? Authorizing Provider  albuterol (VENTOLIN HFA) 108 (90 Base) MCG/ACT inhaler Inhale 1-2 puffs into the lungs every 4 (four) hours as needed for wheezing or shortness of breath. 4/0/98  Yes Campbell Stall P, DO  amLODipine (NORVASC) 10 MG tablet Take 1 tablet (10 mg total) by mouth daily. 06/17/21  Yes  Fayrene Helper, MD  aspirin 81 MG chewable tablet Chew 81 mg by mouth daily.   Yes [provider]  atorvastatin (LIPITOR) 40 MG tablet Take 1 tablet (40 mg total) by mouth daily. 06/17/21  Yes Fayrene Helper, MD  folic acid (FOLVITE) 1 MG tablet Take 1 tablet (1 mg total) by mouth daily. 05/03/21  Yes Rice, Resa Miner, MD  glipiZIDE (GLUCOTROL) 10 MG tablet Take 1 tablet (10 mg total) by mouth daily before breakfast. 06/17/21  Yes Fayrene Helper, MD  guaiFENesin (ROBITUSSIN) 100 MG/5ML liquid Take 5-10 mLs (100-200 mg total) by mouth every 4 (four) hours as needed for cough or to loosen phlegm. 07/11/89  Yes Campbell Stall P, DO  ibuprofen (ADVIL) 600 MG tablet Take 1 tablet (600 mg total) by mouth every 6 (six) hours as needed for mild pain, fever or headache. 10/15/80  Yes Campbell Stall P, DO  losartan-hydrochlorothiazide (HYZAAR) 100-25 MG tablet Take 1 tablet by mouth daily. 06/17/21  Yes Fayrene Helper, MD  methotrexate (RHEUMATREX) 2.5 MG tablet Take 10 tablets (25 mg total) by mouth once a week. Caution:Chemotherapy. Protect from light. Patient taking differently: Take 15 mg by mouth every Friday. 6 tabs 05/17/21  Yes Rice, Resa Miner, MD  Pseudoephedrine-APAP-DM (DAYQUIL PO) Take 30 mLs by mouth 2 (two) times daily as needed (cold/cough).   Yes [provider]  Semaglutide (RYBELSUS) 14 MG TABS Take 14 mg by mouth daily. 04/26/21  Yes Brita Romp, NP  ibuprofen (ADVIL) 800 MG tablet Take 1 tablet (800 mg total) by mouth every 8 (eight) hours as needed. Patient not taking: Reported on 05/17/2021 10/08/20   Noreene Larsson, NP  PAXLOVID, 300/100, 20 x 150 MG & 10 x 100MG  TBPK Take 3 tablets by mouth See admin instructions. Tid x 5 days Patient not taking: Reported on 07/19/2021 07/05/21   [provider]  predniSONE (DELTASONE) 10 MG tablet Take 10 mg by mouth See admin instructions. 40 qd x 2 days, 30 qd x 2 days, 20 mg qd x 2 days, 10 mg  Qd x 2  days,then stop Patient not taking: Reported on 07/19/2021 07/02/21   [provider]     Critical care time: [place] MD. [facility]. [place] [facility]  Critical care Pager : [phone]  If no response to pager , please call [phone] until 7 pm After 7:00 pm call Elink  [phone]   07/19/2021

## 2021-07-19 NOTE — ED Provider Notes (Signed)
°  Provider Note MRN:  [id number]  Arrival date & time: 07/19/21    ED Course and Medical Decision Making  Assumed care from Dr Darl Householder at shift change.  See not from prior team for complete details, in brief: 56 year old male history of hypertension, to the ER with respiratory difficulty.  Diagnosed COVID-19 2 weeks ago.  Completed Paxlovid.  Seen at [facility], elevated D-dimer, CT scanner nonfunctional, received VQ scan which was negative.  Discharged home.  Ongoing symptoms.  Treated for COPD exacerbation.  Worsening dyspnea, hypoxic prior to arrival.  Improved on nonrebreather, now requiring BiPAP.  Plan per prior physician CTA, admit  CTA completed, large bilateral pleural effusions.  Continue BiPAP.  Patient becomes severely dyspneic with minimal ambulation or removal BiPAP. Give Lasix.  Will likely require b/l thoracentesis to better manage large pleural effusions.   Added broad-spectrum antibiotics for possible pneumonia.  COPD exacerbation is also on differential.  Abnormal lesion to abdomen noted on CT chest, CT abdomen pelvis ordered, will defer to hospitalist regarding remaining work-up of this abnormality.  Recommend admission, D/w Dr Alcario Drought who accepts pt for admission.   .Critical Care Performed by: Jeanell Sparrow, DO Authorized by: Jeanell Sparrow, DO   Critical care provider statement:    Critical care time (minutes):  34   Critical care time was exclusive of:  Separately billable procedures and treating other patients   Critical care was necessary to treat or prevent imminent or life-threatening deterioration of the following conditions:  Respiratory failure   Critical care was time spent personally by me on the following activities:  Development of treatment plan with patient or surrogate, discussions with consultants, evaluation of patient's response to treatment, examination of patient, ordering and review of laboratory studies, ordering and review of radiographic  studies, ordering and performing treatments and interventions, pulse oximetry, re-evaluation of patient's condition and review of old charts   Care discussed with: admitting provider    Final Clinical Impressions(s) / ED Diagnoses     ICD-10-CM   1. Respiratory distress  R06.03     2. Acute respiratory failure with hypoxia (HCC)  J96.01     3. Bilateral pleural effusion  J90     4. COPD exacerbation ([place])  J44.1     5. Community acquired pneumonia, unspecified laterality  J18.9     6. Abnormal CT of the chest  R93.89       ED Discharge Orders     None       Discharge Instructions   None         Jeanell Sparrow, DO 07/19/21 [phone]

## 2021-07-19 NOTE — ED Notes (Signed)
Pt resting comfortably in bed. Moved pt to 6L [place] per MD request 1 hour post thoracentesis to see how pt tolerates. Pt has no complaints. Plan of care continues.

## 2021-07-19 NOTE — ED Notes (Signed)
Pt up to use urinal with assitance

## 2021-07-20 ENCOUNTER — Inpatient Hospital Stay (HOSPITAL_COMMUNITY): Payer: Managed Care, Other (non HMO)

## 2021-07-20 ENCOUNTER — Other Ambulatory Visit: Payer: Self-pay

## 2021-07-20 DIAGNOSIS — J90 Pleural effusion, not elsewhere classified: Secondary | ICD-10-CM | POA: Diagnosis not present

## 2021-07-20 DIAGNOSIS — J9601 Acute respiratory failure with hypoxia: Secondary | ICD-10-CM | POA: Diagnosis not present

## 2021-07-20 LAB — TRIGLYCERIDES, BODY FLUIDS: Triglycerides, Fluid: 69 mg/dL

## 2021-07-20 LAB — BASIC METABOLIC PANEL
Anion gap: 9 (ref 5–15)
BUN: 20 mg/dL (ref 6–20)
CO2: 29 mmol/L (ref 22–32)
Calcium: 8.3 mg/dL — ABNORMAL LOW (ref 8.9–10.3)
Chloride: 97 mmol/L — ABNORMAL LOW (ref 98–111)
Creatinine, Ser: 1.25 mg/dL — ABNORMAL HIGH (ref 0.61–1.24)
GFR, Estimated: >60 mL/min (ref >60)
Glucose, Bld: 226 mg/dL — ABNORMAL HIGH (ref 70–99)
Potassium: 4.3 mmol/L (ref 3.5–5.1)
Sodium: 135 mmol/L (ref 135–145)

## 2021-07-20 LAB — IRON AND TIBC
Iron: 26 ug/dL — ABNORMAL LOW (ref 45–182)
Saturation Ratios: 8 % — ABNORMAL LOW (ref 17.9–39.5)
TIBC: 318 ug/dL (ref 250–450)
UIBC: 292 ug/dL

## 2021-07-20 LAB — CBC
HCT: 28.4 % — ABNORMAL LOW (ref 39.0–52.0)
Hemoglobin: 8.9 g/dL — ABNORMAL LOW (ref 13.0–17.0)
MCH: 24.1 pg — ABNORMAL LOW (ref 26.0–34.0)
MCHC: 31.3 g/dL (ref 30.0–36.0)
MCV: 76.8 fL — ABNORMAL LOW (ref 80.0–100.0)
Platelets: 203 10*3/uL (ref 150–400)
RBC: 3.7 MIL/uL — ABNORMAL LOW (ref 4.22–5.81)
RDW: 18.5 % — ABNORMAL HIGH (ref 11.5–15.5)
WBC: 17.9 10*3/uL — ABNORMAL HIGH (ref 4.0–10.5)
nRBC: 0 % (ref 0.0–0.2)

## 2021-07-20 LAB — CBG MONITORING, ED
Glucose-Capillary: 231 mg/dL — ABNORMAL HIGH (ref 70–99)
Glucose-Capillary: 291 mg/dL — ABNORMAL HIGH (ref 70–99)
Glucose-Capillary: 306 mg/dL — ABNORMAL HIGH (ref 70–99)

## 2021-07-20 LAB — RETICULOCYTES
Immature Retic Fract: 35.9 % — ABNORMAL HIGH (ref 2.3–15.9)
RBC.: 3.77 MIL/uL — ABNORMAL LOW (ref 4.22–5.81)
Retic Count, Absolute: 90.9 10*3/uL (ref 19.0–186.0)
Retic Ct Pct: 2.4 % (ref 0.4–3.1)

## 2021-07-20 LAB — FOLATE: Folate: 21 ng/mL (ref >5.9)

## 2021-07-20 LAB — RAPID URINE DRUG SCREEN, HOSP PERFORMED
Amphetamines: NOT DETECTED
Barbiturates: NOT DETECTED
Benzodiazepines: NOT DETECTED
Cocaine: NOT DETECTED
Opiates: NOT DETECTED
Tetrahydrocannabinol: NOT DETECTED

## 2021-07-20 LAB — AMYLASE, PLEURAL OR PERITONEAL FLUID: Amylase, Fluid: 26 U/L

## 2021-07-20 LAB — CYTOLOGY - NON PAP

## 2021-07-20 LAB — GLUCOSE, CAPILLARY
Glucose-Capillary: 244 mg/dL — ABNORMAL HIGH (ref 70–99)
Glucose-Capillary: 245 mg/dL — ABNORMAL HIGH (ref 70–99)
Glucose-Capillary: 303 mg/dL — ABNORMAL HIGH (ref 70–99)

## 2021-07-20 LAB — MRSA NEXT GEN BY PCR, NASAL: MRSA by PCR Next Gen: NOT DETECTED

## 2021-07-20 LAB — FERRITIN: Ferritin: 277 ng/mL (ref 24–336)

## 2021-07-20 LAB — VITAMIN B12: Vitamin B-12: 333 pg/mL (ref 180–914)

## 2021-07-20 MED ORDER — FUROSEMIDE 10 MG/ML IJ SOLN
40.0000 mg | Freq: Two times a day (BID) | INTRAMUSCULAR | Status: DC
  Administered 2021-07-20: 40 mg via INTRAVENOUS
  Filled 2021-07-20: qty 4

## 2021-07-20 MED ORDER — INSULIN ASPART 100 UNIT/ML IJ SOLN
3.0000 [IU] | Freq: Three times a day (TID) | INTRAMUSCULAR | Status: DC
  Administered 2021-07-20 – 2021-07-22 (×6): 3 [IU] via SUBCUTANEOUS

## 2021-07-20 MED ORDER — INSULIN GLARGINE-YFGN 100 UNIT/ML ~~LOC~~ SOLN
25.0000 [IU] | Freq: Every day | SUBCUTANEOUS | Status: DC
  Administered 2021-07-20 – 2021-07-22 (×3): 25 [IU] via SUBCUTANEOUS
  Filled 2021-07-20 (×3): qty 0.25

## 2021-07-20 MED ORDER — ORAL CARE MOUTH RINSE
15.0000 mL | Freq: Two times a day (BID) | OROMUCOSAL | Status: DC
  Administered 2021-07-21 – 2021-07-28 (×7): 15 mL via OROMUCOSAL

## 2021-07-20 NOTE — TOC Progression Note (Signed)
Transition of Care [facility]) - Progression Note    Patient Details  Name: Derek Owen MRN: [id number] Date of Birth: [birth year]  Transition of Care [facility]) CM/SW [place], RN Phone Number:[phone]  07/20/2021, 3:14 PM  Clinical Narrative:     Transition of Care [facility]) Screening Note   Patient Details  Name: Derek Owen Date of Birth: April 02, 1966   Transition of Care [facility]) CM/SW Contact:    Angelita Ingles, RN Phone Number: 07/20/2021, 3:15 PM    Transition of Care Department [facility]) has reviewed patient. Patient needs CAGE aid TOC will follow and document.          Expected Discharge Plan and Services                                                 Social Determinants of Health (SDOH) Interventions    Readmission Risk Interventions No flowsheet data found.

## 2021-07-20 NOTE — ED Notes (Signed)
Sat patient up to eat breakfast patient is sitting eating with call bell in reach

## 2021-07-20 NOTE — Progress Notes (Addendum)
PROGRESS NOTE    Derek Owen  BXU:[id number] DOB: [birth year] DOA: 07/18/2021 PCP: Renee Rival, FNP  Brief Narrative: 55/M with history of type 2 diabetes mellitus, alcohol abuse, hypertension presented to the ED with respiratory distress, he was diagnosed with COVID on 12/23, treated with a course of Paxlovid, then presented to [facility] ED on 1/2 with shortness of breath, had a VQ scan which was low probability, discharged home on steroids and albuterol.  Continue to have progressive dyspnea on exertion and presented to the ED with significantly worsening dyspnea, lower extremity edema. -In the emergency room he had a CTA chest which showed no PE, large bilateral pleural effusions,?  Partially visualized LUQ mass -Pulmonary consulted, underwent bilateral thoracentesis, fluid is bloody, etiology unclear  Assessment & Plan:   Acute respiratory failure with hypoxia (HCC) Large bloody bilateral pleural effusions -Required rescue BiPAP yesterday -Improving, underwent left and right thoracentesis yesterday, 600 and 1500 mL drained -Fluid was bloody, exudative, follow-up cultures, cytology, QuantiFERON -Rheumatoid arthritis is a possibility, long history of RA, takes weekly methotrexate -Also has 2+ lower extremity edema, continue IV Lasix today -2D echo with preserved EF, asymmetric LVH -Do not suspect bacterial infection, agree with stopping antibiotics, Pro-Cal low -Labs in a.m.  ?  Partially visualized LUQ mass -Noted on CT chest -Will obtain CT abdomen pelvis with IV contrast when respiratory status and kidney function more stable  Microcytic anemia -Check anemia panel, continue to trend  Type 2 diabetes mellitus, uncontrolled with hyperglycemia -Oral hypoglycemics on hold -CBGs high in the 300s, started on Semglee, increased dose -fu hemoglobin A1c  Hypertension -Antihypertensives on hold  History of alcohol abuse -Thiamine, monitor on CIWA  protocol  Hypokalemia -Replaced   DVT prophylaxis: SCDs Code Status: Full code Family Communication: Discussed patient in detail, no family at bedside Disposition Plan:  Status is: Inpatient  Remains inpatient appropriate because: Severity of illness    Consultants:  PCCM  Procedures: Left thoracentesis 1/9, 600 mL of bloody fluid drained Right thoracentesis 1/9 1500 mL of bloody fluid drained  Antimicrobials:    Subjective: -Feels better, some shortness of breath on exertion but 90% better  Objective: Vitals:   07/20/21 1000 07/20/21 1100 07/20/21 1131 07/20/21 1211  BP: 130/82 126/79  (!) 133/95  Pulse: 98 (!) 109  (!) 110  Resp: 19 19  15   Temp:    98 F (36.7 C)  TempSrc:    Oral  SpO2: 100% 97% 97% 97%  Weight:      Height:        Intake/Output Summary (Last 24 hours) at 07/20/2021 1406 Last data filed at 07/20/2021 0854 Gross per 24 hour  Intake --  Output 950 ml  Net -950 ml   Filed Weights   07/18/21 2049  Weight: (!) 142.4 kg    Examination:  General exam: Obese chronically ill male sitting up in bed, AAOx3, no distress HEENT: Neck obese unable to assess JVD CVS: S1-S2, regular rhythm Lungs: Decreased breath sounds bilaterally Abdomen: Soft, nontender, bowel sounds present Extremities: 2+ edema  Skin: No rash on exposed skin Data Reviewed:   CBC: Recent Labs  Lab 07/18/21 2045 07/18/21 2056 07/18/21 2118 07/19/21 1212 07/20/21 0500  WBC 12.6*  --   --  10.9* 17.9*  NEUTROABS 10.2*  --   --   --   --   HGB 10.8* 12.9* 11.2* 9.7* 8.9*  HCT 34.6* 38.0* 33.0* 31.5* 28.4*  MCV 75.7*  --   --  75.7* 76.8*  PLT 203  --   --  204 425   Basic Metabolic Panel: Recent Labs  Lab 07/18/21 2045 07/18/21 2056 07/18/21 2118 07/19/21 1212 07/20/21 0500  NA 133* 134* 133* 132* 135  K 3.3* 3.3* 3.3* 4.1 4.3  CL 90* 93*  --  95* 97*  CO2 26  --   --  27 29  GLUCOSE 255* 252*  --  284* 226*  BUN 13 15  --  14 20  CREATININE 1.04 0.90  --   1.06 1.25*  CALCIUM 9.0  --   --  8.1* 8.3*   GFR: Estimated Creatinine Clearance: 96.6 mL/min (A) (by C-G formula based on SCr of 1.25 mg/dL (H)). Liver Function Tests: Recent Labs  Lab 07/18/21 2045 07/19/21 1212  AST 22  --   ALT 18  --   ALKPHOS 78  --   BILITOT 1.1  --   PROT 6.7 6.4*  ALBUMIN 3.4*  --    No results for input(s): LIPASE, AMYLASE in the last 168 hours. No results for input(s): AMMONIA in the last 168 hours. Coagulation Profile: No results for input(s): INR, PROTIME in the last 168 hours. Cardiac Enzymes: No results for input(s): CKTOTAL, CKMB, CKMBINDEX, TROPONINI in the last 168 hours. BNP (last 3 results) No results for input(s): PROBNP in the last 8760 hours. HbA1C: Recent Labs    07/19/21 0810  HGBA1C 8.0*   CBG: Recent Labs  Lab 07/19/21 2016 07/19/21 2345 07/20/21 0430 07/20/21 0723 07/20/21 1138  GLUCAP 321* 274* 231* 291* 306*   Lipid Profile: No results for input(s): CHOL, HDL, LDLCALC, TRIG, CHOLHDL, LDLDIRECT in the last 72 hours. Thyroid Function Tests: No results for input(s): TSH, T4TOTAL, FREET4, T3FREE, THYROIDAB in the last 72 hours. Anemia Panel: No results for input(s): VITAMINB12, FOLATE, FERRITIN, TIBC, IRON, RETICCTPCT in the last 72 hours. Urine analysis: No results found for: COLORURINE, APPEARANCEUR, LABSPEC, PHURINE, GLUCOSEU, HGBUR, BILIRUBINUR, KETONESUR, PROTEINUR, UROBILINOGEN, NITRITE, LEUKOCYTESUR Sepsis Labs: @LABRCNTIP (procalcitonin:4,lacticidven:4)  ) Recent Results (from the past 240 hour(s))  Resp Panel by RT-PCR (Flu A&B, Covid) Nasopharyngeal Swab     Status: None   Collection Time: 07/18/21  8:48 PM   Specimen: Nasopharyngeal Swab; Nasopharyngeal(NP) swabs in vial transport medium  Result Value Ref Range Status   SARS Coronavirus 2 by RT PCR NEGATIVE NEGATIVE Final    Comment: (NOTE) SARS-CoV-2 target nucleic acids are NOT DETECTED.  The SARS-CoV-2 RNA is generally detectable in upper  respiratory specimens during the acute phase of infection. The lowest concentration of SARS-CoV-2 viral copies this assay can detect is 138 copies/mL. A negative result does not preclude SARS-Cov-2 infection and should not be used as the sole basis for treatment or other patient management decisions. A negative result may occur with  improper specimen collection/handling, submission of specimen other than nasopharyngeal swab, presence of viral mutation(s) within the areas targeted by this assay, and inadequate number of viral copies(<138 copies/mL). A negative result must be combined with clinical observations, patient history, and epidemiological information. The expected result is Negative.  Fact Sheet for Patients:  EntrepreneurPulse.com.au  Fact Sheet for Healthcare Providers:  IncredibleEmployment.be  This test is no t yet approved or cleared by the [place] FDA and  has been authorized for detection and/or diagnosis of SARS-CoV-2 by FDA under an Emergency Use Authorization (EUA). This EUA will remain  in effect (meaning this test can be used) for the duration of the COVID-19 declaration under Section 564(b)(1) of the  Act, 21 U.S.C.section 360bbb-3(b)(1), unless the authorization is terminated  or revoked sooner.       Influenza A by PCR NEGATIVE NEGATIVE Final   Influenza B by PCR NEGATIVE NEGATIVE Final    Comment: (NOTE) The Xpert Xpress SARS-CoV-2/FLU/RSV plus assay is intended as an aid in the diagnosis of influenza from Nasopharyngeal swab specimens and should not be used as a sole basis for treatment. Nasal washings and aspirates are unacceptable for Xpert Xpress SARS-CoV-2/FLU/RSV testing.  Fact Sheet for Patients: EntrepreneurPulse.com.au  Fact Sheet for Healthcare Providers: IncredibleEmployment.be  This test is not yet approved or cleared by the [place] FDA and has been  authorized for detection and/or diagnosis of SARS-CoV-2 by FDA under an Emergency Use Authorization (EUA). This EUA will remain in effect (meaning this test can be used) for the duration of the COVID-19 declaration under Section 564(b)(1) of the Act, 21 U.S.C. section 360bbb-3(b)(1), unless the authorization is terminated or revoked.  Performed at [facility], [place] [address], [place], [place] [address]   Body fluid culture w Gram Stain     Status: None (Preliminary result)   Collection Time: 07/19/21 11:02 AM   Specimen: Pleural Fluid  Result Value Ref Range Status   Specimen Description PLEURAL FLUID  Final   Special Requests LEFT  Final   Gram Stain   Final    ABUNDANT WBC PRESENT,BOTH PMN AND MONONUCLEAR NO ORGANISMS SEEN    Culture   Final    NO GROWTH < 24 HOURS Performed at [facility], [place] [address], [place], [place] [address]    Report Status PENDING  Incomplete  Body fluid culture w Gram Stain     Status: None (Preliminary result)   Collection Time: 07/19/21  3:25 PM   Specimen: Pleural Fluid  Result Value Ref Range Status   Specimen Description FLUID PLEURAL  Final   Special Requests NONE  Final   Gram Stain   Final    RARE WBC PRESENT,BOTH PMN AND MONONUCLEAR NO ORGANISMS SEEN    Culture   Final    NO GROWTH < 24 HOURS Performed at [facility], [place] [address], [place], [place] [address]    Report Status PENDING  Incomplete         Radiology Studies: CT Angio Chest PE W and/or Wo Contrast  Result Date: 07/19/2021 CLINICAL DATA:  Pulmonary embolism suspected, high probability. EXAM: CT ANGIOGRAPHY CHEST WITH CONTRAST TECHNIQUE: Multidetector CT imaging of the chest was performed using the standard protocol during bolus administration of intravenous contrast. Multiplanar CT image reconstructions and MIPs were obtained to evaluate the vascular anatomy. CONTRAST:  133mL OMNIPAQUE IOHEXOL 350 MG/ML SOLN COMPARISON:  None. FINDINGS:  Cardiovascular: The heart is normal in size and there is no pericardial effusion. Scattered coronary artery calcifications are noted. There is atherosclerotic calcification of the aorta without evidence of aneurysm. Pulmonary trunk is normal in caliber. No large central pulmonary artery filling defect is identified. There is suboptimal opacification and compressive atelectasis evaluation of the segmental and subsegmental arteries. Mediastinum/Nodes: No mediastinal, hilar, or axillary lymphadenopathy. A subcentimeter hypodensity is present in the left lobe of the thyroid gland which is too small to further characterize. The trachea and esophagus are within normal limits. Lungs/Pleura: There are large bilateral pleural effusion with compressive atelectasis bilaterally. No pneumothorax. Upper Abdomen: A rim calcified mass is present in the left upper quadrant inferior to the spleen, only partially visualized on exam. Musculoskeletal: Degenerative changes in  the thoracic spine. No acute osseous abnormality. Review of the MIP images confirms the above findings. IMPRESSION: 1. No large central pulmonary artery embolism. There is suboptimal evaluation of the segmental subsegmental arteries. 2. Large bilateral pleural effusions with compressive atelectasis. 3. Coronary artery calcifications. 4. Aortic atherosclerosis. 5. Rim calcified mass in the left upper quadrant of unknown etiology. CT abdomen and pelvis is recommended for further evaluation. Electronically Signed   By: Brett Fairy M.D.   On: 07/19/2021 04:32   DG CHEST PORT 1 VIEW  Result Date: 07/20/2021 CLINICAL DATA:  56 year old male with history of shortness of breath. Status post thoracentesis. EXAM: PORTABLE CHEST 1 VIEW COMPARISON:  Chest x-ray 07/20/2021. FINDINGS: Persistent moderate to large right pleural effusion appears slightly decreased compared to the prior study. No definite pneumothorax. Opacity in the right lung base, favored to predominantly  reflect areas of passive subsegmental atelectasis although underlying airspace consolidation is not excluded. Left lung is clear. No evidence of pulmonary edema. Heart size is normal. Upper mediastinal contours are within normal limits. IMPRESSION: 1. Slightly decreased size of moderate to large right pleural effusion following thoracentesis. No postprocedural pneumothorax. Persistent passive atelectasis in the right lung base. Electronically Signed   By: Vinnie Langton M.D.   On: 07/20/2021 12:20   DG Chest Port 1 View  Result Date: 07/20/2021 CLINICAL DATA:  Respiratory distress and pleural effusions. EXAM: PORTABLE CHEST 1 VIEW COMPARISON:  Portable chest today at 15:14, chest CT today at 02:43. FINDINGS: 4:15 a.m., 07/20/2021. The cardiac size is normal. The central vessels are borderline prominent without flow cephalization and no edema is seen. Moderate/large right and moderate left pleural effusions continue to be seen with overlying opacities consistent with atelectasis or consolidation, greater on the right. The left mid and both upper lung fields remain clear. No new or worsening lung opacity is seen. The pleural effusions seem unchanged. IMPRESSION: Right-greater-than-left pleural effusions with overlying atelectasis or consolidation. Overall aeration seems unchanged. Electronically Signed   By: Telford Nab M.D.   On: 07/20/2021 05:08   DG Chest Portable 1 View  Result Date: 07/19/2021 CLINICAL DATA:  thoracentesis EXAM: PORTABLE CHEST 1 VIEW COMPARISON:  Chest x-ray 07/19/2021. FINDINGS: The heart and mediastinal contours are unchanged. Bibasilar vague airspace opacity. No pulmonary edema. Bilateral trace to small bilateral effusions. No pneumothorax. No acute osseous abnormality. IMPRESSION: Bilateral trace to small bilateral effusions. Associated bibasilar vague airspace opacities likely representing atelectasis. Superimposed infection/inflammation not excluded. Electronically Signed   By:  Iven Finn M.D.   On: 07/19/2021 15:28   DG Chest Portable 1 View  Result Date: 07/19/2021 CLINICAL DATA:  Status post thoracentesis.  Shortness of breath. EXAM: PORTABLE CHEST 1 VIEW COMPARISON:  Chest radiograph 07/18/2021 and chest CTA 07/19/2021 FINDINGS: The cardiomediastinal silhouette is unchanged with normal heart size. There are persistent right larger than left pleural effusions with associated lower lobe atelectasis. Lung aeration is overall similar to yesterday's chest radiograph. No pneumothorax is identified. IMPRESSION: No pneumothorax. Bilateral pleural effusions and lower lobe atelectasis. Electronically Signed   By: Logan Bores M.D.   On: 07/19/2021 10:36   DG Chest Port 1 View  Result Date: 07/18/2021 CLINICAL DATA:  Shortness of breath EXAM: PORTABLE CHEST 1 VIEW COMPARISON:  07/12/2021 FINDINGS: Bilateral lower lobe airspace opacities and layering effusions. This is stable on the left, worsened on the right since prior study. Heart is borderline in size. No acute bony abnormality. IMPRESSION: Bilateral effusions with lower lobe atelectasis or infiltrates, worsening on  the right since prior study. Electronically Signed   By: Rolm Baptise M.D.   On: 07/18/2021 22:14   ECHOCARDIOGRAM COMPLETE  Result Date: 07/19/2021    ECHOCARDIOGRAM REPORT   Patient Name:   Derek Owen Date of Exam: 07/19/2021 Medical Rec #:  [id number]       Height:       72.0 in Accession #:    [id number]      Weight:       314.0 lb Date of Birth:  April 01, 1966       BSA:          2.581 m Patient Age:    18 years        BP:           126/89 mmHg Patient Gender: M               HR:           116 bpm. Exam Location:  Inpatient Procedure: 2D Echo, Cardiac Doppler, Color Doppler and Intracardiac            Opacification Agent Indications:    CHF  History:        Patient has no prior history of Echocardiogram examinations.                 Risk Factors:Hypertension and Diabetes.  Sonographer:    Jyl Heinz Referring  Phys: [place]  1. Left ventricular ejection fraction, by estimation, is 55 to 60%. The left ventricle has normal function. The left ventricle has no regional wall motion abnormalities. There is moderate asymmetric left ventricular hypertrophy of the basal-septal segment. Left ventricular diastolic parameters are indeterminate.  2. Right ventricular systolic function is normal. The right ventricular size is normal. Tricuspid regurgitation signal is inadequate for assessing PA pressure.  3. The mitral valve is normal in structure. No evidence of mitral valve regurgitation.  4. The aortic valve was not well visualized. Aortic valve regurgitation is not visualized. No aortic stenosis is present. FINDINGS  Left Ventricle: Left ventricular ejection fraction, by estimation, is 55 to 60%. The left ventricle has normal function. The left ventricle has no regional wall motion abnormalities. The left ventricular internal cavity size was small. There is moderate  asymmetric left ventricular hypertrophy of the basal-septal segment. Left ventricular diastolic parameters are indeterminate. Right Ventricle: The right ventricular size is normal. Right vetricular wall thickness was not well visualized. Right ventricular systolic function is normal. Tricuspid regurgitation signal is inadequate for assessing PA pressure. Left Atrium: Left atrial size was not well visualized. Right Atrium: Right atrial size was not well visualized. Pericardium: There is no evidence of pericardial effusion. Mitral Valve: The mitral valve is normal in structure. No evidence of mitral valve regurgitation. Tricuspid Valve: The tricuspid valve is normal in structure. Tricuspid valve regurgitation is trivial. Aortic Valve: The aortic valve was not well visualized. Aortic valve regurgitation is not visualized. No aortic stenosis is present. Aortic valve peak gradient measures 5.3 mmHg. Pulmonic Valve: The pulmonic valve was not well  visualized. Pulmonic valve regurgitation is not visualized. Aorta: The aortic root and ascending aorta are structurally normal, with no evidence of dilitation. IAS/Shunts: The interatrial septum was not well visualized.  LEFT VENTRICLE PLAX 2D LVIDd:         3.70 cm     Diastology LVIDs:         2.60 cm     LV e' medial:    7.62  cm/s LV PW:         1.20 cm     LV E/e' medial:  6.2 LV IVS:        1.70 cm     LV e' lateral:   7.72 cm/s LVOT diam:     2.20 cm     LV E/e' lateral: 6.2 LV SV:         34 LV SV Index:   13 LVOT Area:     3.80 cm  LV Volumes (MOD) LV vol d, MOD A2C: 89.9 ml LV vol d, MOD A4C: 67.0 ml LV vol s, MOD A2C: 40.5 ml LV vol s, MOD A4C: 28.5 ml LV SV MOD A2C:     49.4 ml LV SV MOD A4C:     67.0 ml LV SV MOD BP:      47.7 ml RIGHT VENTRICLE RV Basal diam:  3.70 cm RV Mid diam:    3.20 cm RV S prime:     16.40 cm/s TAPSE (M-mode): 1.7 cm LEFT ATRIUM         Index       RIGHT ATRIUM           Index LA diam:    2.30 cm 0.89 cm/m  RA Area:     10.80 cm                                 RA Volume:   22.60 ml  8.76 ml/m  AORTIC VALVE AV Area (Vmax): 2.61 cm AV Vmax:        115.00 cm/s AV Peak Grad:   5.3 mmHg LVOT Vmax:      79.00 cm/s LVOT Vmean:     52.100 cm/s LVOT VTI:       0.089 m  AORTA Ao Root diam: 3.30 cm Ao Asc diam:  3.30 cm MITRAL VALVE MV Area (PHT): 5.23 cm    SHUNTS MV Decel Time: 145 msec    Systemic VTI:  0.09 m MV E velocity: 47.60 cm/s  Systemic Diam: 2.20 cm MV A velocity: 72.40 cm/s MV E/A ratio:  0.66 Oswaldo Milian MD Electronically signed by Oswaldo Milian MD Signature Date/Time: 07/19/2021/4:04:25 PM    Final         Scheduled Meds:  folic acid  1 mg Oral Daily   furosemide  40 mg Intravenous Q12H   insulin aspart  0-9 Units Subcutaneous Q4H   insulin aspart  3 Units Subcutaneous TID WC   insulin glargine-yfgn  25 Units Subcutaneous Daily   multivitamin with minerals  1 tablet Oral Daily   thiamine  100 mg Oral Daily   Or   thiamine  100 mg  Intravenous Daily   Continuous Infusions:   LOS: 1 day    Time spent: 95min    Domenic Polite, MD [facility]   07/20/2021, 2:06 PM

## 2021-07-20 NOTE — ED Notes (Signed)
Consent for bedside thoracentesis obtained.

## 2021-07-20 NOTE — Progress Notes (Signed)
NAME:  Derek Owen, MRN:  [id number], DOB:  [birth year], LOS: 1 ADMISSION DATE:  07/18/2021, CONSULTATION DATE:  07/20/2021  REFERRING MD:  Jorge Mandril, CHIEF COMPLAINT: Respiratory distress requiring BiPAP with bilateral effusions  History of Present Illness:  56 year old smoker, diabetic, with rheumatoid arthritis, unvaccinated, developed COVID infection 12/23 given a course of paxlovid , went to [facility], ED on 07/12/2021, found to have elevated D-dimer and chest x-ray showing new left pleural effusion, VQ scan was low proper PE, discharged home with steroids and albuterol. Increase shortness of breath and BIBEMS to the ED on 1/8 with respiratory distress and hypoxic to the 80s, required BiPAP emergently, bilateral pedal edema noted, BNP normal given 40 of Lasix with good diuresis .  CT angiogram chest was negative for PE, showed bilateral pleural effusions and left upper abdominal calcified mass.  Labs showed mild leukocytosis, stable anemia, normal BNP and troponin, ABG did not show significant hypercarbia  Pertinent  Medical History  Diabetes type 2 Hypertension Rheumatoid arthritis -on methotrexate, follows with Ignacia Marvel, MD, last evaluation 11/7 was asked to increase methotrexate from 15 to 25 mg but has not done this  Significant Hospital Events: Including procedures, antibiotic start and stop dates in addition to other pertinent events   1/2 VQ scan normal or low probe, no peripheral filling defects   1/9 CT angiogram chest >> no pulm embolism, large bilateral pleural effusions with compressive atelectasis, calcified mass in the left upper quadrant of abdomen 2d echo 07/20/2021 with EF 55 to 60% with some left ventricular wall a.m. labs  Interim History / Subjective:  Reports breathing better not currently on BiPAP  Objective   Blood pressure 120/75, pulse (!) 102, temperature 97.9 F (36.6 C), temperature source Oral, resp. rate 16, height 6' (1.829 m), weight (!) 142.4 kg,  SpO2 100 %.        Intake/Output Summary (Last 24 hours) at 07/20/2021 0908 Last data filed at 07/20/2021 0854 Gross per 24 hour  Intake --  Output 950 ml  Net -950 ml   Filed Weights   07/18/21 2049  Weight: (!) 142.4 kg    Examination: General: Morbid obese male no acute distress at rest HEENT: MM pink/moist, no JVD or lymphadenopathy is appreciated Neuro: Grossly intact without focal defect boisterous nature CV: Heart sounds are regular PULM: Decreased breath sounds throughout GI: soft, bsx4 active obese soft positive bowel sounds GU: Yellow urine Extremities: warm/dry, 3+ edema  Skin: no rashes or lesions    Resolved Hospital Problem list     Assessment & Plan:  Acute hypoxic respiratory failure requiring BiPAP Etiology of bilateral effusions unclear -on thoracentesis this appeared bloody, hemorrhagic effusions may be due to postinfectious although no clear pneumonia demonstrated on CT, leukocytosis is likely attributed to steroids, could be related to rheumatoid arthritis, he is not on anticoagulation, pulm embolism has been ruled out , no history of trauma    Plan: Aggressive diuresis he reports being noncompliant with diuretics Plus or minus repeat thoracentesis on the left versus monitor repeat chest x-ray in a.m. after aggressive diuresis per MD Not requiring BiPAP at this time Monitor pleural fluid findings 2D echo with EF of 55 to 60% with some left ventricular wall dysfunction Repeat thora left and tap till dry      Best Practice (right click and "Reselect all SmartList Selections" daily)   Per primary  Labs   CBC: Recent Labs  Lab 07/18/21 2045 07/18/21 2056 07/18/21 2118 07/19/21 1212  07/20/21 0500  WBC 12.6*  --   --  10.9* 17.9*  NEUTROABS 10.2*  --   --   --   --   HGB 10.8* 12.9* 11.2* 9.7* 8.9*  HCT 34.6* 38.0* 33.0* 31.5* 28.4*  MCV 75.7*  --   --  75.7* 76.8*  PLT 203  --   --  204 734    Basic Metabolic Panel: Recent Labs   Lab 07/18/21 2045 07/18/21 2056 07/18/21 2118 07/19/21 1212 07/20/21 0500  NA 133* 134* 133* 132* 135  K 3.3* 3.3* 3.3* 4.1 4.3  CL 90* 93*  --  95* 97*  CO2 26  --   --  27 29  GLUCOSE 255* 252*  --  284* 226*  BUN 13 15  --  14 20  CREATININE 1.04 0.90  --  1.06 1.25*  CALCIUM 9.0  --   --  8.1* 8.3*   GFR: Estimated Creatinine Clearance: 96.6 mL/min (A) (by C-G formula based on SCr of 1.25 mg/dL (H)). Recent Labs  Lab 07/18/21 2045 07/19/21 0350 07/19/21 0810 07/19/21 1212 07/20/21 0500  PROCALCITON  --  <0.10  --   --   --   WBC 12.6*  --   --  10.9* 17.9*  LATICACIDVEN  --  2.0* 1.9  --   --     Liver Function Tests: Recent Labs  Lab 07/18/21 2045 07/19/21 1212  AST 22  --   ALT 18  --   ALKPHOS 78  --   BILITOT 1.1  --   PROT 6.7 6.4*  ALBUMIN 3.4*  --    No results for input(s): LIPASE, AMYLASE in the last 168 hours. No results for input(s): AMMONIA in the last 168 hours.  ABG    Component Value Date/Time   PHART 7.451 (H) 07/18/2021 2118   PCO2ART 44.4 07/18/2021 2118   PO2ART 116 (H) 07/18/2021 2118   HCO3 31.0 (H) 07/18/2021 2118   TCO2 32 07/18/2021 2118   O2SAT 99.0 07/18/2021 2118     Coagulation Profile: No results for input(s): INR, PROTIME in the last 168 hours.  Cardiac Enzymes: No results for input(s): CKTOTAL, CKMB, CKMBINDEX, TROPONINI in the last 168 hours.  HbA1C: HbA1c, POC (controlled diabetic range)  Date/Time Value Ref Range Status  04/26/2021 01:39 PM 8.7 (A) 0.0 - 7.0 % Final   Hgb A1c MFr Bld  Date/Time Value Ref Range Status  07/19/2021 08:10 AM 8.0 (H) 4.8 - 5.6 % Final    Comment:    (NOTE)         Prediabetes: 5.7 - 6.4         Diabetes: >6.4         Glycemic control for adults with diabetes: <7.0   11/20/2020 08:47 AM 11.3 (H) 4.8 - 5.6 % Final    Comment:             Prediabetes: 5.7 - 6.4          Diabetes: >6.4          Glycemic control for adults with diabetes: <7.0     CBG: Recent Labs  Lab  07/19/21 1558 07/19/21 2016 07/19/21 2345 07/20/21 0430 07/20/21 0723  GLUCAP 254* 321* 274* 231* 291*     Critical care time:      Richardson Landry Kahlia Lagunes ACNP Acute Care Nurse Practitioner [place] Please consult Amion 07/20/2021, 9:09 AM

## 2021-07-20 NOTE — Procedures (Signed)
Thoracentesis Procedure Note  Pre-operative Diagnosis: pleural effusion  Post-operative Diagnosis: same  Indications: pleural efusion  Procedure Details  Consent: Informed consent was obtained. Risks of the procedure were discussed including: infection, bleeding, pain, pneumothorax.  Under sterile conditions the patient was positioned. Betadine solution and sterile drapes were utilized.  1% buffered lidocaine was used to anesthetize the 5th rib space. Fluid was obtained without any difficulties and minimal blood loss.  A dressing was applied to the wound and wound care instructions were provided.   Findings 900 ml of bloody pleural fluid was obtained. A sample was sent to Pathology for cytogenetics, flow, and cell counts, as well as for infection analysis.  Complications:  None; patient tolerated the procedure well.          Condition: stable  Plan A follow up chest x-ray was ordered. Bed Rest for 1 hours. Tylenol 650 mg. for pain.    Richardson Landry Annalyce Lanpher ACNP Acute Care Nurse Practitioner [place] Please consult Amion 07/20/2021, 11:55 AM

## 2021-07-21 ENCOUNTER — Inpatient Hospital Stay (HOSPITAL_COMMUNITY): Payer: Managed Care, Other (non HMO)

## 2021-07-21 DIAGNOSIS — J90 Pleural effusion, not elsewhere classified: Secondary | ICD-10-CM | POA: Diagnosis not present

## 2021-07-21 DIAGNOSIS — J9601 Acute respiratory failure with hypoxia: Secondary | ICD-10-CM | POA: Diagnosis not present

## 2021-07-21 LAB — CBC WITH DIFFERENTIAL/PLATELET
Abs Immature Granulocytes: 0.16 10*3/uL — ABNORMAL HIGH (ref 0.00–0.07)
Basophils Absolute: 0 10*3/uL (ref 0.0–0.1)
Basophils Relative: 0 %
Eosinophils Absolute: 0 10*3/uL (ref 0.0–0.5)
Eosinophils Relative: 0 %
HCT: 28.1 % — ABNORMAL LOW (ref 39.0–52.0)
Hemoglobin: 8.9 g/dL — ABNORMAL LOW (ref 13.0–17.0)
Immature Granulocytes: 1 %
Lymphocytes Relative: 6 %
Lymphs Abs: 0.9 10*3/uL (ref 0.7–4.0)
MCH: 23.8 pg — ABNORMAL LOW (ref 26.0–34.0)
MCHC: 31.7 g/dL (ref 30.0–36.0)
MCV: 75.1 fL — ABNORMAL LOW (ref 80.0–100.0)
Monocytes Absolute: 1.8 10*3/uL — ABNORMAL HIGH (ref 0.1–1.0)
Monocytes Relative: 13 %
Neutro Abs: 10.8 10*3/uL — ABNORMAL HIGH (ref 1.7–7.7)
Neutrophils Relative %: 80 %
Platelets: 204 10*3/uL (ref 150–400)
RBC: 3.74 MIL/uL — ABNORMAL LOW (ref 4.22–5.81)
RDW: 18.1 % — ABNORMAL HIGH (ref 11.5–15.5)
WBC: 13.7 10*3/uL — ABNORMAL HIGH (ref 4.0–10.5)
nRBC: 0.1 % (ref 0.0–0.2)

## 2021-07-21 LAB — PHOSPHORUS: Phosphorus: 3.7 mg/dL (ref 2.5–4.6)

## 2021-07-21 LAB — BASIC METABOLIC PANEL
Anion gap: 8 (ref 5–15)
BUN: 23 mg/dL — ABNORMAL HIGH (ref 6–20)
CO2: 31 mmol/L (ref 22–32)
Calcium: 8.4 mg/dL — ABNORMAL LOW (ref 8.9–10.3)
Chloride: 93 mmol/L — ABNORMAL LOW (ref 98–111)
Creatinine, Ser: 1.16 mg/dL (ref 0.61–1.24)
GFR, Estimated: >60 mL/min (ref >60)
Glucose, Bld: 160 mg/dL — ABNORMAL HIGH (ref 70–99)
Potassium: 3.9 mmol/L (ref 3.5–5.1)
Sodium: 132 mmol/L — ABNORMAL LOW (ref 135–145)

## 2021-07-21 LAB — GLUCOSE, CAPILLARY
Glucose-Capillary: 167 mg/dL — ABNORMAL HIGH (ref 70–99)
Glucose-Capillary: 187 mg/dL — ABNORMAL HIGH (ref 70–99)
Glucose-Capillary: 199 mg/dL — ABNORMAL HIGH (ref 70–99)
Glucose-Capillary: 217 mg/dL — ABNORMAL HIGH (ref 70–99)
Glucose-Capillary: 236 mg/dL — ABNORMAL HIGH (ref 70–99)
Glucose-Capillary: 241 mg/dL — ABNORMAL HIGH (ref 70–99)

## 2021-07-21 MED ORDER — IOHEXOL 9 MG/ML PO SOLN
ORAL | Status: AC
  Administered 2021-07-21: 500 mL
  Filled 2021-07-21: qty 1000

## 2021-07-21 MED ORDER — IOHEXOL 300 MG/ML  SOLN
100.0000 mL | Freq: Once | INTRAMUSCULAR | Status: AC | PRN
  Administered 2021-07-21: 100 mL via INTRAVENOUS

## 2021-07-21 MED ORDER — SODIUM CHLORIDE 0.9 % IV SOLN
1.0000 g | INTRAVENOUS | Status: DC
  Administered 2021-07-21 – 2021-07-23 (×3): 1 g via INTRAVENOUS
  Administered 2021-07-25: 2 g via INTRAVENOUS
  Administered 2021-07-26: 1 g via INTRAVENOUS
  Filled 2021-07-21 (×5): qty 10

## 2021-07-21 MED ORDER — SODIUM CHLORIDE 0.9 % IV SOLN
250.0000 mg | Freq: Every day | INTRAVENOUS | Status: AC
  Administered 2021-07-21 – 2021-07-24 (×4): 250 mg via INTRAVENOUS
  Filled 2021-07-21 (×4): qty 20

## 2021-07-21 NOTE — Procedures (Signed)
Thoracentesis Procedure Note  Pre-operative Diagnosis: pleural effusion  Post-operative Diagnosis: same  Indications: pleural effusion  Procedure Details  Consent: Informed consent was obtained. Risks of the procedure were discussed including: infection, bleeding, pain, pneumothorax.  Under sterile conditions the patient was positioned. Betadine solution and sterile drapes were utilized.  1% buffered lidocaine was used to anesthetize the 5th rt  rib space. Fluid was obtained without any difficulties and minimal blood loss.  A dressing was applied to the wound and wound care instructions were provided.   Findings 1600 ml of bloody pleural fluid was obtained. A sample was sent to Pathology for cytogenetics, flow, and cell counts, as well as for infection analysis.  Complications:  None; patient tolerated the procedure well.          Condition: stable      Plan A follow up chest x-ray was ordered. Bed Rest for 1 hours. Tylenol 650 mg. for pain.  Richardson Landry Jaspreet Bodner ACNP Acute Care Nurse Practitioner [place] Please consult Amion 07/21/2021, 10:04 AM

## 2021-07-21 NOTE — TOC CAGE-AID Note (Signed)
Transition of Care [facility]) - CAGE-AID Screening   Patient Details  Name: Derek Owen MRN: [id number] Date of Birth: September 24, 1965  Transition of Care [facility]) CM/SW Contact:    Angelita Ingles, RN Phone Number:[phone]  07/21/2021, 2:40 PM   Clinical Narrative: CAGE- aid complete. Patient refused resources stating that he doesn't need them because he can quit when he is ready.    CAGE-AID Screening: Substance Abuse Screening unable to be completed due to: : Patient Refused  Have You Ever Felt You Ought to Cut Down on Your Drinking or Drug Use?: Yes Have People Annoyed You By Critizing Your Drinking Or Drug Use?: Yes Have You Felt Bad Or Guilty About Your Drinking Or Drug Use?: No Have You Ever Had a Drink or Used Drugs First Thing In The Morning to Steady Your Nerves or to Get Rid of a Hangover?: No CAGE-AID Score: 2  Substance Abuse Education Offered: Yes  Substance abuse interventions: Patient Counseling

## 2021-07-21 NOTE — Progress Notes (Signed)
NAME:  Derek Owen, MRN:  [id number], DOB:  06-Aug-1965, LOS: 2 ADMISSION DATE:  07/18/2021, CONSULTATION DATE:  07/21/2021  REFERRING MD:  Jorge Mandril, CHIEF COMPLAINT: Respiratory distress requiring BiPAP with bilateral effusions  History of Present Illness:  56 year old smoker, diabetic, with rheumatoid arthritis, unvaccinated, developed COVID infection 12/23 given a course of paxlovid , went to [facility], ED on 07/12/2021, found to have elevated D-dimer and chest x-ray showing new left pleural effusion, VQ scan was low proper PE, discharged home with steroids and albuterol. Increase shortness of breath and BIBEMS to the ED on 1/8 with respiratory distress and hypoxic to the 80s, required BiPAP emergently, bilateral pedal edema noted, BNP normal given 40 of Lasix with good diuresis .  CT angiogram chest was negative for PE, showed bilateral pleural effusions and left upper abdominal calcified mass.  Labs showed mild leukocytosis, stable anemia, normal BNP and troponin, ABG did not show significant hypercarbia  Pertinent  Medical History  Diabetes type 2 Hypertension Rheumatoid arthritis -on methotrexate, follows with Ignacia Marvel, MD, last evaluation 11/7 was asked to increase methotrexate from 15 to 25 mg but has not done this  Significant Hospital Events: Including procedures, antibiotic start and stop dates in addition to other pertinent events   1/2 VQ scan normal or low probe, no peripheral filling defects   1/9 CT angiogram chest >> no pulm embolism, large bilateral pleural effusions with compressive atelectasis, calcified mass in the left upper quadrant of abdomen 2d echo 07/20/2021 with EF 55 to 60% with some left ventricular wall a.m. labs 1/9 LT thora >> 600 cc bloody fluid, RT >> 1500 cc bloody fluid 1/10 LT >> 900 cc bloody fluid 1/11 RT thora >> 1600 bloody fluid  Interim History / Subjective:  Breathing is much improved Oxygen saturation 93% on 2 L oxygen, in fact able to  stay off oxygen for short period of time while in the bathroom  Objective   Blood pressure (!) 133/94, pulse 99, temperature 98.7 F (37.1 C), temperature source Oral, resp. rate 17, height 6' (1.829 m), weight (!) 142.4 kg, SpO2 93 %.    FiO2 (%):  [4 %] 4 %   Intake/Output Summary (Last 24 hours) at 07/21/2021 1119 Last data filed at 07/21/2021 2130 Gross per 24 hour  Intake 720 ml  Output 250 ml  Net 470 ml    Filed Weights   07/18/21 2049  Weight: (!) 142.4 kg    Examination: General: Morbid obese male no acute distress at rest HEENT: MM pink/moist, no JVD or lymphadenopathy is appreciated Neuro: Grossly intact without focal defect boisterous nature CV: S1-S2 distant, no murmur PULM: Decreased breath sounds bilateral, crackles left base GI: soft, bsx4 active obese soft positive bowel sounds GU: Yellow urine Extremities: warm/dry, 3+ edema  Skin: no rashes or lesions  Labs show stable anemia, mild hyponatremia, mild leukocytosis, elevated ferritin but low iron saturation  Chest x-ray 1/11 shows reaccumulation of right effusion  Resolved Hospital Problem list     Assessment & Plan:  Acute hypoxic respiratory failure requiring transient BiPAP , hypoxia much improved with thoracentesis and now on minimal oxygen  Etiology of bilateral effusions unclear -on thoracentesis this appeared bloody, hemorrhagic effusions possibly related to rheumatoid arthritis, he is not on anticoagulation, pulm embolism has been ruled out , he reports a fall 2 weeks ago Echo shows normal LV function Pleural fluid cytology is negative  Plan: Continue diuresis Check QuantiFERON for completion Repeat thoracentesis performed today  and right side was tapped dry, follow-up chest x-ray independently reviewed shows resolution of right effusion CT abdomen with contrast is planned to further investigate calcified left upper abdominal mass, will also obtain repeat CT chest to see if any other etiology  can be identified postthoracentesis -Chest tube was considered but due to his body habitus seems to be a difficult proposition       Best Practice (right click and "Reselect all SmartList Selections" daily)   Per primary  Labs   CBC: Recent Labs  Lab 07/18/21 2045 07/18/21 2056 07/18/21 2118 07/19/21 1212 07/20/21 0500 07/21/21 0101  WBC 12.6*  --   --  10.9* 17.9* 13.7*  NEUTROABS 10.2*  --   --   --   --  10.8*  HGB 10.8* 12.9* 11.2* 9.7* 8.9* 8.9*  HCT 34.6* 38.0* 33.0* 31.5* 28.4* 28.1*  MCV 75.7*  --   --  75.7* 76.8* 75.1*  PLT 203  --   --  204 203 204     Basic Metabolic Panel: Recent Labs  Lab 07/18/21 2045 07/18/21 2056 07/18/21 2118 07/19/21 1212 07/20/21 0500 07/21/21 0101  NA 133* 134* 133* 132* 135 132*  K 3.3* 3.3* 3.3* 4.1 4.3 3.9  CL 90* 93*  --  95* 97* 93*  CO2 26  --   --  27 29 31   GLUCOSE 255* 252*  --  284* 226* 160*  BUN 13 15  --  14 20 23*  CREATININE 1.04 0.90  --  1.06 1.25* 1.16  CALCIUM 9.0  --   --  8.1* 8.3* 8.4*  PHOS  --   --   --   --   --  3.7    GFR: Estimated Creatinine Clearance: 104.1 mL/min (by C-G formula based on SCr of 1.16 mg/dL). Recent Labs  Lab 07/18/21 2045 07/19/21 0350 07/19/21 0810 07/19/21 1212 07/20/21 0500 07/21/21 0101  PROCALCITON  --  <0.10  --   --   --   --   WBC 12.6*  --   --  10.9* 17.9* 13.7*  LATICACIDVEN  --  2.0* 1.9  --   --   --      Liver Function Tests: Recent Labs  Lab 07/18/21 2045 07/19/21 1212  AST 22  --   ALT 18  --   ALKPHOS 78  --   BILITOT 1.1  --   PROT 6.7 6.4*  ALBUMIN 3.4*  --     No results for input(s): LIPASE, AMYLASE in the last 168 hours. No results for input(s): AMMONIA in the last 168 hours.  ABG    Component Value Date/Time   PHART 7.451 (H) 07/18/2021 2118   PCO2ART 44.4 07/18/2021 2118   PO2ART 116 (H) 07/18/2021 2118   HCO3 31.0 (H) 07/18/2021 2118   TCO2 32 07/18/2021 2118   O2SAT 99.0 07/18/2021 2118      Coagulation  Profile: No results for input(s): INR, PROTIME in the last 168 hours.  Cardiac Enzymes: No results for input(s): CKTOTAL, CKMB, CKMBINDEX, TROPONINI in the last 168 hours.  HbA1C: HbA1c, POC (controlled diabetic range)  Date/Time Value Ref Range Status  04/26/2021 01:39 PM 8.7 (A) 0.0 - 7.0 % Final   Hgb A1c MFr Bld  Date/Time Value Ref Range Status  07/19/2021 08:10 AM 8.0 (H) 4.8 - 5.6 % Final    Comment:    (NOTE)         Prediabetes: 5.7 - 6.4  Diabetes: >6.4         Glycemic control for adults with diabetes: <7.0   11/20/2020 08:47 AM 11.3 (H) 4.8 - 5.6 % Final    Comment:             Prediabetes: 5.7 - 6.4          Diabetes: >6.4          Glycemic control for adults with diabetes: <7.0     CBG: Recent Labs  Lab 07/20/21 1955 07/20/21 2333 07/21/21 0339 07/21/21 0735 07/21/21 1105  GLUCAP 303* 244* 167* 199* [place] MD. FCCP. [place] [facility] Pager : [phone]  If no response to pager , please call [phone] until 7 pm After 7:00 pm call Elink  [phone]    07/21/2021, 11:19 AM

## 2021-07-21 NOTE — Evaluation (Signed)
Physical Therapy Evaluation & Discharge Patient Details Name: Derek Owen MRN: [id number] DOB: Mar 14, 1966 Today's Date: 07/21/2021  History of Present Illness  Pt is a 56 y.o. male admitted 07/18/21 with respiratory distress requiring BiPAP. Chest CTA showed bilateral pleural effusions, L upper abdominal calcified mass; negative for PE. 2D echo showed EF 55-60%. S/p thoracentesis 1/10. PMH includes DM, HTN, stroke, substance use.   Clinical Impression  Patient evaluated by Physical Therapy with no further acute PT needs identified. PTA, pt independent without DME, lives alone, works as Administrator. Today, pt independent with mobility, although limited by DOE 3/4 with ambulation; SpO2 >/88% on 2L O2 (see saturations qualifications note). All education has been completed and the patient has no further questions. Acute PT is signing off. Thank you for this referral.    HR up to 130s   Recommendations for follow up therapy are one component of a multi-disciplinary discharge planning process, led by the attending physician.  Recommendations may be updated based on patient status, additional functional criteria and insurance authorization.  Follow Up Recommendations No PT follow up    Assistance Recommended at Discharge PRN        Equipment Recommendations  (potential home O2)  Recommendations for Other Services       Functional Status Assessment       Precautions / Restrictions Precautions Precautions: Other (comment) Precaution Comments: watch SpO2 (does not wear baseline) Restrictions Weight Bearing Restrictions: No      Mobility  Bed Mobility               General bed mobility comments: Received sitting in recliner    Transfers Overall transfer level: Independent Equipment used: None                    Ambulation/Gait Ambulation/Gait assistance: Independent Gait Distance (Feet): 250 Feet Assistive device: None Gait Pattern/deviations: WFL(Within  Functional Limits)       General Gait Details: intermittent standing rest breaks secondary to SOB  Stairs            Wheelchair Mobility    Modified Rankin (Stroke Patients Only)       Balance Overall balance assessment: No apparent balance deficits (not formally assessed)                                           Pertinent Vitals/Pain Pain Assessment: Faces Faces Pain Scale: No hurt Pain Intervention(s): Monitored during session    Home Living Family/patient expects to be discharged to:: Private residence Living Arrangements: Alone Available Help at Discharge: Family;Friend(s);Available PRN/intermittently Type of Home: Mobile home Home Access: Stairs to enter Entrance Stairs-Rails: Right Entrance Stairs-Number of Steps: 3   Home Layout: One level        Prior Function Prior Level of Function : Independent/Modified Independent;Working/employed;Driving             Mobility Comments: Works as Administrator ADLs Comments: Independent     Hand Dominance        Extremity/Trunk Assessment   Upper Extremity Assessment Upper Extremity Assessment: Overall WFL for tasks assessed    Lower Extremity Assessment Lower Extremity Assessment: Overall WFL for tasks assessed    Cervical / Trunk Assessment Cervical / Trunk Assessment: Normal  Communication   Communication: No difficulties  Cognition Arousal/Alertness: Awake/alert Behavior During Therapy: WFL for tasks assessed/performed Overall Cognitive Status: Within  Functional Limits for tasks assessed                                          General Comments General comments (skin integrity, edema, etc.): educ on deep breathing strategies, energy conservation    Exercises     Assessment/Plan    PT Assessment Patient does not need any further PT services  PT Problem List         PT Treatment Interventions      PT Goals (Current goals can be found in the Care  Plan section)  Acute Rehab PT Goals PT Goal Formulation: All assessment and education complete, DC therapy    Frequency       Co-evaluation               AM-PAC PT "6 Clicks" Mobility  Outcome Measure Help needed turning from your back to your side while in a flat bed without using bedrails?: None Help needed moving from lying on your back to sitting on the side of a flat bed without using bedrails?: None Help needed moving to and from a bed to a chair (including a wheelchair)?: None Help needed standing up from a chair using your arms (e.g., wheelchair or bedside chair)?: None Help needed to walk in hospital room?: None Help needed climbing 3-5 steps with a railing? : None 6 Click Score: 24    End of Session Equipment Utilized During Treatment: Oxygen Activity Tolerance: Patient tolerated treatment well Patient left: in chair;with call bell/phone within reach Nurse Communication: Mobility status PT Visit Diagnosis: Other abnormalities of gait and mobility (R26.89)    Time: 7741-2878 PT Time Calculation (min) (ACUTE ONLY): 18 min   Charges:   PT Evaluation $PT Eval Low Complexity: 1 Low     Mabeline Caras, PT, DPT Acute Rehabilitation Services  Pager [phone] Office [place] 07/21/2021, 8:56 AM

## 2021-07-21 NOTE — Progress Notes (Signed)
Assisted to the bathroom without oxygen, sat-92 the whole time. When back to bed complained of shortness of breath. Placed on oxygen 2L [place] sat- 94%. Continue to monitor.

## 2021-07-21 NOTE — Progress Notes (Signed)
SATURATION QUALIFICATIONS: (This note is used to comply with regulatory documentation for home oxygen)  Patient Saturations on Room Air at Rest = 88%  Patient Saturations on Room Air while Ambulating = 85%  Patient Saturations on 2 Liters of oxygen while Ambulating = 90%  Please briefly explain why patient needs home oxygen: Pt required supplemental oxygen to maintain SpO2 >/88% with mobility.  Mabeline Caras, PT, DPT Acute Rehabilitation Services  Pager [phone] Office [phone]

## 2021-07-21 NOTE — Progress Notes (Signed)
Bipap not needed at this time.

## 2021-07-21 NOTE — Progress Notes (Signed)
PROGRESS NOTE    Derek Owen  WUJ:[id number] DOB: July 14, 1965 DOA: 07/18/2021 PCP: Renee Rival, FNP  Brief Narrative: 55/M with history of type 2 diabetes mellitus, alcohol abuse, hypertension, rheumatoid arthritis on methotrexate presented to the ED with respiratory distress, he was diagnosed with COVID on 12/23, treated with a course of Paxlovid, then presented to [facility] ED on 1/2 with shortness of breath, had a VQ scan which was low probability, discharged home on steroids and albuterol.  Continue to have progressive dyspnea on exertion and presented to the ED with significantly worsening dyspnea, lower extremity edema. -In the emergency room he had a CTA chest which showed no PE, large bilateral pleural effusions,?  Partially visualized LUQ mass -Pulmonary consulted, underwent bilateral thoracentesis, fluid is bloody, etiology unclear  Assessment & Plan:   Acute respiratory failure with hypoxia (HCC) Large bloody bilateral pleural effusions -Required rescue BiPAP on admission -Improving, underwent left and right thoracentesis on 1/9, 600 and 1500 mL drained -Repeat left thoracentesis 1/10, 900 mL of bloody fluid drained -Cytology from 1/9 with no malignant cells, scattered mesothelial cells, follow-up repeat cytology, -QuantiFERON TB Gold pending -Also had 2+ edema on admission, diuresed with IV Lasix, 2D echo noted preserved.,  Asymmetric LVH, hold Lasix today, clinically do not suspect infectious etiology, off antibiotics -Malignancy versus rheumatoid arthritis likely etiologies, plan for CT abdomen pelvis with IV contrast today, has iron deficiency anemia as well  ?  Partially visualized LUQ mass -Noted on CT chest -Will obtain CT abdomen pelvis with IV contrast today  Iron deficiency anemia -Denies any overt bleeding or blood loss, will check CT abdomen pelvis as noted above, has never had a colonoscopy  Type 2 diabetes mellitus, uncontrolled with hyperglycemia -Oral  hypoglycemics on hold -CBGs high in the 300s, started on Semglee, increased dose -fu hemoglobin A1c  Hypertension -Antihypertensives on hold  History of alcohol abuse -Thiamine, monitor on CIWA protocol  Hypokalemia -Replaced   DVT prophylaxis: SCDs Code Status: Full code Family Communication: Discussed patient in detail, no family at bedside Disposition Plan:  Status is: Inpatient  Remains inpatient appropriate because: Severity of illness    Consultants:  PCCM  Procedures: Left thoracentesis 1/9, 600 mL of bloody fluid drained Right thoracentesis 1/9 1500 mL of bloody fluid drained 1/10, repeat thoracentesis on the left with removal of 900 mL of bloody fluid  Antimicrobials:    Subjective: -Breathing overall better but still some shortness of breath with activity, exertion  Objective: Vitals:   07/21/21 0334 07/21/21 0737 07/21/21 0930 07/21/21 1010  BP: 113/82 (!) 141/84    Pulse: 100 (!) 105 (!) 111 100  Resp: 20 17 (!) 22 (!) 21  Temp: 98.7 F (37.1 C) 98.7 F (37.1 C)    TempSrc: Oral Oral    SpO2: 95% 94% 94% 91%  Weight:      Height:        Intake/Output Summary (Last 24 hours) at 07/21/2021 1011 Last data filed at 07/21/2021 9562 Gross per 24 hour  Intake 720 ml  Output 250 ml  Net 470 ml   Filed Weights   07/18/21 2049  Weight: (!) 142.4 kg    Examination:  General exam: Obese pleasant chronically ill male sitting up in bed, AAOx3, no distress HEENT: Neck obese unable to assess JVD CVS: S1-S2, regular rate rhythm Lungs: Decreased breath sounds bases Abdomen: Soft, nontender, bowel sounds present Extremities: 1+ edema  Skin: No rash on exposed skin Data Reviewed:   CBC: Recent  Labs  Lab 07/18/21 2045 07/18/21 2056 07/18/21 2118 07/19/21 1212 07/20/21 0500 07/21/21 0101  WBC 12.6*  --   --  10.9* 17.9* 13.7*  NEUTROABS 10.2*  --   --   --   --  10.8*  HGB 10.8* 12.9* 11.2* 9.7* 8.9* 8.9*  HCT 34.6* 38.0* 33.0* 31.5* 28.4*  28.1*  MCV 75.7*  --   --  75.7* 76.8* 75.1*  PLT 203  --   --  204 203 280   Basic Metabolic Panel: Recent Labs  Lab 07/18/21 2045 07/18/21 2056 07/18/21 2118 07/19/21 1212 07/20/21 0500 07/21/21 0101  NA 133* 134* 133* 132* 135 132*  K 3.3* 3.3* 3.3* 4.1 4.3 3.9  CL 90* 93*  --  95* 97* 93*  CO2 26  --   --  27 29 31   GLUCOSE 255* 252*  --  284* 226* 160*  BUN 13 15  --  14 20 23*  CREATININE 1.04 0.90  --  1.06 1.25* 1.16  CALCIUM 9.0  --   --  8.1* 8.3* 8.4*  PHOS  --   --   --   --   --  3.7   GFR: Estimated Creatinine Clearance: 104.1 mL/min (by C-G formula based on SCr of 1.16 mg/dL). Liver Function Tests: Recent Labs  Lab 07/18/21 2045 07/19/21 1212  AST 22  --   ALT 18  --   ALKPHOS 78  --   BILITOT 1.1  --   PROT 6.7 6.4*  ALBUMIN 3.4*  --    No results for input(s): LIPASE, AMYLASE in the last 168 hours. No results for input(s): AMMONIA in the last 168 hours. Coagulation Profile: No results for input(s): INR, PROTIME in the last 168 hours. Cardiac Enzymes: No results for input(s): CKTOTAL, CKMB, CKMBINDEX, TROPONINI in the last 168 hours. BNP (last 3 results) No results for input(s): PROBNP in the last 8760 hours. HbA1C: Recent Labs    07/19/21 0810  HGBA1C 8.0*   CBG: Recent Labs  Lab 07/20/21 1616 07/20/21 1955 07/20/21 2333 07/21/21 0339 07/21/21 0735  GLUCAP 245* 303* 244* 167* 199*   Lipid Profile: No results for input(s): CHOL, HDL, LDLCALC, TRIG, CHOLHDL, LDLDIRECT in the last 72 hours. Thyroid Function Tests: No results for input(s): TSH, T4TOTAL, FREET4, T3FREE, THYROIDAB in the last 72 hours. Anemia Panel: Recent Labs    07/20/21 1418 07/20/21 1420  VITAMINB12  --  333  FOLATE  --  21.0  FERRITIN 277  --   TIBC  --  318  IRON  --  26*  RETICCTPCT  --  2.4   Urine analysis: No results found for: COLORURINE, APPEARANCEUR, LABSPEC, PHURINE, GLUCOSEU, HGBUR, BILIRUBINUR, KETONESUR, PROTEINUR, UROBILINOGEN, NITRITE,  LEUKOCYTESUR Sepsis Labs: @LABRCNTIP (procalcitonin:4,lacticidven:4)  ) Recent Results (from the past 240 hour(s))  Resp Panel by RT-PCR (Flu A&B, Covid) Nasopharyngeal Swab     Status: None   Collection Time: 07/18/21  8:48 PM   Specimen: Nasopharyngeal Swab; Nasopharyngeal(NP) swabs in vial transport medium  Result Value Ref Range Status   SARS Coronavirus 2 by RT PCR NEGATIVE NEGATIVE Final    Comment: (NOTE) SARS-CoV-2 target nucleic acids are NOT DETECTED.  The SARS-CoV-2 RNA is generally detectable in upper respiratory specimens during the acute phase of infection. The lowest concentration of SARS-CoV-2 viral copies this assay can detect is 138 copies/mL. A negative result does not preclude SARS-Cov-2 infection and should not be used as the sole basis for treatment or other patient management decisions.  A negative result may occur with  improper specimen collection/handling, submission of specimen other than nasopharyngeal swab, presence of viral mutation(s) within the areas targeted by this assay, and inadequate number of viral copies(<138 copies/mL). A negative result must be combined with clinical observations, patient history, and epidemiological information. The expected result is Negative.  Fact Sheet for Patients:  EntrepreneurPulse.com.au  Fact Sheet for Healthcare Providers:  IncredibleEmployment.be  This test is no t yet approved or cleared by the [place] FDA and  has been authorized for detection and/or diagnosis of SARS-CoV-2 by FDA under an Emergency Use Authorization (EUA). This EUA will remain  in effect (meaning this test can be used) for the duration of the COVID-19 declaration under Section 564(b)(1) of the Act, 21 U.S.C.section 360bbb-3(b)(1), unless the authorization is terminated  or revoked sooner.       Influenza A by PCR NEGATIVE NEGATIVE Final   Influenza B by PCR NEGATIVE NEGATIVE Final    Comment:  (NOTE) The Xpert Xpress SARS-CoV-2/FLU/RSV plus assay is intended as an aid in the diagnosis of influenza from Nasopharyngeal swab specimens and should not be used as a sole basis for treatment. Nasal washings and aspirates are unacceptable for Xpert Xpress SARS-CoV-2/FLU/RSV testing.  Fact Sheet for Patients: EntrepreneurPulse.com.au  Fact Sheet for Healthcare Providers: IncredibleEmployment.be  This test is not yet approved or cleared by the [place] FDA and has been authorized for detection and/or diagnosis of SARS-CoV-2 by FDA under an Emergency Use Authorization (EUA). This EUA will remain in effect (meaning this test can be used) for the duration of the COVID-19 declaration under Section 564(b)(1) of the Act, 21 U.S.C. section 360bbb-3(b)(1), unless the authorization is terminated or revoked.  Performed at [facility], [place] [address], [place], [place] [address]   Body fluid culture w Gram Stain     Status: None (Preliminary result)   Collection Time: 07/19/21 11:02 AM   Specimen: Pleural Fluid  Result Value Ref Range Status   Specimen Description PLEURAL FLUID  Final   Special Requests LEFT  Final   Gram Stain   Final    ABUNDANT WBC PRESENT,BOTH PMN AND MONONUCLEAR NO ORGANISMS SEEN    Culture   Final    NO GROWTH 2 DAYS Performed at [facility], [place] [address] [place], [place] [address]    Report Status PENDING  Incomplete  Body fluid culture w Gram Stain     Status: None (Preliminary result)   Collection Time: 07/19/21  3:25 PM   Specimen: Pleural Fluid  Result Value Ref Range Status   Specimen Description FLUID PLEURAL  Final   Special Requests NONE  Final   Gram Stain   Final    RARE WBC PRESENT,BOTH PMN AND MONONUCLEAR NO ORGANISMS SEEN    Culture   Final    NO GROWTH 2 DAYS Performed at [facility], [place] [address] [place], [place] [address]    Report Status PENDING  Incomplete  MRSA Next  Gen by PCR, Nasal     Status: None   Collection Time: 07/20/21  2:58 PM   Specimen: Nasal Mucosa; Nasal Swab  Result Value Ref Range Status   MRSA by PCR Next Gen NOT DETECTED NOT DETECTED Final    Comment: (NOTE) The GeneXpert MRSA Assay (FDA approved for NASAL specimens only), is one component of a comprehensive MRSA colonization surveillance program. It is not intended to diagnose MRSA infection nor to guide or monitor treatment for MRSA  infections. Test performance is not FDA approved in patients less than 33 years old. Performed at [facility], [place] [address] [place], [place] [address]          Radiology Studies: DG Chest Port 1 View  Result Date: 07/21/2021 CLINICAL DATA:  Shortness of breath, respiratory distress, pleural effusions. EXAM: PORTABLE CHEST 1 VIEW COMPARISON:  Chest radiograph dated July 20, 2020. FINDINGS: The heart is enlarged. Hazy opacity in the right lower lung likely representing large pleural effusion with associated atelectasis. Small left pleural effusion. Osseous structures are unremarkable. IMPRESSION: 1. Cardiomegaly. 2. Large right pleural effusion with associated atelectasis. Underlying airspace disease can not be excluded. Small left pleural effusion. Electronically Signed   By: Keane Police D.O.   On: 07/21/2021 08:59   DG CHEST PORT 1 VIEW  Result Date: 07/20/2021 CLINICAL DATA:  56 year old male with history of shortness of breath. Status post thoracentesis. EXAM: PORTABLE CHEST 1 VIEW COMPARISON:  Chest x-ray 07/20/2021. FINDINGS: Persistent moderate to large right pleural effusion appears slightly decreased compared to the prior study. No definite pneumothorax. Opacity in the right lung base, favored to predominantly reflect areas of passive subsegmental atelectasis although underlying airspace consolidation is not excluded. Left lung is clear. No evidence of pulmonary edema. Heart size is normal. Upper mediastinal contours are within  normal limits. IMPRESSION: 1. Slightly decreased size of moderate to large right pleural effusion following thoracentesis. No postprocedural pneumothorax. Persistent passive atelectasis in the right lung base. Electronically Signed   By: Vinnie Langton M.D.   On: 07/20/2021 12:20   DG Chest Port 1 View  Result Date: 07/20/2021 CLINICAL DATA:  Respiratory distress and pleural effusions. EXAM: PORTABLE CHEST 1 VIEW COMPARISON:  Portable chest today at 15:14, chest CT today at 02:43. FINDINGS: 4:15 a.m., 07/20/2021. The cardiac size is normal. The central vessels are borderline prominent without flow cephalization and no edema is seen. Moderate/large right and moderate left pleural effusions continue to be seen with overlying opacities consistent with atelectasis or consolidation, greater on the right. The left mid and both upper lung fields remain clear. No new or worsening lung opacity is seen. The pleural effusions seem unchanged. IMPRESSION: Right-greater-than-left pleural effusions with overlying atelectasis or consolidation. Overall aeration seems unchanged. Electronically Signed   By: Telford Nab M.D.   On: 07/20/2021 05:08   DG Chest Portable 1 View  Result Date: 07/19/2021 CLINICAL DATA:  thoracentesis EXAM: PORTABLE CHEST 1 VIEW COMPARISON:  Chest x-ray 07/19/2021. FINDINGS: The heart and mediastinal contours are unchanged. Bibasilar vague airspace opacity. No pulmonary edema. Bilateral trace to small bilateral effusions. No pneumothorax. No acute osseous abnormality. IMPRESSION: Bilateral trace to small bilateral effusions. Associated bibasilar vague airspace opacities likely representing atelectasis. Superimposed infection/inflammation not excluded. Electronically Signed   By: Iven Finn M.D.   On: 07/19/2021 15:28   DG Chest Portable 1 View  Result Date: 07/19/2021 CLINICAL DATA:  Status post thoracentesis.  Shortness of breath. EXAM: PORTABLE CHEST 1 VIEW COMPARISON:  Chest radiograph  07/18/2021 and chest CTA 07/19/2021 FINDINGS: The cardiomediastinal silhouette is unchanged with normal heart size. There are persistent right larger than left pleural effusions with associated lower lobe atelectasis. Lung aeration is overall similar to yesterday's chest radiograph. No pneumothorax is identified. IMPRESSION: No pneumothorax. Bilateral pleural effusions and lower lobe atelectasis. Electronically Signed   By: Logan Bores M.D.   On: 07/19/2021 10:36   ECHOCARDIOGRAM COMPLETE  Result Date: 07/19/2021    ECHOCARDIOGRAM REPORT   Patient  Name:   Derek Owen Date of Exam: 07/19/2021 Medical Rec #:  [id number]       Height:       72.0 in Accession #:    [id number]      Weight:       314.0 lb Date of Birth:  [birth year]       BSA:          2.581 m Patient Age:    1 years        BP:           126/89 mmHg Patient Gender: M               HR:           116 bpm. Exam Location:  Inpatient Procedure: 2D Echo, Cardiac Doppler, Color Doppler and Intracardiac            Opacification Agent Indications:    CHF  History:        Patient has no prior history of Echocardiogram examinations.                 Risk Factors:Hypertension and Diabetes.  Sonographer:    Jyl Heinz Referring Phys: [place]  1. Left ventricular ejection fraction, by estimation, is 55 to 60%. The left ventricle has normal function. The left ventricle has no regional wall motion abnormalities. There is moderate asymmetric left ventricular hypertrophy of the basal-septal segment. Left ventricular diastolic parameters are indeterminate.  2. Right ventricular systolic function is normal. The right ventricular size is normal. Tricuspid regurgitation signal is inadequate for assessing PA pressure.  3. The mitral valve is normal in structure. No evidence of mitral valve regurgitation.  4. The aortic valve was not well visualized. Aortic valve regurgitation is not visualized. No aortic stenosis is present. FINDINGS  Left  Ventricle: Left ventricular ejection fraction, by estimation, is 55 to 60%. The left ventricle has normal function. The left ventricle has no regional wall motion abnormalities. The left ventricular internal cavity size was small. There is moderate  asymmetric left ventricular hypertrophy of the basal-septal segment. Left ventricular diastolic parameters are indeterminate. Right Ventricle: The right ventricular size is normal. Right vetricular wall thickness was not well visualized. Right ventricular systolic function is normal. Tricuspid regurgitation signal is inadequate for assessing PA pressure. Left Atrium: Left atrial size was not well visualized. Right Atrium: Right atrial size was not well visualized. Pericardium: There is no evidence of pericardial effusion. Mitral Valve: The mitral valve is normal in structure. No evidence of mitral valve regurgitation. Tricuspid Valve: The tricuspid valve is normal in structure. Tricuspid valve regurgitation is trivial. Aortic Valve: The aortic valve was not well visualized. Aortic valve regurgitation is not visualized. No aortic stenosis is present. Aortic valve peak gradient measures 5.3 mmHg. Pulmonic Valve: The pulmonic valve was not well visualized. Pulmonic valve regurgitation is not visualized. Aorta: The aortic root and ascending aorta are structurally normal, with no evidence of dilitation. IAS/Shunts: The interatrial septum was not well visualized.  LEFT VENTRICLE PLAX 2D LVIDd:         3.70 cm     Diastology LVIDs:         2.60 cm     LV e' medial:    7.62 cm/s LV PW:         1.20 cm     LV E/e' medial:  6.2 LV IVS:        1.70 cm  LV e' lateral:   7.72 cm/s LVOT diam:     2.20 cm     LV E/e' lateral: 6.2 LV SV:         34 LV SV Index:   13 LVOT Area:     3.80 cm  LV Volumes (MOD) LV vol d, MOD A2C: 89.9 ml LV vol d, MOD A4C: 67.0 ml LV vol s, MOD A2C: 40.5 ml LV vol s, MOD A4C: 28.5 ml LV SV MOD A2C:     49.4 ml LV SV MOD A4C:     67.0 ml LV SV MOD BP:       47.7 ml RIGHT VENTRICLE RV Basal diam:  3.70 cm RV Mid diam:    3.20 cm RV S prime:     16.40 cm/s TAPSE (M-mode): 1.7 cm LEFT ATRIUM         Index       RIGHT ATRIUM           Index LA diam:    2.30 cm 0.89 cm/m  RA Area:     10.80 cm                                 RA Volume:   22.60 ml  8.76 ml/m  AORTIC VALVE AV Area (Vmax): 2.61 cm AV Vmax:        115.00 cm/s AV Peak Grad:   5.3 mmHg LVOT Vmax:      79.00 cm/s LVOT Vmean:     52.100 cm/s LVOT VTI:       0.089 m  AORTA Ao Root diam: 3.30 cm Ao Asc diam:  3.30 cm MITRAL VALVE MV Area (PHT): 5.23 cm    SHUNTS MV Decel Time: 145 msec    Systemic VTI:  0.09 m MV E velocity: 47.60 cm/s  Systemic Diam: 2.20 cm MV A velocity: 72.40 cm/s MV E/A ratio:  0.66 Oswaldo Milian MD Electronically signed by Oswaldo Milian MD Signature Date/Time: 07/19/2021/4:04:25 PM    Final         Scheduled Meds:  folic acid  1 mg Oral Daily   insulin aspart  0-9 Units Subcutaneous Q4H   insulin aspart  3 Units Subcutaneous TID WC   insulin glargine-yfgn  25 Units Subcutaneous Daily   iohexol       mouth rinse  15 mL Mouth Rinse BID   multivitamin with minerals  1 tablet Oral Daily   thiamine  100 mg Oral Daily   Or   thiamine  100 mg Intravenous Daily   Continuous Infusions:   LOS: 2 days    Time spent: 75min    Domenic Polite, MD [facility]   07/21/2021, 10:11 AM

## 2021-07-22 ENCOUNTER — Other Ambulatory Visit: Payer: Self-pay | Admitting: Internal Medicine

## 2021-07-22 ENCOUNTER — Inpatient Hospital Stay (HOSPITAL_COMMUNITY): Payer: Managed Care, Other (non HMO)

## 2021-07-22 DIAGNOSIS — R600 Localized edema: Secondary | ICD-10-CM | POA: Diagnosis not present

## 2021-07-22 DIAGNOSIS — M0580 Other rheumatoid arthritis with rheumatoid factor of unspecified site: Secondary | ICD-10-CM

## 2021-07-22 DIAGNOSIS — J90 Pleural effusion, not elsewhere classified: Secondary | ICD-10-CM | POA: Diagnosis not present

## 2021-07-22 DIAGNOSIS — N2889 Other specified disorders of kidney and ureter: Secondary | ICD-10-CM | POA: Diagnosis not present

## 2021-07-22 DIAGNOSIS — J9601 Acute respiratory failure with hypoxia: Secondary | ICD-10-CM | POA: Diagnosis not present

## 2021-07-22 LAB — GLUCOSE, CAPILLARY
Glucose-Capillary: 193 mg/dL — ABNORMAL HIGH (ref 70–99)
Glucose-Capillary: 196 mg/dL — ABNORMAL HIGH (ref 70–99)
Glucose-Capillary: 220 mg/dL — ABNORMAL HIGH (ref 70–99)
Glucose-Capillary: 266 mg/dL — ABNORMAL HIGH (ref 70–99)
Glucose-Capillary: 337 mg/dL — ABNORMAL HIGH (ref 70–99)
Glucose-Capillary: 353 mg/dL — ABNORMAL HIGH (ref 70–99)

## 2021-07-22 LAB — BASIC METABOLIC PANEL
Anion gap: 8 (ref 5–15)
BUN: 18 mg/dL (ref 6–20)
CO2: 30 mmol/L (ref 22–32)
Calcium: 7.8 mg/dL — ABNORMAL LOW (ref 8.9–10.3)
Chloride: 95 mmol/L — ABNORMAL LOW (ref 98–111)
Creatinine, Ser: 0.95 mg/dL (ref 0.61–1.24)
GFR, Estimated: >60 mL/min (ref >60)
Glucose, Bld: 206 mg/dL — ABNORMAL HIGH (ref 70–99)
Potassium: 3.2 mmol/L — ABNORMAL LOW (ref 3.5–5.1)
Sodium: 133 mmol/L — ABNORMAL LOW (ref 135–145)

## 2021-07-22 LAB — URINALYSIS, ROUTINE W REFLEX MICROSCOPIC
Bacteria, UA: NONE SEEN
Bilirubin Urine: NEGATIVE
Glucose, UA: NEGATIVE mg/dL
Hgb urine dipstick: NEGATIVE
Ketones, ur: NEGATIVE mg/dL
Leukocytes,Ua: NEGATIVE
Nitrite: NEGATIVE
Protein, ur: NEGATIVE mg/dL
Specific Gravity, Urine: 1.008 (ref 1.005–1.030)
pH: 5 (ref 5.0–8.0)

## 2021-07-22 LAB — QUANTIFERON-TB GOLD PLUS: QuantiFERON-TB Gold Plus: UNDETERMINED — AB

## 2021-07-22 LAB — CBC
HCT: 25.7 % — ABNORMAL LOW (ref 39.0–52.0)
Hemoglobin: 8.1 g/dL — ABNORMAL LOW (ref 13.0–17.0)
MCH: 23.5 pg — ABNORMAL LOW (ref 26.0–34.0)
MCHC: 31.5 g/dL (ref 30.0–36.0)
MCV: 74.7 fL — ABNORMAL LOW (ref 80.0–100.0)
Platelets: 192 10*3/uL (ref 150–400)
RBC: 3.44 MIL/uL — ABNORMAL LOW (ref 4.22–5.81)
RDW: 18 % — ABNORMAL HIGH (ref 11.5–15.5)
WBC: 9.1 10*3/uL (ref 4.0–10.5)
nRBC: 0 % (ref 0.0–0.2)

## 2021-07-22 LAB — BODY FLUID CULTURE W GRAM STAIN: Culture: NO GROWTH

## 2021-07-22 LAB — QUANTIFERON-TB GOLD PLUS (RQFGPL)
QuantiFERON Mitogen Value: 0.14 IU/mL
QuantiFERON Nil Value: 0 IU/mL
QuantiFERON TB1 Ag Value: 0 IU/mL
QuantiFERON TB2 Ag Value: 0 IU/mL

## 2021-07-22 LAB — BRAIN NATRIURETIC PEPTIDE: B Natriuretic Peptide: 54.1 pg/mL (ref 0.0–100.0)

## 2021-07-22 MED ORDER — FUROSEMIDE 10 MG/ML IJ SOLN: 40.0000 mg | Freq: Two times a day (BID) | INTRAMUSCULAR | Status: DC

## 2021-07-22 MED ORDER — FUROSEMIDE 10 MG/ML IJ SOLN
60.0000 mg | Freq: Two times a day (BID) | INTRAMUSCULAR | Status: DC
  Administered 2021-07-22 – 2021-07-25 (×7): 60 mg via INTRAVENOUS
  Filled 2021-07-22 (×7): qty 6

## 2021-07-22 MED ORDER — POTASSIUM CHLORIDE CRYS ER 20 MEQ PO TBCR
40.0000 meq | EXTENDED_RELEASE_TABLET | Freq: Once | ORAL | Status: AC
  Administered 2021-07-22: 40 meq via ORAL
  Filled 2021-07-22: qty 2

## 2021-07-22 MED ORDER — INSULIN ASPART 100 UNIT/ML IJ SOLN
5.0000 [IU] | Freq: Three times a day (TID) | INTRAMUSCULAR | Status: DC
  Administered 2021-07-22 – 2021-07-29 (×18): 5 [IU] via SUBCUTANEOUS

## 2021-07-22 MED ORDER — ENOXAPARIN SODIUM 60 MG/0.6ML IJ SOSY
60.0000 mg | PREFILLED_SYRINGE | INTRAMUSCULAR | Status: DC
  Administered 2021-07-22 – 2021-07-24 (×3): 60 mg via SUBCUTANEOUS
  Filled 2021-07-22 (×3): qty 0.6

## 2021-07-22 MED ORDER — INSULIN GLARGINE-YFGN 100 UNIT/ML ~~LOC~~ SOLN
30.0000 [IU] | Freq: Every day | SUBCUTANEOUS | Status: DC
  Administered 2021-07-23 – 2021-07-30 (×7): 30 [IU] via SUBCUTANEOUS
  Filled 2021-07-22 (×8): qty 0.3

## 2021-07-22 NOTE — Telephone Encounter (Signed)
Next Visit: 08/16/2021  Last Visit: 05/17/2021  Last Fill: 05/17/2021  DX: Polyarthritis with positive rheumatoid factor   Current Dose per office note 05/17/2021:  increasing methotrexate stepwise to 20 mg and then 25 mg p.o. weekly  Labs: 07/22/2021 Sodium 133, Potassium 3.2, Chloride 95, Glucose 206, Calcium 7.8, RBC 3.44, Hgb 8.1, Hct 25.7, MCV 74.7, MCH 23.5, RDW 18.0,  Okay to refill MTX?

## 2021-07-22 NOTE — Progress Notes (Signed)
Inpatient Diabetes Program Recommendations  AACE/ADA: New Consensus Statement on Inpatient Glycemic Control (2015)  Target Ranges:  Prepandial:   less than 140 mg/dL      Peak postprandial:   less than 180 mg/dL (1-2 hours)      Critically ill patients:  140 - 180 mg/dL   Lab Results  Component Value Date   GLUCAP 353 (H) 07/22/2021   HGBA1C 8.0 (H) 07/19/2021    Review of Glycemic Control  Latest Reference Range & Units 07/21/21 23:36 07/22/21 03:48 07/22/21 08:26  Glucose-Capillary 70 - 99 mg/dL 187 (H) 196 (H) 353 (H)  (H): Data is abnormally high Diabetes history: Type 2 DM Outpatient Diabetes medications: Glipizide 10 mg QD, Rybelsus 14 mg QD Current orders for Inpatient glycemic control: Novolog 0-9 units Q4H, Semglee 25 units QD, Novolog 5 units TID  Inpatient Diabetes Program Recommendations:    Now that patient has diet order, consider changing correction to TID & HS and increasing Semglee to 30 units QD.   Thanks, Bronson Curb, MSN, RNC-OB Diabetes Coordinator [phone] (8a-5p)

## 2021-07-22 NOTE — Progress Notes (Addendum)
NAME:  Derek Owen, MRN:  [id number], DOB:  1965/10/08, LOS: 3 ADMISSION DATE:  07/18/2021, CONSULTATION DATE:  07/22/2021  REFERRING MD:  Jorge Mandril, CHIEF COMPLAINT: Respiratory distress requiring BiPAP with bilateral effusions  History of Present Illness:  56 year old smoker, diabetic, with rheumatoid arthritis, unvaccinated, developed COVID infection 12/23 given a course of paxlovid , went to [facility], ED on 07/12/2021, found to have elevated D-dimer and chest x-ray showing new left pleural effusion, VQ scan was low proper PE, discharged home with steroids and albuterol. Increase shortness of breath and BIBEMS to the ED on 1/8 with respiratory distress and hypoxic to the 80s, required BiPAP emergently, bilateral pedal edema noted, BNP normal given 40 of Lasix with good diuresis .  CT angiogram chest was negative for PE, showed bilateral pleural effusions and left upper abdominal calcified mass.  Labs showed mild leukocytosis, stable anemia, normal BNP and troponin, ABG did not show significant hypercarbia  Pertinent  Medical History  Diabetes type 2 Hypertension Rheumatoid arthritis -on methotrexate, follows with Ignacia Marvel, MD, last evaluation 11/7 was asked to increase methotrexate from 15 to 25 mg but has not done this  Significant Hospital Events: Including procedures, antibiotic start and stop dates in addition to other pertinent events   1/2 VQ scan normal or low probe, no peripheral filling defects   1/9 CT angiogram chest >> no pulm embolism, large bilateral pleural effusions with compressive atelectasis, calcified mass in the left upper quadrant of abdomen 2d echo 07/20/2021 with EF 55 to 60% with some left ventricular wall a.m. labs 1/9 LT thora >> 600 cc bloody fluid, RT >> 1500 cc bloody fluid 1/10 LT >> 900 cc bloody fluid 1/11 RT thora >> 1600 bloody fluid 1/11 CT of the chest with decreased pleural effusions, mass noted on pole of the kidney  Interim History /  Subjective:  Breathing is much improved Oxygen saturation 93% on 2 L oxygen, in fact able to stay off oxygen for short period of time while in the bathroom  Objective   Blood pressure (!) 141/83, pulse 98, temperature 98.8 F (37.1 C), temperature source Oral, resp. rate 17, height 6' (1.829 m), weight (!) 142.4 kg, SpO2 91 %.        Intake/Output Summary (Last 24 hours) at 07/22/2021 1001 Last data filed at 07/22/2021 0349 Gross per 24 hour  Intake 850 ml  Output 700 ml  Net 150 ml   Filed Weights   07/18/21 2049  Weight: (!) 142.4 kg    Examination: General: Obese male no acute distress HEENT: MM pink/moist no JVD or lymphadenopathy is appreciated Neuro: Grossly intact without focal defect CV: Heart sounds are regular regular rate and rhythm PULM: Diminished in the bases GI: soft, bsx4 active  GU: Amber urine Extremities: warm/dry, 2+ edema MAs Skin: no rashes or lesions   CT of the chest 2011 reveals decreased pleural effusions bilateral  Resolved Hospital Problem list     Assessment & Plan:  Acute hypoxic respiratory failure requiring transient BiPAP , hypoxia much improved with thoracentesis and now on minimal oxygen  Etiology of bilateral effusions unclear -on thoracentesis this appeared bloody, hemorrhagic effusions possibly related to rheumatoid arthritis, he is not on anticoagulation, pulm embolism has been ruled out , he reports a fall 2 weeks ago Echo shows normal LV function Pleural fluid cytology is negative  Plan: Continue diuresis as tolerated Await QuantiFERON results Has had a total for thoracentesis with a total fluid drainage of 4.6  L Repeat chest x-ray Further evaluation with MRI of mass on pole of kidney Pleural biopsy in the future         Best Practice (right click and "Reselect all SmartList Selections" daily)   Per primary  Labs   CBC: Recent Labs  Lab 07/18/21 2045 07/18/21 2056 07/18/21 2118 07/19/21 1212 07/20/21 0500  07/21/21 0101 07/22/21 0136  WBC 12.6*  --   --  10.9* 17.9* 13.7* 9.1  NEUTROABS 10.2*  --   --   --   --  10.8*  --   HGB 10.8*   < > 11.2* 9.7* 8.9* 8.9* 8.1*  HCT 34.6*   < > 33.0* 31.5* 28.4* 28.1* 25.7*  MCV 75.7*  --   --  75.7* 76.8* 75.1* 74.7*  PLT 203  --   --  204 203 204 192   < > = values in this interval not displayed.    Basic Metabolic Panel: Recent Labs  Lab 07/18/21 2045 07/18/21 2056 07/18/21 2118 07/19/21 1212 07/20/21 0500 07/21/21 0101 07/22/21 0136  NA 133* 134* 133* 132* 135 132* 133*  K 3.3* 3.3* 3.3* 4.1 4.3 3.9 3.2*  CL 90* 93*  --  95* 97* 93* 95*  CO2 26  --   --  27 29 31 30   GLUCOSE 255* 252*  --  284* 226* 160* 206*  BUN 13 15  --  14 20 23* 18  CREATININE 1.04 0.90  --  1.06 1.25* 1.16 0.95  CALCIUM 9.0  --   --  8.1* 8.3* 8.4* 7.8*  PHOS  --   --   --   --   --  3.7  --    GFR: Estimated Creatinine Clearance: 127.1 mL/min (by C-G formula based on SCr of 0.95 mg/dL). Recent Labs  Lab 07/19/21 0350 07/19/21 0810 07/19/21 1212 07/20/21 0500 07/21/21 0101 07/22/21 0136  PROCALCITON <0.10  --   --   --   --   --   WBC  --   --  10.9* 17.9* 13.7* 9.1  LATICACIDVEN 2.0* 1.9  --   --   --   --     Liver Function Tests: Recent Labs  Lab 07/18/21 2045 07/19/21 1212  AST 22  --   ALT 18  --   ALKPHOS 78  --   BILITOT 1.1  --   PROT 6.7 6.4*  ALBUMIN 3.4*  --    No results for input(s): LIPASE, AMYLASE in the last 168 hours. No results for input(s): AMMONIA in the last 168 hours.  ABG    Component Value Date/Time   PHART 7.451 (H) 07/18/2021 2118   PCO2ART 44.4 07/18/2021 2118   PO2ART 116 (H) 07/18/2021 2118   HCO3 31.0 (H) 07/18/2021 2118   TCO2 32 07/18/2021 2118   O2SAT 99.0 07/18/2021 2118     Coagulation Profile: No results for input(s): INR, PROTIME in the last 168 hours.  Cardiac Enzymes: No results for input(s): CKTOTAL, CKMB, CKMBINDEX, TROPONINI in the last 168 hours.  HbA1C: HbA1c, POC (controlled  diabetic range)  Date/Time Value Ref Range Status  04/26/2021 01:39 PM 8.7 (A) 0.0 - 7.0 % Final   Hgb A1c MFr Bld  Date/Time Value Ref Range Status  07/19/2021 08:10 AM 8.0 (H) 4.8 - 5.6 % Final    Comment:    (NOTE)         Prediabetes: 5.7 - 6.4         Diabetes: >6.4  Glycemic control for adults with diabetes: <7.0   11/20/2020 08:47 AM 11.3 (H) 4.8 - 5.6 % Final    Comment:             Prediabetes: 5.7 - 6.4          Diabetes: >6.4          Glycemic control for adults with diabetes: <7.0     CBG: Recent Labs  Lab 07/21/21 1652 07/21/21 1926 07/21/21 2336 07/22/21 0348 07/22/21 0826  GLUCAP 241* 236* 187* [place] Lanie Schelling ACNP Acute Care Nurse Practitioner [place] Please consult Amion 07/22/2021, 10:01 AM  07/22/2021, 10:01 AM

## 2021-07-22 NOTE — Progress Notes (Signed)
Pt refused Bi-PAP for the night. Showed no signs of distress and vitals were stable. Rt will continue to monitor as needed.

## 2021-07-22 NOTE — Progress Notes (Signed)
PROGRESS NOTE    Derek Owen  HLK:[id number] DOB: 22-May-1966 DOA: 07/18/2021 PCP: Renee Rival, FNP   Chief Complain: Respiratory distress  Brief Narrative: Patient is a 56 year old male with history of tobacco use, diabetes type 2, rheumatoid arthritis, recent history of COVID who presented to the emergency department at [facility] with respiratory status, hypoxic.  She was earlier seen in the ED on 1/2 and was found to have elevated D-dimer, chest x-ray showed left pleural effusion and was discharged to home with a steroid and albuterol.  On presentation, she had to be put on BiPAP.  Lab work showed elevated BNP.  CTA chest was negative for PE but showed bilateral pleural effusion, left upper abdominal calcified mass.  Lab work also showed leukocytosis.   He underwent bilateral thoracentesis, tap was bloody.  Currently on IV diuresis for volume overload.  Assessment & Plan:   Principal Problem:   Acute respiratory failure with hypoxia (HCC) Active Problems:   DM2 (diabetes mellitus, type 2) ([place])   Hypertension associated with diabetes ([place])   Bilateral pleural effusion   Mass of abdomen   Pleural effusion   Acute hypoxic respiratory failure: Secondary to bilateral pleural effusion +/- pneumonia. He required BiPAP on presentation.  Underwent bilateral thoracentesis on 1/9 .  Underwent repeat left thoracentesis on 1/10 with removal of minimal bloody fluid.  Cytology does not show malignant cells from earlier thoracentesis.  Culture did not show any organism.  QuantiFERON TB Gold pending. CT chest also showed bilateral lower lobe opacity suspicious for pneumonia. Patient has history of rheumatoid arthritis, which could be the etiology for bloody pleural fluid. Recent history of COVID, was taking Paxlovid  and prednisone  Left upper abdominal calcified mass: CT chest/abdomen/pelvis with contrast showed indeterminate partially calcified mass involving the upper pole of the left  kidney. Dedicated renal MRI (without and with contrast) recommended for further details,we will order when appropriate  Lower extremity edema: Normal BNP..  2D echo showed normal ejection fraction, asymmetric left ventricular hypertrophy.  Looks significantly volume overloaded with bilateral lower extremity edema.  We will continue Lasix 60 mg IV twice daily  Microcytic anemia: Hemoglobin currently in the range of 8-9.  Iron studies showed low iron.  Given IV iron.  No report of hematochezia or melena or acute blood loss.  CT abdomen/pelvis did not show any source of potential bleed. No history of EGD and colonoscopy in the past. Continue oral iron supplementation on DC  Type 2 diabetes: Takes oral diabetic medications at home.  Continue current insulin regimen.  Hypertension: Currently BP stable.  Antihypertensives on hold  History of alcohol use: Continue thiamine, folic acid, on CIWA protocol  History of hyperlipidemia: Takes Lipitor at home.  History of rheumatoid arthritis: Takes methotrexate.  We recommend to follow-up with hematology as an outpatient.  Hypokalemia: Supplemented with potassium  Obesity: BMI of 42.5  Deconditioning/debility: PT/OT evaluated him, no follow-up recommended            DVT prophylaxis:Lovenox Code Status: Full Family Communication: None at the bedside Patient status:Inpatient  Dispo: The patient is from: Home              Anticipated d/c is to: Home              Anticipated d/c date is: In next 2 to 3 days  Consultants: PCCM  Procedures: Thoracentesis  Antimicrobials:  Anti-infectives (From admission, onward)    Start     Dose/Rate Route Frequency  Ordered Stop   07/21/21 1630  cefTRIAXone (ROCEPHIN) 1 g in sodium chloride 0.9 % 100 mL IVPB        1 g 200 mL/hr over 30 Minutes Intravenous Every 24 hours 07/21/21 1540     07/19/21 0330  cefTRIAXone (ROCEPHIN) 1 g in sodium chloride 0.9 % 100 mL IVPB        1 g 200 mL/hr over 30  Minutes Intravenous  Once 07/19/21 0323 07/19/21 0502   07/19/21 0330  azithromycin (ZITHROMAX) 500 mg in sodium chloride 0.9 % 250 mL IVPB        500 mg 250 mL/hr over 60 Minutes Intravenous  Once 07/19/21 0323 07/19/21 3976       Subjective:  Patient seen and examined at the bedside this morning.  On 2 L of oxygen per minute.  Lying in bed, complains of pain/burning on the side of his bilateral thighs.  Not in respiratory distress but desaturates on room air.  Looks volume overloaded.   Objective: Vitals:   07/21/21 1920 07/21/21 2335 07/22/21 0330 07/22/21 0735  BP: 132/75 132/82 135/85 (!) 141/83  Pulse: (!) 108 (!) 104 100 (!) 107  Resp: 20 20 18  (!) 26  Temp: 99.3 F (37.4 C) 98.4 F (36.9 C) 98.4 F (36.9 C)   TempSrc: Oral Oral Oral   SpO2: 92% 92% 94% (!) 87%  Weight:      Height:        Intake/Output Summary (Last 24 hours) at 07/22/2021 0747 Last data filed at 07/22/2021 0349 Gross per 24 hour  Intake 1090 ml  Output 700 ml  Net 390 ml   Filed Weights   07/18/21 2049  Weight: (!) 142.4 kg    Examination:  General exam: Morbidly obese, deconditioned, weak HEENT: PERRL Respiratory system: No obvious wheezes or crackles but diminished air sounds in the bases Cardiovascular system: S1 & S2 heard, RRR.  Gastrointestinal system: Abdomen is nondistended, soft and nontender. Central nervous system: Alert and oriented Extremities: 2-3+ bilateral lower extremity pitting edema, no clubbing ,no cyanosis Skin: No rashes, no ulcers,no icterus      Data Reviewed: I have personally reviewed following labs and imaging studies  CBC: Recent Labs  Lab 07/18/21 2045 07/18/21 2056 07/18/21 2118 07/19/21 1212 07/20/21 0500 07/21/21 0101 07/22/21 0136  WBC 12.6*  --   --  10.9* 17.9* 13.7* 9.1  NEUTROABS 10.2*  --   --   --   --  10.8*  --   HGB 10.8*   < > 11.2* 9.7* 8.9* 8.9* 8.1*  HCT 34.6*   < > 33.0* 31.5* 28.4* 28.1* 25.7*  MCV 75.7*  --   --  75.7* 76.8*  75.1* 74.7*  PLT 203  --   --  204 203 204 192   < > = values in this interval not displayed.   Basic Metabolic Panel: Recent Labs  Lab 07/18/21 2045 07/18/21 2056 07/18/21 2118 07/19/21 1212 07/20/21 0500 07/21/21 0101 07/22/21 0136  NA 133* 134* 133* 132* 135 132* 133*  K 3.3* 3.3* 3.3* 4.1 4.3 3.9 3.2*  CL 90* 93*  --  95* 97* 93* 95*  CO2 26  --   --  27 29 31 30   GLUCOSE 255* 252*  --  284* 226* 160* 206*  BUN 13 15  --  14 20 23* 18  CREATININE 1.04 0.90  --  1.06 1.25* 1.16 0.95  CALCIUM 9.0  --   --  8.1* 8.3* 8.4*  7.8*  PHOS  --   --   --   --   --  3.7  --    GFR: Estimated Creatinine Clearance: 127.1 mL/min (by C-G formula based on SCr of 0.95 mg/dL). Liver Function Tests: Recent Labs  Lab 07/18/21 2045 07/19/21 1212  AST 22  --   ALT 18  --   ALKPHOS 78  --   BILITOT 1.1  --   PROT 6.7 6.4*  ALBUMIN 3.4*  --    No results for input(s): LIPASE, AMYLASE in the last 168 hours. No results for input(s): AMMONIA in the last 168 hours. Coagulation Profile: No results for input(s): INR, PROTIME in the last 168 hours. Cardiac Enzymes: No results for input(s): CKTOTAL, CKMB, CKMBINDEX, TROPONINI in the last 168 hours. BNP (last 3 results) No results for input(s): PROBNP in the last 8760 hours. HbA1C: Recent Labs    07/19/21 0810  HGBA1C 8.0*   CBG: Recent Labs  Lab 07/21/21 1105 07/21/21 1652 07/21/21 1926 07/21/21 2336 07/22/21 0348  GLUCAP 217* 241* 236* 187* 196*   Lipid Profile: No results for input(s): CHOL, HDL, LDLCALC, TRIG, CHOLHDL, LDLDIRECT in the last 72 hours. Thyroid Function Tests: No results for input(s): TSH, T4TOTAL, FREET4, T3FREE, THYROIDAB in the last 72 hours. Anemia Panel: Recent Labs    07/20/21 1418 07/20/21 1420  VITAMINB12  --  333  FOLATE  --  21.0  FERRITIN 277  --   TIBC  --  318  IRON  --  26*  RETICCTPCT  --  2.4   Sepsis Labs: Recent Labs  Lab 07/19/21 0350 07/19/21 0810  PROCALCITON <0.10  --    LATICACIDVEN 2.0* 1.9    Recent Results (from the past 240 hour(s))  Resp Panel by RT-PCR (Flu A&B, Covid) Nasopharyngeal Swab     Status: None   Collection Time: 07/18/21  8:48 PM   Specimen: Nasopharyngeal Swab; Nasopharyngeal(NP) swabs in vial transport medium  Result Value Ref Range Status   SARS Coronavirus 2 by RT PCR NEGATIVE NEGATIVE Final    Comment: (NOTE) SARS-CoV-2 target nucleic acids are NOT DETECTED.  The SARS-CoV-2 RNA is generally detectable in upper respiratory specimens during the acute phase of infection. The lowest concentration of SARS-CoV-2 viral copies this assay can detect is 138 copies/mL. A negative result does not preclude SARS-Cov-2 infection and should not be used as the sole basis for treatment or other patient management decisions. A negative result may occur with  improper specimen collection/handling, submission of specimen other than nasopharyngeal swab, presence of viral mutation(s) within the areas targeted by this assay, and inadequate number of viral copies(<138 copies/mL). A negative result must be combined with clinical observations, patient history, and epidemiological information. The expected result is Negative.  Fact Sheet for Patients:  EntrepreneurPulse.com.au  Fact Sheet for Healthcare Providers:  IncredibleEmployment.be  This test is no t yet approved or cleared by the [place] FDA and  has been authorized for detection and/or diagnosis of SARS-CoV-2 by FDA under an Emergency Use Authorization (EUA). This EUA will remain  in effect (meaning this test can be used) for the duration of the COVID-19 declaration under Section 564(b)(1) of the Act, 21 U.S.C.section 360bbb-3(b)(1), unless the authorization is terminated  or revoked sooner.       Influenza A by PCR NEGATIVE NEGATIVE Final   Influenza B by PCR NEGATIVE NEGATIVE Final    Comment: (NOTE) The Xpert Xpress SARS-CoV-2/FLU/RSV plus  assay is intended as an aid in  the diagnosis of influenza from Nasopharyngeal swab specimens and should not be used as a sole basis for treatment. Nasal washings and aspirates are unacceptable for Xpert Xpress SARS-CoV-2/FLU/RSV testing.  Fact Sheet for Patients: EntrepreneurPulse.com.au  Fact Sheet for Healthcare Providers: IncredibleEmployment.be  This test is not yet approved or cleared by the [place] FDA and has been authorized for detection and/or diagnosis of SARS-CoV-2 by FDA under an Emergency Use Authorization (EUA). This EUA will remain in effect (meaning this test can be used) for the duration of the COVID-19 declaration under Section 564(b)(1) of the Act, 21 U.S.C. section 360bbb-3(b)(1), unless the authorization is terminated or revoked.  Performed at [facility], [place] [address], [place], [place] [address]   Body fluid culture w Gram Stain     Status: None (Preliminary result)   Collection Time: 07/19/21 11:02 AM   Specimen: Pleural Fluid  Result Value Ref Range Status   Specimen Description PLEURAL FLUID  Final   Special Requests LEFT  Final   Gram Stain   Final    ABUNDANT WBC PRESENT,BOTH PMN AND MONONUCLEAR NO ORGANISMS SEEN    Culture   Final    NO GROWTH 2 DAYS Performed at [facility], [place] [address], [place], [place] [address]    Report Status PENDING  Incomplete  Body fluid culture w Gram Stain     Status: None (Preliminary result)   Collection Time: 07/19/21  3:25 PM   Specimen: Pleural Fluid  Result Value Ref Range Status   Specimen Description FLUID PLEURAL  Final   Special Requests NONE  Final   Gram Stain   Final    RARE WBC PRESENT,BOTH PMN AND MONONUCLEAR NO ORGANISMS SEEN    Culture   Final    NO GROWTH 2 DAYS Performed at [facility], [place] [address] [place], [place] [address]    Report Status PENDING  Incomplete  MRSA Next Gen by PCR, Nasal     Status: None   Collection  Time: 07/20/21  2:58 PM   Specimen: Nasal Mucosa; Nasal Swab  Result Value Ref Range Status   MRSA by PCR Next Gen NOT DETECTED NOT DETECTED Final    Comment: (NOTE) The GeneXpert MRSA Assay (FDA approved for NASAL specimens only), is one component of a comprehensive MRSA colonization surveillance program. It is not intended to diagnose MRSA infection nor to guide or monitor treatment for MRSA infections. Test performance is not FDA approved in patients less than 42 years old. Performed at [facility], [place] [address] [place], [place] [address]          Radiology Studies: CT CHEST ABDOMEN PELVIS W CONTRAST  Result Date: 07/21/2021 CLINICAL DATA:  Anemia. Partially imaged rim calcified mass in the left upper quadrant of the abdomen on prior chest CT. EXAM: CT CHEST, ABDOMEN, AND PELVIS WITH CONTRAST TECHNIQUE: Multidetector CT imaging of the chest, abdomen and pelvis was performed following the standard protocol during bolus administration of intravenous contrast. RADIATION DOSE REDUCTION: This exam was performed according to the departmental dose-optimization program which includes automated exposure control, adjustment of the mA and/or kV according to patient size and/or use of iterative reconstruction technique. CONTRAST:  135mL OMNIPAQUE IOHEXOL 300 MG/ML  SOLN COMPARISON:  Chest CTA 07/19/2021. No other relevant comparison studies. FINDINGS: CT CHEST FINDINGS Cardiovascular: No acute vascular findings are seen. There is suboptimal opacification of the pulmonary arteries, but no evidence of acute pulmonary embolism. Mild atherosclerosis of  the aorta, great vessels and coronary arteries. The heart size is normal. There is no pericardial effusion. Mediastinum/Nodes: There are no enlarged mediastinal, hilar or axillary lymph nodes. The thyroid gland, trachea and esophagus demonstrate no significant findings. Lungs/Pleura: Significant improvement in the previously demonstrated large  bilateral pleural effusions. Mild residual pleural effusions are present bilaterally. There is persistent dense consolidation in both lower lobes, left greater than right. In addition, there is mild dependent airspace disease within the right middle lobe. The upper lobes are clear. No suspicious pulmonary nodules. Mild centrilobular emphysema. Musculoskeletal/Chest wall: No chest wall mass or suspicious osseous findings. Mild thoracic spondylosis. CT ABDOMEN AND PELVIS FINDINGS Hepatobiliary: Subjective mild hepatic steatosis without focal abnormality or abnormal enhancement. No evidence of gallstones, gallbladder wall thickening or biliary dilatation. Pancreas: Unremarkable. No pancreatic ductal dilatation or surrounding inflammatory changes. Spleen: Normal in size without focal abnormality. Adrenals/Urinary Tract: The right adrenal gland appears normal. There is splaying of the limbs of the left adrenal gland by the partially calcified left renal mass, further described below. No adrenal mass identified. There is a large peripherally calcified mass involving the upper pole of the left kidney, measuring up to 11.1 x 10.7 cm transverse on image 67/3. This measures up to 10.7 cm craniocaudal on coronal image 115/5. In addition to peripheral calcifications, there are speckled internal calcifications within this lesion which is incompletely characterized. No other renal masses are identified. The right kidney appears normal. There is no hydronephrosis. The bladder appears unremarkable. Stomach/Bowel: Enteric contrast was administered and has passed into the mid colon. The stomach appears unremarkable for its degree of distension. No evidence of bowel wall thickening, distention or surrounding inflammatory change. The appendix appears normal. Vascular/Lymphatic: There are no enlarged abdominal or pelvic lymph nodes. Mild aortic and branch vessel atherosclerosis without acute vascular findings or aneurysm. The portal,  superior mesenteric and splenic veins are patent. The renal veins and IVC appear normal. Reproductive: Mild enlargement of the prostate gland. The seminal vesicles appear unremarkable. Other: Small umbilical hernia containing only fat.  No ascites. Musculoskeletal: No acute or significant osseous findings. Multilevel spondylosis contributing to biforaminal narrowing at L4-5 and L5-S1. IMPRESSION: 1. Interval significant improvement in previously demonstrated bilateral pleural effusions. There is persistent lower lobe consolidation bilaterally suspicious for pneumonia. Continued radiographic follow-up recommended. 2. Indeterminate partially calcified mass involving the upper pole of the left kidney. This is not clearly cystic by single-phase CT and demonstrates internal calcifications. Further evaluation recommended to exclude neoplasm. Optimally, this would be evaluated with dedicated renal MRI (without and with contrast) when the patient is clinically stable and able to follow directions and hold their breath (preferably as an outpatient). If unable to tolerate MRI, dedicated renal CT (pre and post-contrast imaging) could be performed. 3. Coronary and Aortic Atherosclerosis (ICD10-I70.0). Electronically Signed   By: Richardean Sale M.D.   On: 07/21/2021 14:42   DG CHEST PORT 1 VIEW  Result Date: 07/21/2021 CLINICAL DATA:  Status post thoracentesis. EXAM: PORTABLE CHEST 1 VIEW COMPARISON:  Chest radiograph 07/21/2021 at 6:40 a.m. FINDINGS: The cardiomediastinal silhouette is unchanged with normal heart size. There is a small right pleural effusion, decreased following interval thoracentesis. A small left-sided pleural effusion remains. There are patchy bibasilar airspace opacities, unchanged on the left and with improved aeration of the right lung base following thoracentesis. No pneumothorax is identified. IMPRESSION: 1. Decreased right pleural effusion following thoracentesis. No pneumothorax. 2. Persistent  small bilateral. Pleural effusions and bibasilar atelectasis or infiltrates Electronically Signed  By: Logan Bores M.D.   On: 07/21/2021 10:27   DG Chest Port 1 View  Result Date: 07/21/2021 CLINICAL DATA:  Shortness of breath, respiratory distress, pleural effusions. EXAM: PORTABLE CHEST 1 VIEW COMPARISON:  Chest radiograph dated July 20, 2020. FINDINGS: The heart is enlarged. Hazy opacity in the right lower lung likely representing large pleural effusion with associated atelectasis. Small left pleural effusion. Osseous structures are unremarkable. IMPRESSION: 1. Cardiomegaly. 2. Large right pleural effusion with associated atelectasis. Underlying airspace disease can not be excluded. Small left pleural effusion. Electronically Signed   By: Keane Police D.O.   On: 07/21/2021 08:59   DG CHEST PORT 1 VIEW  Result Date: 07/20/2021 CLINICAL DATA:  56 year old male with history of shortness of breath. Status post thoracentesis. EXAM: PORTABLE CHEST 1 VIEW COMPARISON:  Chest x-ray 07/20/2021. FINDINGS: Persistent moderate to large right pleural effusion appears slightly decreased compared to the prior study. No definite pneumothorax. Opacity in the right lung base, favored to predominantly reflect areas of passive subsegmental atelectasis although underlying airspace consolidation is not excluded. Left lung is clear. No evidence of pulmonary edema. Heart size is normal. Upper mediastinal contours are within normal limits. IMPRESSION: 1. Slightly decreased size of moderate to large right pleural effusion following thoracentesis. No postprocedural pneumothorax. Persistent passive atelectasis in the right lung base. Electronically Signed   By: Vinnie Langton M.D.   On: 07/20/2021 12:20        Scheduled Meds:  folic acid  1 mg Oral Daily   insulin aspart  0-9 Units Subcutaneous Q4H   insulin aspart  3 Units Subcutaneous TID WC   insulin glargine-yfgn  25 Units Subcutaneous Daily   mouth rinse  15 mL  Mouth Rinse BID   multivitamin with minerals  1 tablet Oral Daily   thiamine  100 mg Oral Daily   Or   thiamine  100 mg Intravenous Daily   Continuous Infusions:  cefTRIAXone (ROCEPHIN)  IV Stopped (07/21/21 1733)   ferric gluconate (FERRLECIT) IVPB Stopped (07/21/21 1547)     LOS: 3 days    Time spent: More than 50% of that time was spent in counseling and/or coordination of care.      Shelly Coss, MD [facility] P1/06/2022, 7:47 AM

## 2021-07-22 NOTE — Plan of Care (Signed)

## 2021-07-23 ENCOUNTER — Inpatient Hospital Stay (HOSPITAL_COMMUNITY): Payer: Managed Care, Other (non HMO)

## 2021-07-23 DIAGNOSIS — Z8616 Personal history of COVID-19: Secondary | ICD-10-CM

## 2021-07-23 DIAGNOSIS — R609 Edema, unspecified: Secondary | ICD-10-CM | POA: Diagnosis not present

## 2021-07-23 DIAGNOSIS — J9601 Acute respiratory failure with hypoxia: Secondary | ICD-10-CM | POA: Diagnosis not present

## 2021-07-23 DIAGNOSIS — J90 Pleural effusion, not elsewhere classified: Secondary | ICD-10-CM

## 2021-07-23 DIAGNOSIS — R600 Localized edema: Secondary | ICD-10-CM

## 2021-07-23 LAB — CBC WITH DIFFERENTIAL/PLATELET
Abs Immature Granulocytes: 0.15 10*3/uL — ABNORMAL HIGH (ref 0.00–0.07)
Basophils Absolute: 0 10*3/uL (ref 0.0–0.1)
Basophils Relative: 0 %
Eosinophils Absolute: 0.1 10*3/uL (ref 0.0–0.5)
Eosinophils Relative: 1 %
HCT: 27.8 % — ABNORMAL LOW (ref 39.0–52.0)
Hemoglobin: 8.6 g/dL — ABNORMAL LOW (ref 13.0–17.0)
Immature Granulocytes: 1 %
Lymphocytes Relative: 7 %
Lymphs Abs: 0.7 10*3/uL (ref 0.7–4.0)
MCH: 23.4 pg — ABNORMAL LOW (ref 26.0–34.0)
MCHC: 30.9 g/dL (ref 30.0–36.0)
MCV: 75.5 fL — ABNORMAL LOW (ref 80.0–100.0)
Monocytes Absolute: 1.7 10*3/uL — ABNORMAL HIGH (ref 0.1–1.0)
Monocytes Relative: 16 %
Neutro Abs: 7.7 10*3/uL (ref 1.7–7.7)
Neutrophils Relative %: 75 %
Platelets: 237 10*3/uL (ref 150–400)
RBC: 3.68 MIL/uL — ABNORMAL LOW (ref 4.22–5.81)
RDW: 17.9 % — ABNORMAL HIGH (ref 11.5–15.5)
WBC: 10.4 10*3/uL (ref 4.0–10.5)
nRBC: 0.2 % (ref 0.0–0.2)

## 2021-07-23 LAB — BODY FLUID CULTURE W GRAM STAIN: Culture: NO GROWTH

## 2021-07-23 LAB — GLUCOSE, CAPILLARY
Glucose-Capillary: 167 mg/dL — ABNORMAL HIGH (ref 70–99)
Glucose-Capillary: 179 mg/dL — ABNORMAL HIGH (ref 70–99)
Glucose-Capillary: 198 mg/dL — ABNORMAL HIGH (ref 70–99)
Glucose-Capillary: 205 mg/dL — ABNORMAL HIGH (ref 70–99)
Glucose-Capillary: 249 mg/dL — ABNORMAL HIGH (ref 70–99)
Glucose-Capillary: 297 mg/dL — ABNORMAL HIGH (ref 70–99)

## 2021-07-23 LAB — BASIC METABOLIC PANEL
Anion gap: 10 (ref 5–15)
BUN: 14 mg/dL (ref 6–20)
CO2: 32 mmol/L (ref 22–32)
Calcium: 8 mg/dL — ABNORMAL LOW (ref 8.9–10.3)
Chloride: 92 mmol/L — ABNORMAL LOW (ref 98–111)
Creatinine, Ser: 1 mg/dL (ref 0.61–1.24)
GFR, Estimated: >60 mL/min (ref >60)
Glucose, Bld: 220 mg/dL — ABNORMAL HIGH (ref 70–99)
Potassium: 3.3 mmol/L — ABNORMAL LOW (ref 3.5–5.1)
Sodium: 134 mmol/L — ABNORMAL LOW (ref 135–145)

## 2021-07-23 LAB — MISC LABCORP TEST (SEND OUT): Labcorp test code: 9985

## 2021-07-23 MED ORDER — POTASSIUM CHLORIDE CRYS ER 20 MEQ PO TBCR
40.0000 meq | EXTENDED_RELEASE_TABLET | Freq: Every day | ORAL | Status: DC
  Administered 2021-07-23 – 2021-07-27 (×4): 40 meq via ORAL
  Filled 2021-07-23 (×4): qty 2

## 2021-07-23 MED ORDER — IOHEXOL 350 MG/ML SOLN
80.0000 mL | Freq: Once | INTRAVENOUS | Status: AC | PRN
  Administered 2021-07-23: 80 mL via INTRAVENOUS

## 2021-07-23 NOTE — Consult Note (Addendum)
[name]       [place] [address]             [phone]          Reason for Consult: Bilateral pleural effusions Referring Physician: Hospitalist  Derek Owen is an 56 y.o. male.  HPI: We are asked to see this 56 year old male in cardiothoracic surgical consultation for assistance in evaluation and management of bilateral pleural effusions.  The patient has a known recent history of COVID.  He was diagnosed at urgent care on 12/23 and was given a course of Paxil admitted.Marland Kitchen  He presented on July 12, 2021 to [facility] the ER with complaints of shortness of breath as well as lower extremity swelling with pitting edema.  He was also noted to be tachycardic with wheezing and a VQ scan showed low probability.  Initial D-dimer at that time was positive.  He was discharged at that time on steroids and albuterol.  Over the next few days his shortness of breath continued to worsen and he became progressively hypoxic.  He called EMS and presented to the ER on 07/18/2021.  A CTA was obtained showing large bilateral pleural effusions.  No PE was noted.  He was placed on BiPAP and started on broad-spectrum antibiotics for possible pneumonia.  He was also noted on CT scan to have an abdominal abnormality in the left upper quadrant..  A renal CT scan is being obtained but results are pending.  He was admitted at [facility] for further evaluation and treatment.  Critical care medicine consultation was also obtained.  He has subsequently undergone thoracentesis on both the left and right side and additionally repeated on the left side.  A total of > 1500 cc has been removed from the left and >1600 cc from the right.  The drainage is noted to be bloody.  He does apparently have a history of a fall approximately 2 weeks ago and is not on any anticoagulation.  Cytology initially showed no malignancy with scattered mesothelial cells.  Due to the edema he is also started on Lasix and  echocardiogram was obtained.  Echocardiogram showed normal LV function.  He does have a known history of rheumatoid arthritis..  Cultures have not grown anything.  A QuantiFERON gold test was negative.  Chest x-ray is showing reaccumulation of fluid.  He has been found to have a microcytic anemia with hemoglobin in the 8-9 range.  Iron studies show low iron.  He has received iron supplementation.  We are asked to see the patient for consideration of possible pleural biopsies and chest tube placement.  Past Medical History:  Diagnosis Date   Alcohol use disorder, mild, abuse    Cigarette smoker    Diabetes mellitus without complication ([place])    Hyperlipidemia    Hypertension    Polycythemia 04/13/2020   Stroke ([place])    x3  Rheumatoid arthritis on methotrexate diagnosed approximately 3 months ago per patient Recent COVID     History reviewed. No pertinent surgical history.  Family History  Adopted: Yes    Social History:  reports that he has been smoking cigarettes. He has been smoking an average of 1 pack per day. He has never used smokeless tobacco. He reports current alcohol use. He reports that he does not currently use drugs.  Allergies: No Known Allergies  Medications: I have reviewed the patient's current medications. Scheduled Meds:  enoxaparin (LOVENOX) injection  60 mg  Subcutaneous B34L   folic acid  1 mg Oral Daily   furosemide  60 mg Intravenous BID   insulin aspart  0-9 Units Subcutaneous Q4H   insulin aspart  5 Units Subcutaneous TID WC   insulin glargine-yfgn  30 Units Subcutaneous Daily   mouth rinse  15 mL Mouth Rinse BID   multivitamin with minerals  1 tablet Oral Daily   potassium chloride  40 mEq Oral Daily   thiamine  100 mg Oral Daily   Or   thiamine  100 mg Intravenous Daily   Continuous Infusions:  cefTRIAXone (ROCEPHIN)  IV Stopped (07/22/21 1707)   ferric gluconate (FERRLECIT) IVPB 250 mg (07/23/21 0918)   PRN Meds:.acetaminophen **OR**  acetaminophen, ondansetron **OR** ondansetron (ZOFRAN) IV  Results for orders placed or performed during the hospital encounter of 07/18/21 (from the past 48 hour(s))  Glucose, capillary     Status: Abnormal   Collection Time: 07/21/21  4:52 PM  Result Value Ref Range   Glucose-Capillary 241 (H) 70 - 99 mg/dL    Comment: Glucose reference range applies only to samples taken after fasting for at least 8 hours.  Glucose, capillary     Status: Abnormal   Collection Time: 07/21/21  7:26 PM  Result Value Ref Range   Glucose-Capillary 236 (H) 70 - 99 mg/dL    Comment: Glucose reference range applies only to samples taken after fasting for at least 8 hours.  Glucose, capillary     Status: Abnormal   Collection Time: 07/21/21 11:36 PM  Result Value Ref Range   Glucose-Capillary 187 (H) 70 - 99 mg/dL    Comment: Glucose reference range applies only to samples taken after fasting for at least 8 hours.  CBC     Status: Abnormal   Collection Time: 07/22/21  1:36 AM  Result Value Ref Range   WBC 9.1 4.0 - 10.5 K/uL   RBC 3.44 (L) 4.22 - 5.81 MIL/uL   Hemoglobin 8.1 (L) 13.0 - 17.0 g/dL    Comment: Reticulocyte Hemoglobin testing may be clinically indicated, consider ordering this additional test [id number]    HCT 25.7 (L) 39.0 - 52.0 %   MCV 74.7 (L) 80.0 - 100.0 fL   MCH 23.5 (L) 26.0 - 34.0 pg   MCHC 31.5 30.0 - 36.0 g/dL   RDW 18.0 (H) 11.5 - 15.5 %   Platelets 192 150 - 400 K/uL    Comment: REPEATED TO VERIFY   nRBC 0.0 0.0 - 0.2 %    Comment: Performed at [facility], [place] [address] [place], [place] [address]  Basic metabolic panel     Status: Abnormal   Collection Time: 07/22/21  1:36 AM  Result Value Ref Range   Sodium 133 (L) 135 - 145 mmol/L   Potassium 3.2 (L) 3.5 - 5.1 mmol/L   Chloride 95 (L) 98 - 111 mmol/L   CO2 30 22 - 32 mmol/L   Glucose, Bld 206 (H) 70 - 99 mg/dL    Comment: Glucose reference range applies only to samples taken after fasting for at least 8  hours.   BUN 18 6 - 20 mg/dL   Creatinine, Ser 0.95 0.61 - 1.24 mg/dL   Calcium 7.8 (L) 8.9 - 10.3 mg/dL   GFR, Estimated >60 >60 mL/min    Comment: (NOTE) Calculated using the CKD-EPI Creatinine Equation (2021)    Anion gap 8 5 - 15    Comment: Performed at [facility]  [address] [place], [place] [address]  Glucose, capillary     Status: Abnormal   Collection Time: 07/22/21  3:48 AM  Result Value Ref Range   Glucose-Capillary 196 (H) 70 - 99 mg/dL    Comment: Glucose reference range applies only to samples taken after fasting for at least 8 hours.  Glucose, capillary     Status: Abnormal   Collection Time: 07/22/21  8:26 AM  Result Value Ref Range   Glucose-Capillary 353 (H) 70 - 99 mg/dL    Comment: Glucose reference range applies only to samples taken after fasting for at least 8 hours.  Brain natriuretic peptide     Status: None   Collection Time: 07/22/21  9:41 AM  Result Value Ref Range   B Natriuretic Peptide 54.1 0.0 - 100.0 pg/mL    Comment: Performed at [facility] [address], [place], [place] [address]  Glucose, capillary     Status: Abnormal   Collection Time: 07/22/21 12:09 PM  Result Value Ref Range   Glucose-Capillary 266 (H) 70 - 99 mg/dL    Comment: Glucose reference range applies only to samples taken after fasting for at least 8 hours.   Comment 1 Notify RN    Comment 2 Document in Chart   Glucose, capillary     Status: Abnormal   Collection Time: 07/22/21  4:29 PM  Result Value Ref Range   Glucose-Capillary 337 (H) 70 - 99 mg/dL    Comment: Glucose reference range applies only to samples taken after fasting for at least 8 hours.   Comment 1 Notify RN    Comment 2 Document in Chart   Glucose, capillary     Status: Abnormal   Collection Time: 07/22/21  8:05 PM  Result Value Ref Range   Glucose-Capillary 220 (H) 70 - 99 mg/dL    Comment: Glucose reference range applies only to samples taken after fasting for at least 8 hours.    Comment 1 Notify RN   Urinalysis, Routine w reflex microscopic Urine, Clean Catch     Status: Abnormal   Collection Time: 07/22/21  9:03 PM  Result Value Ref Range   Color, Urine STRAW (A) YELLOW   APPearance CLEAR CLEAR   Specific Gravity, Urine 1.008 1.005 - 1.030   pH 5.0 5.0 - 8.0   Glucose, UA NEGATIVE NEGATIVE mg/dL   Hgb urine dipstick NEGATIVE NEGATIVE   Bilirubin Urine NEGATIVE NEGATIVE   Ketones, ur NEGATIVE NEGATIVE mg/dL   Protein, ur NEGATIVE NEGATIVE mg/dL   Nitrite NEGATIVE NEGATIVE   Leukocytes,Ua NEGATIVE NEGATIVE   RBC / HPF 0-5 0 - 5 RBC/hpf   WBC, UA 0-5 0 - 5 WBC/hpf   Bacteria, UA NONE SEEN NONE SEEN    Comment: Performed at [facility] [address], [place], [place] [address]  Glucose, capillary     Status: Abnormal   Collection Time: 07/22/21 11:52 PM  Result Value Ref Range   Glucose-Capillary 193 (H) 70 - 99 mg/dL    Comment: Glucose reference range applies only to samples taken after fasting for at least 8 hours.   Comment 1 Notify RN   CBC with Differential/Platelet     Status: Abnormal   Collection Time: 07/23/21  1:10 AM  Result Value Ref Range   WBC 10.4 4.0 - 10.5 K/uL   RBC 3.68 (L) 4.22 - 5.81 MIL/uL   Hemoglobin 8.6 (L) 13.0 - 17.0 g/dL    Comment: Reticulocyte Hemoglobin testing may be clinically  indicated, consider ordering this additional test [id number]    HCT 27.8 (L) 39.0 - 52.0 %   MCV 75.5 (L) 80.0 - 100.0 fL   MCH 23.4 (L) 26.0 - 34.0 pg   MCHC 30.9 30.0 - 36.0 g/dL   RDW 17.9 (H) 11.5 - 15.5 %   Platelets 237 150 - 400 K/uL   nRBC 0.2 0.0 - 0.2 %   Neutrophils Relative % 75 %   Neutro Abs 7.7 1.7 - 7.7 K/uL   Lymphocytes Relative 7 %   Lymphs Abs 0.7 0.7 - 4.0 K/uL   Monocytes Relative 16 %   Monocytes Absolute 1.7 (H) 0.1 - 1.0 K/uL   Eosinophils Relative 1 %   Eosinophils Absolute 0.1 0.0 - 0.5 K/uL   Basophils Relative 0 %   Basophils Absolute 0.0 0.0 - 0.1 K/uL   Immature Granulocytes 1 %   Abs Immature  Granulocytes 0.15 (H) 0.00 - 0.07 K/uL    Comment: Performed at [facility], [address] [place], [place] [address]  Basic metabolic panel     Status: Abnormal   Collection Time: 07/23/21  1:10 AM  Result Value Ref Range   Sodium 134 (L) 135 - 145 mmol/L   Potassium 3.3 (L) 3.5 - 5.1 mmol/L   Chloride 92 (L) 98 - 111 mmol/L   CO2 32 22 - 32 mmol/L   Glucose, Bld 220 (H) 70 - 99 mg/dL    Comment: Glucose reference range applies only to samples taken after fasting for at least 8 hours.   BUN 14 6 - 20 mg/dL   Creatinine, Ser 1.00 0.61 - 1.24 mg/dL   Calcium 8.0 (L) 8.9 - 10.3 mg/dL   GFR, Estimated >60 >60 mL/min    Comment: (NOTE) Calculated using the CKD-EPI Creatinine Equation (2021)    Anion gap 10 5 - 15    Comment: Performed at [facility] [address] [place], [place] [address]  Glucose, capillary     Status: Abnormal   Collection Time: 07/23/21  3:36 AM  Result Value Ref Range   Glucose-Capillary 179 (H) 70 - 99 mg/dL    Comment: Glucose reference range applies only to samples taken after fasting for at least 8 hours.   Comment 1 Notify RN   Glucose, capillary     Status: Abnormal   Collection Time: 07/23/21  7:11 AM  Result Value Ref Range   Glucose-Capillary 167 (H) 70 - 99 mg/dL    Comment: Glucose reference range applies only to samples taken after fasting for at least 8 hours.  Glucose, capillary     Status: Abnormal   Collection Time: 07/23/21 11:15 AM  Result Value Ref Range   Glucose-Capillary 249 (H) 70 - 99 mg/dL    Comment: Glucose reference range applies only to samples taken after fasting for at least 8 hours.    CT CHEST ABDOMEN PELVIS W CONTRAST  Result Date: 07/21/2021 CLINICAL DATA:  Anemia. Partially imaged rim calcified mass in the left upper quadrant of the abdomen on prior chest CT. EXAM: CT CHEST, ABDOMEN, AND PELVIS WITH CONTRAST TECHNIQUE: Multidetector CT imaging of the chest, abdomen and pelvis was performed following the  standard protocol during bolus administration of intravenous contrast. RADIATION DOSE REDUCTION: This exam was performed according to the departmental dose-optimization program which includes automated exposure control, adjustment of the mA and/or kV according to patient size and/or use of iterative reconstruction technique. CONTRAST:  166mL OMNIPAQUE IOHEXOL 300 MG/ML  SOLN COMPARISON:  Chest CTA 07/19/2021. No other relevant comparison studies. FINDINGS: CT CHEST FINDINGS Cardiovascular: No acute vascular findings are seen. There is suboptimal opacification of the pulmonary arteries, but no evidence of acute pulmonary embolism. Mild atherosclerosis of the aorta, great vessels and coronary arteries. The heart size is normal. There is no pericardial effusion. Mediastinum/Nodes: There are no enlarged mediastinal, hilar or axillary lymph nodes. The thyroid gland, trachea and esophagus demonstrate no significant findings. Lungs/Pleura: Significant improvement in the previously demonstrated large bilateral pleural effusions. Mild residual pleural effusions are present bilaterally. There is persistent dense consolidation in both lower lobes, left greater than right. In addition, there is mild dependent airspace disease within the right middle lobe. The upper lobes are clear. No suspicious pulmonary nodules. Mild centrilobular emphysema. Musculoskeletal/Chest wall: No chest wall mass or suspicious osseous findings. Mild thoracic spondylosis. CT ABDOMEN AND PELVIS FINDINGS Hepatobiliary: Subjective mild hepatic steatosis without focal abnormality or abnormal enhancement. No evidence of gallstones, gallbladder wall thickening or biliary dilatation. Pancreas: Unremarkable. No pancreatic ductal dilatation or surrounding inflammatory changes. Spleen: Normal in size without focal abnormality. Adrenals/Urinary Tract: The right adrenal gland appears normal. There is splaying of the limbs of the left adrenal gland by the partially  calcified left renal mass, further described below. No adrenal mass identified. There is a large peripherally calcified mass involving the upper pole of the left kidney, measuring up to 11.1 x 10.7 cm transverse on image 67/3. This measures up to 10.7 cm craniocaudal on coronal image 115/5. In addition to peripheral calcifications, there are speckled internal calcifications within this lesion which is incompletely characterized. No other renal masses are identified. The right kidney appears normal. There is no hydronephrosis. The bladder appears unremarkable. Stomach/Bowel: Enteric contrast was administered and has passed into the mid colon. The stomach appears unremarkable for its degree of distension. No evidence of bowel wall thickening, distention or surrounding inflammatory change. The appendix appears normal. Vascular/Lymphatic: There are no enlarged abdominal or pelvic lymph nodes. Mild aortic and branch vessel atherosclerosis without acute vascular findings or aneurysm. The portal, superior mesenteric and splenic veins are patent. The renal veins and IVC appear normal. Reproductive: Mild enlargement of the prostate gland. The seminal vesicles appear unremarkable. Other: Small umbilical hernia containing only fat.  No ascites. Musculoskeletal: No acute or significant osseous findings. Multilevel spondylosis contributing to biforaminal narrowing at L4-5 and L5-S1. IMPRESSION: 1. Interval significant improvement in previously demonstrated bilateral pleural effusions. There is persistent lower lobe consolidation bilaterally suspicious for pneumonia. Continued radiographic follow-up recommended. 2. Indeterminate partially calcified mass involving the upper pole of the left kidney. This is not clearly cystic by single-phase CT and demonstrates internal calcifications. Further evaluation recommended to exclude neoplasm. Optimally, this would be evaluated with dedicated renal MRI (without and with contrast) when the  patient is clinically stable and able to follow directions and hold their breath (preferably as an outpatient). If unable to tolerate MRI, dedicated renal CT (pre and post-contrast imaging) could be performed. 3. Coronary and Aortic Atherosclerosis (ICD10-I70.0). Electronically Signed   By: Richardean Sale M.D.   On: 07/21/2021 14:42   DG CHEST PORT 1 VIEW  Result Date: 07/23/2021 CLINICAL DATA:  Pleural effusion. Shortness of breath. Respiratory distress. EXAM: PORTABLE CHEST 1 VIEW COMPARISON:  07/22/2021; 07/21/2021; 07/20/2021; CT of the chest, abdomen and pelvis 07/21/2021 FINDINGS: Grossly unchanged enlarged cardiac silhouette and mediastinal contours. Interval increase in now large loculated right-sided pleural effusion with associated progressive compressive atelectasis/collapse without definitive mediastinal shift.  Unchanged left basilar heterogeneous consolidative opacities. No definite left-sided pleural effusion. No evidence of edema. No pneumothorax. No acute osseous abnormalities. Degenerative change of the bilateral acromioclavicular joints. Stigmata of DISH within thoracic spine. IMPRESSION: 1. Progression of now large loculated right-sided effusion with associated progressive atelectasis/collapse. Further evaluation with repeat contrast-enhanced chest CT could be performed as indicated. 2. Unchanged left basilar heterogeneous consolidative opacities again worrisome for infection or aspiration. These results will be called to the ordering clinician or representative by the Radiologist Assistant, and communication documented in the PACS or [employer]. Electronically Signed   By: Sandi Mariscal M.D.   On: 07/23/2021 07:55   DG CHEST PORT 1 VIEW  Result Date: 07/22/2021 CLINICAL DATA:  Evaluate lungs EXAM: PORTABLE CHEST 1 VIEW COMPARISON:  Chest x-ray dated July 21, 2021 FINDINGS: Cardiac and mediastinal contours are unchanged. Moderate loculated right pleural effusion, increased in size  when compared with prior exam. Bibasilar opacities. No evidence of pneumothorax. IMPRESSION: 1. Moderate loculated right pleural effusion, increased in size when compared to prior exam. 2. Bibasilar opacities, concerning for infection or aspiration. Electronically Signed   By: Yetta Owen M.D.   On: 07/22/2021 10:51    Review of Systems  Constitutional:  Positive for activity change, appetite change and fatigue. Negative for chills, diaphoresis and unexpected weight change.  HENT: Negative.    Eyes: Negative.   Respiratory:  Positive for shortness of breath and wheezing.   Cardiovascular:  Positive for leg swelling.  Gastrointestinal: Negative.   Endocrine: Negative.   Genitourinary: Negative.   Musculoskeletal:  Positive for arthralgias, back pain, gait problem and myalgias.  Skin: Negative.   Allergic/Immunologic: Positive for immunocompromised state.  Hematological: Negative.   Psychiatric/Behavioral: Negative.    Blood pressure 124/79, pulse 100, temperature 98.7 F (37.1 C), temperature source Oral, resp. rate 16, height 6' (1.829 m), weight (!) 142.4 kg, SpO2 94 %. Physical Exam Constitutional:      Appearance: He is obese. He is ill-appearing. He is not toxic-appearing or diaphoretic.  HENT:     Head: Normocephalic and atraumatic.     Nose: No congestion or rhinorrhea.     Mouth/Throat:     Mouth: Mucous membranes are moist.     Pharynx: Oropharynx is clear. No oropharyngeal exudate or posterior oropharyngeal erythema.  Eyes:     General: No scleral icterus.    Extraocular Movements: Extraocular movements intact.     Conjunctiva/sclera: Conjunctivae normal.     Pupils: Pupils are equal, round, and reactive to light.  Cardiovascular:     Rate and Rhythm: Normal rate and regular rhythm.     Heart sounds: No murmur heard.   No gallop.  Pulmonary:     Breath sounds: Wheezing and rhonchi present.  Abdominal:     General: Bowel sounds are normal.     Palpations: Abdomen  is soft.     Comments: obese  Musculoskeletal:     Cervical back: Normal range of motion.     Right lower leg: Edema present.     Left lower leg: Edema present.  Lymphadenopathy:     Cervical: No cervical adenopathy.  Skin:    General: Skin is warm and dry.     Coloration: Skin is not jaundiced.     Findings: No rash.  Neurological:     General: No focal deficit present.     Mental Status: He is alert.  Psychiatric:        Mood and Affect: Mood normal.  Behavior: Behavior normal.        Thought Content: Thought content normal.    Assessment/Plan: Recurrent bilateral pleural effusions in setting of recent COVID and known rheumatoid arthritis.  Pleural cytology thus far has been negative.  He is noted to have findings of a left renal mass with uncertain diagnosis but definite potential malignancy.  CCM is requesting possible VATS for pleural biopsy and chest tube placement.  Derek Giovanni PA-C 07/23/2021, 12:41 PM    Chart reviewed, patient examined, agree with above. This 56 year old gentleman has recurrent large bilateral pleural effusions and marked lower extremity edema in the setting of recent COVID infection and rheumatoid arthritis. He had bilateral thoracentesis reportedly removing bloody fluid and cytology negative for malignancy. The right pleural effusion has recurred and is large essentially with whiteout of the right chest. Pulmonary medicine would like to have it drained with pleural biopsies. The effusion looks worse today than yesterday. I will plan to do right VATS with drainage and pleural biopsies in the morning. I don't think it can wait until Monday. I discussed the operative procedure with the patient including alternatives, benefits and risks; including but not limited to bleeding, blood transfusion, infection, respiratory failure, air leak, recurrent effusion and pleural space problems.   Derek Owen understands and agrees to proceed.  We will schedule  surgery for tomorrow morning.

## 2021-07-23 NOTE — H&P (View-Only) (Signed)
[address]       [place] [address]             [phone]          Reason for Consult: Bilateral pleural effusions Referring Physician: Hospitalist  Derek Owen is an 56 y.o. male.  HPI: We are asked to see this 56 year old male in cardiothoracic surgical consultation for assistance in evaluation and management of bilateral pleural effusions.  The patient has a known recent history of COVID.  He was diagnosed at urgent care on 12/23 and was given a course of Paxil admitted.Marland Kitchen  He presented on July 12, 2021 to [facility] the ER with complaints of shortness of breath as well as lower extremity swelling with pitting edema.  He was also noted to be tachycardic with wheezing and a VQ scan showed low probability.  Initial D-dimer at that time was positive.  He was discharged at that time on steroids and albuterol.  Over the next few days his shortness of breath continued to worsen and he became progressively hypoxic.  He called EMS and presented to the ER on 07/18/2021.  A CTA was obtained showing large bilateral pleural effusions.  No PE was noted.  He was placed on BiPAP and started on broad-spectrum antibiotics for possible pneumonia.  He was also noted on CT scan to have an abdominal abnormality in the left upper quadrant..  A renal CT scan is being obtained but results are pending.  He was admitted at [facility] for further evaluation and treatment.  Critical care medicine consultation was also obtained.  He has subsequently undergone thoracentesis on both the left and right side and additionally repeated on the left side.  A total of > 1500 cc has been removed from the left and >1600 cc from the right.  The drainage is noted to be bloody.  He does apparently have a history of a fall approximately 2 weeks ago and is not on any anticoagulation.  Cytology initially showed no malignancy with scattered mesothelial cells.  Due to the edema he is also started on Lasix and  echocardiogram was obtained.  Echocardiogram showed normal LV function.  He does have a known history of rheumatoid arthritis..  Cultures have not grown anything.  A QuantiFERON gold test was negative.  Chest x-ray is showing reaccumulation of fluid.  He has been found to have a microcytic anemia with hemoglobin in the 8-9 range.  Iron studies show low iron.  He has received iron supplementation.  We are asked to see the patient for consideration of possible pleural biopsies and chest tube placement.  Past Medical History:  Diagnosis Date   Alcohol use disorder, mild, abuse    Cigarette smoker    Diabetes mellitus without complication ([place])    Hyperlipidemia    Hypertension    Polycythemia 04/13/2020   Stroke ([place])    x3  Rheumatoid arthritis on methotrexate diagnosed approximately 3 months ago per patient Recent COVID     History reviewed. No pertinent surgical history.  Family History  Adopted: Yes    Social History:  reports that he has been smoking cigarettes. He has been smoking an average of 1 pack per day. He has never used smokeless tobacco. He reports current alcohol use. He reports that he does not currently use drugs.  Allergies: No Known Allergies  Medications: I have reviewed the patient's current medications. Scheduled Meds:  enoxaparin (LOVENOX) injection  60 mg  Subcutaneous N23F   folic acid  1 mg Oral Daily   furosemide  60 mg Intravenous BID   insulin aspart  0-9 Units Subcutaneous Q4H   insulin aspart  5 Units Subcutaneous TID WC   insulin glargine-yfgn  30 Units Subcutaneous Daily   mouth rinse  15 mL Mouth Rinse BID   multivitamin with minerals  1 tablet Oral Daily   potassium chloride  40 mEq Oral Daily   thiamine  100 mg Oral Daily   Or   thiamine  100 mg Intravenous Daily   Continuous Infusions:  cefTRIAXone (ROCEPHIN)  IV Stopped (07/22/21 1707)   ferric gluconate (FERRLECIT) IVPB 250 mg (07/23/21 0918)   PRN Meds:.acetaminophen **OR**  acetaminophen, ondansetron **OR** ondansetron (ZOFRAN) IV  Results for orders placed or performed during the hospital encounter of 07/18/21 (from the past 48 hour(s))  Glucose, capillary     Status: Abnormal   Collection Time: 07/21/21  4:52 PM  Result Value Ref Range   Glucose-Capillary 241 (H) 70 - 99 mg/dL    Comment: Glucose reference range applies only to samples taken after fasting for at least 8 hours.  Glucose, capillary     Status: Abnormal   Collection Time: 07/21/21  7:26 PM  Result Value Ref Range   Glucose-Capillary 236 (H) 70 - 99 mg/dL    Comment: Glucose reference range applies only to samples taken after fasting for at least 8 hours.  Glucose, capillary     Status: Abnormal   Collection Time: 07/21/21 11:36 PM  Result Value Ref Range   Glucose-Capillary 187 (H) 70 - 99 mg/dL    Comment: Glucose reference range applies only to samples taken after fasting for at least 8 hours.  CBC     Status: Abnormal   Collection Time: 07/22/21  1:36 AM  Result Value Ref Range   WBC 9.1 4.0 - 10.5 K/uL   RBC 3.44 (L) 4.22 - 5.81 MIL/uL   Hemoglobin 8.1 (L) 13.0 - 17.0 g/dL    Comment: Reticulocyte Hemoglobin testing may be clinically indicated, consider ordering this additional test [id number]    HCT 25.7 (L) 39.0 - 52.0 %   MCV 74.7 (L) 80.0 - 100.0 fL   MCH 23.5 (L) 26.0 - 34.0 pg   MCHC 31.5 30.0 - 36.0 g/dL   RDW 18.0 (H) 11.5 - 15.5 %   Platelets 192 150 - 400 K/uL    Comment: REPEATED TO VERIFY   nRBC 0.0 0.0 - 0.2 %    Comment: Performed at [facility], [place] [address], [place], [place] [address]  Basic metabolic panel     Status: Abnormal   Collection Time: 07/22/21  1:36 AM  Result Value Ref Range   Sodium 133 (L) 135 - 145 mmol/L   Potassium 3.2 (L) 3.5 - 5.1 mmol/L   Chloride 95 (L) 98 - 111 mmol/L   CO2 30 22 - 32 mmol/L   Glucose, Bld 206 (H) 70 - 99 mg/dL    Comment: Glucose reference range applies only to samples taken after fasting for at least 8  hours.   BUN 18 6 - 20 mg/dL   Creatinine, Ser 0.95 0.61 - 1.24 mg/dL   Calcium 7.8 (L) 8.9 - 10.3 mg/dL   GFR, Estimated >60 >60 mL/min    Comment: (NOTE) Calculated using the CKD-EPI Creatinine Equation (2021)    Anion gap 8 5 - 15    Comment: Performed at [facility]  [address], [place], [place] [address]  Glucose, capillary     Status: Abnormal   Collection Time: 07/22/21  3:48 AM  Result Value Ref Range   Glucose-Capillary 196 (H) 70 - 99 mg/dL    Comment: Glucose reference range applies only to samples taken after fasting for at least 8 hours.  Glucose, capillary     Status: Abnormal   Collection Time: 07/22/21  8:26 AM  Result Value Ref Range   Glucose-Capillary 353 (H) 70 - 99 mg/dL    Comment: Glucose reference range applies only to samples taken after fasting for at least 8 hours.  Brain natriuretic peptide     Status: None   Collection Time: 07/22/21  9:41 AM  Result Value Ref Range   B Natriuretic Peptide 54.1 0.0 - 100.0 pg/mL    Comment: Performed at [facility] [address] [place], [place] [address]  Glucose, capillary     Status: Abnormal   Collection Time: 07/22/21 12:09 PM  Result Value Ref Range   Glucose-Capillary 266 (H) 70 - 99 mg/dL    Comment: Glucose reference range applies only to samples taken after fasting for at least 8 hours.   Comment 1 Notify RN    Comment 2 Document in Chart   Glucose, capillary     Status: Abnormal   Collection Time: 07/22/21  4:29 PM  Result Value Ref Range   Glucose-Capillary 337 (H) 70 - 99 mg/dL    Comment: Glucose reference range applies only to samples taken after fasting for at least 8 hours.   Comment 1 Notify RN    Comment 2 Document in Chart   Glucose, capillary     Status: Abnormal   Collection Time: 07/22/21  8:05 PM  Result Value Ref Range   Glucose-Capillary 220 (H) 70 - 99 mg/dL    Comment: Glucose reference range applies only to samples taken after fasting for at least 8 hours.    Comment 1 Notify RN   Urinalysis, Routine w reflex microscopic Urine, Clean Catch     Status: Abnormal   Collection Time: 07/22/21  9:03 PM  Result Value Ref Range   Color, Urine STRAW (A) YELLOW   APPearance CLEAR CLEAR   Specific Gravity, Urine 1.008 1.005 - 1.030   pH 5.0 5.0 - 8.0   Glucose, UA NEGATIVE NEGATIVE mg/dL   Hgb urine dipstick NEGATIVE NEGATIVE   Bilirubin Urine NEGATIVE NEGATIVE   Ketones, ur NEGATIVE NEGATIVE mg/dL   Protein, ur NEGATIVE NEGATIVE mg/dL   Nitrite NEGATIVE NEGATIVE   Leukocytes,Ua NEGATIVE NEGATIVE   RBC / HPF 0-5 0 - 5 RBC/hpf   WBC, UA 0-5 0 - 5 WBC/hpf   Bacteria, UA NONE SEEN NONE SEEN    Comment: Performed at [facility] [address] [place], [place] [address]  Glucose, capillary     Status: Abnormal   Collection Time: 07/22/21 11:52 PM  Result Value Ref Range   Glucose-Capillary 193 (H) 70 - 99 mg/dL    Comment: Glucose reference range applies only to samples taken after fasting for at least 8 hours.   Comment 1 Notify RN   CBC with Differential/Platelet     Status: Abnormal   Collection Time: 07/23/21  1:10 AM  Result Value Ref Range   WBC 10.4 4.0 - 10.5 K/uL   RBC 3.68 (L) 4.22 - 5.81 MIL/uL   Hemoglobin 8.6 (L) 13.0 - 17.0 g/dL    Comment: Reticulocyte Hemoglobin testing may be clinically  indicated, consider ordering this additional test [id number]    HCT 27.8 (L) 39.0 - 52.0 %   MCV 75.5 (L) 80.0 - 100.0 fL   MCH 23.4 (L) 26.0 - 34.0 pg   MCHC 30.9 30.0 - 36.0 g/dL   RDW 17.9 (H) 11.5 - 15.5 %   Platelets 237 150 - 400 K/uL   nRBC 0.2 0.0 - 0.2 %   Neutrophils Relative % 75 %   Neutro Abs 7.7 1.7 - 7.7 K/uL   Lymphocytes Relative 7 %   Lymphs Abs 0.7 0.7 - 4.0 K/uL   Monocytes Relative 16 %   Monocytes Absolute 1.7 (H) 0.1 - 1.0 K/uL   Eosinophils Relative 1 %   Eosinophils Absolute 0.1 0.0 - 0.5 K/uL   Basophils Relative 0 %   Basophils Absolute 0.0 0.0 - 0.1 K/uL   Immature Granulocytes 1 %   Abs Immature  Granulocytes 0.15 (H) 0.00 - 0.07 K/uL    Comment: Performed at [facility], [address], [place], [place] [address]  Basic metabolic panel     Status: Abnormal   Collection Time: 07/23/21  1:10 AM  Result Value Ref Range   Sodium 134 (L) 135 - 145 mmol/L   Potassium 3.3 (L) 3.5 - 5.1 mmol/L   Chloride 92 (L) 98 - 111 mmol/L   CO2 32 22 - 32 mmol/L   Glucose, Bld 220 (H) 70 - 99 mg/dL    Comment: Glucose reference range applies only to samples taken after fasting for at least 8 hours.   BUN 14 6 - 20 mg/dL   Creatinine, Ser 1.00 0.61 - 1.24 mg/dL   Calcium 8.0 (L) 8.9 - 10.3 mg/dL   GFR, Estimated >60 >60 mL/min    Comment: (NOTE) Calculated using the CKD-EPI Creatinine Equation (2021)    Anion gap 10 5 - 15    Comment: Performed at [facility] [address] [place], [place] [address]  Glucose, capillary     Status: Abnormal   Collection Time: 07/23/21  3:36 AM  Result Value Ref Range   Glucose-Capillary 179 (H) 70 - 99 mg/dL    Comment: Glucose reference range applies only to samples taken after fasting for at least 8 hours.   Comment 1 Notify RN   Glucose, capillary     Status: Abnormal   Collection Time: 07/23/21  7:11 AM  Result Value Ref Range   Glucose-Capillary 167 (H) 70 - 99 mg/dL    Comment: Glucose reference range applies only to samples taken after fasting for at least 8 hours.  Glucose, capillary     Status: Abnormal   Collection Time: 07/23/21 11:15 AM  Result Value Ref Range   Glucose-Capillary 249 (H) 70 - 99 mg/dL    Comment: Glucose reference range applies only to samples taken after fasting for at least 8 hours.    CT CHEST ABDOMEN PELVIS W CONTRAST  Result Date: 07/21/2021 CLINICAL DATA:  Anemia. Partially imaged rim calcified mass in the left upper quadrant of the abdomen on prior chest CT. EXAM: CT CHEST, ABDOMEN, AND PELVIS WITH CONTRAST TECHNIQUE: Multidetector CT imaging of the chest, abdomen and pelvis was performed following the  standard protocol during bolus administration of intravenous contrast. RADIATION DOSE REDUCTION: This exam was performed according to the departmental dose-optimization program which includes automated exposure control, adjustment of the mA and/or kV according to patient size and/or use of iterative reconstruction technique. CONTRAST:  155mL OMNIPAQUE IOHEXOL 300 MG/ML  SOLN COMPARISON:  Chest CTA 07/19/2021. No other relevant comparison studies. FINDINGS: CT CHEST FINDINGS Cardiovascular: No acute vascular findings are seen. There is suboptimal opacification of the pulmonary arteries, but no evidence of acute pulmonary embolism. Mild atherosclerosis of the aorta, great vessels and coronary arteries. The heart size is normal. There is no pericardial effusion. Mediastinum/Nodes: There are no enlarged mediastinal, hilar or axillary lymph nodes. The thyroid gland, trachea and esophagus demonstrate no significant findings. Lungs/Pleura: Significant improvement in the previously demonstrated large bilateral pleural effusions. Mild residual pleural effusions are present bilaterally. There is persistent dense consolidation in both lower lobes, left greater than right. In addition, there is mild dependent airspace disease within the right middle lobe. The upper lobes are clear. No suspicious pulmonary nodules. Mild centrilobular emphysema. Musculoskeletal/Chest wall: No chest wall mass or suspicious osseous findings. Mild thoracic spondylosis. CT ABDOMEN AND PELVIS FINDINGS Hepatobiliary: Subjective mild hepatic steatosis without focal abnormality or abnormal enhancement. No evidence of gallstones, gallbladder wall thickening or biliary dilatation. Pancreas: Unremarkable. No pancreatic ductal dilatation or surrounding inflammatory changes. Spleen: Normal in size without focal abnormality. Adrenals/Urinary Tract: The right adrenal gland appears normal. There is splaying of the limbs of the left adrenal gland by the partially  calcified left renal mass, further described below. No adrenal mass identified. There is a large peripherally calcified mass involving the upper pole of the left kidney, measuring up to 11.1 x 10.7 cm transverse on image 67/3. This measures up to 10.7 cm craniocaudal on coronal image 115/5. In addition to peripheral calcifications, there are speckled internal calcifications within this lesion which is incompletely characterized. No other renal masses are identified. The right kidney appears normal. There is no hydronephrosis. The bladder appears unremarkable. Stomach/Bowel: Enteric contrast was administered and has passed into the mid colon. The stomach appears unremarkable for its degree of distension. No evidence of bowel wall thickening, distention or surrounding inflammatory change. The appendix appears normal. Vascular/Lymphatic: There are no enlarged abdominal or pelvic lymph nodes. Mild aortic and branch vessel atherosclerosis without acute vascular findings or aneurysm. The portal, superior mesenteric and splenic veins are patent. The renal veins and IVC appear normal. Reproductive: Mild enlargement of the prostate gland. The seminal vesicles appear unremarkable. Other: Small umbilical hernia containing only fat.  No ascites. Musculoskeletal: No acute or significant osseous findings. Multilevel spondylosis contributing to biforaminal narrowing at L4-5 and L5-S1. IMPRESSION: 1. Interval significant improvement in previously demonstrated bilateral pleural effusions. There is persistent lower lobe consolidation bilaterally suspicious for pneumonia. Continued radiographic follow-up recommended. 2. Indeterminate partially calcified mass involving the upper pole of the left kidney. This is not clearly cystic by single-phase CT and demonstrates internal calcifications. Further evaluation recommended to exclude neoplasm. Optimally, this would be evaluated with dedicated renal MRI (without and with contrast) when the  patient is clinically stable and able to follow directions and hold their breath (preferably as an outpatient). If unable to tolerate MRI, dedicated renal CT (pre and post-contrast imaging) could be performed. 3. Coronary and Aortic Atherosclerosis (ICD10-I70.0). Electronically Signed   By: Richardean Sale M.D.   On: 07/21/2021 14:42   DG CHEST PORT 1 VIEW  Result Date: 07/23/2021 CLINICAL DATA:  Pleural effusion. Shortness of breath. Respiratory distress. EXAM: PORTABLE CHEST 1 VIEW COMPARISON:  07/22/2021; 07/21/2021; 07/20/2021; CT of the chest, abdomen and pelvis 07/21/2021 FINDINGS: Grossly unchanged enlarged cardiac silhouette and mediastinal contours. Interval increase in now large loculated right-sided pleural effusion with associated progressive compressive atelectasis/collapse without definitive mediastinal shift.  Unchanged left basilar heterogeneous consolidative opacities. No definite left-sided pleural effusion. No evidence of edema. No pneumothorax. No acute osseous abnormalities. Degenerative change of the bilateral acromioclavicular joints. Stigmata of DISH within thoracic spine. IMPRESSION: 1. Progression of now large loculated right-sided effusion with associated progressive atelectasis/collapse. Further evaluation with repeat contrast-enhanced chest CT could be performed as indicated. 2. Unchanged left basilar heterogeneous consolidative opacities again worrisome for infection or aspiration. These results will be called to the ordering clinician or representative by the Radiologist Assistant, and communication documented in the PACS or [employer]. Electronically Signed   By: Sandi Mariscal M.D.   On: 07/23/2021 07:55   DG CHEST PORT 1 VIEW  Result Date: 07/22/2021 CLINICAL DATA:  Evaluate lungs EXAM: PORTABLE CHEST 1 VIEW COMPARISON:  Chest x-ray dated July 21, 2021 FINDINGS: Cardiac and mediastinal contours are unchanged. Moderate loculated right pleural effusion, increased in size  when compared with prior exam. Bibasilar opacities. No evidence of pneumothorax. IMPRESSION: 1. Moderate loculated right pleural effusion, increased in size when compared to prior exam. 2. Bibasilar opacities, concerning for infection or aspiration. Electronically Signed   By: Yetta Owen M.D.   On: 07/22/2021 10:51    Review of Systems  Constitutional:  Positive for activity change, appetite change and fatigue. Negative for chills, diaphoresis and unexpected weight change.  HENT: Negative.    Eyes: Negative.   Respiratory:  Positive for shortness of breath and wheezing.   Cardiovascular:  Positive for leg swelling.  Gastrointestinal: Negative.   Endocrine: Negative.   Genitourinary: Negative.   Musculoskeletal:  Positive for arthralgias, back pain, gait problem and myalgias.  Skin: Negative.   Allergic/Immunologic: Positive for immunocompromised state.  Hematological: Negative.   Psychiatric/Behavioral: Negative.    Blood pressure 124/79, pulse 100, temperature 98.7 F (37.1 C), temperature source Oral, resp. rate 16, height 6' (1.829 m), weight (!) 142.4 kg, SpO2 94 %. Physical Exam Constitutional:      Appearance: He is obese. He is ill-appearing. He is not toxic-appearing or diaphoretic.  HENT:     Head: Normocephalic and atraumatic.     Nose: No congestion or rhinorrhea.     Mouth/Throat:     Mouth: Mucous membranes are moist.     Pharynx: Oropharynx is clear. No oropharyngeal exudate or posterior oropharyngeal erythema.  Eyes:     General: No scleral icterus.    Extraocular Movements: Extraocular movements intact.     Conjunctiva/sclera: Conjunctivae normal.     Pupils: Pupils are equal, round, and reactive to light.  Cardiovascular:     Rate and Rhythm: Normal rate and regular rhythm.     Heart sounds: No murmur heard.   No gallop.  Pulmonary:     Breath sounds: Wheezing and rhonchi present.  Abdominal:     General: Bowel sounds are normal.     Palpations: Abdomen  is soft.     Comments: obese  Musculoskeletal:     Cervical back: Normal range of motion.     Right lower leg: Edema present.     Left lower leg: Edema present.  Lymphadenopathy:     Cervical: No cervical adenopathy.  Skin:    General: Skin is warm and dry.     Coloration: Skin is not jaundiced.     Findings: No rash.  Neurological:     General: No focal deficit present.     Mental Status: He is alert.  Psychiatric:        Mood and Affect: Mood normal.  Behavior: Behavior normal.        Thought Content: Thought content normal.    Assessment/Plan: Recurrent bilateral pleural effusions in setting of recent COVID and known rheumatoid arthritis.  Pleural cytology thus far has been negative.  He is noted to have findings of a left renal mass with uncertain diagnosis but definite potential malignancy.  CCM is requesting possible VATS for pleural biopsy and chest tube placement.  John Giovanni PA-C 07/23/2021, 12:41 PM    Chart reviewed, patient examined, agree with above. This 56 year old gentleman has recurrent large bilateral pleural effusions and marked lower extremity edema in the setting of recent COVID infection and rheumatoid arthritis. He had bilateral thoracentesis reportedly removing bloody fluid and cytology negative for malignancy. The right pleural effusion has recurred and is large essentially with whiteout of the right chest. Pulmonary medicine would like to have it drained with pleural biopsies. The effusion looks worse today than yesterday. I will plan to do right VATS with drainage and pleural biopsies in the morning. I don't think it can wait until Monday. I discussed the operative procedure with the patient including alternatives, benefits and risks; including but not limited to bleeding, blood transfusion, infection, respiratory failure, air leak, recurrent effusion and pleural space problems.   Derek Owen understands and agrees to proceed.  We will schedule  surgery for tomorrow morning.

## 2021-07-23 NOTE — Progress Notes (Signed)
NAME:  Derek Owen, MRN:  [id number], DOB:  1966-01-07, LOS: 4 ADMISSION DATE:  07/18/2021, CONSULTATION DATE:  07/23/2021  REFERRING MD:  Jorge Mandril, CHIEF COMPLAINT: Respiratory distress requiring BiPAP with bilateral effusions  History of Present Illness:  56 year old smoker, diabetic, with rheumatoid arthritis, unvaccinated, developed COVID infection 12/23 given a course of paxlovid , went to [facility], ED on 07/12/2021, found to have elevated D-dimer and chest x-ray showing new left pleural effusion, VQ scan was low proper PE, discharged home with steroids and albuterol. Increase shortness of breath and BIBEMS to the ED on 1/8 with respiratory distress and hypoxic to the 80s, required BiPAP emergently, bilateral pedal edema noted, BNP normal given 40 of Lasix with good diuresis .  CT angiogram chest was negative for PE, showed bilateral pleural effusions and left upper abdominal calcified mass.  Labs showed mild leukocytosis, stable anemia, normal BNP and troponin, ABG did not show significant hypercarbia  Pertinent  Medical History  Diabetes type 2 Hypertension Rheumatoid arthritis -on methotrexate, follows with Ignacia Marvel, MD, last evaluation 11/7 was asked to increase methotrexate from 15 to 25 mg but has not done this  Significant Hospital Events: Including procedures, antibiotic start and stop dates in addition to other pertinent events   1/2 VQ scan normal or low probe, no peripheral filling defects   1/9 CT angiogram chest >> no pulm embolism, large bilateral pleural effusions with compressive atelectasis, calcified mass in the left upper quadrant of abdomen 2d echo 07/20/2021 with EF 55 to 60% with some left ventricular wall a.m. labs 1/9 LT thora >> 600 cc bloody fluid, RT >> 1500 cc bloody fluid 1/10 LT >> 900 cc bloody fluid 1/11 RT thora >> 1600 bloody fluid 1/11 CT of the chest with decreased pleural effusions, mass noted on pole of the kidney 07/23/2021 return of right  pleural effusion  Interim History / Subjective:  Right pleural effusion has returned cardiovascular thoracic consult has been obtained  Objective   Blood pressure 134/90, pulse 90, temperature 98.1 F (36.7 C), temperature source Oral, resp. rate 19, height 6' (1.829 m), weight (!) 142.4 kg, SpO2 96 %.        Intake/Output Summary (Last 24 hours) at 07/23/2021 1049 Last data filed at 07/23/2021 4656 Gross per 24 hour  Intake 1930 ml  Output 4600 ml  Net -2670 ml   Filed Weights   07/18/21 2049  Weight: (!) 142.4 kg    Examination: General: Obese male no acute distress HEENT: MM pink/moist Neuro: No focal deep CV: Heart sounds are regular PULM: Decreased breath sounds in the bases  GI: soft, bsx4 active  GU: Voids Extremities: warm/dry, 2+ edema  Skin: no rashes or lesions    Chest x-ray 07/23/2021 with return of massive right pleural effusion  Resolved Hospital Problem list     Assessment & Plan:  Acute hypoxic respiratory failure requiring transient BiPAP , hypoxia much improved with thoracentesis and now on minimal oxygen  Etiology of bilateral effusions unclear -on thoracentesis this appeared bloody, hemorrhagic effusions possibly related to rheumatoid arthritis, he is not on anticoagulation, pulm embolism has been ruled out , he reports a fall 2 weeks ago Echo shows normal LV function Pleural fluid cytology is negative Repeat chest x-ray 07/23/2021 social reaccumulation of right pleural effusion Calcified mass left kidney pole  Plan: Cardiovascular thoracic surgeon consult for pleural biopsy and chest tube QuantiFERON gold is negative Continue to follow serial chest x-ray Further evaluation of left kidney  mass         Best Practice (right click and "Reselect all SmartList Selections" daily)   Per primary  Labs   CBC: Recent Labs  Lab 07/18/21 2045 07/18/21 2056 07/19/21 1212 07/20/21 0500 07/21/21 0101 07/22/21 0136 07/23/21 0110  WBC  12.6*  --  10.9* 17.9* 13.7* 9.1 10.4  NEUTROABS 10.2*  --   --   --  10.8*  --  7.7  HGB 10.8*   < > 9.7* 8.9* 8.9* 8.1* 8.6*  HCT 34.6*   < > 31.5* 28.4* 28.1* 25.7* 27.8*  MCV 75.7*  --  75.7* 76.8* 75.1* 74.7* 75.5*  PLT 203  --  204 203 204 192 237   < > = values in this interval not displayed.    Basic Metabolic Panel: Recent Labs  Lab 07/19/21 1212 07/20/21 0500 07/21/21 0101 07/22/21 0136 07/23/21 0110  NA 132* 135 132* 133* 134*  K 4.1 4.3 3.9 3.2* 3.3*  CL 95* 97* 93* 95* 92*  CO2 27 29 31 30  32  GLUCOSE 284* 226* 160* 206* 220*  BUN 14 20 23* 18 14  CREATININE 1.06 1.25* 1.16 0.95 1.00  CALCIUM 8.1* 8.3* 8.4* 7.8* 8.0*  PHOS  --   --  3.7  --   --    GFR: Estimated Creatinine Clearance: 120.8 mL/min (by C-G formula based on SCr of 1 mg/dL). Recent Labs  Lab 07/19/21 0350 07/19/21 0810 07/19/21 1212 07/20/21 0500 07/21/21 0101 07/22/21 0136 07/23/21 0110  PROCALCITON <0.10  --   --   --   --   --   --   WBC  --   --    < > 17.9* 13.7* 9.1 10.4  LATICACIDVEN 2.0* 1.9  --   --   --   --   --    < > = values in this interval not displayed.    Liver Function Tests: Recent Labs  Lab 07/18/21 2045 07/19/21 1212  AST 22  --   ALT 18  --   ALKPHOS 78  --   BILITOT 1.1  --   PROT 6.7 6.4*  ALBUMIN 3.4*  --    No results for input(s): LIPASE, AMYLASE in the last 168 hours. No results for input(s): AMMONIA in the last 168 hours.  ABG    Component Value Date/Time   PHART 7.451 (H) 07/18/2021 2118   PCO2ART 44.4 07/18/2021 2118   PO2ART 116 (H) 07/18/2021 2118   HCO3 31.0 (H) 07/18/2021 2118   TCO2 32 07/18/2021 2118   O2SAT 99.0 07/18/2021 2118     Coagulation Profile: No results for input(s): INR, PROTIME in the last 168 hours.  Cardiac Enzymes: No results for input(s): CKTOTAL, CKMB, CKMBINDEX, TROPONINI in the last 168 hours.  HbA1C: HbA1c, POC (controlled diabetic range)  Date/Time Value Ref Range Status  04/26/2021 01:39 PM 8.7 (A) 0.0  - 7.0 % Final   Hgb A1c MFr Bld  Date/Time Value Ref Range Status  07/19/2021 08:10 AM 8.0 (H) 4.8 - 5.6 % Final    Comment:    (NOTE)         Prediabetes: 5.7 - 6.4         Diabetes: >6.4         Glycemic control for adults with diabetes: <7.0   11/20/2020 08:47 AM 11.3 (H) 4.8 - 5.6 % Final    Comment:             Prediabetes: 5.7 -  6.4          Diabetes: >6.4          Glycemic control for adults with diabetes: <7.0     CBG: Recent Labs  Lab 07/22/21 1629 07/22/21 2005 07/22/21 2352 07/23/21 0336 07/23/21 0711  GLUCAP 337* 220* 193* 179* 167*   Steve Mia Winthrop ACNP Acute Care Nurse Practitioner [place] Please consult Amion 07/23/2021, 10:49 AM  07/23/2021, 10:49 AM

## 2021-07-23 NOTE — Progress Notes (Signed)
PROGRESS NOTE    Derek Owen  WYO:[id number] DOB: June 23, 1966 DOA: 07/18/2021 PCP: Renee Rival, FNP   Chief Complain: Respiratory distress  Brief Narrative: Patient is a 56 year old male with history of tobacco use, diabetes type 2, rheumatoid arthritis, recent history of COVID who presented to the emergency department at [facility] with respiratory status, hypoxic.  She was earlier seen in the ED on 1/2 and was found to have elevated D-dimer, chest x-ray showed left pleural effusion and was discharged to home with a steroid and albuterol.  On presentation, she had to be put on BiPAP.  Lab work showed elevated BNP.  CTA chest was negative for PE but showed bilateral pleural effusion, left upper abdominal calcified mass.  Lab work also showed leukocytosis.   He underwent bilateral thoracentesis, tap was bloody.  Currently on IV diuresis for volume overload.  Plan for CT kidney, cardiothoracic consult for pleural biopsy and chest tube placement  Assessment & Plan:   Principal Problem:   Acute respiratory failure with hypoxia ([place]) Active Problems:   DM2 (diabetes mellitus, type 2) ([place])   Hypertension associated with diabetes ([place])   Bilateral pleural effusion   Mass of abdomen   Pleural effusion   Acute hypoxic respiratory failure/bilateral pleural effusion: Secondary to bilateral pleural effusion +/- pneumonia. He required BiPAP on presentation.  Underwent bilateral thoracentesis on 1/9 .  Underwent repeat left thoracentesis on 1/10 with removal of minimal bloody fluid.  Cytology does not show malignant cells from earlier thoracentesis,repeat is pending.  Culture did not show any organism.  QuantiFERON TB Gold negative. CT chest also showed bilateral lower lobe opacity suspicious for pneumonia. Patient has history of rheumatoid arthritis, which could be the etiology for bloody pleural fluid but malignancy is a possibility. Recent history of COVID, was taking Paxlovid  and  prednisone. Chest x-ray done today showed reaccumulation of pleural effusion on the right side with loculation, progressive atelectasis.  PCCM consulting cardiothoracic surgery for consideration of pleural biopsy and chest tube placement.  Left upper abdominal calcified mass: CT chest/abdomen/pelvis with contrast showed indeterminate partially calcified mass involving the upper pole of the left kidney. Renal CT has been ordered.  Suspicious for malignancy.  Lower extremity edema: Normal BNP..  2D echo showed normal ejection fraction, asymmetric left ventricular hypertrophy.  Looks significantly volume overloaded with bilateral lower extremity edema.  We will continue Lasix 60 mg IV twice daily  Microcytic anemia: Hemoglobin currently in the range of 8-9.  Iron studies showed low iron.  Given IV iron.  No report of hematochezia or melena or acute blood loss.  CT abdomen/pelvis did not show any source of potential bleed. No history of EGD and colonoscopy in the past. Continue oral iron supplementation on DC  Type 2 diabetes: Takes oral diabetic medications at home.  Continue current insulin regimen.  Hypertension: Currently BP stable.  Antihypertensives on hold  History of alcohol use: Continue thiamine, folic acid, on CIWA protocol  History of hyperlipidemia: Takes Lipitor at home.  History of rheumatoid arthritis: Takes methotrexate.  We recommend to follow-up with hematology as an outpatient.  Hypokalemia: Being supplemented and monitored.  Obesity: BMI of 42.5  Deconditioning/debility: PT/OT evaluated him, no follow-up recommended            DVT prophylaxis:Lovenox Code Status: Full Family Communication: None at the bedside Patient status:Inpatient  Dispo: The patient is from: Home              Anticipated d/c is  to: Home              Anticipated d/c date HW:EXHBZ complete work up  Consultants: PCCM  Procedures: Thoracentesis  Antimicrobials:  Anti-infectives (From  admission, onward)    Start     Dose/Rate Route Frequency Ordered Stop   07/21/21 1630  cefTRIAXone (ROCEPHIN) 1 g in sodium chloride 0.9 % 100 mL IVPB        1 g 200 mL/hr over 30 Minutes Intravenous Every 24 hours 07/21/21 1540     07/19/21 0330  cefTRIAXone (ROCEPHIN) 1 g in sodium chloride 0.9 % 100 mL IVPB        1 g 200 mL/hr over 30 Minutes Intravenous  Once 07/19/21 0323 07/19/21 0502   07/19/21 0330  azithromycin (ZITHROMAX) 500 mg in sodium chloride 0.9 % 250 mL IVPB        500 mg 250 mL/hr over 60 Minutes Intravenous  Once 07/19/21 0323 07/19/21 1696       Subjective:  Patient seen and examined at the bedside this morning.  Still on nasal cannula for oxygen supplementation.  He said his could not sleep last night.  Looks in mild to moderate respiratory distress with labored breathing.  Objective: Vitals:   07/22/21 1315 07/22/21 2007 07/22/21 2354 07/23/21 0337  BP:  (!) 150/77 132/82 125/87  Pulse: (!) 104 (!) 106 (!) 105 100  Resp: (!) 23 20 20  (!) 21  Temp:  99.9 F (37.7 C) 98.8 F (37.1 C) 98.1 F (36.7 C)  TempSrc:  Oral Oral Oral  SpO2: 93% 100% 98% 93%  Weight:      Height:        Intake/Output Summary (Last 24 hours) at 07/23/2021 0719 Last data filed at 07/23/2021 7893 Gross per 24 hour  Intake 1330 ml  Output 4600 ml  Net -3270 ml   Filed Weights   07/18/21 2049  Weight: (!) 142.4 kg    Examination:   General exam: Morbidly obese, deconditioned, weak HEENT: PERRL Respiratory system: Diminished sounds bilaterally on bases, coarse breath sounds, some crackles on the right side Cardiovascular system: S1 & S2 heard, RRR.  Gastrointestinal system: Abdomen is nondistended, soft and nontender. Central nervous system: Alert and oriented Extremities: Lower extremity pitting edema, no clubbing ,no cyanosis Skin: No rashes, no ulcers,no icterus    Data Reviewed: I have personally reviewed following labs and imaging studies  CBC: Recent Labs   Lab 07/18/21 2045 07/18/21 2056 07/19/21 1212 07/20/21 0500 07/21/21 0101 07/22/21 0136 07/23/21 0110  WBC 12.6*  --  10.9* 17.9* 13.7* 9.1 10.4  NEUTROABS 10.2*  --   --   --  10.8*  --  7.7  HGB 10.8*   < > 9.7* 8.9* 8.9* 8.1* 8.6*  HCT 34.6*   < > 31.5* 28.4* 28.1* 25.7* 27.8*  MCV 75.7*  --  75.7* 76.8* 75.1* 74.7* 75.5*  PLT 203  --  204 203 204 192 237   < > = values in this interval not displayed.   Basic Metabolic Panel: Recent Labs  Lab 07/19/21 1212 07/20/21 0500 07/21/21 0101 07/22/21 0136 07/23/21 0110  NA 132* 135 132* 133* 134*  K 4.1 4.3 3.9 3.2* 3.3*  CL 95* 97* 93* 95* 92*  CO2 27 29 31 30  32  GLUCOSE 284* 226* 160* 206* 220*  BUN 14 20 23* 18 14  CREATININE 1.06 1.25* 1.16 0.95 1.00  CALCIUM 8.1* 8.3* 8.4* 7.8* 8.0*  PHOS  --   --  3.7  --   --    GFR: Estimated Creatinine Clearance: 120.8 mL/min (by C-G formula based on SCr of 1 mg/dL). Liver Function Tests: Recent Labs  Lab 07/18/21 2045 07/19/21 1212  AST 22  --   ALT 18  --   ALKPHOS 78  --   BILITOT 1.1  --   PROT 6.7 6.4*  ALBUMIN 3.4*  --    No results for input(s): LIPASE, AMYLASE in the last 168 hours. No results for input(s): AMMONIA in the last 168 hours. Coagulation Profile: No results for input(s): INR, PROTIME in the last 168 hours. Cardiac Enzymes: No results for input(s): CKTOTAL, CKMB, CKMBINDEX, TROPONINI in the last 168 hours. BNP (last 3 results) No results for input(s): PROBNP in the last 8760 hours. HbA1C: No results for input(s): HGBA1C in the last 72 hours.  CBG: Recent Labs  Lab 07/22/21 1209 07/22/21 1629 07/22/21 2005 07/22/21 2352 07/23/21 0336  GLUCAP 266* 337* 220* 193* 179*   Lipid Profile: No results for input(s): CHOL, HDL, LDLCALC, TRIG, CHOLHDL, LDLDIRECT in the last 72 hours. Thyroid Function Tests: No results for input(s): TSH, T4TOTAL, FREET4, T3FREE, THYROIDAB in the last 72 hours. Anemia Panel: Recent Labs    07/20/21 1418  07/20/21 1420  VITAMINB12  --  333  FOLATE  --  21.0  FERRITIN 277  --   TIBC  --  318  IRON  --  26*  RETICCTPCT  --  2.4   Sepsis Labs: Recent Labs  Lab 07/19/21 0350 07/19/21 0810  PROCALCITON <0.10  --   LATICACIDVEN 2.0* 1.9    Recent Results (from the past 240 hour(s))  Resp Panel by RT-PCR (Flu A&B, Covid) Nasopharyngeal Swab     Status: None   Collection Time: 07/18/21  8:48 PM   Specimen: Nasopharyngeal Swab; Nasopharyngeal(NP) swabs in vial transport medium  Result Value Ref Range Status   SARS Coronavirus 2 by RT PCR NEGATIVE NEGATIVE Final    Comment: (NOTE) SARS-CoV-2 target nucleic acids are NOT DETECTED.  The SARS-CoV-2 RNA is generally detectable in upper respiratory specimens during the acute phase of infection. The lowest concentration of SARS-CoV-2 viral copies this assay can detect is 138 copies/mL. A negative result does not preclude SARS-Cov-2 infection and should not be used as the sole basis for treatment or other patient management decisions. A negative result may occur with  improper specimen collection/handling, submission of specimen other than nasopharyngeal swab, presence of viral mutation(s) within the areas targeted by this assay, and inadequate number of viral copies(<138 copies/mL). A negative result must be combined with clinical observations, patient history, and epidemiological information. The expected result is Negative.  Fact Sheet for Patients:  EntrepreneurPulse.com.au  Fact Sheet for Healthcare Providers:  IncredibleEmployment.be  This test is no t yet approved or cleared by the [place] FDA and  has been authorized for detection and/or diagnosis of SARS-CoV-2 by FDA under an Emergency Use Authorization (EUA). This EUA will remain  in effect (meaning this test can be used) for the duration of the COVID-19 declaration under Section 564(b)(1) of the Act, 21 U.S.C.section 360bbb-3(b)(1),  unless the authorization is terminated  or revoked sooner.       Influenza A by PCR NEGATIVE NEGATIVE Final   Influenza B by PCR NEGATIVE NEGATIVE Final    Comment: (NOTE) The Xpert Xpress SARS-CoV-2/FLU/RSV plus assay is intended as an aid in the diagnosis of influenza from Nasopharyngeal swab specimens and should not be used as a sole  basis for treatment. Nasal washings and aspirates are unacceptable for Xpert Xpress SARS-CoV-2/FLU/RSV testing.  Fact Sheet for Patients: EntrepreneurPulse.com.au  Fact Sheet for Healthcare Providers: IncredibleEmployment.be  This test is not yet approved or cleared by the [place] FDA and has been authorized for detection and/or diagnosis of SARS-CoV-2 by FDA under an Emergency Use Authorization (EUA). This EUA will remain in effect (meaning this test can be used) for the duration of the COVID-19 declaration under Section 564(b)(1) of the Act, 21 U.S.C. section 360bbb-3(b)(1), unless the authorization is terminated or revoked.  Performed at [facility], [place] [address] [place], [place] [address]   Body fluid culture w Gram Stain     Status: None   Collection Time: 07/19/21 11:02 AM   Specimen: Pleural Fluid  Result Value Ref Range Status   Specimen Description PLEURAL FLUID  Final   Special Requests LEFT  Final   Gram Stain   Final    ABUNDANT WBC PRESENT,BOTH PMN AND MONONUCLEAR NO ORGANISMS SEEN    Culture   Final    NO GROWTH Performed at [facility], [place] [address], [place], [place] [address]    Report Status 07/22/2021 FINAL  Final  Body fluid culture w Gram Stain     Status: None (Preliminary result)   Collection Time: 07/19/21  3:25 PM   Specimen: Pleural Fluid  Result Value Ref Range Status   Specimen Description FLUID PLEURAL  Final   Special Requests NONE  Final   Gram Stain   Final    RARE WBC PRESENT,BOTH PMN AND MONONUCLEAR NO ORGANISMS SEEN    Culture   Final     NO GROWTH 3 DAYS Performed at [facility], [place] [address], [place], [place] [address]    Report Status PENDING  Incomplete  MRSA Next Gen by PCR, Nasal     Status: None   Collection Time: 07/20/21  2:58 PM   Specimen: Nasal Mucosa; Nasal Swab  Result Value Ref Range Status   MRSA by PCR Next Gen NOT DETECTED NOT DETECTED Final    Comment: (NOTE) The GeneXpert MRSA Assay (FDA approved for NASAL specimens only), is one component of a comprehensive MRSA colonization surveillance program. It is not intended to diagnose MRSA infection nor to guide or monitor treatment for MRSA infections. Test performance is not FDA approved in patients less than 5 years old. Performed at [facility], [place] [address], [place], [place] [address]          Radiology Studies: CT CHEST ABDOMEN PELVIS W CONTRAST  Result Date: 07/21/2021 CLINICAL DATA:  Anemia. Partially imaged rim calcified mass in the left upper quadrant of the abdomen on prior chest CT. EXAM: CT CHEST, ABDOMEN, AND PELVIS WITH CONTRAST TECHNIQUE: Multidetector CT imaging of the chest, abdomen and pelvis was performed following the standard protocol during bolus administration of intravenous contrast. RADIATION DOSE REDUCTION: This exam was performed according to the departmental dose-optimization program which includes automated exposure control, adjustment of the mA and/or kV according to patient size and/or use of iterative reconstruction technique. CONTRAST:  135mL OMNIPAQUE IOHEXOL 300 MG/ML  SOLN COMPARISON:  Chest CTA 07/19/2021. No other relevant comparison studies. FINDINGS: CT CHEST FINDINGS Cardiovascular: No acute vascular findings are seen. There is suboptimal opacification of the pulmonary arteries, but no evidence of acute pulmonary embolism. Mild atherosclerosis of the aorta, great vessels and coronary arteries. The heart size is normal. There is no pericardial effusion. Mediastinum/Nodes: There  are no enlarged  mediastinal, hilar or axillary lymph nodes. The thyroid gland, trachea and esophagus demonstrate no significant findings. Lungs/Pleura: Significant improvement in the previously demonstrated large bilateral pleural effusions. Mild residual pleural effusions are present bilaterally. There is persistent dense consolidation in both lower lobes, left greater than right. In addition, there is mild dependent airspace disease within the right middle lobe. The upper lobes are clear. No suspicious pulmonary nodules. Mild centrilobular emphysema. Musculoskeletal/Chest wall: No chest wall mass or suspicious osseous findings. Mild thoracic spondylosis. CT ABDOMEN AND PELVIS FINDINGS Hepatobiliary: Subjective mild hepatic steatosis without focal abnormality or abnormal enhancement. No evidence of gallstones, gallbladder wall thickening or biliary dilatation. Pancreas: Unremarkable. No pancreatic ductal dilatation or surrounding inflammatory changes. Spleen: Normal in size without focal abnormality. Adrenals/Urinary Tract: The right adrenal gland appears normal. There is splaying of the limbs of the left adrenal gland by the partially calcified left renal mass, further described below. No adrenal mass identified. There is a large peripherally calcified mass involving the upper pole of the left kidney, measuring up to 11.1 x 10.7 cm transverse on image 67/3. This measures up to 10.7 cm craniocaudal on coronal image 115/5. In addition to peripheral calcifications, there are speckled internal calcifications within this lesion which is incompletely characterized. No other renal masses are identified. The right kidney appears normal. There is no hydronephrosis. The bladder appears unremarkable. Stomach/Bowel: Enteric contrast was administered and has passed into the mid colon. The stomach appears unremarkable for its degree of distension. No evidence of bowel wall thickening, distention or surrounding inflammatory change. The appendix  appears normal. Vascular/Lymphatic: There are no enlarged abdominal or pelvic lymph nodes. Mild aortic and branch vessel atherosclerosis without acute vascular findings or aneurysm. The portal, superior mesenteric and splenic veins are patent. The renal veins and IVC appear normal. Reproductive: Mild enlargement of the prostate gland. The seminal vesicles appear unremarkable. Other: Small umbilical hernia containing only fat.  No ascites. Musculoskeletal: No acute or significant osseous findings. Multilevel spondylosis contributing to biforaminal narrowing at L4-5 and L5-S1. IMPRESSION: 1. Interval significant improvement in previously demonstrated bilateral pleural effusions. There is persistent lower lobe consolidation bilaterally suspicious for pneumonia. Continued radiographic follow-up recommended. 2. Indeterminate partially calcified mass involving the upper pole of the left kidney. This is not clearly cystic by single-phase CT and demonstrates internal calcifications. Further evaluation recommended to exclude neoplasm. Optimally, this would be evaluated with dedicated renal MRI (without and with contrast) when the patient is clinically stable and able to follow directions and hold their breath (preferably as an outpatient). If unable to tolerate MRI, dedicated renal CT (pre and post-contrast imaging) could be performed. 3. Coronary and Aortic Atherosclerosis (ICD10-I70.0). Electronically Signed   By: Richardean Sale M.D.   On: 07/21/2021 14:42   DG CHEST PORT 1 VIEW  Result Date: 07/22/2021 CLINICAL DATA:  Evaluate lungs EXAM: PORTABLE CHEST 1 VIEW COMPARISON:  Chest x-ray dated July 21, 2021 FINDINGS: Cardiac and mediastinal contours are unchanged. Moderate loculated right pleural effusion, increased in size when compared with prior exam. Bibasilar opacities. No evidence of pneumothorax. IMPRESSION: 1. Moderate loculated right pleural effusion, increased in size when compared to prior exam. 2.  Bibasilar opacities, concerning for infection or aspiration. Electronically Signed   By: Yetta Glassman M.D.   On: 07/22/2021 10:51   DG CHEST PORT 1 VIEW  Result Date: 07/21/2021 CLINICAL DATA:  Status post thoracentesis. EXAM: PORTABLE CHEST 1 VIEW COMPARISON:  Chest radiograph 07/21/2021 at 6:40 a.m. FINDINGS: The  cardiomediastinal silhouette is unchanged with normal heart size. There is a small right pleural effusion, decreased following interval thoracentesis. A small left-sided pleural effusion remains. There are patchy bibasilar airspace opacities, unchanged on the left and with improved aeration of the right lung base following thoracentesis. No pneumothorax is identified. IMPRESSION: 1. Decreased right pleural effusion following thoracentesis. No pneumothorax. 2. Persistent small bilateral. Pleural effusions and bibasilar atelectasis or infiltrates Electronically Signed   By: Logan Bores M.D.   On: 07/21/2021 10:27        Scheduled Meds:  enoxaparin (LOVENOX) injection  60 mg Subcutaneous S28J   folic acid  1 mg Oral Daily   furosemide  60 mg Intravenous BID   insulin aspart  0-9 Units Subcutaneous Q4H   insulin aspart  5 Units Subcutaneous TID WC   insulin glargine-yfgn  30 Units Subcutaneous Daily   mouth rinse  15 mL Mouth Rinse BID   multivitamin with minerals  1 tablet Oral Daily   potassium chloride  40 mEq Oral Daily   thiamine  100 mg Oral Daily   Or   thiamine  100 mg Intravenous Daily   Continuous Infusions:  cefTRIAXone (ROCEPHIN)  IV Stopped (07/22/21 1707)   ferric gluconate (FERRLECIT) IVPB Stopped (07/22/21 1250)     LOS: 4 days    Time spent: 35 mins.More than 50% of that time was spent in counseling and/or coordination of care.      Shelly Coss, MD [facility] P1/13/2023, 7:19 AM

## 2021-07-24 ENCOUNTER — Inpatient Hospital Stay (HOSPITAL_COMMUNITY): Payer: Managed Care, Other (non HMO)

## 2021-07-24 DIAGNOSIS — J9601 Acute respiratory failure with hypoxia: Secondary | ICD-10-CM | POA: Diagnosis not present

## 2021-07-24 LAB — GLUCOSE, CAPILLARY
Glucose-Capillary: 158 mg/dL — ABNORMAL HIGH (ref 70–99)
Glucose-Capillary: 238 mg/dL — ABNORMAL HIGH (ref 70–99)
Glucose-Capillary: 197 mg/dL — ABNORMAL HIGH (ref 70–99)
Glucose-Capillary: 246 mg/dL — ABNORMAL HIGH (ref 70–99)
Glucose-Capillary: 258 mg/dL — ABNORMAL HIGH (ref 70–99)

## 2021-07-24 LAB — PHOSPHORUS: Phosphorus: 3.7 mg/dL (ref 2.5–4.6)

## 2021-07-24 LAB — BASIC METABOLIC PANEL
Anion gap: 9 (ref 5–15)
BUN: 14 mg/dL (ref 6–20)
CO2: 34 mmol/L — ABNORMAL HIGH (ref 22–32)
Calcium: 8.6 mg/dL — ABNORMAL LOW (ref 8.9–10.3)
Chloride: 94 mmol/L — ABNORMAL LOW (ref 98–111)
Creatinine, Ser: 0.98 mg/dL (ref 0.61–1.24)
GFR, Estimated: >60 mL/min (ref >60)
Glucose, Bld: 152 mg/dL — ABNORMAL HIGH (ref 70–99)
Potassium: 3.5 mmol/L (ref 3.5–5.1)
Sodium: 137 mmol/L (ref 135–145)

## 2021-07-24 LAB — CBC WITH DIFFERENTIAL/PLATELET
Abs Immature Granulocytes: 0.17 10*3/uL — ABNORMAL HIGH (ref 0.00–0.07)
Basophils Absolute: 0 10*3/uL (ref 0.0–0.1)
Basophils Relative: 0 %
Eosinophils Absolute: 0.1 10*3/uL (ref 0.0–0.5)
Eosinophils Relative: 1 %
HCT: 28 % — ABNORMAL LOW (ref 39.0–52.0)
Hemoglobin: 8.7 g/dL — ABNORMAL LOW (ref 13.0–17.0)
Immature Granulocytes: 1 %
Lymphocytes Relative: 8 %
Lymphs Abs: 0.9 10*3/uL (ref 0.7–4.0)
MCH: 23.1 pg — ABNORMAL LOW (ref 26.0–34.0)
MCHC: 31.1 g/dL (ref 30.0–36.0)
MCV: 74.5 fL — ABNORMAL LOW (ref 80.0–100.0)
Monocytes Absolute: 1.7 10*3/uL — ABNORMAL HIGH (ref 0.1–1.0)
Monocytes Relative: 14 %
Neutro Abs: 9.3 10*3/uL — ABNORMAL HIGH (ref 1.7–7.7)
Neutrophils Relative %: 76 %
Platelets: 295 10*3/uL (ref 150–400)
RBC: 3.76 MIL/uL — ABNORMAL LOW (ref 4.22–5.81)
RDW: 17.6 % — ABNORMAL HIGH (ref 11.5–15.5)
WBC: 12.3 10*3/uL — ABNORMAL HIGH (ref 4.0–10.5)
nRBC: 0 % (ref 0.0–0.2)

## 2021-07-24 LAB — MAGNESIUM: Magnesium: 2 mg/dL (ref 1.7–2.4)

## 2021-07-24 NOTE — Progress Notes (Addendum)
PROGRESS NOTE    Derek Owen  GLO:[id number] DOB: 1965/09/28 DOA: 07/18/2021 PCP: Renee Rival, FNP   Chief Complain: Respiratory distress  Brief Narrative: Patient is a 56 year old male with history of tobacco use, diabetes type 2, rheumatoid arthritis, recent history of COVID who presented to the emergency department at [facility] with respiratory status, hypoxic.  She was earlier seen in the ED on 1/2 and was found to have elevated D-dimer, chest x-ray showed left pleural effusion and was discharged to home with a steroid and albuterol.  On presentation, she had to be put on BiPAP.  Lab work showed elevated BNP.  CTA chest was negative for PE but showed bilateral pleural effusion, left upper abdominal calcified mass.  Lab work also showed leukocytosis.   He underwent bilateral thoracentesis, tap was bloody.  Currently on IV diuresis for volume overload.  Plan for CT kidney, cardiothoracic consult for pleural biopsy and chest tube placement  Assessment & Plan:   Principal Problem:   Acute respiratory failure with hypoxia ([place]) Active Problems:   DM2 (diabetes mellitus, type 2) ([place])   Hypertension associated with diabetes ([place])   Bilateral pleural effusion   Mass of abdomen   Pleural effusion   Acute hypoxic respiratory failure/bilateral pleural effusion: Secondary to bilateral pleural effusion +/- pneumonia. He required BiPAP on presentation.  Underwent bilateral thoracentesis on 1/9 .  Underwent repeat left thoracentesis on 1/10 with removal of minimal bloody fluid.  Cytology does not show malignant cells from earlier thoracentesis,repeat is pending.  Culture did not show any organism.  QuantiFERON TB Gold negative. CT chest also showed bilateral lower lobe opacity suspicious for pneumonia. Patient has history of rheumatoid arthritis, which could be the etiology for bloody pleural fluid but malignancy is a possibility. Recent history of COVID, was taking Paxlovid  and  prednisone. Chest x-ray done today showed reaccumulation of pleural effusion on the right side with loculation, progressive atelectasis.  PCCM consulting cardiothoracic surgery for consideration of pleural biopsy and chest tube placement.  Left upper abdominal calcified mass: CT chest/abdomen/pelvis with contrast showed indeterminate partially calcified mass involving the upper pole of the left kidney. Renal CT has been ordered.  Suspicious for malignancy.  Lower extremity edema: Normal BNP..  2D echo showed normal ejection fraction, asymmetric left ventricular hypertrophy.  Looks significantly volume overloaded with bilateral lower extremity edema.  We will continue Lasix 60 mg IV twice daily  Microcytic anemia: Hemoglobin currently in the range of 8-9.  Iron studies showed low iron.  Given IV iron.  No report of hematochezia or melena or acute blood loss.  CT abdomen/pelvis did not show any source of potential bleed. No history of EGD and colonoscopy in the past. Continue oral iron supplementation on DC  Type 2 diabetes: Takes oral diabetic medications at home.  Continue current insulin regimen.  Hypertension: Currently BP stable.  Antihypertensives on hold  History of alcohol use: Continue thiamine, folic acid, on CIWA protocol  History of hyperlipidemia: Takes Lipitor at home.  History of rheumatoid arthritis: Takes methotrexate.  We recommend to follow-up with hematology as an outpatient.  Hypokalemia: Being supplemented and monitored.  Obesity: BMI of 42.5  Deconditioning/debility: PT/OT evaluated him, no follow-up recommended            DVT prophylaxis:Lovenox Code Status: Full Family Communication: None at the bedside Patient status:Inpatient  Dispo: The patient is from: Home              Anticipated d/c is  to: Home              Anticipated d/c date WF:UXNAT complete work up  Consultants: PCCM  Procedures: Thoracentesis  Antimicrobials:  Anti-infectives (From  admission, onward)    Start     Dose/Rate Route Frequency Ordered Stop   07/21/21 1630  cefTRIAXone (ROCEPHIN) 1 g in sodium chloride 0.9 % 100 mL IVPB        1 g 200 mL/hr over 30 Minutes Intravenous Every 24 hours 07/21/21 1540     07/19/21 0330  cefTRIAXone (ROCEPHIN) 1 g in sodium chloride 0.9 % 100 mL IVPB        1 g 200 mL/hr over 30 Minutes Intravenous  Once 07/19/21 0323 07/19/21 0502   07/19/21 0330  azithromycin (ZITHROMAX) 500 mg in sodium chloride 0.9 % 250 mL IVPB        500 mg 250 mL/hr over 60 Minutes Intravenous  Once 07/19/21 0323 07/19/21 5573       Subjective:  Patient seen and examined at the bedside this morning.  Hemodynamically stable but very weak, lying in bed.  Denies any new complaints today.  Becomes dyspneic after minimal exertion.  Objective: Vitals:   07/23/21 1625 07/23/21 1958 07/23/21 2338 07/24/21 0351  BP:  117/73 106/74 126/77  Pulse: (!) 101 (!) 110 95 97  Resp: 16 17 19 18   Temp:  99.4 F (37.4 C) 98.9 F (37.2 C) 98.6 F (37 C)  TempSrc:  Oral Oral Oral  SpO2: 93% 92% 94% 96%  Weight:      Height:        Intake/Output Summary (Last 24 hours) at 07/24/2021 0728 Last data filed at 07/24/2021 0352 Gross per 24 hour  Intake 960 ml  Output 3600 ml  Net -2640 ml   Filed Weights   07/18/21 2049  Weight: (!) 142.4 kg    Examination:   General exam: Morbidly obese, weak, deconditioned HEENT: PERRL Respiratory system: Diminished air sounds bilaterally, Cardiovascular system: S1 & S2 heard, RRR.  Gastrointestinal system: Abdomen is nondistended, soft and nontender. Central nervous system: Alert and oriented Extremities: Lower extremity edema, no clubbing ,no cyanosis Skin: No rashes, no ulcers,no icterus    Data Reviewed: I have personally reviewed following labs and imaging studies  CBC: Recent Labs  Lab 07/18/21 2045 07/18/21 2056 07/20/21 0500 07/21/21 0101 07/22/21 0136 07/23/21 0110 07/24/21 0109  WBC 12.6*   < >  17.9* 13.7* 9.1 10.4 12.3*  NEUTROABS 10.2*  --   --  10.8*  --  7.7 9.3*  HGB 10.8*   < > 8.9* 8.9* 8.1* 8.6* 8.7*  HCT 34.6*   < > 28.4* 28.1* 25.7* 27.8* 28.0*  MCV 75.7*   < > 76.8* 75.1* 74.7* 75.5* 74.5*  PLT 203   < > 203 204 192 237 295   < > = values in this interval not displayed.   Basic Metabolic Panel: Recent Labs  Lab 07/20/21 0500 07/21/21 0101 07/22/21 0136 07/23/21 0110 07/24/21 0109  NA 135 132* 133* 134* 137  K 4.3 3.9 3.2* 3.3* 3.5  CL 97* 93* 95* 92* 94*  CO2 29 31 30  32 34*  GLUCOSE 226* 160* 206* 220* 152*  BUN 20 23* 18 14 14   CREATININE 1.25* 1.16 0.95 1.00 0.98  CALCIUM 8.3* 8.4* 7.8* 8.0* 8.6*  MG  --   --   --   --  2.0  PHOS  --  3.7  --   --  3.7  GFR: Estimated Creatinine Clearance: 123.2 mL/min (by C-G formula based on SCr of 0.98 mg/dL). Liver Function Tests: Recent Labs  Lab 07/18/21 2045 07/19/21 1212  AST 22  --   ALT 18  --   ALKPHOS 78  --   BILITOT 1.1  --   PROT 6.7 6.4*  ALBUMIN 3.4*  --    No results for input(s): LIPASE, AMYLASE in the last 168 hours. No results for input(s): AMMONIA in the last 168 hours. Coagulation Profile: No results for input(s): INR, PROTIME in the last 168 hours. Cardiac Enzymes: No results for input(s): CKTOTAL, CKMB, CKMBINDEX, TROPONINI in the last 168 hours. BNP (last 3 results) No results for input(s): PROBNP in the last 8760 hours. HbA1C: No results for input(s): HGBA1C in the last 72 hours.  CBG: Recent Labs  Lab 07/23/21 1115 07/23/21 1616 07/23/21 1955 07/23/21 2337 07/24/21 0349  GLUCAP 249* 198* 297* 205* 158*   Lipid Profile: No results for input(s): CHOL, HDL, LDLCALC, TRIG, CHOLHDL, LDLDIRECT in the last 72 hours. Thyroid Function Tests: No results for input(s): TSH, T4TOTAL, FREET4, T3FREE, THYROIDAB in the last 72 hours. Anemia Panel: No results for input(s): VITAMINB12, FOLATE, FERRITIN, TIBC, IRON, RETICCTPCT in the last 72 hours.  Sepsis Labs: Recent Labs  Lab  07/19/21 0350 07/19/21 0810  PROCALCITON <0.10  --   LATICACIDVEN 2.0* 1.9    Recent Results (from the past 240 hour(s))  Resp Panel by RT-PCR (Flu A&B, Covid) Nasopharyngeal Swab     Status: None   Collection Time: 07/18/21  8:48 PM   Specimen: Nasopharyngeal Swab; Nasopharyngeal(NP) swabs in vial transport medium  Result Value Ref Range Status   SARS Coronavirus 2 by RT PCR NEGATIVE NEGATIVE Final    Comment: (NOTE) SARS-CoV-2 target nucleic acids are NOT DETECTED.  The SARS-CoV-2 RNA is generally detectable in upper respiratory specimens during the acute phase of infection. The lowest concentration of SARS-CoV-2 viral copies this assay can detect is 138 copies/mL. A negative result does not preclude SARS-Cov-2 infection and should not be used as the sole basis for treatment or other patient management decisions. A negative result may occur with  improper specimen collection/handling, submission of specimen other than nasopharyngeal swab, presence of viral mutation(s) within the areas targeted by this assay, and inadequate number of viral copies(<138 copies/mL). A negative result must be combined with clinical observations, patient history, and epidemiological information. The expected result is Negative.  Fact Sheet for Patients:  EntrepreneurPulse.com.au  Fact Sheet for Healthcare Providers:  IncredibleEmployment.be  This test is no t yet approved or cleared by the [place] FDA and  has been authorized for detection and/or diagnosis of SARS-CoV-2 by FDA under an Emergency Use Authorization (EUA). This EUA will remain  in effect (meaning this test can be used) for the duration of the COVID-19 declaration under Section 564(b)(1) of the Act, 21 U.S.C.section 360bbb-3(b)(1), unless the authorization is terminated  or revoked sooner.       Influenza A by PCR NEGATIVE NEGATIVE Final   Influenza B by PCR NEGATIVE NEGATIVE Final     Comment: (NOTE) The Xpert Xpress SARS-CoV-2/FLU/RSV plus assay is intended as an aid in the diagnosis of influenza from Nasopharyngeal swab specimens and should not be used as a sole basis for treatment. Nasal washings and aspirates are unacceptable for Xpert Xpress SARS-CoV-2/FLU/RSV testing.  Fact Sheet for Patients: EntrepreneurPulse.com.au  Fact Sheet for Healthcare Providers: IncredibleEmployment.be  This test is not yet approved or cleared by the [place]  FDA and has been authorized for detection and/or diagnosis of SARS-CoV-2 by FDA under an Emergency Use Authorization (EUA). This EUA will remain in effect (meaning this test can be used) for the duration of the COVID-19 declaration under Section 564(b)(1) of the Act, 21 U.S.C. section 360bbb-3(b)(1), unless the authorization is terminated or revoked.  Performed at [facility], [place] [address], [place], [place] [address]   Body fluid culture w Gram Stain     Status: None   Collection Time: 07/19/21 11:02 AM   Specimen: Pleural Fluid  Result Value Ref Range Status   Specimen Description PLEURAL FLUID  Final   Special Requests LEFT  Final   Gram Stain   Final    ABUNDANT WBC PRESENT,BOTH PMN AND MONONUCLEAR NO ORGANISMS SEEN    Culture   Final    NO GROWTH Performed at [facility], [place] [address] [place], [place] [address]    Report Status 07/22/2021 FINAL  Final  Body fluid culture w Gram Stain     Status: None   Collection Time: 07/19/21  3:25 PM   Specimen: Pleural Fluid  Result Value Ref Range Status   Specimen Description FLUID PLEURAL  Final   Special Requests NONE  Final   Gram Stain   Final    RARE WBC PRESENT,BOTH PMN AND MONONUCLEAR NO ORGANISMS SEEN    Culture   Final    NO GROWTH 3 DAYS Performed at [facility], [place] [address] [place], [place] [address]    Report Status 07/23/2021 FINAL  Final  MRSA Next Gen by PCR, Nasal     Status:  None   Collection Time: 07/20/21  2:58 PM   Specimen: Nasal Mucosa; Nasal Swab  Result Value Ref Range Status   MRSA by PCR Next Gen NOT DETECTED NOT DETECTED Final    Comment: (NOTE) The GeneXpert MRSA Assay (FDA approved for NASAL specimens only), is one component of a comprehensive MRSA colonization surveillance program. It is not intended to diagnose MRSA infection nor to guide or monitor treatment for MRSA infections. Test performance is not FDA approved in patients less than 63 years old. Performed at [facility], [place] [address], [place], [place] [address]          Radiology Studies: DG CHEST PORT 1 VIEW  Result Date: 07/23/2021 CLINICAL DATA:  Pleural effusion. Shortness of breath. Respiratory distress. EXAM: PORTABLE CHEST 1 VIEW COMPARISON:  07/22/2021; 07/21/2021; 07/20/2021; CT of the chest, abdomen and pelvis 07/21/2021 FINDINGS: Grossly unchanged enlarged cardiac silhouette and mediastinal contours. Interval increase in now large loculated right-sided pleural effusion with associated progressive compressive atelectasis/collapse without definitive mediastinal shift. Unchanged left basilar heterogeneous consolidative opacities. No definite left-sided pleural effusion. No evidence of edema. No pneumothorax. No acute osseous abnormalities. Degenerative change of the bilateral acromioclavicular joints. Stigmata of DISH within thoracic spine. IMPRESSION: 1. Progression of now large loculated right-sided effusion with associated progressive atelectasis/collapse. Further evaluation with repeat contrast-enhanced chest CT could be performed as indicated. 2. Unchanged left basilar heterogeneous consolidative opacities again worrisome for infection or aspiration. These results will be called to the ordering clinician or representative by the Radiologist Assistant, and communication documented in the PACS or [employer]. Electronically Signed   By: Sandi Mariscal M.D.   On: 07/23/2021  07:55   DG CHEST PORT 1 VIEW  Result Date: 07/22/2021 CLINICAL DATA:  Evaluate lungs EXAM: PORTABLE CHEST 1 VIEW COMPARISON:  Chest x-ray dated July 21, 2021 FINDINGS: Cardiac and mediastinal contours are unchanged. Moderate loculated right pleural effusion, increased in size when compared with prior exam. Bibasilar opacities. No evidence of pneumothorax. IMPRESSION: 1. Moderate loculated right pleural effusion, increased in size when compared to prior exam. 2. Bibasilar opacities, concerning for infection or aspiration. Electronically Signed   By: Yetta Glassman M.D.   On: 07/22/2021 10:51   VAS [place] LOWER EXTREMITY VENOUS (DVT)  Result Date: 07/23/2021  Lower Venous DVT Study Patient Name:  CARLSON BELLAND  Date of Exam:   07/23/2021 Medical Rec #: [id number]        Accession #:    [id number] Date of Birth: 10/10/1965        Patient Gender: M Patient Age:   31 years Exam [facility]  [facility] Procedure:      VAS [place] LOWER EXTREMITY VENOUS (DVT) Referring Phys: Noemi Chapel --------------------------------------------------------------------------------  Indications: Edema.  Comparison Study: No previous exams Performing Technologist: Jody Hill RVT, RDMS  Examination Guidelines: A complete evaluation includes B-mode imaging, spectral Doppler, color Doppler, and power Doppler as needed of all accessible portions of each vessel. Bilateral testing is considered an integral part of a complete examination. Limited examinations for reoccurring indications may be performed as noted. The reflux portion of the exam is performed with the patient in reverse Trendelenburg.  +---------+---------------+---------+-----------+----------+-------------------+  RIGHT     Compressibility Phasicity Spontaneity Properties Thrombus Aging       +---------+---------------+---------+-----------+----------+-------------------+  CFV       Full            Yes       Yes                                          +---------+---------------+---------+-----------+----------+-------------------+  SFJ       Full                                                                  +---------+---------------+---------+-----------+----------+-------------------+  FV Prox   Full            Yes       Yes                                         +---------+---------------+---------+-----------+----------+-------------------+  FV Mid    Full            Yes       Yes                    Not well visualized  +---------+---------------+---------+-----------+----------+-------------------+  FV Distal Full            Yes       Yes                    Not well visualized  +---------+---------------+---------+-----------+----------+-------------------+  PFV       Full                                                                  +---------+---------------+---------+-----------+----------+-------------------+  POP       Full            Yes       Yes                                         +---------+---------------+---------+-----------+----------+-------------------+  PTV       Full                                                                  +---------+---------------+---------+-----------+----------+-------------------+  PERO      Full                                                                  +---------+---------------+---------+-----------+----------+-------------------+   +---------+---------------+---------+-----------+----------+-------------------+  LEFT      Compressibility Phasicity Spontaneity Properties Thrombus Aging       +---------+---------------+---------+-----------+----------+-------------------+  CFV       Full            Yes       Yes                                         +---------+---------------+---------+-----------+----------+-------------------+  SFJ       Full                                                                  +---------+---------------+---------+-----------+----------+-------------------+  FV  Prox   Full            Yes       Yes                                         +---------+---------------+---------+-----------+----------+-------------------+  FV Mid    Full            Yes       Yes                    Not well visualized  +---------+---------------+---------+-----------+----------+-------------------+  FV Distal Full            Yes       Yes                    Not well visualized  +---------+---------------+---------+-----------+----------+-------------------+  PFV       Full                                                                  +---------+---------------+---------+-----------+----------+-------------------+  POP       Full            Yes       Yes                                         +---------+---------------+---------+-----------+----------+-------------------+  PTV       Full                                                                  +---------+---------------+---------+-----------+----------+-------------------+  PERO      Full                                                                  +---------+---------------+---------+-----------+----------+-------------------+     Summary: BILATERAL: - No evidence of deep vein thrombosis seen in the lower extremities, bilaterally. - No evidence of superficial venous thrombosis in the lower extremities, bilaterally. - RIGHT: - No cystic structure found in the popliteal fossa.  LEFT: - A cystic structure is found in the popliteal fossa.  *See table(s) above for measurements and observations. Electronically signed by Jamelle Haring on 07/23/2021 at 2:54:28 PM.    Final         Scheduled Meds:  enoxaparin (LOVENOX) injection  60 mg Subcutaneous Y81K   folic acid  1 mg Oral Daily   furosemide  60 mg Intravenous BID   insulin aspart  0-9 Units Subcutaneous Q4H   insulin aspart  5 Units Subcutaneous TID WC   insulin glargine-yfgn  30 Units Subcutaneous Daily   mouth rinse  15 mL Mouth Rinse BID   multivitamin with minerals  1  tablet Oral Daily   potassium chloride  40 mEq Oral Daily   thiamine  100 mg Oral Daily   Or   thiamine  100 mg Intravenous Daily   Continuous Infusions:  cefTRIAXone (ROCEPHIN)  IV 1 g (07/23/21 1633)   ferric gluconate (FERRLECIT) IVPB 250 mg (07/23/21 0918)     LOS: 5 days    Time spent: 35 mins.More than 50% of that time was spent in counseling and/or coordination of care.      Shelly Coss, MD [facility] P1/14/2023, 7:28 AM

## 2021-07-24 NOTE — Plan of Care (Signed)

## 2021-07-25 ENCOUNTER — Inpatient Hospital Stay (HOSPITAL_COMMUNITY): Payer: Managed Care, Other (non HMO) | Admitting: Registered Nurse

## 2021-07-25 ENCOUNTER — Encounter (HOSPITAL_COMMUNITY)
Admission: EM | Disposition: A | Discharge status: Home / Self Care | Payer: Self-pay | Source: Home / Self Care | Attending: Internal Medicine

## 2021-07-25 ENCOUNTER — Inpatient Hospital Stay (HOSPITAL_COMMUNITY): Payer: Managed Care, Other (non HMO)

## 2021-07-25 ENCOUNTER — Encounter (HOSPITAL_COMMUNITY): Payer: Self-pay | Admitting: Internal Medicine

## 2021-07-25 DIAGNOSIS — J9601 Acute respiratory failure with hypoxia: Secondary | ICD-10-CM | POA: Diagnosis not present

## 2021-07-25 DIAGNOSIS — J90 Pleural effusion, not elsewhere classified: Secondary | ICD-10-CM | POA: Diagnosis not present

## 2021-07-25 HISTORY — PX: VIDEO ASSISTED THORACOSCOPY (VATS)/EMPYEMA: SHX6172

## 2021-07-25 HISTORY — PX: PLEURAL EFFUSION DRAINAGE: SHX5099

## 2021-07-25 HISTORY — PX: PLEURAL BIOPSY: SHX5082

## 2021-07-25 LAB — CBC WITH DIFFERENTIAL/PLATELET
Abs Immature Granulocytes: 0.17 10*3/uL — ABNORMAL HIGH (ref 0.00–0.07)
Basophils Absolute: 0 10*3/uL (ref 0.0–0.1)
Basophils Relative: 0 %
Eosinophils Absolute: 0.2 10*3/uL (ref 0.0–0.5)
Eosinophils Relative: 1 %
HCT: 27.3 % — ABNORMAL LOW (ref 39.0–52.0)
Hemoglobin: 8.5 g/dL — ABNORMAL LOW (ref 13.0–17.0)
Immature Granulocytes: 1 %
Lymphocytes Relative: 8 %
Lymphs Abs: 1 10*3/uL (ref 0.7–4.0)
MCH: 23.3 pg — ABNORMAL LOW (ref 26.0–34.0)
MCHC: 31.1 g/dL (ref 30.0–36.0)
MCV: 74.8 fL — ABNORMAL LOW (ref 80.0–100.0)
Monocytes Absolute: 1.8 10*3/uL — ABNORMAL HIGH (ref 0.1–1.0)
Monocytes Relative: 15 %
Neutro Abs: 9.3 10*3/uL — ABNORMAL HIGH (ref 1.7–7.7)
Neutrophils Relative %: 75 %
Platelets: 322 10*3/uL (ref 150–400)
RBC: 3.65 MIL/uL — ABNORMAL LOW (ref 4.22–5.81)
RDW: 17.5 % — ABNORMAL HIGH (ref 11.5–15.5)
WBC: 12.5 10*3/uL — ABNORMAL HIGH (ref 4.0–10.5)
nRBC: 0 % (ref 0.0–0.2)

## 2021-07-25 LAB — TYPE AND SCREEN
ABO/RH(D): A POS
Antibody Screen: NEGATIVE

## 2021-07-25 LAB — BASIC METABOLIC PANEL
Anion gap: 10 (ref 5–15)
BUN: 14 mg/dL (ref 6–20)
CO2: 34 mmol/L — ABNORMAL HIGH (ref 22–32)
Calcium: 8.6 mg/dL — ABNORMAL LOW (ref 8.9–10.3)
Chloride: 93 mmol/L — ABNORMAL LOW (ref 98–111)
Creatinine, Ser: 0.97 mg/dL (ref 0.61–1.24)
GFR, Estimated: >60 mL/min (ref >60)
Glucose, Bld: 98 mg/dL (ref 70–99)
Potassium: 3.5 mmol/L (ref 3.5–5.1)
Sodium: 137 mmol/L (ref 135–145)

## 2021-07-25 LAB — CHOLESTEROL, BODY FLUID: Cholesterol, Fluid: 150 mg/dL

## 2021-07-25 LAB — GLUCOSE, CAPILLARY
Glucose-Capillary: 105 mg/dL — ABNORMAL HIGH (ref 70–99)
Glucose-Capillary: 117 mg/dL — ABNORMAL HIGH (ref 70–99)
Glucose-Capillary: 128 mg/dL — ABNORMAL HIGH (ref 70–99)
Glucose-Capillary: 144 mg/dL — ABNORMAL HIGH (ref 70–99)
Glucose-Capillary: 175 mg/dL — ABNORMAL HIGH (ref 70–99)
Glucose-Capillary: 307 mg/dL — ABNORMAL HIGH (ref 70–99)
Glucose-Capillary: 381 mg/dL — ABNORMAL HIGH (ref 70–99)
Glucose-Capillary: 383 mg/dL — ABNORMAL HIGH (ref 70–99)

## 2021-07-25 LAB — ABO/RH: ABO/RH(D): A POS

## 2021-07-25 SURGERY — DRAINAGE, PLEURAL EFFUSION
Anesthesia: General | Site: Chest | Laterality: Right

## 2021-07-25 MED ORDER — FENTANYL CITRATE (PF) 250 MCG/5ML IJ SOLN
INTRAMUSCULAR | Status: AC
  Filled 2021-07-25: qty 5

## 2021-07-25 MED ORDER — ACETAMINOPHEN 500 MG PO TABS
1000.0000 mg | ORAL_TABLET | Freq: Four times a day (QID) | ORAL | Status: AC
  Administered 2021-07-25 – 2021-07-29 (×12): 1000 mg via ORAL
  Filled 2021-07-25 (×14): qty 2

## 2021-07-25 MED ORDER — ACETAMINOPHEN 160 MG/5ML PO SOLN: 1000.0000 mg | Freq: Four times a day (QID) | ORAL | Status: AC

## 2021-07-25 MED ORDER — METOCLOPRAMIDE HCL 5 MG/ML IJ SOLN
10.0000 mg | Freq: Four times a day (QID) | INTRAMUSCULAR | Status: AC
  Administered 2021-07-25 – 2021-07-26 (×4): 10 mg via INTRAVENOUS
  Filled 2021-07-25 (×3): qty 2

## 2021-07-25 MED ORDER — DEXTROSE-NACL 5-0.9 % IV SOLN: INTRAVENOUS | Status: DC

## 2021-07-25 MED ORDER — LIDOCAINE 2% (20 MG/ML) 5 ML SYRINGE
INTRAMUSCULAR | Status: DC | PRN
  Administered 2021-07-25: 100 mg via INTRAVENOUS

## 2021-07-25 MED ORDER — BISACODYL 5 MG PO TBEC
10.0000 mg | DELAYED_RELEASE_TABLET | Freq: Every day | ORAL | Status: DC
  Administered 2021-07-26 – 2021-07-30 (×2): 10 mg via ORAL
  Filled 2021-07-25 (×3): qty 2

## 2021-07-25 MED ORDER — OXYCODONE HCL 5 MG PO TABS
5.0000 mg | ORAL_TABLET | ORAL | Status: DC | PRN
  Administered 2021-07-27: 10 mg via ORAL
  Filled 2021-07-25 (×2): qty 2

## 2021-07-25 MED ORDER — 0.9 % SODIUM CHLORIDE (POUR BTL) OPTIME
TOPICAL | Status: DC | PRN
  Administered 2021-07-25: 2000 mL

## 2021-07-25 MED ORDER — PHENYLEPHRINE 40 MCG/ML (10ML) SYRINGE FOR IV PUSH (FOR BLOOD PRESSURE SUPPORT)
PREFILLED_SYRINGE | INTRAVENOUS | Status: AC
  Filled 2021-07-25: qty 10

## 2021-07-25 MED ORDER — ENOXAPARIN SODIUM 60 MG/0.6ML IJ SOSY
60.0000 mg | PREFILLED_SYRINGE | INTRAMUSCULAR | Status: DC
  Administered 2021-07-25 – 2021-07-28 (×4): 60 mg via SUBCUTANEOUS
  Filled 2021-07-25 (×5): qty 0.6

## 2021-07-25 MED ORDER — ACETAMINOPHEN 500 MG PO TABS
1000.0000 mg | ORAL_TABLET | Freq: Once | ORAL | Status: AC
  Administered 2021-07-25: 1000 mg via ORAL
  Filled 2021-07-25: qty 2

## 2021-07-25 MED ORDER — MORPHINE SULFATE (PF) 4 MG/ML IV SOLN: 4.0000 mg | INTRAVENOUS | Status: DC | PRN

## 2021-07-25 MED ORDER — PROPOFOL 10 MG/ML IV BOLUS
INTRAVENOUS | Status: DC | PRN
  Administered 2021-07-25 (×2): 100 mg via INTRAVENOUS

## 2021-07-25 MED ORDER — SODIUM CHLORIDE 0.9 % IV SOLN
INTRAVENOUS | Status: AC
  Filled 2021-07-25: qty 20

## 2021-07-25 MED ORDER — FENTANYL CITRATE (PF) 250 MCG/5ML IJ SOLN
INTRAMUSCULAR | Status: DC | PRN
Start: 2021-07-25 — End: 2021-07-25
  Administered 2021-07-25: 50 ug via INTRAVENOUS
  Administered 2021-07-25 (×2): 100 ug via INTRAVENOUS

## 2021-07-25 MED ORDER — MEPERIDINE HCL 25 MG/ML IJ SOLN: 6.2500 mg | INTRAMUSCULAR | Status: DC | PRN

## 2021-07-25 MED ORDER — SENNOSIDES-DOCUSATE SODIUM 8.6-50 MG PO TABS
1.0000 | ORAL_TABLET | Freq: Every day | ORAL | Status: DC
  Filled 2021-07-25 (×2): qty 1

## 2021-07-25 MED ORDER — PHENYLEPHRINE 40 MCG/ML (10ML) SYRINGE FOR IV PUSH (FOR BLOOD PRESSURE SUPPORT)
PREFILLED_SYRINGE | INTRAVENOUS | Status: DC | PRN
Start: 2021-07-25 — End: 2021-07-25
  Administered 2021-07-25: 80 ug via INTRAVENOUS
  Administered 2021-07-25: 40 ug via INTRAVENOUS
  Administered 2021-07-25: 120 ug via INTRAVENOUS
  Administered 2021-07-25: 40 ug via INTRAVENOUS
  Administered 2021-07-25 (×2): 80 ug via INTRAVENOUS

## 2021-07-25 MED ORDER — MIDAZOLAM HCL 2 MG/2ML IJ SOLN
INTRAMUSCULAR | Status: DC | PRN
Start: 2021-07-25 — End: 2021-07-25
  Administered 2021-07-25: 2 mg via INTRAVENOUS

## 2021-07-25 MED ORDER — LIDOCAINE 2% (20 MG/ML) 5 ML SYRINGE
INTRAMUSCULAR | Status: AC
  Filled 2021-07-25: qty 5

## 2021-07-25 MED ORDER — SUGAMMADEX SODIUM 200 MG/2ML IV SOLN
INTRAVENOUS | Status: DC | PRN
Start: 2021-07-25 — End: 2021-07-25
  Administered 2021-07-25: 400 mg via INTRAVENOUS

## 2021-07-25 MED ORDER — PHENYLEPHRINE HCL-NACL 20-0.9 MG/250ML-% IV SOLN
INTRAVENOUS | Status: DC | PRN
  Administered 2021-07-25: 20 ug/min via INTRAVENOUS

## 2021-07-25 MED ORDER — PROMETHAZINE HCL 25 MG/ML IJ SOLN: 6.2500 mg | INTRAMUSCULAR | Status: DC | PRN

## 2021-07-25 MED ORDER — ONDANSETRON HCL 4 MG/2ML IJ SOLN
INTRAMUSCULAR | Status: AC
  Filled 2021-07-25: qty 2

## 2021-07-25 MED ORDER — ROCURONIUM BROMIDE 10 MG/ML (PF) SYRINGE
PREFILLED_SYRINGE | INTRAVENOUS | Status: AC
  Filled 2021-07-25: qty 10

## 2021-07-25 MED ORDER — ONDANSETRON HCL 4 MG/2ML IJ SOLN: 4.0000 mg | Freq: Four times a day (QID) | INTRAMUSCULAR | Status: DC | PRN

## 2021-07-25 MED ORDER — ONDANSETRON HCL 4 MG/2ML IJ SOLN
INTRAMUSCULAR | Status: DC | PRN
  Administered 2021-07-25: 4 mg via INTRAVENOUS

## 2021-07-25 MED ORDER — PROPOFOL 10 MG/ML IV BOLUS
INTRAVENOUS | Status: AC
  Filled 2021-07-25: qty 20

## 2021-07-25 MED ORDER — LACTATED RINGERS IV SOLN: INTRAVENOUS | Status: DC

## 2021-07-25 MED ORDER — CHLORHEXIDINE GLUCONATE 0.12 % MT SOLN
15.0000 mL | Freq: Once | OROMUCOSAL | Status: AC
  Administered 2021-07-25: 15 mL via OROMUCOSAL
  Filled 2021-07-25: qty 15

## 2021-07-25 MED ORDER — DEXAMETHASONE SODIUM PHOSPHATE 10 MG/ML IJ SOLN
INTRAMUSCULAR | Status: DC | PRN
  Administered 2021-07-25: 10 mg via INTRAVENOUS

## 2021-07-25 MED ORDER — HYDROMORPHONE HCL 1 MG/ML IJ SOLN
0.2500 mg | INTRAMUSCULAR | Status: DC | PRN
  Administered 2021-07-25: 0.5 mg via INTRAVENOUS

## 2021-07-25 MED ORDER — DEXAMETHASONE SODIUM PHOSPHATE 10 MG/ML IJ SOLN
INTRAMUSCULAR | Status: AC
  Filled 2021-07-25: qty 1

## 2021-07-25 MED ORDER — ROCURONIUM BROMIDE 10 MG/ML (PF) SYRINGE
PREFILLED_SYRINGE | INTRAVENOUS | Status: DC | PRN
  Administered 2021-07-25: 60 mg via INTRAVENOUS
  Administered 2021-07-25: 20 mg via INTRAVENOUS

## 2021-07-25 MED ORDER — HYDROMORPHONE HCL 1 MG/ML IJ SOLN
INTRAMUSCULAR | Status: AC
  Filled 2021-07-25: qty 1

## 2021-07-25 MED ORDER — MIDAZOLAM HCL 2 MG/2ML IJ SOLN
INTRAMUSCULAR | Status: AC
  Filled 2021-07-25: qty 2

## 2021-07-25 SURGICAL SUPPLY — 71 items
APPLICATOR TIP COSEAL (VASCULAR PRODUCTS) IMPLANT
APPLICATOR TIP EXT COSEAL (VASCULAR PRODUCTS) IMPLANT
BLADE CLIPPER SURG (BLADE) ×3 IMPLANT
CANISTER SUCT 3000ML PPV (MISCELLANEOUS) ×3 IMPLANT
CATH KIT ON-Q SILVERSOAK 5 (CATHETERS) IMPLANT
CATH KIT ON-Q SILVERSOAK 5IN (CATHETERS) IMPLANT
CATH THORACIC 28FR (CATHETERS) ×1 IMPLANT
CATH THORACIC 36FR (CATHETERS) IMPLANT
CATH THORACIC 36FR RT ANG (CATHETERS) IMPLANT
CLEANER TIP ELECTROSURG 2X2 (MISCELLANEOUS) ×3 IMPLANT
CLIP VESOCCLUDE MED 6/CT (CLIP) IMPLANT
CNTNR URN SCR LID CUP LEK RST (MISCELLANEOUS) ×4 IMPLANT
CONT SPEC 4OZ STRL OR WHT (MISCELLANEOUS) ×12
DERMABOND ADVANCED (GAUZE/BANDAGES/DRESSINGS)
DERMABOND ADVANCED .7 DNX12 (GAUZE/BANDAGES/DRESSINGS) IMPLANT
DRAPE LAPAROSCOPIC ABDOMINAL (DRAPES) ×3 IMPLANT
DRAPE WARM FLUID 44X44 (DRAPES) ×3 IMPLANT
ELECT REM PT RETURN 9FT ADLT (ELECTROSURGICAL) ×3
ELECTRODE REM PT RTRN 9FT ADLT (ELECTROSURGICAL) ×2 IMPLANT
GAUZE SPONGE 4X4 12PLY STRL (GAUZE/BANDAGES/DRESSINGS) ×3 IMPLANT
GAUZE SPONGE 4X4 12PLY STRL LF (GAUZE/BANDAGES/DRESSINGS) ×1 IMPLANT
GLOVE SURG ENC MOIS LTX SZ6.5 (GLOVE) ×6 IMPLANT
GLOVE SURG MICRO LTX SZ6 (GLOVE) ×2 IMPLANT
GLOVE SURG MICRO LTX SZ7 (GLOVE) ×7 IMPLANT
GLOVE SURG UNDER POLY LF SZ6 (GLOVE) ×2 IMPLANT
GOWN STRL REUS W/ TWL LRG LVL3 (GOWN DISPOSABLE) ×8 IMPLANT
GOWN STRL REUS W/ TWL XL LVL3 (GOWN DISPOSABLE) ×2 IMPLANT
GOWN STRL REUS W/TWL LRG LVL3 (GOWN DISPOSABLE) ×12
GOWN STRL REUS W/TWL XL LVL3 (GOWN DISPOSABLE) ×3
HANDLE STAPLE ENDO GIA SHORT (STAPLE)
KIT BASIN OR (CUSTOM PROCEDURE TRAY) ×3 IMPLANT
KIT SUCTION CATH 14FR (SUCTIONS) ×4 IMPLANT
KIT TURNOVER KIT B (KITS) ×3 IMPLANT
NS IRRIG 1000ML POUR BTL (IV SOLUTION) ×12 IMPLANT
PACK CHEST (CUSTOM PROCEDURE TRAY) ×3 IMPLANT
PAD ARMBOARD 7.5X6 YLW CONV (MISCELLANEOUS) ×6 IMPLANT
SEALANT SURG COSEAL 4ML (VASCULAR PRODUCTS) IMPLANT
SEALANT SURG COSEAL 8ML (VASCULAR PRODUCTS) IMPLANT
SLEEVE SCD COMPRESS KNEE MED (STOCKING) ×1 IMPLANT
SOL ANTI FOG 6CC (MISCELLANEOUS) IMPLANT
SOLUTION ANTI FOG 6CC (MISCELLANEOUS)
STAPLER ENDO GIA 12 SHRT THIN (STAPLE) IMPLANT
STAPLER ENDO GIA 12MM SHORT (STAPLE) IMPLANT
SUT PROLENE 3 0 SH DA (SUTURE) IMPLANT
SUT PROLENE 4 0 RB 1 (SUTURE)
SUT PROLENE 4-0 RB1 .5 CRCL 36 (SUTURE) IMPLANT
SUT SILK  1 MH (SUTURE) ×6
SUT SILK 1 MH (SUTURE) ×4 IMPLANT
SUT SILK 1 TIES 10X30 (SUTURE) IMPLANT
SUT SILK 2 0SH CR/8 30 (SUTURE) IMPLANT
SUT SILK 3 0SH CR/8 30 (SUTURE) IMPLANT
SUT VIC AB 1 CTX 18 (SUTURE) IMPLANT
SUT VIC AB 1 CTX 36 (SUTURE)
SUT VIC AB 1 CTX36XBRD ANBCTR (SUTURE) IMPLANT
SUT VIC AB 2-0 CTX 36 (SUTURE) IMPLANT
SUT VIC AB 2-0 UR6 27 (SUTURE) IMPLANT
SUT VIC AB 3-0 MH 27 (SUTURE) IMPLANT
SUT VIC AB 3-0 X1 27 (SUTURE) IMPLANT
SUT VICRYL 2 TP 1 (SUTURE) IMPLANT
SWAB COLLECTION DEVICE MRSA (MISCELLANEOUS) IMPLANT
SWAB CULTURE ESWAB REG 1ML (MISCELLANEOUS) IMPLANT
SYSTEM SAHARA CHEST DRAIN ATS (WOUND CARE) ×4 IMPLANT
TAPE CLOTH 4X10 WHT NS (GAUZE/BANDAGES/DRESSINGS) ×3 IMPLANT
TAPE CLOTH SURG 4X10 WHT LF (GAUZE/BANDAGES/DRESSINGS) ×1 IMPLANT
TIP APPLICATOR SPRAY EXTEND 16 (VASCULAR PRODUCTS) IMPLANT
TOWEL GREEN STERILE (TOWEL DISPOSABLE) ×3 IMPLANT
TOWEL GREEN STERILE FF (TOWEL DISPOSABLE) ×3 IMPLANT
TRAP SPECIMEN MUCUS 40CC (MISCELLANEOUS) IMPLANT
TRAY FOLEY MTR SLVR 14FR STAT (SET/KITS/TRAYS/PACK) ×3 IMPLANT
TROCAR XCEL NON-BLD 11X100MML (ENDOMECHANICALS) ×1 IMPLANT
WATER STERILE IRR 1000ML POUR (IV SOLUTION) ×6 IMPLANT

## 2021-07-25 NOTE — Plan of Care (Signed)
°  Problem: Clinical Measurements: Goal: Ability to maintain clinical measurements within normal limits will improve Outcome: Progressing Goal: Will remain free from infection Outcome: Progressing Goal: Cardiovascular complication will be avoided Outcome: Progressing   Problem: Nutrition: Goal: Adequate nutrition will be maintained Outcome: Progressing   Problem: Elimination: Goal: Will not experience complications related to bowel motility Outcome: Progressing Goal: Will not experience complications related to urinary retention Outcome: Progressing   Problem: Safety: Goal: Ability to remain free from injury will improve Outcome: Progressing   Problem: Skin Integrity: Goal: Risk for impaired skin integrity will decrease Outcome: Progressing   Problem: Clinical Measurements: Goal: Respiratory complications will improve Outcome: Not Progressing   Problem: Activity: Goal: Risk for activity intolerance will decrease Outcome: Not Progressing   Problem: Coping: Goal: Level of anxiety will decrease Outcome: Not Progressing   Problem: Pain Managment: Goal: General experience of comfort will improve Outcome: Not Progressing

## 2021-07-25 NOTE — Progress Notes (Signed)
Patient wearing oxygen set at 3lpm with Sp02=100% , patient does not appear to be in any distress. Will continue to monitor patient.

## 2021-07-25 NOTE — Op Note (Signed)
CARDIOTHORACIC SURGERY OPERATIVE NOTE:  Derek Owen [id number] 07/25/2021   Preoperative Dx:  Recurrent large right pleural effusion  Postoperative Dx: Same   Procedure: Right video-assisted thoracoscopy, drainage of pleural effusion and pleural biopsies  Surgeon: Dr. Gaye Owen   Assistant: Derek Owen  Anesthesia: GET   Clinical History:   This 56 year old gentleman has recurrent large bilateral pleural effusions and marked lower extremity edema in the setting of recent COVID infection and rheumatoid arthritis. He had bilateral thoracentesis reportedly removing bloody fluid and cytology negative for malignancy. The right pleural effusion has recurred and is large essentially with whiteout of the right chest. Pulmonary medicine would like to have it drained with pleural biopsies. The effusion looks worse today than yesterday. I will plan to do right VATS with drainage and pleural biopsies. I discussed the operative procedure with the patient including alternatives, benefits and risks; including but not limited to bleeding, blood transfusion, infection, respiratory failure, air leak, recurrent effusion and pleural space problems.   Derek Owen understands and agrees to proceed.   Operative procedure:   The patient was seen in the preoperative holding area. The proper patient, proper operative side, proper operation were confirmed after reviewing his history and chest x-ray. The right side of the chest was signed by me. Preoperative intravenous antibiotics were given. He was taken back to the operating room and placed on table in the supine position. After induction of general endotracheal anesthesia using a double-lumen tube lower extremity sequential compression devices were placed. The patient was positioned in the left lateral decubitus position with the right side up. The right side of the chest was prepped with Betadine soap and solution and draped in the usual sterile manner. A  timeout was taken and the proper patient, proper operation, and proper operative side were confirmed with nursing and anesthesia staff. A 1 cm incision was made in the midaxillary line at about the eighth intercostal space. The left pleural space was entered bluntly with a hemostat and an 8 mm trocar was inserted. There was immediate drainage of 1500 cc of bloody fluid. A sample was sent for cytology. The 30 thoracoscope was inserted and the pleural space inspected.The parietal pleura was inflamed and hemorrhagic. No lesions were seen on the visceral or parietal pleural surfaces. There was mild loculation of the fluid posteriorly and inferiorly. A second 1 cm incision was made in the anterior axillary line at the fifth intercostal space for insertion of instruments. A biopsy forcep was used to break up the remaining loose adhesions and then multiple biopsies of the inflamed parietal pleura were taken and sent to pathology.    Then a 52 French chest tube was placed through the mid-axillary incision. This was fixed to the skin with a silk suture . The lung was reinflated under direct vision and I did not see any significant air leak. Hemostasis appeared complete. The anterior axillary incision was then closed in layers using a 2-0 Vicryl subcutaneous suture and 3-0 Vicryl subcuticular skin closure. The chest tube was connected to Pleur-evac suction. The sponge needle and instrument counts were correct according to the scrub nurse. The patient was then turned into the supine position, extubated, and transported to the post anesthesia care unit in satisfactory and stable condition.

## 2021-07-25 NOTE — Anesthesia Procedure Notes (Signed)
Procedure Name: Intubation Date/Time: 07/25/2021 8:13 AM Performed by: Inda Coke, CRNA Pre-anesthesia Checklist: Patient identified, Emergency Drugs available, Suction available and Patient being monitored Patient Re-evaluated:Patient Re-evaluated prior to induction Oxygen Delivery Method: Circle System Utilized Preoxygenation: Pre-oxygenation with 100% oxygen Induction Type: IV induction Ventilation: Two handed mask ventilation required, Mask ventilation with difficulty and Oral airway inserted - appropriate to patient size Laryngoscope Size: Mac and 4 Grade View: Grade II Tube type: Oral Endobronchial tube: Left, Double lumen EBT, EBT position confirmed by auscultation and EBT position confirmed by fiberoptic bronchoscope and 39 Fr Number of attempts: 1 Airway Equipment and Method: Stylet and Oral airway Placement Confirmation: ETT inserted through vocal cords under direct vision, positive ETCO2 and breath sounds checked- equal and bilateral Tube secured with: Tape Dental Injury: Teeth and Oropharynx as per pre-operative assessment

## 2021-07-25 NOTE — Transfer of Care (Signed)
Immediate Anesthesia Transfer of Care Note  Patient: Derek Owen  Procedure(s) Performed: DRAINAGE OF PLEURAL EFFUSION (Right) PLEURAL BIOPSY (Right) VIDEO ASSISTED THORACOSCOPY (VATS)/EMPYEMA (Right: Chest)  Patient Location: PACU  Anesthesia Type:General  Level of Consciousness: awake, alert  and oriented  Airway & Oxygen Therapy: Patient Spontanous Breathing and Patient connected to nasal cannula oxygen  Post-op Assessment: Report given to RN and Post -op Vital signs reviewed and stable  Post vital signs: Reviewed and stable  Last Vitals:  Vitals Value Taken Time  BP    Temp    Pulse    Resp    SpO2      Last Pain:  Vitals:   07/25/21 0741  TempSrc: Oral  PainSc:          Complications: No notable events documented.

## 2021-07-25 NOTE — Progress Notes (Signed)
NAME:  Derek Owen, MRN:  [id number], DOB:  14-Aug-1965, LOS: 6 ADMISSION DATE:  07/18/2021, CONSULTATION DATE:  07/25/2021  REFERRING MD:  Jorge Mandril, CHIEF COMPLAINT: Respiratory distress requiring BiPAP with bilateral effusions  History of Present Illness:  56 year old smoker, diabetic, with rheumatoid arthritis, unvaccinated, developed COVID infection 12/23 given a course of paxlovid , went to [facility], ED on 07/12/2021, found to have elevated D-dimer and chest x-ray showing new left pleural effusion, VQ scan was low proper PE, discharged home with steroids and albuterol. Increase shortness of breath and BIBEMS to the ED on 1/8 with respiratory distress and hypoxic to the 80s, required BiPAP emergently, bilateral pedal edema noted, BNP normal given 40 of Lasix with good diuresis .  CT angiogram chest was negative for PE, showed bilateral pleural effusions and left upper abdominal calcified mass.  Labs showed mild leukocytosis, stable anemia, normal BNP and troponin, ABG did not show significant hypercarbia  Pertinent  Medical History  Diabetes type 2 Hypertension Rheumatoid arthritis -on methotrexate, follows with Ignacia Marvel, MD, last evaluation 11/7 was asked to increase methotrexate from 15 to 25 mg but has not done this  Significant Hospital Events: Including procedures, antibiotic start and stop dates in addition to other pertinent events   1/2 VQ scan normal or low probe, no peripheral filling defects   1/9 CT angiogram chest >> no pulm embolism, large bilateral pleural effusions with compressive atelectasis, calcified mass in the left upper quadrant of abdomen 2d echo 07/20/2021 with EF 55 to 60% with some left ventricular wall a.m. labs 1/9 LT thora >> 600 cc bloody fluid, RT >> 1500 cc bloody fluid 1/10 LT >> 900 cc bloody fluid 1/11 RT thora >> 1600 bloody fluid 1/11 CT of the chest with decreased pleural effusions, mass noted on pole of the kidney 07/23/2021 return of right  pleural effusion 1/15 VATS drainage of right hemothorax with pleural biopsy, 1.5 L  Interim History / Subjective:   Sitting in bed having lunch after procedure Right chest tube with bloody output  Objective   Blood pressure 114/78, pulse 90, temperature 98.2 F (36.8 C), temperature source Oral, resp. rate 18, height 6' 0.01" (1.829 m), weight (!) 142.4 kg, SpO2 100 %.        Intake/Output Summary (Last 24 hours) at 07/25/2021 1521 Last data filed at 07/25/2021 1030 Gross per 24 hour  Intake 1000 ml  Output 2950 ml  Net -1950 ml    Filed Weights   07/18/21 2049 07/25/21 0732  Weight: (!) 142.4 kg (!) 142.4 kg    Examination: General: Obese male no acute distress HEENT: MM pink/moist Neuro: No focal deep CV: S1-S2 distant PULM: Decreased breath sounds right, improved on left  GI: soft, bsx4 active  GU: Voids Extremities: warm/dry, 2+ edema  Skin: no rashes or lesions    Chest x-ray 1/15 significant improvement in aeration of the right lung with patchy airspace disease, left lung remains clear relatively  Resolved Hospital Problem list     Assessment & Plan:  Acute hypoxic respiratory failure requiring transient BiPAP , hypoxia much improved with drainage of effusions  -Reassess need for oxygen on discharge  Etiology of bilateral effusions unclear -on thoracentesis this appeared bloody, hemorrhagic effusions possibly related to rheumatoid arthritis, he is not on anticoagulation, pulm embolism has been ruled out , he reports a fall 2 weeks ago Echo shows normal LV function Pleural fluid cytology is negative x 2 QuantiFERON gold is negative Calcified mass  left kidney is felt to be complex renal cyst  Plan: Follow-up pleural biopsy. Chest tube management per surgery  PCCM will follow intermittently   Labs   CBC: Recent Labs  Lab 07/18/21 2045 07/18/21 2056 07/21/21 0101 07/22/21 0136 07/23/21 0110 07/24/21 0109 07/25/21 0101  WBC 12.6*   < > 13.7*  9.1 10.4 12.3* 12.5*  NEUTROABS 10.2*  --  10.8*  --  7.7 9.3* 9.3*  HGB 10.8*   < > 8.9* 8.1* 8.6* 8.7* 8.5*  HCT 34.6*   < > 28.1* 25.7* 27.8* 28.0* 27.3*  MCV 75.7*   < > 75.1* 74.7* 75.5* 74.5* 74.8*  PLT 203   < > 204 192 237 295 322   < > = values in this interval not displayed.     Basic Metabolic Panel: Recent Labs  Lab 07/21/21 0101 07/22/21 0136 07/23/21 0110 07/24/21 0109 07/25/21 0101  NA 132* 133* 134* 137 137  K 3.9 3.2* 3.3* 3.5 3.5  CL 93* 95* 92* 94* 93*  CO2 31 30 32 34* 34*  GLUCOSE 160* 206* 220* 152* 98  BUN 23* 18 14 14 14   CREATININE 1.16 0.95 1.00 0.98 0.97  CALCIUM 8.4* 7.8* 8.0* 8.6* 8.6*  MG  --   --   --  2.0  --   PHOS 3.7  --   --  3.7  --     GFR: Estimated Creatinine Clearance: 124.5 mL/min (by C-G formula based on SCr of 0.97 mg/dL). Recent Labs  Lab 07/19/21 0350 07/19/21 0810 07/19/21 1212 07/22/21 0136 07/23/21 0110 07/24/21 0109 07/25/21 0101  PROCALCITON <0.10  --   --   --   --   --   --   WBC  --   --    < > 9.1 10.4 12.3* 12.5*  LATICACIDVEN 2.0* 1.9  --   --   --   --   --    < > = values in this interval not displayed.     Liver Function Tests: Recent Labs  Lab 07/18/21 2045 07/19/21 1212  AST 22  --   ALT 18  --   ALKPHOS 78  --   BILITOT 1.1  --   PROT 6.7 6.4*  ALBUMIN 3.4*  --     No results for input(s): LIPASE, AMYLASE in the last 168 hours. No results for input(s): AMMONIA in the last 168 hours.  ABG    Component Value Date/Time   PHART 7.451 (H) 07/18/2021 2118   PCO2ART 44.4 07/18/2021 2118   PO2ART 116 (H) 07/18/2021 2118   HCO3 31.0 (H) 07/18/2021 2118   TCO2 32 07/18/2021 2118   O2SAT 99.0 07/18/2021 2118      Coagulation Profile: No results for input(s): INR, PROTIME in the last 168 hours.  Cardiac Enzymes: No results for input(s): CKTOTAL, CKMB, CKMBINDEX, TROPONINI in the last 168 hours.  HbA1C: HbA1c, POC (controlled diabetic range)  Date/Time Value Ref Range Status   04/26/2021 01:39 PM 8.7 (A) 0.0 - 7.0 % Final   Hgb A1c MFr Bld  Date/Time Value Ref Range Status  07/19/2021 08:10 AM 8.0 (H) 4.8 - 5.6 % Final    Comment:    (NOTE)         Prediabetes: 5.7 - 6.4         Diabetes: >6.4         Glycemic control for adults with diabetes: <7.0   11/20/2020 08:47 AM 11.3 (H) 4.8 - 5.6 % Final  Comment:             Prediabetes: 5.7 - 6.4          Diabetes: >6.4          Glycemic control for adults with diabetes: <7.0     CBG: Recent Labs  Lab 07/25/21 0016 07/25/21 0327 07/25/21 0729 07/25/21 0945 07/25/21 1050  GLUCAP 117* 105* 144* 128* 175*    Kara Mead MD. FCCP. [facility] Pager : [phone]  If no response to pager , please call [phone] until 7 pm After 7:00 pm call Elink  [phone]     07/25/2021, 3:21 PM

## 2021-07-25 NOTE — Plan of Care (Signed)
  Problem: Education: Goal: Knowledge of General Education information will improve Description Including pain rating scale, medication(s)/side effects and non-pharmacologic comfort measures Outcome: Progressing   Problem: Health Behavior/Discharge Planning: Goal: Ability to manage health-related needs will improve Outcome: Progressing   

## 2021-07-25 NOTE — Brief Op Note (Signed)
07/25/2021  11:17 AM  PATIENT:  Derek Owen  56 y.o. male  PRE-OPERATIVE DIAGNOSIS:  Recurrent right pleural effusion  POST-OPERATIVE DIAGNOSIS:  recurrent right pleural effusion  PROCEDURE:  Procedure(s): DRAINAGE OF PLEURAL EFFUSION (Right) PLEURAL BIOPSY (Right) VIDEO ASSISTED THORACOSCOPY (VATS)/EMPYEMA (Right)  SURGEON:  Surgeon(s) and Role:    * Marek Nghiem, Fernande Boyden, MD - Primary  PHYSICIAN ASSISTANT: none  ASSISTANTS: RNFA   ANESTHESIA:   general  EBL:  10 mL   BLOOD ADMINISTERED:none  DRAINS:  28 F Chest Tube(s) in the right pleural space    LOCAL MEDICATIONS USED:  NONE  SPECIMEN:  Source of Specimen:  pleural fluid for cytology and pleural biopsies to pathology  1500 cc of bloody fluid drained.   COUNTS:  YES  TOURNIQUET:  * No tourniquets in log *  DICTATION: .Note written in [place]: Admit to inpatient   PATIENT DISPOSITION:  PACU - hemodynamically stable.   Delay start of Pharmacological VTE agent (>24hrs) due to surgical blood loss or risk of bleeding: no

## 2021-07-25 NOTE — Progress Notes (Signed)
°   07/25/21 0012  Assess: MEWS Score  Temp 98.4 F (36.9 C)  BP (!) 132/97  Pulse Rate (!) 108  ECG Heart Rate (!) 104  Resp (!) 23  SpO2 96 %  O2 Device Nasal Cannula  Assess: MEWS Score  MEWS Temp 0  MEWS Systolic 0  MEWS Pulse 1  MEWS RR 1  MEWS LOC 0  MEWS Score 2  MEWS Score Color Yellow  Assess: if the MEWS score is Yellow or Red  Were vital signs taken at a resting state? No  Focused Assessment No change from prior assessment  Early Detection of Sepsis Score *See Row Information* High  MEWS guidelines implemented *See Row Information* No, vital signs rechecked  Notify: Charge Nurse/RN  Name of Charge Nurse/RN Notified Vinnie Level, RN  Date Charge Nurse/RN Notified 07/25/21  Time Charge Nurse/RN Notified [phone]

## 2021-07-25 NOTE — Interval H&P Note (Signed)
History and Physical Interval Note:  07/25/2021 7:16 AM  Derek Owen  has presented today for surgery, with the diagnosis of Recurrent right pleural effusion.  The various methods of treatment have been discussed with the patient and family. After consideration of risks, benefits and other options for treatment, the patient has consented to  Procedure(s): DRAINAGE OF PLEURAL EFFUSION (Right) PLEURAL BIOPSY (Right) VIDEO ASSISTED THORACOSCOPY (VATS)/EMPYEMA (N/A) as a surgical intervention.  The patient's history has been reviewed, patient examined, no change in status, stable for surgery.  I have reviewed the patient's chart and labs.  Questions were answered to the patient's satisfaction.     Gaye Pollack

## 2021-07-25 NOTE — Plan of Care (Signed)

## 2021-07-25 NOTE — Progress Notes (Signed)
PROGRESS NOTE    Derek Owen  TKW:[id number] DOB: 09/12/1965 DOA: 07/18/2021 PCP: Renee Rival, FNP   Chief Complain: Respiratory distress  Brief Narrative: Patient is a 56 year old male with history of tobacco use, diabetes type 2, rheumatoid arthritis, recent history of COVID who presented to the emergency department at [facility] with respiratory status, hypoxic.  She was earlier seen in the ED on 1/2 and was found to have elevated D-dimer, chest x-ray showed left pleural effusion and was discharged to home with a steroid and albuterol.  On presentation, she had to be put on BiPAP.  Lab work showed elevated BNP.  CTA chest was negative for PE but showed bilateral pleural effusion, left upper abdominal calcified mass.  Lab work also showed leukocytosis.   He underwent bilateral thoracentesis, tap was bloody.  Currently on IV diuresis for volume overload.  Underwent  pleural biopsy and chest tube placement by cardiothoracic surgery on 1/15  Assessment & Plan:   Principal Problem:   Acute respiratory failure with hypoxia ([place]) Active Problems:   DM2 (diabetes mellitus, type 2) ([place])   Hypertension associated with diabetes ([place])   Bilateral pleural effusion   Mass of abdomen   Pleural effusion   Acute hypoxic respiratory failure/bilateral pleural effusion: Secondary to bilateral pleural effusion +/- pneumonia. He required BiPAP on presentation.  Underwent bilateral thoracentesis on 1/9 .  Underwent repeat left thoracentesis on 1/10 with removal of minimal bloody fluid.  Cytology does not show malignant cells from earlier thoracentesis,repeat is pending.  Culture did not show any organism.  QuantiFERON TB Gold negative. CT chest also showed bilateral lower lobe opacity suspicious for pneumonia. Patient has history of rheumatoid arthritis, which could be the etiology for bloody pleural fluid but malignancy is a possibility. Recent history of COVID, was taking Paxlovid  and  prednisone. CXR on 1/14 showed reaccumulation of pleural effusion on the right side with loculation, progressive atelectasis.  Underwent right sided  pleural biopsy and chest tube placement.  Left upper abdominal calcified mass/left kidney cyst: CT chest/abdomen/pelvis with contrast showed indeterminate partially calcified mass involving the upper pole of the left kidney. CT kidney showed 10.6 cm complex cystic lesion in upper pole of left kidney, consistent with an indeterminate but probably benign Bosniak category 60F lesion. Recommend continued imaging follow-up in 6 months, preferably with abdomen MRI without and with contrast.   Lower extremity edema: Normal BNP..  2D echo showed normal ejection fraction, asymmetric left ventricular hypertrophy.  Looks significantly volume overloaded with bilateral lower extremity edema.  We will continue Lasix 60 mg IV twice daily  Microcytic anemia: Hemoglobin currently in the range of 8-9.  Iron studies showed low iron.  Given IV iron.  No report of hematochezia or melena or acute blood loss.  CT abdomen/pelvis did not show any source of potential bleed. No history of EGD and colonoscopy in the past. Continue oral iron supplementation on DC  Type 2 diabetes: Takes oral diabetic medications at home.  Continue current insulin regimen.  Hypertension: Currently BP stable.  Antihypertensives on hold  History of alcohol use: Continue thiamine, folic acid, on CIWA protocol  History of hyperlipidemia: Takes Lipitor at home.  History of rheumatoid arthritis: Takes methotrexate.  We recommend to follow-up with hematology as an outpatient.  Hypokalemia: Being supplemented and monitored.  Obesity: BMI of 42.5  Deconditioning/debility: PT/OT evaluated him, no follow-up recommended.Needs reevaluation            DVT prophylaxis:Lovenox Code Status: Full  Family Communication: None at the bedside Patient status:Inpatient  Dispo: The patient is from:  Home              Anticipated d/c is to: Home              Anticipated d/c date BO:FBPZW complete work up  Consultants: PCCM  Procedures: Thoracentesis  Antimicrobials:  Anti-infectives (From admission, onward)    Start     Dose/Rate Route Frequency Ordered Stop   07/25/21 0743  sodium chloride 0.9 % with cefTRIAXone (ROCEPHIN) ADS Med       Note to Pharmacy: Rejeana Brock L: cabinet override      07/25/21 0743 07/25/21 1959   07/21/21 1630  [MAR Hold]  cefTRIAXone (ROCEPHIN) 1 g in sodium chloride 0.9 % 100 mL IVPB        (MAR Hold since Sun 07/25/2021 at 0706.Hold Reason: Transfer to a Procedural area)   1 g 200 mL/hr over 30 Minutes Intravenous Every 24 hours 07/21/21 1540     07/19/21 0330  cefTRIAXone (ROCEPHIN) 1 g in sodium chloride 0.9 % 100 mL IVPB        1 g 200 mL/hr over 30 Minutes Intravenous  Once 07/19/21 0323 07/19/21 0502   07/19/21 0330  azithromycin (ZITHROMAX) 500 mg in sodium chloride 0.9 % 250 mL IVPB        500 mg 250 mL/hr over 60 Minutes Intravenous  Once 07/19/21 0323 07/19/21 2585       Subjective:  Patient seen and examined at PACU today.  Just came out of OR.  Had a chest tube placed, tube draining sanguinous fluid.  Denies any pain  Objective: Vitals:   07/25/21 0036 07/25/21 0329 07/25/21 0732 07/25/21 0741  BP: 119/87 121/78  133/85  Pulse:  94  97  Resp: 20 20  (!) 22  Temp: 98.4 F (36.9 C) 98.9 F (37.2 C)  97.9 F (36.6 C)  TempSrc: Oral Oral  Oral  SpO2:  96%  95%  Weight:   (!) 142.4 kg   Height:   6' 0.01" (1.829 m)     Intake/Output Summary (Last 24 hours) at 07/25/2021 0820 Last data filed at 07/24/2021 1900 Gross per 24 hour  Intake 400 ml  Output 2400 ml  Net -2000 ml   Filed Weights   07/18/21 2049 07/25/21 0732  Weight: (!) 142.4 kg (!) 142.4 kg    Examination:   General exam: Morbidly obese, weak, deconditioned HEENT: PERRL Respiratory system: Chest tube in the right side, no wheezes or crackles   cardiovascular system: S1 & S2 heard, RRR.  Gastrointestinal system: Abdomen is nondistended, soft and nontender. Central nervous system: Alert and oriented Extremities: Trace bilateral lower extremity edema, no clubbing ,no cyanosis Skin: No rashes, no ulcers,no icterus    Data Reviewed: I have personally reviewed following labs and imaging studies  CBC: Recent Labs  Lab 07/18/21 2045 07/18/21 2056 07/21/21 0101 07/22/21 0136 07/23/21 0110 07/24/21 0109 07/25/21 0101  WBC 12.6*   < > 13.7* 9.1 10.4 12.3* 12.5*  NEUTROABS 10.2*  --  10.8*  --  7.7 9.3* 9.3*  HGB 10.8*   < > 8.9* 8.1* 8.6* 8.7* 8.5*  HCT 34.6*   < > 28.1* 25.7* 27.8* 28.0* 27.3*  MCV 75.7*   < > 75.1* 74.7* 75.5* 74.5* 74.8*  PLT 203   < > 204 192 237 295 322   < > = values in this interval not displayed.   Basic Metabolic  Panel: Recent Labs  Lab 07/21/21 0101 07/22/21 0136 07/23/21 0110 07/24/21 0109 07/25/21 0101  NA 132* 133* 134* 137 137  K 3.9 3.2* 3.3* 3.5 3.5  CL 93* 95* 92* 94* 93*  CO2 31 30 32 34* 34*  GLUCOSE 160* 206* 220* 152* 98  BUN 23* 18 14 14 14   CREATININE 1.16 0.95 1.00 0.98 0.97  CALCIUM 8.4* 7.8* 8.0* 8.6* 8.6*  MG  --   --   --  2.0  --   PHOS 3.7  --   --  3.7  --    GFR: Estimated Creatinine Clearance: 124.5 mL/min (by C-G formula based on SCr of 0.97 mg/dL). Liver Function Tests: Recent Labs  Lab 07/18/21 2045 07/19/21 1212  AST 22  --   ALT 18  --   ALKPHOS 78  --   BILITOT 1.1  --   PROT 6.7 6.4*  ALBUMIN 3.4*  --    No results for input(s): LIPASE, AMYLASE in the last 168 hours. No results for input(s): AMMONIA in the last 168 hours. Coagulation Profile: No results for input(s): INR, PROTIME in the last 168 hours. Cardiac Enzymes: No results for input(s): CKTOTAL, CKMB, CKMBINDEX, TROPONINI in the last 168 hours. BNP (last 3 results) No results for input(s): PROBNP in the last 8760 hours. HbA1C: No results for input(s): HGBA1C in the last 72  hours.  CBG: Recent Labs  Lab 07/24/21 1547 07/24/21 2007 07/25/21 0016 07/25/21 0327 07/25/21 0729  GLUCAP 246* 258* 117* 105* 144*   Lipid Profile: No results for input(s): CHOL, HDL, LDLCALC, TRIG, CHOLHDL, LDLDIRECT in the last 72 hours. Thyroid Function Tests: No results for input(s): TSH, T4TOTAL, FREET4, T3FREE, THYROIDAB in the last 72 hours. Anemia Panel: No results for input(s): VITAMINB12, FOLATE, FERRITIN, TIBC, IRON, RETICCTPCT in the last 72 hours.  Sepsis Labs: Recent Labs  Lab 07/19/21 0350 07/19/21 0810  PROCALCITON <0.10  --   LATICACIDVEN 2.0* 1.9    Recent Results (from the past 240 hour(s))  Resp Panel by RT-PCR (Flu A&B, Covid) Nasopharyngeal Swab     Status: None   Collection Time: 07/18/21  8:48 PM   Specimen: Nasopharyngeal Swab; Nasopharyngeal(NP) swabs in vial transport medium  Result Value Ref Range Status   SARS Coronavirus 2 by RT PCR NEGATIVE NEGATIVE Final    Comment: (NOTE) SARS-CoV-2 target nucleic acids are NOT DETECTED.  The SARS-CoV-2 RNA is generally detectable in upper respiratory specimens during the acute phase of infection. The lowest concentration of SARS-CoV-2 viral copies this assay can detect is 138 copies/mL. A negative result does not preclude SARS-Cov-2 infection and should not be used as the sole basis for treatment or other patient management decisions. A negative result may occur with  improper specimen collection/handling, submission of specimen other than nasopharyngeal swab, presence of viral mutation(s) within the areas targeted by this assay, and inadequate number of viral copies(<138 copies/mL). A negative result must be combined with clinical observations, patient history, and epidemiological information. The expected result is Negative.  Fact Sheet for Patients:  EntrepreneurPulse.com.au  Fact Sheet for Healthcare Providers:  IncredibleEmployment.be  This test is no t  yet approved or cleared by the [place] FDA and  has been authorized for detection and/or diagnosis of SARS-CoV-2 by FDA under an Emergency Use Authorization (EUA). This EUA will remain  in effect (meaning this test can be used) for the duration of the COVID-19 declaration under Section 564(b)(1) of the Act, 21 U.S.C.section 360bbb-3(b)(1), unless the authorization  is terminated  or revoked sooner.       Influenza A by PCR NEGATIVE NEGATIVE Final   Influenza B by PCR NEGATIVE NEGATIVE Final    Comment: (NOTE) The Xpert Xpress SARS-CoV-2/FLU/RSV plus assay is intended as an aid in the diagnosis of influenza from Nasopharyngeal swab specimens and should not be used as a sole basis for treatment. Nasal washings and aspirates are unacceptable for Xpert Xpress SARS-CoV-2/FLU/RSV testing.  Fact Sheet for Patients: EntrepreneurPulse.com.au  Fact Sheet for Healthcare Providers: IncredibleEmployment.be  This test is not yet approved or cleared by the [place] FDA and has been authorized for detection and/or diagnosis of SARS-CoV-2 by FDA under an Emergency Use Authorization (EUA). This EUA will remain in effect (meaning this test can be used) for the duration of the COVID-19 declaration under Section 564(b)(1) of the Act, 21 U.S.C. section 360bbb-3(b)(1), unless the authorization is terminated or revoked.  Performed at [facility], [place] [address], [place], [place] [address]   Body fluid culture w Gram Stain     Status: None   Collection Time: 07/19/21 11:02 AM   Specimen: Pleural Fluid  Result Value Ref Range Status   Specimen Description PLEURAL FLUID  Final   Special Requests LEFT  Final   Gram Stain   Final    ABUNDANT WBC PRESENT,BOTH PMN AND MONONUCLEAR NO ORGANISMS SEEN    Culture   Final    NO GROWTH Performed at [facility], [place] [address] [place], [place] [address]    Report Status 07/22/2021 FINAL  Final   Body fluid culture w Gram Stain     Status: None   Collection Time: 07/19/21  3:25 PM   Specimen: Pleural Fluid  Result Value Ref Range Status   Specimen Description FLUID PLEURAL  Final   Special Requests NONE  Final   Gram Stain   Final    RARE WBC PRESENT,BOTH PMN AND MONONUCLEAR NO ORGANISMS SEEN    Culture   Final    NO GROWTH 3 DAYS Performed at [facility], [place] [address], [place], [place] [address]    Report Status 07/23/2021 FINAL  Final  MRSA Next Gen by PCR, Nasal     Status: None   Collection Time: 07/20/21  2:58 PM   Specimen: Nasal Mucosa; Nasal Swab  Result Value Ref Range Status   MRSA by PCR Next Gen NOT DETECTED NOT DETECTED Final    Comment: (NOTE) The GeneXpert MRSA Assay (FDA approved for NASAL specimens only), is one component of a comprehensive MRSA colonization surveillance program. It is not intended to diagnose MRSA infection nor to guide or monitor treatment for MRSA infections. Test performance is not FDA approved in patients less than 1 years old. Performed at [facility], [place] [address], [place],  [address]          Radiology Studies: DG Chest Port 1 View  Result Date: 07/24/2021 CLINICAL DATA:  Follow-up large pleural effusion EXAM: PORTABLE CHEST 1 VIEW COMPARISON:  Chest radiograph from one day prior. FINDINGS: Stable cardiomediastinal silhouette with top-normal heart size. No pneumothorax. Large right pleural effusion nearly completely opacifying the right hemithorax, increased. No left pleural effusion. Clear left lung. IMPRESSION: Large right pleural effusion, nearly completely opacifying the right hemithorax, increased. Electronically Signed   By: Ilona Sorrel M.D.   On: 07/24/2021 08:10   CT RENAL ABD W/WO  Result Date: 07/24/2021 CLINICAL DATA:  Indeterminate left renal lesion on recent  CT. EXAM: CT ABDOMEN WITHOUT AND WITH CONTRAST TECHNIQUE: Multidetector CT imaging of the abdomen was performed following the  standard protocol before and following the bolus administration of intravenous contrast. RADIATION DOSE REDUCTION: This exam was performed according to the departmental dose-optimization program which includes automated exposure control, adjustment of the mA and/or kV according to patient size and/or use of iterative reconstruction technique. CONTRAST:  38mL OMNIPAQUE IOHEXOL 350 MG/ML SOLN COMPARISON:  07/21/2021 FINDINGS: Lower chest: Increased size of large right pleural effusion with compressive atelectasis right lower lung. Decreased airspace disease in posterior left lower lobe. Persistent tiny left pleural effusion. Hepatobiliary: No hepatic masses identified. Gallbladder is unremarkable. No evidence of biliary ductal dilatation. Pancreas:  No mass or inflammatory changes. Spleen:  Within normal limits in size and appearance. Adrenals/Urinary Tract: Normal appearance of adrenal glands and right kidney. A large low-attenuation lesion is seen in the upper pole of the left kidney, which measures 10.7 x 10.6 cm. This shows thin peripheral rim calcifications, as well as scattered internal calcifications likely within internal septations. This lesion shows no evidence of internal contrast enhancement. This meets criteria for a probably benign Bosniak category 674F lesion. No evidence of hydronephrosis. Stomach/Bowel: Diverticulosis is seen involving the visualized portion of the transverse colon, without signs of diverticulitis in this region. Vascular/Lymphatic: No pathologically enlarged lymph nodes identified. No acute vascular findings. Aortic atherosclerotic calcification noted. Other:  None. Musculoskeletal:  No suspicious bone lesions identified. IMPRESSION: 10.6 cm complex cystic lesion in upper pole of left kidney, consistent with an indeterminate but probably benign Bosniak category 674F lesion. Recommend continued imaging follow-up in 6 months, preferably with abdomen MRI without and with contrast. Increased  size of large right pleural effusion and compressive atelectasis. Decreased airspace disease in posterior left lower lobe. Persistent tiny left pleural effusion. Aortic Atherosclerosis (ICD10-I70.0). Electronically Signed   By: Marlaine Hind M.D.   On: 07/24/2021 15:25   VAS [place] LOWER EXTREMITY VENOUS (DVT)  Result Date: 07/23/2021  Lower Venous DVT Study Patient Name:  RORIK VESPA  Date of Exam:   07/23/2021 Medical Rec #: [id number]        Accession #:    [id number] Date of Birth: 02/08/1966        Patient Gender: M Patient Age:   58 years Exam Location:  [facility] Procedure:      VAS [place] LOWER EXTREMITY VENOUS (DVT) Referring Phys: Noemi Chapel --------------------------------------------------------------------------------  Indications: Edema.  Comparison Study: No previous exams Performing Technologist: Jody Hill RVT, RDMS  Examination Guidelines: A complete evaluation includes B-mode imaging, spectral Doppler, color Doppler, and power Doppler as needed of all accessible portions of each vessel. Bilateral testing is considered an integral part of a complete examination. Limited examinations for reoccurring indications may be performed as noted. The reflux portion of the exam is performed with the patient in reverse Trendelenburg.  +---------+---------------+---------+-----------+----------+-------------------+  RIGHT     Compressibility Phasicity Spontaneity Properties Thrombus Aging       +---------+---------------+---------+-----------+----------+-------------------+  CFV       Full            Yes       Yes                                         +---------+---------------+---------+-----------+----------+-------------------+  SFJ       Full                                                                  +---------+---------------+---------+-----------+----------+-------------------+  FV Prox   Full            Yes       Yes                                          +---------+---------------+---------+-----------+----------+-------------------+  FV Mid    Full            Yes       Yes                    Not well visualized  +---------+---------------+---------+-----------+----------+-------------------+  FV Distal Full            Yes       Yes                    Not well visualized  +---------+---------------+---------+-----------+----------+-------------------+  PFV       Full                                                                  +---------+---------------+---------+-----------+----------+-------------------+  POP       Full            Yes       Yes                                         +---------+---------------+---------+-----------+----------+-------------------+  PTV       Full                                                                  +---------+---------------+---------+-----------+----------+-------------------+  PERO      Full                                                                  +---------+---------------+---------+-----------+----------+-------------------+   +---------+---------------+---------+-----------+----------+-------------------+  LEFT      Compressibility Phasicity Spontaneity Properties Thrombus Aging       +---------+---------------+---------+-----------+----------+-------------------+  CFV       Full            Yes       Yes                                         +---------+---------------+---------+-----------+----------+-------------------+  SFJ       Full                                                                  +---------+---------------+---------+-----------+----------+-------------------+  FV Prox   Full            Yes       Yes                                         +---------+---------------+---------+-----------+----------+-------------------+  FV Mid    Full            Yes       Yes                    Not well visualized  +---------+---------------+---------+-----------+----------+-------------------+  FV  Distal Full            Yes       Yes                    Not well visualized  +---------+---------------+---------+-----------+----------+-------------------+  PFV       Full                                                                  +---------+---------------+---------+-----------+----------+-------------------+  POP       Full            Yes       Yes                                         +---------+---------------+---------+-----------+----------+-------------------+  PTV       Full                                                                  +---------+---------------+---------+-----------+----------+-------------------+  PERO      Full                                                                  +---------+---------------+---------+-----------+----------+-------------------+     Summary: BILATERAL: - No evidence of deep vein thrombosis seen in the lower extremities, bilaterally. - No evidence of superficial venous thrombosis in the lower extremities, bilaterally. - RIGHT: - No cystic structure found in the popliteal fossa.  LEFT: - A cystic structure is found in the popliteal fossa.  *See table(s) above for measurements and observations. Electronically signed by Jamelle Haring on 07/23/2021 at 2:54:28 PM.    Final         Scheduled Meds:  [MAR Hold] enoxaparin (LOVENOX) injection  60 mg Subcutaneous G31D   [MAR Hold] folic acid  1 mg Oral Daily   [MAR Hold] furosemide  60 mg Intravenous BID   [MAR Hold] insulin aspart  0-9 Units Subcutaneous Q4H   [MAR Hold] insulin aspart  5 Units Subcutaneous TID WC   [facility]  Hold] insulin glargine-yfgn  30 Units Subcutaneous Daily   [MAR Hold] mouth rinse  15 mL Mouth Rinse BID   [MAR Hold] multivitamin with minerals  1 tablet Oral Daily   [MAR Hold] potassium chloride  40 mEq Oral Daily   [MAR Hold] thiamine  100 mg Oral Daily   Or   [MAR Hold] thiamine  100 mg Intravenous Daily   Continuous Infusions:  [MAR Hold] cefTRIAXone (ROCEPHIN)  IV 1  g (07/23/21 1633)   lactated ringers 10 mL/hr at 07/25/21 0739   cefTRIAXone (ROCEPHIN) IVPB 2 gram/100 mL NS (Mini-Bag Plus)       LOS: 6 days    Time spent: 35 mins.More than 50% of that time was spent in counseling and/or coordination of care.      Shelly Coss, MD [facility] P1/15/2023, 8:20 AM

## 2021-07-25 NOTE — Progress Notes (Signed)
RN call to patient's daughter in [place], Derek Owen, to notify her that he is out of surgery and doing well

## 2021-07-25 NOTE — Anesthesia Preprocedure Evaluation (Addendum)
Anesthesia Evaluation  Patient identified by MRN, date of birth, ID band Patient awake    Reviewed: Allergy & Precautions, NPO status , Patient's Chart, lab work & pertinent test results  Airway Mallampati: III  TM Distance: >3 FB Neck ROM: Full    Dental  (+) Loose, Dental Advisory Given, Teeth Intact,    Pulmonary Current Smoker,    Pulmonary exam normal breath sounds clear to auscultation       Cardiovascular hypertension, Pt. on medications Normal cardiovascular exam Rhythm:Regular Rate:Normal  Echo 07/19/2021 1. Left ventricular ejection fraction, by estimation, is 55 to 60%. The left ventricle has normal function. The left ventricle has no regional wall motion abnormalities. There is moderate asymmetric left ventricular hypertrophy of the basal-septal segment. Left ventricular diastolic parameters are indeterminate.  2. Right ventricular systolic function is normal. The right ventricular size is normal. Tricuspid regurgitation signal is inadequate for assessing PA pressure.  3. The mitral valve is normal in structure. No evidence of mitral valve regurgitation.  4. The aortic valve was not well visualized. Aortic valve regurgitation is not visualized. No aortic stenosis is present   Neuro/Psych CVA    GI/Hepatic negative GI ROS, Neg liver ROS,   Endo/Other  diabetes, Poorly Controlled, Type 2Morbid obesity  Renal/GU negative Renal ROS     Musculoskeletal negative musculoskeletal ROS (+)   Abdominal (+) + obese,   Peds  Hematology negative hematology ROS (+)   Anesthesia Other Findings   Reproductive/Obstetrics                            Anesthesia Physical Anesthesia Plan  ASA: 3  Anesthesia Plan: General   Post-op Pain Management: Minimal or no pain anticipated and Tylenol PO (pre-op)   Induction: Intravenous  PONV Risk Score and Plan: 2 and Ondansetron, Dexamethasone, Treatment  may vary due to age or medical condition and Midazolam  Airway Management Planned: Double Lumen EBT  Additional Equipment:   Intra-op Plan:   Post-operative Plan: Extubation in OR  Informed Consent: I have reviewed the patients History and Physical, chart, labs and discussed the procedure including the risks, benefits and alternatives for the proposed anesthesia with the patient or authorized representative who has indicated his/her understanding and acceptance.     Dental advisory given  Plan Discussed with: CRNA  Anesthesia Plan Comments:        Anesthesia Quick Evaluation

## 2021-07-25 NOTE — Anesthesia Postprocedure Evaluation (Signed)
Anesthesia Post Note  Patient: Derek Owen  Procedure(s) Performed: DRAINAGE OF PLEURAL EFFUSION (Right) PLEURAL BIOPSY (Right) VIDEO ASSISTED THORACOSCOPY (VATS)/EMPYEMA (Right: Chest)     Patient location during evaluation: PACU Anesthesia Type: General Level of consciousness: sedated and patient cooperative Pain management: pain level controlled Vital Signs Assessment: post-procedure vital signs reviewed and stable Respiratory status: spontaneous breathing Cardiovascular status: stable Anesthetic complications: no   No notable events documented.  Last Vitals:  Vitals:   07/25/21 1030 07/25/21 1055  BP: 126/82 114/78  Pulse: 91 90  Resp: 18 18  Temp: 36.8 C 36.8 C  SpO2: 98% 100%    Last Pain:  Vitals:   07/25/21 1055  TempSrc: Oral  PainSc: 0-No pain                 Nolon Nations

## 2021-07-26 ENCOUNTER — Inpatient Hospital Stay (HOSPITAL_COMMUNITY): Payer: Managed Care, Other (non HMO)

## 2021-07-26 ENCOUNTER — Encounter (HOSPITAL_COMMUNITY): Payer: Self-pay | Admitting: Surgery

## 2021-07-26 DIAGNOSIS — J9601 Acute respiratory failure with hypoxia: Secondary | ICD-10-CM | POA: Diagnosis not present

## 2021-07-26 LAB — CBC WITH DIFFERENTIAL/PLATELET
Abs Immature Granulocytes: 0.26 10*3/uL — ABNORMAL HIGH (ref 0.00–0.07)
Basophils Absolute: 0 10*3/uL (ref 0.0–0.1)
Basophils Relative: 0 %
Eosinophils Absolute: 0 10*3/uL (ref 0.0–0.5)
Eosinophils Relative: 0 %
HCT: 27.7 % — ABNORMAL LOW (ref 39.0–52.0)
Hemoglobin: 8.7 g/dL — ABNORMAL LOW (ref 13.0–17.0)
Immature Granulocytes: 2 %
Lymphocytes Relative: 6 %
Lymphs Abs: 1 10*3/uL (ref 0.7–4.0)
MCH: 23.6 pg — ABNORMAL LOW (ref 26.0–34.0)
MCHC: 31.4 g/dL (ref 30.0–36.0)
MCV: 75.1 fL — ABNORMAL LOW (ref 80.0–100.0)
Monocytes Absolute: 1.8 10*3/uL — ABNORMAL HIGH (ref 0.1–1.0)
Monocytes Relative: 10 %
Neutro Abs: 14.3 10*3/uL — ABNORMAL HIGH (ref 1.7–7.7)
Neutrophils Relative %: 82 %
Platelets: 363 10*3/uL (ref 150–400)
RBC: 3.69 MIL/uL — ABNORMAL LOW (ref 4.22–5.81)
RDW: 17.5 % — ABNORMAL HIGH (ref 11.5–15.5)
WBC: 17.4 10*3/uL — ABNORMAL HIGH (ref 4.0–10.5)
nRBC: 0 % (ref 0.0–0.2)

## 2021-07-26 LAB — CYTOLOGY - NON PAP

## 2021-07-26 LAB — BASIC METABOLIC PANEL WITH GFR
Anion gap: 9 (ref 5–15)
BUN: 25 mg/dL — ABNORMAL HIGH (ref 6–20)
CO2: 33 mmol/L — ABNORMAL HIGH (ref 22–32)
Calcium: 8.5 mg/dL — ABNORMAL LOW (ref 8.9–10.3)
Chloride: 93 mmol/L — ABNORMAL LOW (ref 98–111)
Creatinine, Ser: 1.29 mg/dL — ABNORMAL HIGH (ref 0.61–1.24)
GFR, Estimated: >60 mL/min (ref >60)
Glucose, Bld: 299 mg/dL — ABNORMAL HIGH (ref 70–99)
Potassium: 4.3 mmol/L (ref 3.5–5.1)
Sodium: 135 mmol/L (ref 135–145)

## 2021-07-26 LAB — GLUCOSE, CAPILLARY
Glucose-Capillary: 153 mg/dL — ABNORMAL HIGH (ref 70–99)
Glucose-Capillary: 190 mg/dL — ABNORMAL HIGH (ref 70–99)
Glucose-Capillary: 208 mg/dL — ABNORMAL HIGH (ref 70–99)
Glucose-Capillary: 264 mg/dL — ABNORMAL HIGH (ref 70–99)
Glucose-Capillary: 265 mg/dL — ABNORMAL HIGH (ref 70–99)
Glucose-Capillary: 65 mg/dL — ABNORMAL LOW (ref 70–99)
Glucose-Capillary: 98 mg/dL (ref 70–99)

## 2021-07-26 MED ORDER — FUROSEMIDE 40 MG PO TABS
40.0000 mg | ORAL_TABLET | Freq: Every day | ORAL | Status: DC
  Administered 2021-07-26 – 2021-07-30 (×5): 40 mg via ORAL
  Filled 2021-07-26 (×5): qty 1

## 2021-07-26 NOTE — Progress Notes (Signed)
Inpatient Diabetes Program Recommendations  AACE/ADA: New Consensus Statement on Inpatient Glycemic Control (2015)  Target Ranges:  Prepandial:   less than 140 mg/dL      Peak postprandial:   less than 180 mg/dL (1-2 hours)      Critically ill patients:  140 - 180 mg/dL   Lab Results  Component Value Date   GLUCAP 264 (H) 07/26/2021   HGBA1C 8.0 (H) 07/19/2021    Review of Glycemic Control  Latest Reference Range & Units 07/25/21 10:50 07/25/21 15:24 07/25/21 19:57 07/25/21 23:42 07/26/21 03:54 07/26/21 08:11  Glucose-Capillary 70 - 99 mg/dL 175 (H) 381 (H) 383 (H) 307 (H) 265 (H) 264 (H)  (H): Data is abnormally high Diabetes history: Type 2 DM Outpatient Diabetes medications: Glipizide 10 mg QD, Rybelsus 14 mg QD Current orders for Inpatient glycemic control: Novolog 0-9 units Q4H, Semglee 30 units QD, Novolog 5 units TID  Inpatient Diabetes Program Recommendations:   Noted patient received Decadron 10 mg x 1 yesterday. Patient's CBGs increased -did not receive Lantus when returned from procedure and did not receive Novolog ac dinner. Review trends today.  Thank you, Nani Gasser. Karisma Meiser, RN, MSN, CDE  Diabetes Coordinator Inpatient Glycemic Control Team Team Pager [phone] (8am-5pm) 07/26/2021 9:35 AM

## 2021-07-26 NOTE — Progress Notes (Signed)
1 Day Post-Op Procedure(s) (LRB): DRAINAGE OF PLEURAL EFFUSION (Right) PLEURAL BIOPSY (Right) VIDEO ASSISTED THORACOSCOPY (VATS)/EMPYEMA (Right) Subjective: Pain under control. Sitting up eating.  Objective: Vital signs in last 24 hours: Temp:  [97.9 F (36.6 C)-98.5 F (36.9 C)] 98 F (36.7 C) (01/16 0726) Pulse Rate:  [79-100] 82 (01/16 0726) Cardiac Rhythm: Normal sinus rhythm (01/16 0714) Resp:  [11-22] 17 (01/16 0726) BP: (102-126)/(41-89) 111/80 (01/16 0726) SpO2:  [95 %-100 %] 100 % (01/16 0726)  Hemodynamic parameters for last 24 hours:    Intake/Output from previous day: 01/15 0701 - 01/16 0700 In: 1900 [P.O.:900; I.V.:1000] Out: 3710 [Urine:1800; Blood:10; Chest Tube:300] Intake/Output this shift: No intake/output data recorded.  General appearance: alert and cooperative Heart: regular rate and rhythm, S1, S2 normal, no murmur Lungs: good breath sounds bilaterally but some wheezing on right Chest tube output low, serosanguinous.  Lab Results: Recent Labs    07/25/21 0101 07/26/21 0012  WBC 12.5* 17.4*  HGB 8.5* 8.7*  HCT 27.3* 27.7*  PLT 322 363   BMET:  Recent Labs    07/25/21 0101 07/26/21 0012  NA 137 135  K 3.5 4.3  CL 93* 93*  CO2 34* 33*  GLUCOSE 98 299*  BUN 14 25*  CREATININE 0.97 1.29*  CALCIUM 8.6* 8.5*    PT/INR: No results for input(s): LABPROT, INR in the last 72 hours. ABG    Component Value Date/Time   PHART 7.451 (H) 07/18/2021 2118   HCO3 31.0 (H) 07/18/2021 2118   TCO2 32 07/18/2021 2118   O2SAT 99.0 07/18/2021 2118   CBG (last 3)  Recent Labs    07/25/21 1957 07/25/21 2342 07/26/21 0354  GLUCAP 383* 307* 265*   CXR: persistent opacity in right lung consistent with atelectasis or infiltrate.  Assessment/Plan: S/P Procedure(s) (LRB): DRAINAGE OF PLEURAL EFFUSION (Right) PLEURAL BIOPSY (Right) VIDEO ASSISTED THORACOSCOPY (VATS)/EMPYEMA (Right)  Chest tube output low. Continue to suction.   Work on IS. Get  flutter valve. Ambulate  Continue antibiotic.  Follow up on fluid cytology and pleural biopsies.   LOS: 7 days    Derek Owen 07/26/2021

## 2021-07-26 NOTE — Progress Notes (Signed)
As TCTS is managing chest tube and all path is pending, will follow peripherally.  Will arrange f/u in clinic in 2-3 weeks to review breathing and path results (he can cancel if he has f/u with TCTS).  Erskine Emery MD PCCM

## 2021-07-26 NOTE — TOC Transition Note (Signed)
Transition of Care [facility]) - CM/SW Discharge Note   Patient Details  Name: Derek Owen MRN: [id number] Date of Birth: January 15, 1966  Transition of Care [facility]) CM/SW Contact:  Angelita Ingles, RN Phone Number:[phone]  07/26/2021, 2:34 PM   Clinical Narrative:     Transition of Care [facility]) Screening Note   Patient Details  Name: Derek Owen Date of Birth: [birth year]   Transition of Care [facility]) CM/SW Contact:    Angelita Ingles, RN Phone Number: 07/26/2021, 2:34 PM    Transition of Care Department [facility]) has reviewed patient and no TOC needs have been identified at this time. We will continue to monitor patient advancement through interdisciplinary progression rounds. If new patient transition needs arise, please place a TOC consult.           Patient Goals and CMS Choice        Discharge Placement                       Discharge Plan and Services                                     Social Determinants of Health (SDOH) Interventions     Readmission Risk Interventions No flowsheet data found.

## 2021-07-26 NOTE — Progress Notes (Signed)
PROGRESS NOTE    Derek Owen  EPP:[id number] DOB: 03/24/1966 DOA: 07/18/2021 PCP: Renee Rival, FNP   Chief Complain: Respiratory distress  Brief Narrative: Patient is a 56 year old male with history of tobacco use, diabetes type 2, rheumatoid arthritis, recent history of COVID who presented to the emergency department at [facility] with respiratory status, hypoxic.  She was earlier seen in the ED on 1/2 and was found to have elevated D-dimer, chest x-ray showed left pleural effusion and was discharged to home with a steroid and albuterol.  On presentation, she had to be put on BiPAP.  Lab work showed elevated BNP.  CTA chest was negative for PE but showed bilateral pleural effusion, left upper abdominal calcified mass.  Lab work also showed leukocytosis.   He underwent bilateral thoracentesis, tap was bloody.  Currently on IV diuresis for volume overload.  Underwent  pleural biopsy and chest tube placement by cardiothoracic surgery on 1/15.  Pleural biopsy pending.  Assessment & Plan:   Principal Problem:   Acute respiratory failure with hypoxia (HCC) Active Problems:   DM2 (diabetes mellitus, type 2) ([place])   Hypertension associated with diabetes ([place])   Bilateral pleural effusion   Mass of abdomen   Pleural effusion   Acute hypoxic respiratory failure/bilateral pleural effusion: Secondary to bilateral pleural effusion +/- pneumonia. He required BiPAP on presentation.  Underwent bilateral thoracentesis on 1/9 .  Underwent repeat left thoracentesis on 1/10 with removal of minimal bloody fluid.  Cytology does not show malignant cells from earlier thoracentesis,repeat is pending.  Culture did not show any organism.  QuantiFERON TB Gold negative. CT chest also showed bilateral lower lobe opacity suspicious for pneumonia.On ceftriaxone. Patient has history of rheumatoid arthritis, which could be the etiology for bloody pleural fluid  Recent history of COVID, was taking Paxlovid  and  prednisone. CXR on 1/14 showed reaccumulation of pleural effusion on the right side with loculation, progressive atelectasis.  Underwent right sided  pleural biopsy and chest tube placement on 1/15, biopsy pending..  Left upper abdominal calcified mass/left kidney cyst: CT chest/abdomen/pelvis with contrast showed indeterminate partially calcified mass involving the upper pole of the left kidney. CT kidney showed 10.6 cm complex cystic lesion in upper pole of left kidney, consistent with an indeterminate but probably benign Bosniak category 3F lesion. Recommend continued imaging follow-up in 6 months, preferably with abdomen MRI without and with contrast.   Lower extremity edema: Normal BNP.  2D echo showed normal ejection fraction, asymmetric left ventricular hypertrophy.  Looked significantly volume overloaded with bilateral lower extremity edema.  Was on Lasix 60 mg IV twice daily, stopped due to increasing creatinine, changed to oral Lasix 40 mg daily  Microcytic anemia: Hemoglobin currently in the range of 8-9.  Iron studies showed low iron.  Given IV iron.  No report of hematochezia or melena or acute blood loss.  CT abdomen/pelvis did not show any source of potential bleed. No history of EGD and colonoscopy in the past. Continue oral iron supplementation on DC  Type 2 diabetes: Takes oral diabetic medications at home.  Continue current insulin regimen.  Hemoglobin A1c of 8  Hypertension: Currently BP stable.  Antihypertensives on hold  History of alcohol use: Continue thiamine, folic acid, on CIWA protocol  History of hyperlipidemia: Takes Lipitor at home.  History of rheumatoid arthritis: Takes methotrexate.  We recommend to follow-up with rheumatology as an outpatient.  Hypokalemia: Being supplemented and monitored as needed.  Obesity: BMI of 42.5  Deconditioning/debility:  PT/OT evaluated him, no follow-up recommended.Needs reevaluation            DVT  prophylaxis:Lovenox Code Status: Full Family Communication: None at the bedside Patient status:Inpatient  Dispo: The patient is from: Home              Anticipated d/c is to: Home              Anticipated d/c date is: Needs to get rid of chest tube  Consultants: PCCM  Procedures: Thoracentesis  Antimicrobials:  Anti-infectives (From admission, onward)    Start     Dose/Rate Route Frequency Ordered Stop   07/25/21 0743  sodium chloride 0.9 % with cefTRIAXone (ROCEPHIN) ADS Med       Note to Pharmacy: Rejeana Brock L: cabinet override      07/25/21 0743 07/25/21 1959   07/21/21 1630  cefTRIAXone (ROCEPHIN) 1 g in sodium chloride 0.9 % 100 mL IVPB        1 g 200 mL/hr over 30 Minutes Intravenous Every 24 hours 07/21/21 1540     07/19/21 0330  cefTRIAXone (ROCEPHIN) 1 g in sodium chloride 0.9 % 100 mL IVPB        1 g 200 mL/hr over 30 Minutes Intravenous  Once 07/19/21 0323 07/19/21 0502   07/19/21 0330  azithromycin (ZITHROMAX) 500 mg in sodium chloride 0.9 % 250 mL IVPB        500 mg 250 mL/hr over 60 Minutes Intravenous  Once 07/19/21 0323 07/19/21 2831       Subjective:  Patient seen and examined at the bedside this morning.  Hemodynamically stable.  He looks better today.  Looks overall comfortable, sitting at the edge of the bed.  Volume overload looks to have improved.  Denies any complaints today.  Objective: Vitals:   07/25/21 2000 07/25/21 2343 07/26/21 0355 07/26/21 0726  BP: 116/72 119/82 116/72 111/80  Pulse: 93 87 79 82  Resp: 20 18 20 17   Temp: 98.3 F (36.8 C) 98.5 F (36.9 C) 98.3 F (36.8 C) 98 F (36.7 C)  TempSrc: Oral Oral Oral   SpO2: 99% 99% 100% 100%  Weight:      Height:        Intake/Output Summary (Last 24 hours) at 07/26/2021 0729 Last data filed at 07/26/2021 0445 Gross per 24 hour  Intake 1900 ml  Output 3710 ml  Net -1810 ml   Filed Weights   07/18/21 2049 07/25/21 0732  Weight: (!) 142.4 kg (!) 142.4 kg     Examination:   General exam: Morbidly obese, weak, deconditioned HEENT: PERRL Respiratory system: Chest tube on the right side ,no wheezes or crackles, good air entry both sides Cardiovascular system: S1 & S2 heard, RRR.  Gastrointestinal system: Abdomen is nondistended, soft and nontender. Central nervous system: Alert and oriented Extremities: Trace bilateral lower extremity edema, no clubbing ,no cyanosis Skin: No rashes, no ulcers,no icterus    Data Reviewed: I have personally reviewed following labs and imaging studies  CBC: Recent Labs  Lab 07/21/21 0101 07/22/21 0136 07/23/21 0110 07/24/21 0109 07/25/21 0101 07/26/21 0012  WBC 13.7* 9.1 10.4 12.3* 12.5* 17.4*  NEUTROABS 10.8*  --  7.7 9.3* 9.3* 14.3*  HGB 8.9* 8.1* 8.6* 8.7* 8.5* 8.7*  HCT 28.1* 25.7* 27.8* 28.0* 27.3* 27.7*  MCV 75.1* 74.7* 75.5* 74.5* 74.8* 75.1*  PLT 204 192 237 295 322 517   Basic Metabolic Panel: Recent Labs  Lab 07/21/21 0101 07/22/21 0136 07/23/21 0110 07/24/21 0109  07/25/21 0101 07/26/21 0012  NA 132* 133* 134* 137 137 135  K 3.9 3.2* 3.3* 3.5 3.5 4.3  CL 93* 95* 92* 94* 93* 93*  CO2 31 30 32 34* 34* 33*  GLUCOSE 160* 206* 220* 152* 98 299*  BUN 23* 18 14 14 14  25*  CREATININE 1.16 0.95 1.00 0.98 0.97 1.29*  CALCIUM 8.4* 7.8* 8.0* 8.6* 8.6* 8.5*  MG  --   --   --  2.0  --   --   PHOS 3.7  --   --  3.7  --   --    GFR: Estimated Creatinine Clearance: 93.6 mL/min (A) (by C-G formula based on SCr of 1.29 mg/dL (H)). Liver Function Tests: Recent Labs  Lab 07/19/21 1212  PROT 6.4*   No results for input(s): LIPASE, AMYLASE in the last 168 hours. No results for input(s): AMMONIA in the last 168 hours. Coagulation Profile: No results for input(s): INR, PROTIME in the last 168 hours. Cardiac Enzymes: No results for input(s): CKTOTAL, CKMB, CKMBINDEX, TROPONINI in the last 168 hours. BNP (last 3 results) No results for input(s): PROBNP in the last 8760 hours. HbA1C: No  results for input(s): HGBA1C in the last 72 hours.  CBG: Recent Labs  Lab 07/25/21 1050 07/25/21 1524 07/25/21 1957 07/25/21 2342 07/26/21 0354  GLUCAP 175* 381* 383* 307* 265*   Lipid Profile: No results for input(s): CHOL, HDL, LDLCALC, TRIG, CHOLHDL, LDLDIRECT in the last 72 hours. Thyroid Function Tests: No results for input(s): TSH, T4TOTAL, FREET4, T3FREE, THYROIDAB in the last 72 hours. Anemia Panel: No results for input(s): VITAMINB12, FOLATE, FERRITIN, TIBC, IRON, RETICCTPCT in the last 72 hours.  Sepsis Labs: Recent Labs  Lab 07/19/21 0810  LATICACIDVEN 1.9    Recent Results (from the past 240 hour(s))  Resp Panel by RT-PCR (Flu A&B, Covid) Nasopharyngeal Swab     Status: None   Collection Time: 07/18/21  8:48 PM   Specimen: Nasopharyngeal Swab; Nasopharyngeal(NP) swabs in vial transport medium  Result Value Ref Range Status   SARS Coronavirus 2 by RT PCR NEGATIVE NEGATIVE Final    Comment: (NOTE) SARS-CoV-2 target nucleic acids are NOT DETECTED.  The SARS-CoV-2 RNA is generally detectable in upper respiratory specimens during the acute phase of infection. The lowest concentration of SARS-CoV-2 viral copies this assay can detect is 138 copies/mL. A negative result does not preclude SARS-Cov-2 infection and should not be used as the sole basis for treatment or other patient management decisions. A negative result may occur with  improper specimen collection/handling, submission of specimen other than nasopharyngeal swab, presence of viral mutation(s) within the areas targeted by this assay, and inadequate number of viral copies(<138 copies/mL). A negative result must be combined with clinical observations, patient history, and epidemiological information. The expected result is Negative.  Fact Sheet for Patients:  EntrepreneurPulse.com.au  Fact Sheet for Healthcare Providers:  IncredibleEmployment.be  This test is no t  yet approved or cleared by the [place] FDA and  has been authorized for detection and/or diagnosis of SARS-CoV-2 by FDA under an Emergency Use Authorization (EUA). This EUA will remain  in effect (meaning this test can be used) for the duration of the COVID-19 declaration under Section 564(b)(1) of the Act, 21 U.S.C.section 360bbb-3(b)(1), unless the authorization is terminated  or revoked sooner.       Influenza A by PCR NEGATIVE NEGATIVE Final   Influenza B by PCR NEGATIVE NEGATIVE Final    Comment: (NOTE) The Xpert Xpress SARS-CoV-2/FLU/RSV  plus assay is intended as an aid in the diagnosis of influenza from Nasopharyngeal swab specimens and should not be used as a sole basis for treatment. Nasal washings and aspirates are unacceptable for Xpert Xpress SARS-CoV-2/FLU/RSV testing.  Fact Sheet for Patients: EntrepreneurPulse.com.au  Fact Sheet for Healthcare Providers: IncredibleEmployment.be  This test is not yet approved or cleared by the [place] FDA and has been authorized for detection and/or diagnosis of SARS-CoV-2 by FDA under an Emergency Use Authorization (EUA). This EUA will remain in effect (meaning this test can be used) for the duration of the COVID-19 declaration under Section 564(b)(1) of the Act, 21 U.S.C. section 360bbb-3(b)(1), unless the authorization is terminated or revoked.  Performed at [facility], [place] [address], [place], [place] [address]   Body fluid culture w Gram Stain     Status: None   Collection Time: 07/19/21 11:02 AM   Specimen: Pleural Fluid  Result Value Ref Range Status   Specimen Description PLEURAL FLUID  Final   Special Requests LEFT  Final   Gram Stain   Final    ABUNDANT WBC PRESENT,BOTH PMN AND MONONUCLEAR NO ORGANISMS SEEN    Culture   Final    NO GROWTH Performed at [facility], [place] [address] [place], [place] [address]    Report Status 07/22/2021 FINAL  Final   Body fluid culture w Gram Stain     Status: None   Collection Time: 07/19/21  3:25 PM   Specimen: Pleural Fluid  Result Value Ref Range Status   Specimen Description FLUID PLEURAL  Final   Special Requests NONE  Final   Gram Stain   Final    RARE WBC PRESENT,BOTH PMN AND MONONUCLEAR NO ORGANISMS SEEN    Culture   Final    NO GROWTH 3 DAYS Performed at [facility], [place] [address] [place], [place] [address]    Report Status 07/23/2021 FINAL  Final  MRSA Next Gen by PCR, Nasal     Status: None   Collection Time: 07/20/21  2:58 PM   Specimen: Nasal Mucosa; Nasal Swab  Result Value Ref Range Status   MRSA by PCR Next Gen NOT DETECTED NOT DETECTED Final    Comment: (NOTE) The GeneXpert MRSA Assay (FDA approved for NASAL specimens only), is one component of a comprehensive MRSA colonization surveillance program. It is not intended to diagnose MRSA infection nor to guide or monitor treatment for MRSA infections. Test performance is not FDA approved in patients less than 38 years old. Performed at [facility], [place] [address], [place], [place] [address]          Radiology Studies: DG Chest Port 1 View  Result Date: 07/25/2021 CLINICAL DATA:  Postop imaging. EXAM: PORTABLE CHEST 1 VIEW COMPARISON:  07/24/2021. FINDINGS: Significant improvement in right lung aeration since the prior study. The near complete opacification of the right hemithorax has been replaced by patchy airspace opacities throughout and aerated right lung, with more confluent opacity at the right lung base. No pneumothorax. Right-sided chest tube has its tip near the right apex. IMPRESSION: 1. Significant improvement in right lung aeration consistent with evacuation of a right pleural effusion. 2. No pneumothorax.  Well-positioned right-sided chest tube. 3. Residual opacity noted throughout the right lung is consistent with atelectasis or infection. Electronically Signed   By: Lajean Manes M.D.   On:  07/25/2021 10:10   CT RENAL ABD W/WO  Result Date: 07/24/2021 CLINICAL DATA:  Indeterminate left renal lesion on recent CT. EXAM: CT ABDOMEN WITHOUT AND WITH CONTRAST TECHNIQUE: Multidetector CT imaging of the abdomen was performed following the standard protocol before and following the bolus administration of intravenous contrast. RADIATION DOSE REDUCTION: This exam was performed according to the departmental dose-optimization program which includes automated exposure control, adjustment of the mA and/or kV according to patient size and/or use of iterative reconstruction technique. CONTRAST:  73mL OMNIPAQUE IOHEXOL 350 MG/ML SOLN COMPARISON:  07/21/2021 FINDINGS: Lower chest: Increased size of large right pleural effusion with compressive atelectasis right lower lung. Decreased airspace disease in posterior left lower lobe. Persistent tiny left pleural effusion. Hepatobiliary: No hepatic masses identified. Gallbladder is unremarkable. No evidence of biliary ductal dilatation. Pancreas:  No mass or inflammatory changes. Spleen:  Within normal limits in size and appearance. Adrenals/Urinary Tract: Normal appearance of adrenal glands and right kidney. A large low-attenuation lesion is seen in the upper pole of the left kidney, which measures 10.7 x 10.6 cm. This shows thin peripheral rim calcifications, as well as scattered internal calcifications likely within internal septations. This lesion shows no evidence of internal contrast enhancement. This meets criteria for a probably benign Bosniak category 38F lesion. No evidence of hydronephrosis. Stomach/Bowel: Diverticulosis is seen involving the visualized portion of the transverse colon, without signs of diverticulitis in this region. Vascular/Lymphatic: No pathologically enlarged lymph nodes identified. No acute vascular findings. Aortic atherosclerotic calcification noted. Other:  None. Musculoskeletal:  No suspicious bone lesions identified. IMPRESSION: 10.6  cm complex cystic lesion in upper pole of left kidney, consistent with an indeterminate but probably benign Bosniak category 38F lesion. Recommend continued imaging follow-up in 6 months, preferably with abdomen MRI without and with contrast. Increased size of large right pleural effusion and compressive atelectasis. Decreased airspace disease in posterior left lower lobe. Persistent tiny left pleural effusion. Aortic Atherosclerosis (ICD10-I70.0). Electronically Signed   By: Marlaine Hind M.D.   On: 07/24/2021 15:25        Scheduled Meds:  acetaminophen  1,000 mg Oral Q6H   Or   acetaminophen (TYLENOL) oral liquid 160 mg/5 mL  1,000 mg Oral Q6H   bisacodyl  10 mg Oral Daily   enoxaparin (LOVENOX) injection  60 mg Subcutaneous Y86V   folic acid  1 mg Oral Daily   insulin aspart  0-9 Units Subcutaneous Q4H   insulin aspart  5 Units Subcutaneous TID WC   insulin glargine-yfgn  30 Units Subcutaneous Daily   mouth rinse  15 mL Mouth Rinse BID   multivitamin with minerals  1 tablet Oral Daily   potassium chloride  40 mEq Oral Daily   senna-docusate  1 tablet Oral QHS   thiamine  100 mg Oral Daily   Or   thiamine  100 mg Intravenous Daily   Continuous Infusions:  cefTRIAXone (ROCEPHIN)  IV 1 g (07/23/21 1633)   dextrose 5 % and 0.9% NaCl Stopped (07/25/21 1906)     LOS: 7 days    Time spent: 35 mins.More than 50% of that time was spent in counseling and/or coordination of care.      Shelly Coss, MD [facility] P1/16/2023, 7:29 AM

## 2021-07-26 NOTE — Plan of Care (Signed)
°  Problem: Health Behavior/Discharge Planning: Goal: Ability to manage health-related needs will improve Outcome: Progressing   Problem: Clinical Measurements: Goal: Will remain free from infection Outcome: Progressing   Problem: Activity: Goal: Risk for activity intolerance will decrease Outcome: Progressing   Problem: Nutrition: Goal: Adequate nutrition will be maintained Outcome: Progressing   Problem: Coping: Goal: Level of anxiety will decrease Outcome: Progressing

## 2021-07-26 NOTE — Progress Notes (Signed)
Pt wearing oxygen.  Not requiring BIPAP at this time.

## 2021-07-27 ENCOUNTER — Inpatient Hospital Stay (HOSPITAL_COMMUNITY): Payer: Managed Care, Other (non HMO)

## 2021-07-27 ENCOUNTER — Telehealth: Payer: Self-pay

## 2021-07-27 DIAGNOSIS — J90 Pleural effusion, not elsewhere classified: Secondary | ICD-10-CM | POA: Diagnosis not present

## 2021-07-27 DIAGNOSIS — J9601 Acute respiratory failure with hypoxia: Secondary | ICD-10-CM | POA: Diagnosis not present

## 2021-07-27 LAB — BASIC METABOLIC PANEL
Anion gap: 17 — ABNORMAL HIGH (ref 5–15)
BUN: 21 mg/dL — ABNORMAL HIGH (ref 6–20)
CO2: 28 mmol/L (ref 22–32)
Calcium: 9 mg/dL (ref 8.9–10.3)
Chloride: 93 mmol/L — ABNORMAL LOW (ref 98–111)
Creatinine, Ser: 1.03 mg/dL (ref 0.61–1.24)
GFR, Estimated: >60 mL/min (ref >60)
Glucose, Bld: 93 mg/dL (ref 70–99)
Potassium: 4.9 mmol/L (ref 3.5–5.1)
Sodium: 138 mmol/L (ref 135–145)

## 2021-07-27 LAB — GLUCOSE, CAPILLARY
Glucose-Capillary: 182 mg/dL — ABNORMAL HIGH (ref 70–99)
Glucose-Capillary: 133 mg/dL — ABNORMAL HIGH (ref 70–99)
Glucose-Capillary: 170 mg/dL — ABNORMAL HIGH (ref 70–99)
Glucose-Capillary: 256 mg/dL — ABNORMAL HIGH (ref 70–99)
Glucose-Capillary: 186 mg/dL — ABNORMAL HIGH (ref 70–99)

## 2021-07-27 MED ORDER — INSULIN ASPART 100 UNIT/ML IJ SOLN
0.0000 [IU] | Freq: Three times a day (TID) | INTRAMUSCULAR | Status: DC
  Administered 2021-07-27: 2 [IU] via SUBCUTANEOUS
  Administered 2021-07-27: 5 [IU] via SUBCUTANEOUS
  Administered 2021-07-28: 1 [IU] via SUBCUTANEOUS
  Administered 2021-07-28 (×2): 2 [IU] via SUBCUTANEOUS
  Administered 2021-07-29: 1 [IU] via SUBCUTANEOUS
  Administered 2021-07-29: 5 [IU] via SUBCUTANEOUS
  Administered 2021-07-29 – 2021-07-30 (×2): 2 [IU] via SUBCUTANEOUS
  Administered 2021-07-30: 1 [IU] via SUBCUTANEOUS

## 2021-07-27 MED ORDER — ZOLPIDEM TARTRATE 5 MG PO TABS
5.0000 mg | ORAL_TABLET | Freq: Every day | ORAL | Status: DC
  Administered 2021-07-27 – 2021-07-29 (×3): 5 mg via ORAL
  Filled 2021-07-27 (×3): qty 1

## 2021-07-27 NOTE — Progress Notes (Signed)
Inpatient Diabetes Program Recommendations  AACE/ADA: New Consensus Statement on Inpatient Glycemic Control (2015)  Target Ranges:  Prepandial:   less than 140 mg/dL      Peak postprandial:   less than 180 mg/dL (1-2 hours)      Critically ill patients:  140 - 180 mg/dL   Lab Results  Component Value Date   GLUCAP 133 (H) 07/27/2021   HGBA1C 8.0 (H) 07/19/2021    Review of Glycemic Control  Latest Reference Range & Units 07/26/21 08:11 07/26/21 11:32 07/26/21 16:10 07/26/21 19:21 07/26/21 23:24 07/26/21 23:54 07/27/21 03:23 07/27/21 07:57  Glucose-Capillary 70 - 99 mg/dL 264 (H) 208 (H) 190 (H) 153 (H) 65 (L) 98 182 (H) 133 (H)  (H): Data is abnormally high (L): Data is abnormally low Diabetes history: Type 2 DM Outpatient Diabetes medications: Glipizide 10 mg QD, Rybelsus 14 mg QD Current orders for Inpatient glycemic control: Novolog 0-9 units Q4H, Semglee 30 units QD, Novolog 5 units TID  Inpatient Diabetes Program Recommendations:   Please consider since patient is eating and had hypoglycemia post correction: -Change Novolog correction to 0-9 units tid with meals + hs 0-5 units  Secure chat sent to Dr. Tawanna Solo.  Thank you, Nani Gasser. Brexton Sofia, RN, MSN, CDE  Diabetes Coordinator Inpatient Glycemic Control Team Team Pager [phone] (8am-5pm) 07/27/2021 8:55 AM

## 2021-07-27 NOTE — Progress Notes (Signed)
No bipap needed at this time. Pt vs stable no resp. distress noted.

## 2021-07-27 NOTE — Telephone Encounter (Signed)
-----   Message from Candee Furbish, MD sent at 07/26/2021  8:46 AM EST ----- Regarding: 2-3 week f/u with RA or midlevel Review breathing and f/u pleural biopsy results.  Linna Hoff

## 2021-07-27 NOTE — Progress Notes (Signed)
Mobility Specialist Progress Note    07/27/21 1103  Mobility  Activity Ambulated with assistance in hallway  Level of Assistance Contact guard assist, steadying assist  Assistive Device Front wheel walker  Distance Ambulated (ft) 170 ft  Activity Response Tolerated fair  $Mobility charge 1 Mobility   Pre-Mobility: 92 HR, 93% SpO2 Post-Mobility: 93 HR, 90% SpO2  Pt received sitting EOB and agreeable. Took x3 short standing breaks. Ambulated on RA. SpO2 did drop to 85-86% but would recover with pursed lip breathing. Returned to sitting EOB with call bell in reach. Left on 1LO2 in room.   [facility] Mobility Specialist  M.S. 2C and 6E: [phone] M.S. 4E: [phone]

## 2021-07-27 NOTE — Telephone Encounter (Signed)
Patient has been scheduled for OV, it will print out on discharge summary. Nothing further needed.  Next Appt With Pulmonology Clayton Bibles, NP) 08/13/2021 at 3:00 PM

## 2021-07-27 NOTE — Progress Notes (Addendum)
Gordon HeightsSuite 411       [place] [address]             [phone]      2 Days Post-Op Procedure(s) (LRB): DRAINAGE OF PLEURAL EFFUSION (Right) PLEURAL BIOPSY (Right) VIDEO ASSISTED THORACOSCOPY (VATS)/EMPYEMA (Right) Subjective: Feels fairly well , not sleeping well, some lateral thigh discomfort bilat  Objective: Vital signs in last 24 hours: Temp:  [98.2 F (36.8 C)-98.7 F (37.1 C)] 98.7 F (37.1 C) (01/17 0325) Pulse Rate:  [87-97] 97 (01/17 0325) Cardiac Rhythm: Normal sinus rhythm (01/17 0706) Resp:  [17-20] 20 (01/17 0325) BP: (102-124)/(43-93) 123/93 (01/17 0325) SpO2:  [93 %-100 %] 93 % (01/17 0325)  Hemodynamic parameters for last 24 hours:    Intake/Output from previous day: 01/16 0701 - 01/17 0700 In: 820 [P.O.:720; IV Piggyback:100] Out: 1390 [Urine:1250; Chest Tube:140] Intake/Output this shift: No intake/output data recorded.  General appearance: alert, cooperative, and no distress Heart: regular rate and rhythm Lungs: minor coarseness in bases Abdomen: benign, obese Extremities: + edema Wound: dressings intact  Lab Results: Recent Labs    07/25/21 0101 07/26/21 0012  WBC 12.5* 17.4*  HGB 8.5* 8.7*  HCT 27.3* 27.7*  PLT 322 363   BMET:  Recent Labs    07/26/21 0012 07/27/21 0114  NA 135 138  K 4.3 4.9  CL 93* 93*  CO2 33* 28  GLUCOSE 299* 93  BUN 25* 21*  CREATININE 1.29* 1.03  CALCIUM 8.5* 9.0    PT/INR: No results for input(s): LABPROT, INR in the last 72 hours. ABG    Component Value Date/Time   PHART 7.451 (H) 07/18/2021 2118   HCO3 31.0 (H) 07/18/2021 2118   TCO2 32 07/18/2021 2118   O2SAT 99.0 07/18/2021 2118   CBG (last 3)  Recent Labs    07/26/21 2324 07/26/21 2354 07/27/21 0323  GLUCAP 65* 98 182*    Meds Scheduled Meds:  acetaminophen  1,000 mg Oral Q6H   Or   acetaminophen (TYLENOL) oral liquid 160 mg/5 mL  1,000 mg Oral Q6H   bisacodyl  10 mg Oral Daily   enoxaparin (LOVENOX)  injection  60 mg Subcutaneous X44Y   folic acid  1 mg Oral Daily   furosemide  40 mg Oral Daily   insulin aspart  0-9 Units Subcutaneous Q4H   insulin aspart  5 Units Subcutaneous TID WC   insulin glargine-yfgn  30 Units Subcutaneous Daily   mouth rinse  15 mL Mouth Rinse BID   multivitamin with minerals  1 tablet Oral Daily   potassium chloride  40 mEq Oral Daily   senna-docusate  1 tablet Oral QHS   thiamine  100 mg Oral Daily   Or   thiamine  100 mg Intravenous Daily   Continuous Infusions:  cefTRIAXone (ROCEPHIN)  IV 1 g (07/26/21 1642)   PRN Meds:.acetaminophen **OR** acetaminophen, morphine injection, ondansetron (ZOFRAN) IV, ondansetron **OR** [DISCONTINUED] ondansetron (ZOFRAN) IV, oxyCODONE  Xrays DG Chest Port 1 View  Result Date: 07/26/2021 CLINICAL DATA:  Follow-up right chest tube. EXAM: PORTABLE CHEST 1 VIEW COMPARISON:  07/25/2021 FINDINGS: Stable cardiomediastinal contours right chest tube is in place and stable from previous exam. Tiny pneumothorax is identified which measures approximately 4 mm over the apical portions of the right lung. The rib this does not appear significantly changed from previous exam. Opacities within the right midlung and right lower lung are unchanged. Left lung is clear. IMPRESSION: 1. Stable right chest tube  with tiny right apical pneumothorax. 2. No change in aeration to the right lung. Electronically Signed   By: Kerby Moors M.D.   On: 07/26/2021 08:48   DG Chest Port 1 View  Result Date: 07/25/2021 CLINICAL DATA:  Postop imaging. EXAM: PORTABLE CHEST 1 VIEW COMPARISON:  07/24/2021. FINDINGS: Significant improvement in right lung aeration since the prior study. The near complete opacification of the right hemithorax has been replaced by patchy airspace opacities throughout and aerated right lung, with more confluent opacity at the right lung base. No pneumothorax. Right-sided chest tube has its tip near the right apex. IMPRESSION: 1.  Significant improvement in right lung aeration consistent with evacuation of a right pleural effusion. 2. No pneumothorax.  Well-positioned right-sided chest tube. 3. Residual opacity noted throughout the right lung is consistent with atelectasis or infection. Electronically Signed   By: Lajean Manes M.D.   On: 07/25/2021 10:10    Assessment/Plan: S/P Procedure(s) (LRB): DRAINAGE OF PLEURAL EFFUSION (Right) PLEURAL BIOPSY (Right) VIDEO ASSISTED THORACOSCOPY (VATS)/EMPYEMA (Right) POD3@  1 afeb, VSS, sinus rhythm 2 sats good on 2 liters 3 CT 140 cc/24 h- poss d/c soon, no air leak 4 CXR some improvement in aeration/atx 5 normal renal fxn 6 BS 65- 265 range 7 pathology pending 8 medical management per primary      LOS: 8 days    John Giovanni PA-C Pager [phone] 07/27/2021    Chart reviewed, patient examined, agree with above. CXR shows improved aeration compared to yesterday. Chest tube output low. Can probably remove tomorrow if drainage stays low.  Pathology pending.

## 2021-07-27 NOTE — Progress Notes (Signed)
Hypoglycemic Event  CBG: 65  Treatment: 8 oz juice/soda  Symptoms: None  Follow-up CBG: Time:2354 CBG Result:98  Possible Reasons for Event: Unknown  Comments/MD notified: Patient asymptomatic    Warren Danes

## 2021-07-27 NOTE — Progress Notes (Signed)
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  She was earlier seen in the ED on 1/2 and was found to have elevated D-dimer, chest x-ray showed left pleural effusion and was discharged to home with a steroid and albuterol.  On re-presentation, he had to be put on BiPAP.  Lab work showed elevated BNP.  CTA chest was negative for PE but showed bilateral pleural effusion, left upper abdominal calcified mass.  Lab work also showed leukocytosis.   He underwent bilateral thoracentesis, tap was bloody.  Due to persistent reaccumulation of pleural effusion, he  underwent  pleural biopsy and chest tube placement by cardiothoracic surgery on 1/15.  CT chest following.  Pleural biopsy pending.  Assessment & Plan:   Principal Problem:   Acute respiratory failure with hypoxia (HCC) Active Problems:   DM2 (diabetes mellitus, type 2) ([place])   Hypertension associated with diabetes ([place])   Bilateral pleural effusion   Mass of abdomen   Pleural effusion   Acute hypoxic respiratory failure/bilateral pleural effusion: Secondary to bilateral pleural effusion +/- pneumonia. He required BiPAP on presentation.  Underwent bilateral thoracentesis on 1/9 .  Underwent repeat left thoracentesis on 1/10 with removal of bloody fluid.  Cytology did not show malignant cells from earlier thoracentesis.  Culture did not show any organism.  CT chest also showed bilateral lower lobe opacity suspicious for pneumonia.On ceftriaxone. Patient has history of rheumatoid arthritis, which could be also be  the etiology for bloody pleural fluid . Recent history of COVID, was taking  Paxlovid  and prednisone. CXR on 1/14 showed reaccumulation of pleural effusion on the right side with loculation, progressive atelectasis.  Underwent right sided  pleural biopsy and chest tube placement on 1/15, biopsy pending.  Currently on 2 L of oxygen per minute  Left upper abdominal calcified mass/left kidney cyst: CT chest/abdomen/pelvis with contrast showed indeterminate partially calcified mass involving the upper pole of the left kidney. CT kidney showed 10.6 cm complex cystic lesion in upper pole of left kidney, consistent with an indeterminate but probably benign Bosniak category 36F lesion.  Radiology recommended continued imaging follow-up in 6 months, preferably with abdomen MRI without and with contrast.   Lower extremity edema: Normal BNP.  2D echo showed normal ejection fraction, asymmetric left ventricular hypertrophy.  Looked significantly volume overloaded with bilateral lower extremity edema.  Was on Lasix 60 mg IV twice daily, stopped due to increasing creatinine, changed to oral Lasix 40 mg daily.  Edema has significantly improved.  Microcytic anemia: Hemoglobin currently in the range of 8-9.  Iron studies showed low iron.  Given IV iron.  No report of hematochezia or melena or acute blood loss.  CT abdomen/pelvis did not show any source of potential bleed. No history of EGD and colonoscopy in the past. Continue oral iron supplementation on DC  Type 2 diabetes: Takes oral diabetic medications at home.  Continue current insulin regimen.  Hemoglobin A1c of 8.  Diabetic coordinator following.  Needs to readjust the oral medications on discharge  Hypertension: Currently BP stable.  Takes amlodipine, losartan-hydrochlorothiazide at home  History of alcohol use: Continue thiamine, folic acid.  Not in withdrawal  History  of hyperlipidemia: Takes Lipitor at home.  History of rheumatoid arthritis: Takes methotrexate.  We recommend to follow-up with rheumatology as an  outpatient.  Hypokalemia: Being supplemented and monitored as needed.  Leukocytosis: Likely reactive.   Check CBC tomorrow  Obesity: BMI of 42.5  Deconditioning/debility: PT/OT evaluated him, no follow-up recommended.very deconditioned now.  Needs reevaluation when chest tube is out            DVT prophylaxis:Lovenox Code Status: Full Family Communication: None at the bedside.Has limited family members Patient status:Inpatient  Dispo: The patient is from: Home              Anticipated d/c is to: Home              Anticipated d/c date is: Needs to get rid of chest tube first  Consultants: PCCM, cardiothoracic surgery  Procedures: Thoracentesis  Antimicrobials:  Anti-infectives (From admission, onward)    Start     Dose/Rate Route Frequency Ordered Stop   07/25/21 0743  sodium chloride 0.9 % with cefTRIAXone (ROCEPHIN) ADS Med       Note to Pharmacy: Rejeana Brock L: cabinet override      07/25/21 0743 07/25/21 1959   07/21/21 1630  cefTRIAXone (ROCEPHIN) 1 g in sodium chloride 0.9 % 100 mL IVPB        1 g 200 mL/hr over 30 Minutes Intravenous Every 24 hours 07/21/21 1540     07/19/21 0330  cefTRIAXone (ROCEPHIN) 1 g in sodium chloride 0.9 % 100 mL IVPB        1 g 200 mL/hr over 30 Minutes Intravenous  Once 07/19/21 0323 07/19/21 0502   07/19/21 0330  azithromycin (ZITHROMAX) 500 mg in sodium chloride 0.9 % 250 mL IVPB        500 mg 250 mL/hr over 60 Minutes Intravenous  Once 07/19/21 0323 07/19/21 2979       Subjective:  Patient seen and examined at the bedside this morning.  Hemodynamically stable.  On 2 L.  Lying in bed.  Complains of some pain on the side of the left thigh.  He continues to complain of lack of sleep so Ambien ordered 5 mg at bedtime.  Looks very deconditioned, weak.  I have encouraged him to sit on the chair at least  Objective: Vitals:   07/26/21 1132 07/26/21 1900 07/26/21 2329 07/27/21 0325  BP: (!) 102/43 124/75 118/87 (!) 123/93   Pulse: 94 87 90 97  Resp:  17 20 20   Temp:  98.3 F (36.8 C) 98.2 F (36.8 C) 98.7 F (37.1 C)  TempSrc:  Oral Oral Oral  SpO2: 99% 100% 97% 93%  Weight:      Height:        Intake/Output Summary (Last 24 hours) at 07/27/2021 0735 Last data filed at 07/27/2021 0326 Gross per 24 hour  Intake 820 ml  Output 1390 ml  Net -570 ml   Filed Weights   07/18/21 2049 07/25/21 0732  Weight: (!) 142.4 kg (!) 142.4 kg    Examination:  General exam: Very deconditioned, chronically ill looking, weak HEENT: PERRL Respiratory system: Chest tube in the right side, no wheezing or crackles, good air entry both sides Cardiovascular system: S1 & S2 heard, RRR.  Gastrointestinal system: Abdomen is nondistended, soft and nontender. Central nervous system: Alert and oriented Extremities: Trace bilateral lower extremity edema, no clubbing ,no cyanosis Skin: No rashes, no ulcers,no icterus     Data Reviewed: I have personally reviewed following labs  and imaging studies  CBC: Recent Labs  Lab 07/21/21 0101 07/22/21 0136 07/23/21 0110 07/24/21 0109 07/25/21 0101 07/26/21 0012  WBC 13.7* 9.1 10.4 12.3* 12.5* 17.4*  NEUTROABS 10.8*  --  7.7 9.3* 9.3* 14.3*  HGB 8.9* 8.1* 8.6* 8.7* 8.5* 8.7*  HCT 28.1* 25.7* 27.8* 28.0* 27.3* 27.7*  MCV 75.1* 74.7* 75.5* 74.5* 74.8* 75.1*  PLT 204 192 237 295 322 159   Basic Metabolic Panel: Recent Labs  Lab 07/21/21 0101 07/22/21 0136 07/23/21 0110 07/24/21 0109 07/25/21 0101 07/26/21 0012 07/27/21 0114  NA 132*   < > 134* 137 137 135 138  K 3.9   < > 3.3* 3.5 3.5 4.3 4.9  CL 93*   < > 92* 94* 93* 93* 93*  CO2 31   < > 32 34* 34* 33* 28  GLUCOSE 160*   < > 220* 152* 98 299* 93  BUN 23*   < > 14 14 14  25* 21*  CREATININE 1.16   < > 1.00 0.98 0.97 1.29* 1.03  CALCIUM 8.4*   < > 8.0* 8.6* 8.6* 8.5* 9.0  MG  --   --   --  2.0  --   --   --   PHOS 3.7  --   --  3.7  --   --   --    < > = values in this interval not displayed.   GFR: Estimated  Creatinine Clearance: 117.2 mL/min (by C-G formula based on SCr of 1.03 mg/dL). Liver Function Tests: No results for input(s): AST, ALT, ALKPHOS, BILITOT, PROT, ALBUMIN in the last 168 hours.  No results for input(s): LIPASE, AMYLASE in the last 168 hours. No results for input(s): AMMONIA in the last 168 hours. Coagulation Profile: No results for input(s): INR, PROTIME in the last 168 hours. Cardiac Enzymes: No results for input(s): CKTOTAL, CKMB, CKMBINDEX, TROPONINI in the last 168 hours. BNP (last 3 results) No results for input(s): PROBNP in the last 8760 hours. HbA1C: No results for input(s): HGBA1C in the last 72 hours.  CBG: Recent Labs  Lab 07/26/21 1610 07/26/21 1921 07/26/21 2324 07/26/21 2354 07/27/21 0323  GLUCAP 190* 153* 65* 98 182*   Lipid Profile: No results for input(s): CHOL, HDL, LDLCALC, TRIG, CHOLHDL, LDLDIRECT in the last 72 hours. Thyroid Function Tests: No results for input(s): TSH, T4TOTAL, FREET4, T3FREE, THYROIDAB in the last 72 hours. Anemia Panel: No results for input(s): VITAMINB12, FOLATE, FERRITIN, TIBC, IRON, RETICCTPCT in the last 72 hours.  Sepsis Labs: No results for input(s): PROCALCITON, LATICACIDVEN in the last 168 hours.   Recent Results (from the past 240 hour(s))  Resp Panel by RT-PCR (Flu A&B, Covid) Nasopharyngeal Swab     Status: None   Collection Time: 07/18/21  8:48 PM   Specimen: Nasopharyngeal Swab; Nasopharyngeal(NP) swabs in vial transport medium  Result Value Ref Range Status   SARS Coronavirus 2 by RT PCR NEGATIVE NEGATIVE Final    Comment: (NOTE) SARS-CoV-2 target nucleic acids are NOT DETECTED.  The SARS-CoV-2 RNA is generally detectable in upper respiratory specimens during the acute phase of infection. The lowest concentration of SARS-CoV-2 viral copies this assay can detect is 138 copies/mL. A negative result does not preclude SARS-Cov-2 infection and should not be used as the sole basis for treatment or other  patient management decisions. A negative result may occur with  improper specimen collection/handling, submission of specimen other than nasopharyngeal swab, presence of viral mutation(s) within the areas targeted by this assay, and  inadequate number of viral copies(<138 copies/mL). A negative result must be combined with clinical observations, patient history, and epidemiological information. The expected result is Negative.  Fact Sheet for Patients:  EntrepreneurPulse.com.au  Fact Sheet for Healthcare Providers:  IncredibleEmployment.be  This test is no t yet approved or cleared by the [place] FDA and  has been authorized for detection and/or diagnosis of SARS-CoV-2 by FDA under an Emergency Use Authorization (EUA). This EUA will remain  in effect (meaning this test can be used) for the duration of the COVID-19 declaration under Section 564(b)(1) of the Act, 21 U.S.C.section 360bbb-3(b)(1), unless the authorization is terminated  or revoked sooner.       Influenza A by PCR NEGATIVE NEGATIVE Final   Influenza B by PCR NEGATIVE NEGATIVE Final    Comment: (NOTE) The Xpert Xpress SARS-CoV-2/FLU/RSV plus assay is intended as an aid in the diagnosis of influenza from Nasopharyngeal swab specimens and should not be used as a sole basis for treatment. Nasal washings and aspirates are unacceptable for Xpert Xpress SARS-CoV-2/FLU/RSV testing.  Fact Sheet for Patients: EntrepreneurPulse.com.au  Fact Sheet for Healthcare Providers: IncredibleEmployment.be  This test is not yet approved or cleared by the [place] FDA and has been authorized for detection and/or diagnosis of SARS-CoV-2 by FDA under an Emergency Use Authorization (EUA). This EUA will remain in effect (meaning this test can be used) for the duration of the COVID-19 declaration under Section 564(b)(1) of the Act, 21 U.S.C. section  360bbb-3(b)(1), unless the authorization is terminated or revoked.  Performed at [facility], [place] [address] [place], [place] [address]   Body fluid culture w Gram Stain     Status: None   Collection Time: 07/19/21 11:02 AM   Specimen: Pleural Fluid  Result Value Ref Range Status   Specimen Description PLEURAL FLUID  Final   Special Requests LEFT  Final   Gram Stain   Final    ABUNDANT WBC PRESENT,BOTH PMN AND MONONUCLEAR NO ORGANISMS SEEN    Culture   Final    NO GROWTH Performed at [facility], [place] [address], [place], [place] [address]    Report Status 07/22/2021 FINAL  Final  Body fluid culture w Gram Stain     Status: None   Collection Time: 07/19/21  3:25 PM   Specimen: Pleural Fluid  Result Value Ref Range Status   Specimen Description FLUID PLEURAL  Final   Special Requests NONE  Final   Gram Stain   Final    RARE WBC PRESENT,BOTH PMN AND MONONUCLEAR NO ORGANISMS SEEN    Culture   Final    NO GROWTH 3 DAYS Performed at [facility], [place] [address], [place], [place] [address]    Report Status 07/23/2021 FINAL  Final  MRSA Next Gen by PCR, Nasal     Status: None   Collection Time: 07/20/21  2:58 PM   Specimen: Nasal Mucosa; Nasal Swab  Result Value Ref Range Status   MRSA by PCR Next Gen NOT DETECTED NOT DETECTED Final    Comment: (NOTE) The GeneXpert MRSA Assay (FDA approved for NASAL specimens only), is one component of a comprehensive MRSA colonization surveillance program. It is not intended to diagnose MRSA infection nor to guide or monitor treatment for MRSA infections. Test performance is not FDA approved in patients less than 16 years old. Performed at [facility], [place] [address], [place], [place] [address]  Radiology Studies: DG Chest Port 1 View  Result Date: 07/26/2021 CLINICAL DATA:  Follow-up right chest tube. EXAM: PORTABLE CHEST 1 VIEW COMPARISON:  07/25/2021 FINDINGS: Stable cardiomediastinal contours  right chest tube is in place and stable from previous exam. Tiny pneumothorax is identified which measures approximately 4 mm over the apical portions of the right lung. The rib this does not appear significantly changed from previous exam. Opacities within the right midlung and right lower lung are unchanged. Left lung is clear. IMPRESSION: 1. Stable right chest tube with tiny right apical pneumothorax. 2. No change in aeration to the right lung. Electronically Signed   By: Kerby Moors M.D.   On: 07/26/2021 08:48   DG Chest Port 1 View  Result Date: 07/25/2021 CLINICAL DATA:  Postop imaging. EXAM: PORTABLE CHEST 1 VIEW COMPARISON:  07/24/2021. FINDINGS: Significant improvement in right lung aeration since the prior study. The near complete opacification of the right hemithorax has been replaced by patchy airspace opacities throughout and aerated right lung, with more confluent opacity at the right lung base. No pneumothorax. Right-sided chest tube has its tip near the right apex. IMPRESSION: 1. Significant improvement in right lung aeration consistent with evacuation of a right pleural effusion. 2. No pneumothorax.  Well-positioned right-sided chest tube. 3. Residual opacity noted throughout the right lung is consistent with atelectasis or infection. Electronically Signed   By: Lajean Manes M.D.   On: 07/25/2021 10:10        Scheduled Meds:  acetaminophen  1,000 mg Oral Q6H   Or   acetaminophen (TYLENOL) oral liquid 160 mg/5 mL  1,000 mg Oral Q6H   bisacodyl  10 mg Oral Daily   enoxaparin (LOVENOX) injection  60 mg Subcutaneous D40C   folic acid  1 mg Oral Daily   furosemide  40 mg Oral Daily   insulin aspart  0-9 Units Subcutaneous Q4H   insulin aspart  5 Units Subcutaneous TID WC   insulin glargine-yfgn  30 Units Subcutaneous Daily   mouth rinse  15 mL Mouth Rinse BID   multivitamin with minerals  1 tablet Oral Daily   potassium chloride  40 mEq Oral Daily   senna-docusate  1 tablet  Oral QHS   thiamine  100 mg Oral Daily   Or   thiamine  100 mg Intravenous Daily   Continuous Infusions:  cefTRIAXone (ROCEPHIN)  IV 1 g (07/26/21 1642)     LOS: 8 days    Time spent: 35 mins.More than 50% of that time was spent in counseling and/or coordination of care.      Shelly Coss, MD [facility] P1/17/2023, 7:35 AM

## 2021-07-28 ENCOUNTER — Ambulatory Visit: Payer: Managed Care, Other (non HMO) | Admitting: Nurse Practitioner

## 2021-07-28 ENCOUNTER — Inpatient Hospital Stay (HOSPITAL_COMMUNITY): Payer: Managed Care, Other (non HMO)

## 2021-07-28 DIAGNOSIS — E1159 Type 2 diabetes mellitus with other circulatory complications: Secondary | ICD-10-CM

## 2021-07-28 DIAGNOSIS — I152 Hypertension secondary to endocrine disorders: Secondary | ICD-10-CM

## 2021-07-28 DIAGNOSIS — J9601 Acute respiratory failure with hypoxia: Secondary | ICD-10-CM | POA: Diagnosis not present

## 2021-07-28 DIAGNOSIS — J90 Pleural effusion, not elsewhere classified: Secondary | ICD-10-CM | POA: Diagnosis not present

## 2021-07-28 LAB — GLUCOSE, CAPILLARY
Glucose-Capillary: 131 mg/dL — ABNORMAL HIGH (ref 70–99)
Glucose-Capillary: 168 mg/dL — ABNORMAL HIGH (ref 70–99)
Glucose-Capillary: 188 mg/dL — ABNORMAL HIGH (ref 70–99)
Glucose-Capillary: 193 mg/dL — ABNORMAL HIGH (ref 70–99)
Glucose-Capillary: 196 mg/dL — ABNORMAL HIGH (ref 70–99)
Glucose-Capillary: 198 mg/dL — ABNORMAL HIGH (ref 70–99)
Glucose-Capillary: 260 mg/dL — ABNORMAL HIGH (ref 70–99)

## 2021-07-28 LAB — BASIC METABOLIC PANEL
Anion gap: 8 (ref 5–15)
BUN: 17 mg/dL (ref 6–20)
CO2: 30 mmol/L (ref 22–32)
Calcium: 8.3 mg/dL — ABNORMAL LOW (ref 8.9–10.3)
Chloride: 96 mmol/L — ABNORMAL LOW (ref 98–111)
Creatinine, Ser: 0.99 mg/dL (ref 0.61–1.24)
GFR, Estimated: >60 mL/min (ref >60)
Glucose, Bld: 189 mg/dL — ABNORMAL HIGH (ref 70–99)
Potassium: 4.3 mmol/L (ref 3.5–5.1)
Sodium: 134 mmol/L — ABNORMAL LOW (ref 135–145)

## 2021-07-28 LAB — CBC
HCT: 25.3 % — ABNORMAL LOW (ref 39.0–52.0)
Hemoglobin: 7.5 g/dL — ABNORMAL LOW (ref 13.0–17.0)
MCH: 22.5 pg — ABNORMAL LOW (ref 26.0–34.0)
MCHC: 29.6 g/dL — ABNORMAL LOW (ref 30.0–36.0)
MCV: 75.7 fL — ABNORMAL LOW (ref 80.0–100.0)
Platelets: 342 10*3/uL (ref 150–400)
RBC: 3.34 MIL/uL — ABNORMAL LOW (ref 4.22–5.81)
RDW: 17.2 % — ABNORMAL HIGH (ref 11.5–15.5)
WBC: 9.6 10*3/uL (ref 4.0–10.5)
nRBC: 0 % (ref 0.0–0.2)

## 2021-07-28 MED ORDER — SODIUM CHLORIDE 0.9 % IV SOLN
250.0000 mg | Freq: Every day | INTRAVENOUS | Status: DC
  Administered 2021-07-28 – 2021-07-30 (×3): 250 mg via INTRAVENOUS
  Filled 2021-07-28 (×3): qty 20

## 2021-07-28 NOTE — Assessment & Plan Note (Addendum)
Secondary to bilateral pleural effusion plus minus pneumonia.  Patient initially required BiPAP and underwent bilateral thoracentesis on 1/9 followed by left-sided thoracentesis again on 1/10.  No evidence of organism or malignant cells x3.  When chest x-ray on 1/14 noted reaccumulation of pleural effusion on right patient underwent right-sided pleural biopsy and chest tube placement on 1/15.  Pathology is still pending, which patient will follow-up as outpatient for.  Patient now weaned off of oxygen.

## 2021-07-28 NOTE — Hospital Course (Addendum)
56 year old male with past medical history of tobacco abuse, diabetes mellitus, rheumatoid arthritis and recent COVID presented to the emergency room at [facility] on 1/8 with shortness of breath and hypoxia.  Chest CT noted bilateral pleural effusion with left upper abdominal calcified mass.  Patient transferred to [facility] and underwent bilateral thoracentesis.  Due to persistent reaccumulation of pleural effusion, patient underwent VATS with pleural biopsy and chest tube placement by cardiothoracic surgery on 1/15.  Patient continued to improve and chest tube was removed on 1/18.

## 2021-07-28 NOTE — Assessment & Plan Note (Signed)
Meets criteria BMI greater than 40.  Counseled on needing to eat healthier.

## 2021-07-28 NOTE — Assessment & Plan Note (Signed)
CT of chest abdomen pelvis notes indeterminate partially calcified mass involving upper pole of left kidney and CT of kidney notes 10.6 cm complex cystic lesion.  Recommended for patient to have abdominal MRI with and without contrast as follow-up in 6 months.

## 2021-07-28 NOTE — Assessment & Plan Note (Signed)
Blood pressure has been stable.  Continued on home medications.

## 2021-07-28 NOTE — Progress Notes (Signed)
Post chest tube site dressing  saturated with yellowish drainage . Site cleansed and  new dressing applied. Continue to monitor.

## 2021-07-28 NOTE — Assessment & Plan Note (Addendum)
A1c at 8.0.  Has been getting additional coverage with insulin while in the hospital.  Patient is noncompliant with his diet and admits to this.  He may need to have home medication adjustment, and will mention to his PCP

## 2021-07-28 NOTE — Progress Notes (Addendum)
[address]       [place] [address]             [phone]      3 Days Post-Op Procedure(s) (LRB): DRAINAGE OF PLEURAL EFFUSION (Right) PLEURAL BIOPSY (Right) VIDEO ASSISTED THORACOSCOPY (VATS)/EMPYEMA (Right) Subjective: Feels ok  Objective: Vital signs in last 24 hours: Temp:  [97.7 F (36.5 C)-98.8 F (37.1 C)] 98.8 F (37.1 C) (01/18 0312) Pulse Rate:  [80-100] 80 (01/18 0312) Cardiac Rhythm: Normal sinus rhythm (01/18 0312) Resp:  [18-19] 19 (01/18 0312) BP: (117-134)/(78-86) 124/80 (01/18 0312) SpO2:  [96 %-97 %] 96 % (01/18 0312)  Hemodynamic parameters for last 24 hours:    Intake/Output from previous day: 01/17 0701 - 01/18 0700 In: 1360 [P.O.:1360] Out: 5784 [Urine:1600; Chest Tube:90] Intake/Output this shift: No intake/output data recorded.  Neurologic: intact Heart: regular rate and rhythm Lungs: min dim in bases Abdomen: obese, benign Extremities: warm Wound: healing well  Lab Results: Recent Labs    07/26/21 0012 07/28/21 0134  WBC 17.4* 9.6  HGB 8.7* 7.5*  HCT 27.7* 25.3*  PLT 363 342   BMET:  Recent Labs    07/27/21 0114 07/28/21 0134  NA 138 134*  K 4.9 4.3  CL 93* 96*  CO2 28 30  GLUCOSE 93 189*  BUN 21* 17  CREATININE 1.03 0.99  CALCIUM 9.0 8.3*    PT/INR: No results for input(s): LABPROT, INR in the last 72 hours. ABG    Component Value Date/Time   PHART 7.451 (H) 07/18/2021 2118   HCO3 31.0 (H) 07/18/2021 2118   TCO2 32 07/18/2021 2118   O2SAT 99.0 07/18/2021 2118   CBG (last 3)  Recent Labs    07/28/21 0055 07/28/21 0354 07/28/21 0616  GLUCAP 198* 168* 131*    Meds Scheduled Meds:  acetaminophen  1,000 mg Oral Q6H   Or   acetaminophen (TYLENOL) oral liquid 160 mg/5 mL  1,000 mg Oral Q6H   bisacodyl  10 mg Oral Daily   enoxaparin (LOVENOX) injection  60 mg Subcutaneous O96E   folic acid  1 mg Oral Daily   furosemide  40 mg Oral Daily   insulin aspart  0-9 Units Subcutaneous  TID WC   insulin aspart  5 Units Subcutaneous TID WC   insulin glargine-yfgn  30 Units Subcutaneous Daily   mouth rinse  15 mL Mouth Rinse BID   multivitamin with minerals  1 tablet Oral Daily   senna-docusate  1 tablet Oral QHS   thiamine  100 mg Oral Daily   Or   thiamine  100 mg Intravenous Daily   zolpidem  5 mg Oral QHS   Continuous Infusions: PRN Meds:.acetaminophen **OR** acetaminophen, morphine injection, ondansetron (ZOFRAN) IV, ondansetron **OR** [DISCONTINUED] ondansetron (ZOFRAN) IV, oxyCODONE  Xrays DG CHEST PORT 1 VIEW  Result Date: 07/27/2021 CLINICAL DATA:  Chest tube present and respiratory distress EXAM: PORTABLE CHEST 1 VIEW COMPARISON:  Yesterday FINDINGS: Right chest tube is unchanged in position. Right apical pleural thickening is new, indicative of pleural fluid. The previously described apical pneumothorax is not identified. There may be a small amount of sub pulmonic pleural air medially. Subcutaneous emphysema about the right chest wall is mildly increased. Midline trachea. Mild cardiomegaly. Improved patchy right-sided airspace disease. Minimal left base atelectasis laterally. IMPRESSION: Right chest tube remaining in place. The apical pneumothorax has been replaced by small volume pleural fluid. There may be residual sub pulmonic small volume right-sided pleural air.  Cardiomegaly with improved right-sided airspace disease. Electronically Signed   By: Abigail Miyamoto M.D.   On: 07/27/2021 08:46    Assessment/Plan: S/P Procedure(s) (LRB): DRAINAGE OF PLEURAL EFFUSION (Right) PLEURAL BIOPSY (Right) VIDEO ASSISTED THORACOSCOPY (VATS)/EMPYEMA (Right) POD#3  1 afeb, VSS 2 sats good on 1 liter [place] 3 CT only 90 cc/no air leak- will remove tube today CXR with small apical pntx 4 path pending 5 medical management per primary       LOS: 9 days    John Giovanni PA-C Pager [phone] 07/28/2021    Chart reviewed, patient examined, agree with above. Chest tube  removed today. Followup CXR looks ok, small apical ptx. Continue IS, ambulation. Pathology of pleural biopsies pending.

## 2021-07-28 NOTE — Progress Notes (Signed)
Radiology made aware about the chest xray to be done after chest tube removal.

## 2021-07-28 NOTE — Progress Notes (Signed)
[facility] Progress Note  Patient: Derek Owen    XFG:[id number]  DOA: 07/18/2021    Date of Service: the patient was seen and examined on 07/28/2021  Brief hospital course: 56 year old male with past medical history of tobacco abuse, diabetes mellitus, rheumatoid arthritis and recent COVID presented to the emergency room at [facility] on 1/8 with shortness of breath and hypoxia.  Chest CT noted bilateral pleural effusion with left upper abdominal calcified mass.  Patient transferred to [facility] and underwent bilateral thoracentesis.  Due to persistent reaccumulation of pleural effusion, patient underwent pleural biopsy and chest tube placement by cardiothoracic surgery on 1/15.  Patient has continued to improve and chest tube to be removed today, 1/18.  Assessment and Plan: Cardiovascular and Mediastinum Hypertension associated with diabetes ([place]) Assessment & Plan Blood pressure has been stable.  Continued on home medications.  Respiratory * Acute respiratory failure with hypoxia ([facility]) Assessment & Plan Secondary to bilateral pleural effusion plus minus pneumonia.  Patient initially required BiPAP and underwent bilateral thoracentesis on 1/9 followed by left-sided thoracentesis again on 1/10.  No evidence of organism.  No evidence of malignant cells.  When chest x-ray on 1/14 noted reaccumulation of pleural effusion on right patient underwent right-sided pleural biopsy and chest tube placement on 1/15.  Pathology is still pending.  Patient now weaned off of oxygen.  Endocrine DM2 (diabetes mellitus, type 2) ([place]) Assessment & Plan CBGs have been running slightly higher last few days, ranging from 180s to 190s.  Will increase sliding scale coverage.  Other Morbid obesity ([place]) Assessment & Plan Meets criteria BMI greater than 40.  Counseled on needing to eat healthier.  Mass of abdomen Assessment & Plan CT of chest abdomen pelvis notes indeterminate partially  calcified mass involving upper pole of left kidney and CT of kidney notes 10.6 cm complex cystic lesion.  Recommended for patient to have abdominal MRI with and without contrast as follow-up in 6 months.    Body mass index is 42.58 kg/m.        Consultants: Cardiothoracic surgery  Procedures: Status post thoracentesis bilaterally 1/9 Status post left-sided thoracentesis 1/10 Status post right-sided chest tube placement 1/15-1/18  Antimicrobials: IV Zithromax 1/9 only IV Rocephin 1/9, 1/11-07/2013  Code Status: Full code   Subjective: Patient felt little lightheaded sitting up today, but breathing easier.  Denies any pain.  Objective: Vital signs were reviewed and unremarkable. Vitals:   07/28/21 0838 07/28/21 1200  BP: 136/79 117/82  Pulse:    Resp: 19 18  Temp: 98.5 F (36.9 C) 98 F (36.7 C)  SpO2: 91% 90%    Intake/Output Summary (Last 24 hours) at 07/28/2021 1428 Last data filed at 07/28/2021 9678 Gross per 24 hour  Intake 880 ml  Output 1640 ml  Net -760 ml   Filed Weights   07/18/21 2049 07/25/21 0732  Weight: (!) 142.4 kg (!) 142.4 kg   Body mass index is 42.58 kg/m.  Exam:  General: Alert and oriented x3, no acute distress HEENT: Normocephalic atraumatic, mucous membranes are moist, neck is thick, narrow airway Cardiovascular: Regular rate and rhythm, S1-S2 Respiratory: Decreased breath sounds throughout secondary to body habitus Abdomen: Soft, nontender, nondistended, positive bowel sounds Musculoskeletal: No clubbing or cyanosis, trace edema Skin: No skin breaks, tears or lesions Psychiatry: Appropriate, no evidence of psychoses Neurology: No focal deficits  Data Reviewed: Labs reviewed noteworthy for white count normalized, hemoglobin down to 7.5 and creatinine normalized  Disposition:  Status is: Inpatient  Remains inpatient appropriate because: Pathology from pleural biopsy pending.  Making sure patient tolerates chest tube  removal.        Family Communication: Patient declined for me to call anyone. DVT Prophylaxis: SCD's SCD's Start: 07/25/21 1109    Author: Annita Brod ,MD 07/28/2021 2:28 PM  To reach On-call, see care teams to locate the attending and reach out via [email or web address]. Between 7PM-7AM, please contact night-coverage If you still have difficulty reaching the attending provider, please page the [facility] (Director on Call) for [facility] on amion for assistance.

## 2021-07-29 DIAGNOSIS — J9601 Acute respiratory failure with hypoxia: Secondary | ICD-10-CM | POA: Diagnosis not present

## 2021-07-29 DIAGNOSIS — E1165 Type 2 diabetes mellitus with hyperglycemia: Secondary | ICD-10-CM

## 2021-07-29 DIAGNOSIS — R1902 Left upper quadrant abdominal swelling, mass and lump: Secondary | ICD-10-CM | POA: Diagnosis not present

## 2021-07-29 DIAGNOSIS — J90 Pleural effusion, not elsewhere classified: Secondary | ICD-10-CM | POA: Diagnosis not present

## 2021-07-29 DIAGNOSIS — D509 Iron deficiency anemia, unspecified: Secondary | ICD-10-CM | POA: Diagnosis present

## 2021-07-29 LAB — CBC
HCT: 24.4 % — ABNORMAL LOW (ref 39.0–52.0)
Hemoglobin: 7.6 g/dL — ABNORMAL LOW (ref 13.0–17.0)
MCH: 23.5 pg — ABNORMAL LOW (ref 26.0–34.0)
MCHC: 31.1 g/dL (ref 30.0–36.0)
MCV: 75.3 fL — ABNORMAL LOW (ref 80.0–100.0)
Platelets: 337 10*3/uL (ref 150–400)
RBC: 3.24 MIL/uL — ABNORMAL LOW (ref 4.22–5.81)
RDW: 17.5 % — ABNORMAL HIGH (ref 11.5–15.5)
WBC: 9.1 10*3/uL (ref 4.0–10.5)
nRBC: 0 % (ref 0.0–0.2)

## 2021-07-29 LAB — GLUCOSE, CAPILLARY
Glucose-Capillary: 144 mg/dL — ABNORMAL HIGH (ref 70–99)
Glucose-Capillary: 265 mg/dL — ABNORMAL HIGH (ref 70–99)
Glucose-Capillary: 190 mg/dL — ABNORMAL HIGH (ref 70–99)
Glucose-Capillary: 280 mg/dL — ABNORMAL HIGH (ref 70–99)

## 2021-07-29 LAB — BASIC METABOLIC PANEL
Anion gap: 8 (ref 5–15)
BUN: 17 mg/dL (ref 6–20)
CO2: 29 mmol/L (ref 22–32)
Calcium: 8.3 mg/dL — ABNORMAL LOW (ref 8.9–10.3)
Chloride: 99 mmol/L (ref 98–111)
Creatinine, Ser: 1.08 mg/dL (ref 0.61–1.24)
GFR, Estimated: >60 mL/min (ref >60)
Glucose, Bld: 138 mg/dL — ABNORMAL HIGH (ref 70–99)
Potassium: 3.9 mmol/L (ref 3.5–5.1)
Sodium: 136 mmol/L (ref 135–145)

## 2021-07-29 MED ORDER — INSULIN ASPART 100 UNIT/ML IJ SOLN
7.0000 [IU] | Freq: Three times a day (TID) | INTRAMUSCULAR | Status: DC
  Administered 2021-07-29 – 2021-07-30 (×3): 7 [IU] via SUBCUTANEOUS

## 2021-07-29 NOTE — Assessment & Plan Note (Signed)
Status post initial thoracentesis followed by VATS with pleural effusion drainage and biopsy.  Plan will be for patient to follow-up with cardiothoracic surgery as outpatient to review pathology.

## 2021-07-29 NOTE — TOC Transition Note (Signed)
Transition of Care [facility]) - CM/SW Discharge Note   Patient Details  Name: Derek Owen MRN: [id number] Date of Birth: Aug 13, 1965  [facility] CM/SW Contact:  Angelita Ingles, RN Phone Number:[phone]  07/29/2021, 3:04 PM   Clinical Narrative:    Patient discharging home with no TOC needs noted .   Final next level of care: Home/Self Care Barriers to Discharge: No Barriers Identified   Patient Goals and CMS Choice Patient states their goals for this hospitalization and ongoing recovery are:: Ready to go home   Choice offered to / list presented to : NA  Discharge Placement                       Discharge Plan and Services                DME Arranged: N/A DME Agency: NA       HH Arranged: NA HH Agency: NA        Social Determinants of Health (SDOH) Interventions     Readmission Risk Interventions No flowsheet data found.

## 2021-07-29 NOTE — Plan of Care (Signed)
  Problem: Education: Goal: Knowledge of General Education information will improve Description Including pain rating scale, medication(s)/side effects and non-pharmacologic comfort measures Outcome: Progressing   

## 2021-07-29 NOTE — Progress Notes (Signed)
Mobility Specialist Progress Note    07/29/21 1741  Mobility  Activity Ambulated with assistance in hallway  Level of Assistance Standby assist, set-up cues, supervision of patient - no hands on  Assistive Device Front wheel walker  Distance Ambulated (ft) 150 ft  Activity Response Tolerated fair  $Mobility charge 1 Mobility   Pre-Mobility: 110 HR, 96% SpO2 Post-Mobility: 108 HR  Pt received sitting EOB and agreeable. SOB with exertion. Ambulated on RA. Returned to sitting EOB.   [facility] Mobility Specialist  M.S. 2C and 6E: [phone] M.S. 4E: [phone]

## 2021-07-29 NOTE — Assessment & Plan Note (Signed)
Patient's baseline hemoglobin around 11.  Following admission, had some acute blood loss anemia as well especially following recurrent drainage and VATS.  By 1/18 down to 7.5, although has not dropped further.  Received IV iron.  Outpatient labs to be rechecked.

## 2021-07-29 NOTE — Discharge Summary (Addendum)
Physician Discharge Summary   Patient: Derek Owen MRN: [id number] DOB: January 02, 1966  Admit date:     07/18/2021  Discharge date: 07/30/2021  Discharge Physician: Alma Friendly   PCP: Renee Rival, FNP   Recommendations at discharge:  Patient will follow-up with Dr. Pamalee Leyden and cardiothoracic surgery to review pathology of pleural biopsy Patient will follow up with Oncology as scheduled  Patient will follow-up with his PCP for consideration of adjustments in diabetic medications.   Hospital Course   56 year old male with past medical history of tobacco abuse, diabetes mellitus, rheumatoid arthritis and recent COVID presented to the emergency room at [facility] on 1/8 with shortness of breath and hypoxia.  Chest CT noted bilateral pleural effusion with left upper abdominal calcified mass.  Patient transferred to [facility] and underwent bilateral thoracentesis.  Due to persistent reaccumulation of pleural effusion, patient underwent VATS with pleural biopsy and chest tube placement by cardiothoracic surgery on 1/15.  Patient continued to improve and chest tube was removed on 1/18. Of note, patient pathology report form pleural biopsy showed possible malignant renal cell CA. Discussed with patient and oncology, pt will have a close follow up on 08/09/21 at [facility].    Discharge Diagnoses Cardiovascular and Mediastinum Hypertension associated with diabetes ([place]) Assessment & Plan Blood pressure has been stable.  Continued on home medications.  Respiratory Recurrent pleural effusion Assessment & Plan Status post initial thoracentesis followed by VATS with pleural effusion drainage and biopsy.  Plan will be for patient to follow-up with cardiothoracic surgery as outpatient to review pathology.  * Acute respiratory failure with hypoxia (HCC) Assessment & Plan Secondary to bilateral pleural effusion plus minus pneumonia.  Patient initially required  BiPAP and underwent bilateral thoracentesis on 1/9 followed by left-sided thoracentesis again on 1/10.  No evidence of organism or malignant cells x3.  When chest x-ray on 1/14 noted reaccumulation of pleural effusion on right patient underwent right-sided pleural biopsy and chest tube placement on 1/15.  Pathology is still pending, which patient will follow-up as outpatient for.  Patient now weaned off of oxygen.  Endocrine Uncontrolled type 2 diabetes mellitus with hyperglycemia (HCC) Assessment & Plan A1c at 8.0.  Has been getting additional coverage with insulin while in the hospital.  Patient is noncompliant with his diet and admits to this.  He may need to have home medication adjustment, and will mention to his PCP  Other Iron deficiency anemia Assessment & Plan Patient's baseline hemoglobin around 11.  Following admission, had some acute blood loss anemia as well especially following recurrent drainage and VATS.  By 1/18 down to 7.5, although has not dropped further.  Received IV iron.  Outpatient labs to be rechecked.  Morbid obesity ([place]) Assessment & Plan Meets criteria BMI greater than 40.  Counseled on needing to eat healthier.  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. No further work up needed while inpatient.   Consultants: Cardiothoracic surgery  Procedures performed: Status post thoracentesis bilaterally 1/9 Status post left-sided thoracentesis 1/10 Status post right-sided chest tube placement 1/15-1/18   Disposition: Home Diet recommendation: Cardiac and Carb modified diet  DISCHARGE  MEDICATION: Allergies as of  07/29/2021   No Known Allergies      Medication List     TAKE these medications    albuterol 108 (90 Base) MCG/ACT inhaler Commonly known as: VENTOLIN HFA Inhale 1-2 puffs into the lungs every 4 (four) hours as needed for wheezing or shortness of breath.   amLODipine 10 MG tablet Commonly known as: NORVASC Take 1 tablet (10 mg total) by mouth daily.   aspirin 81 MG chewable tablet Chew 81 mg by mouth daily.   atorvastatin 40 MG tablet Commonly known as: LIPITOR Take 1 tablet (40 mg total) by mouth daily.   DAYQUIL PO Take 30 mLs by mouth 2 (two) times daily as needed (cold/cough).   folic acid 1 MG tablet Commonly known as: FOLVITE Take 1 tablet (1 mg total) by mouth daily.   glipiZIDE 10 MG tablet Commonly known as: GLUCOTROL Take 1 tablet (10 mg total) by mouth daily before breakfast.   guaiFENesin 100 MG/5ML liquid Commonly known as: ROBITUSSIN Take 5-10 mLs (100-200 mg total) by mouth every 4 (four) hours as needed for cough or to loosen phlegm.   ibuprofen 600 MG tablet Commonly known as: ADVIL Take 1 tablet (600 mg total) by mouth every 6 (six) hours as needed for mild pain, fever or headache.   losartan-hydrochlorothiazide 100-25 MG tablet Commonly known as: HYZAAR Take 1 tablet by mouth daily.   methotrexate 2.5 MG tablet Commonly known as: RHEUMATREX Take 10 tablets (25 mg total) by mouth once a week. What changed: additional instructions   Rybelsus 14 MG Tabs Generic drug: Semaglutide Take 14 mg by mouth daily.         Discharge Exam: Filed Weights   07/18/21 2049 07/25/21 0732  Weight: (!) 142.4 kg (!) 142.4 kg   CV:  [id number] Lungs: Decreased breath sounds throughout secondary to body habitus  Condition at discharge: good  The results of significant diagnostics from this hospitalization (including imaging, microbiology, ancillary and laboratory) are listed below for reference.   Imaging Studies: DG Chest 2 View  Result Date:  07/28/2021 CLINICAL DATA:  Chest tube removed 2 hours ago EXAM: CHEST - 2 VIEW COMPARISON:  07/28/2021 7:28 a.m. FINDINGS: Interval removal of right chest tube. Persistent right apical pneumothorax, which appears unchanged. Persistent small right pleural effusion layering in the right lower lung, which obscures previously noted right lower lung airspace disease. No left pleural effusion. No significant pulmonary opacity in the left lung. Mild cardiomegaly. Unchanged mediastinal contours. Air in the soft tissues, likely related to the prior chest tube. No acute osseous abnormality. IMPRESSION: Interval removal of right chest tube with persistent right apical pneumothorax and small right pleural effusion. Electronically Signed   By: Merilyn Baba M.D.   On: 07/28/2021 11:10   CT Angio Chest PE W and/or Wo Contrast  Result Date: 07/19/2021 CLINICAL DATA:  Pulmonary embolism suspected, high probability. EXAM: CT ANGIOGRAPHY CHEST WITH CONTRAST TECHNIQUE: Multidetector CT imaging of the chest was performed using the standard protocol during bolus administration of intravenous contrast. Multiplanar CT image reconstructions and MIPs were obtained to evaluate the vascular anatomy. CONTRAST:  155mL OMNIPAQUE IOHEXOL 350 MG/ML SOLN COMPARISON:  None. FINDINGS: Cardiovascular: The heart is normal in size and there is no pericardial effusion. Scattered coronary artery calcifications are noted. There is atherosclerotic calcification of the aorta without evidence of aneurysm. Pulmonary trunk is normal in caliber. No large central pulmonary artery filling defect is identified. There is suboptimal opacification and compressive atelectasis  evaluation of the segmental and subsegmental arteries. Mediastinum/Nodes: No mediastinal, hilar, or axillary lymphadenopathy. A subcentimeter hypodensity is present in the left lobe of the thyroid gland which is too small to further characterize. The trachea and esophagus are within normal  limits. Lungs/Pleura: There are large bilateral pleural effusion with compressive atelectasis bilaterally. No pneumothorax. Upper Abdomen: A rim calcified mass is present in the left upper quadrant inferior to the spleen, only partially visualized on exam. Musculoskeletal: Degenerative changes in the thoracic spine. No acute osseous abnormality. Review of the MIP images confirms the above findings. IMPRESSION: 1. No large central pulmonary artery embolism. There is suboptimal evaluation of the segmental subsegmental arteries. 2. Large bilateral pleural effusions with compressive atelectasis. 3. Coronary artery calcifications. 4. Aortic atherosclerosis. 5. Rim calcified mass in the left upper quadrant of unknown etiology. CT abdomen and pelvis is recommended for further evaluation. Electronically Signed   By: Brett Fairy M.D.   On: 07/19/2021 04:32   NM Pulmonary Perfusion  Result Date: 07/12/2021 CLINICAL DATA:  Shortness of breath for 1 week, COVID positive EXAM: NUCLEAR MEDICINE PERFUSION LUNG SCAN TECHNIQUE: Perfusion images were obtained in multiple projections after intravenous injection of radiopharmaceutical. Ventilation scans intentionally deferred if perfusion scan and chest x-ray adequate for interpretation during COVID 19 epidemic. RADIOPHARMACEUTICALS:  4.4 mCi Tc-48m MAA IV COMPARISON:  Chest radiograph dated July 12, 2021 FINDINGS: No peripheral filling defects to suggest pulmonary embolism. IMPRESSION: Normal or low probability of pulmonary embolism. Electronically Signed   By: Keane Police D.O.   On: 07/12/2021 10:13   CT CHEST ABDOMEN PELVIS W CONTRAST  Result Date: 07/21/2021 CLINICAL DATA:  Anemia. Partially imaged rim calcified mass in the left upper quadrant of the abdomen on prior chest CT. EXAM: CT CHEST, ABDOMEN, AND PELVIS WITH CONTRAST TECHNIQUE: Multidetector CT imaging of the chest, abdomen and pelvis was performed following the standard protocol during bolus administration of  intravenous contrast. RADIATION DOSE REDUCTION: This exam was performed according to the departmental dose-optimization program which includes automated exposure control, adjustment of the mA and/or kV according to patient size and/or use of iterative reconstruction technique. CONTRAST:  110mL OMNIPAQUE IOHEXOL 300 MG/ML  SOLN COMPARISON:  Chest CTA 07/19/2021. No other relevant comparison studies. FINDINGS: CT CHEST FINDINGS Cardiovascular: No acute vascular findings are seen. There is suboptimal opacification of the pulmonary arteries, but no evidence of acute pulmonary embolism. Mild atherosclerosis of the aorta, great vessels and coronary arteries. The heart size is normal. There is no pericardial effusion. Mediastinum/Nodes: There are no enlarged mediastinal, hilar or axillary lymph nodes. The thyroid gland, trachea and esophagus demonstrate no significant findings. Lungs/Pleura: Significant improvement in the previously demonstrated large bilateral pleural effusions. Mild residual pleural effusions are present bilaterally. There is persistent dense consolidation in both lower lobes, left greater than right. In addition, there is mild dependent airspace disease within the right middle lobe. The upper lobes are clear. No suspicious pulmonary nodules. Mild centrilobular emphysema. Musculoskeletal/Chest wall: No chest wall mass or suspicious osseous findings. Mild thoracic spondylosis. CT ABDOMEN AND PELVIS FINDINGS Hepatobiliary: Subjective mild hepatic steatosis without focal abnormality or abnormal enhancement. No evidence of gallstones, gallbladder wall thickening or biliary dilatation. Pancreas: Unremarkable. No pancreatic ductal dilatation or surrounding inflammatory changes. Spleen: Normal in size without focal abnormality. Adrenals/Urinary Tract: The right adrenal gland appears normal. There is splaying of the limbs of the left adrenal gland by the partially calcified left renal mass, further described  below. No adrenal mass identified. There is a  large peripherally calcified mass involving the upper pole of the left kidney, measuring up to 11.1 x 10.7 cm transverse on image 67/3. This measures up to 10.7 cm craniocaudal on coronal image 115/5. In addition to peripheral calcifications, there are speckled internal calcifications within this lesion which is incompletely characterized. No other renal masses are identified. The right kidney appears normal. There is no hydronephrosis. The bladder appears unremarkable. Stomach/Bowel: Enteric contrast was administered and has passed into the mid colon. The stomach appears unremarkable for its degree of distension. No evidence of bowel wall thickening, distention or surrounding inflammatory change. The appendix appears normal. Vascular/Lymphatic: There are no enlarged abdominal or pelvic lymph nodes. Mild aortic and branch vessel atherosclerosis without acute vascular findings or aneurysm. The portal, superior mesenteric and splenic veins are patent. The renal veins and IVC appear normal. Reproductive: Mild enlargement of the prostate gland. The seminal vesicles appear unremarkable. Other: Small umbilical hernia containing only fat.  No ascites. Musculoskeletal: No acute or significant osseous findings. Multilevel spondylosis contributing to biforaminal narrowing at L4-5 and L5-S1. IMPRESSION: 1. Interval significant improvement in previously demonstrated bilateral pleural effusions. There is persistent lower lobe consolidation bilaterally suspicious for pneumonia. Continued radiographic follow-up recommended. 2. Indeterminate partially calcified mass involving the upper pole of the left kidney. This is not clearly cystic by single-phase CT and demonstrates internal calcifications. Further evaluation recommended to exclude neoplasm. Optimally, this would be evaluated with dedicated renal MRI (without and with contrast) when the patient is clinically stable and able to  follow directions and hold their breath (preferably as an outpatient). If unable to tolerate MRI, dedicated renal CT (pre and post-contrast imaging) could be performed. 3. Coronary and Aortic Atherosclerosis (ICD10-I70.0). Electronically Signed   By: Richardean Sale M.D.   On: 07/21/2021 14:42   DG Chest Port 1 View  Result Date: 07/28/2021 CLINICAL DATA:  Pleural effusion. EXAM: PORTABLE CHEST 1 VIEW COMPARISON:  Seventeen 2023 FINDINGS: Right chest tube remains in place. Possible small right apical pneumothorax has developed in the interval. Small right effusion unchanged. Right lower lobe airspace disease unchanged. Mild left lower lobe airspace disease unchanged. IMPRESSION: Right chest tube remains in place. Question small right apical pneumothorax. Small right effusion and right lower lobe airspace disease unchanged. Electronically Signed   By: Franchot Gallo M.D.   On: 07/28/2021 10:05   DG CHEST PORT 1 VIEW  Result Date: 07/27/2021 CLINICAL DATA:  Chest tube present and respiratory distress EXAM: PORTABLE CHEST 1 VIEW COMPARISON:  Yesterday FINDINGS: Right chest tube is unchanged in position. Right apical pleural thickening is new, indicative of pleural fluid. The previously described apical pneumothorax is not identified. There may be a small amount of sub pulmonic pleural air medially. Subcutaneous emphysema about the right chest wall is mildly increased. Midline trachea. Mild cardiomegaly. Improved patchy right-sided airspace disease. Minimal left base atelectasis laterally. IMPRESSION: Right chest tube remaining in place. The apical pneumothorax has been replaced by small volume pleural fluid. There may be residual sub pulmonic small volume right-sided pleural air. Cardiomegaly with improved right-sided airspace disease. Electronically Signed   By: Abigail Miyamoto M.D.   On: 07/27/2021 08:46   DG Chest Port 1 View  Result Date: 07/26/2021 CLINICAL DATA:  Follow-up right chest tube. EXAM:  PORTABLE CHEST 1 VIEW COMPARISON:  07/25/2021 FINDINGS: Stable cardiomediastinal contours right chest tube is in place and stable from previous exam. Tiny pneumothorax is identified which measures approximately 4 mm over the apical portions of the right lung.  The rib this does not appear significantly changed from previous exam. Opacities within the right midlung and right lower lung are unchanged. Left lung is clear. IMPRESSION: 1. Stable right chest tube with tiny right apical pneumothorax. 2. No change in aeration to the right lung. Electronically Signed   By: Kerby Moors M.D.   On: 07/26/2021 08:48   DG Chest Port 1 View  Result Date: 07/25/2021 CLINICAL DATA:  Postop imaging. EXAM: PORTABLE CHEST 1 VIEW COMPARISON:  07/24/2021. FINDINGS: Significant improvement in right lung aeration since the prior study. The near complete opacification of the right hemithorax has been replaced by patchy airspace opacities throughout and aerated right lung, with more confluent opacity at the right lung base. No pneumothorax. Right-sided chest tube has its tip near the right apex. IMPRESSION: 1. Significant improvement in right lung aeration consistent with evacuation of a right pleural effusion. 2. No pneumothorax.  Well-positioned right-sided chest tube. 3. Residual opacity noted throughout the right lung is consistent with atelectasis or infection. Electronically Signed   By: Lajean Manes M.D.   On: 07/25/2021 10:10   DG Chest Port 1 View  Result Date: 07/24/2021 CLINICAL DATA:  Follow-up large pleural effusion EXAM: PORTABLE CHEST 1 VIEW COMPARISON:  Chest radiograph from one day prior. FINDINGS: Stable cardiomediastinal silhouette with top-normal heart size. No pneumothorax. Large right pleural effusion nearly completely opacifying the right hemithorax, increased. No left pleural effusion. Clear left lung. IMPRESSION: Large right pleural effusion, nearly completely opacifying the right hemithorax, increased.  Electronically Signed   By: Ilona Sorrel M.D.   On: 07/24/2021 08:10   DG CHEST PORT 1 VIEW  Result Date: 07/23/2021 CLINICAL DATA:  Pleural effusion. Shortness of breath. Respiratory distress. EXAM: PORTABLE CHEST 1 VIEW COMPARISON:  07/22/2021; 07/21/2021; 07/20/2021; CT of the chest, abdomen and pelvis 07/21/2021 FINDINGS: Grossly unchanged enlarged cardiac silhouette and mediastinal contours. Interval increase in now large loculated right-sided pleural effusion with associated progressive compressive atelectasis/collapse without definitive mediastinal shift. Unchanged left basilar heterogeneous consolidative opacities. No definite left-sided pleural effusion. No evidence of edema. No pneumothorax. No acute osseous abnormalities. Degenerative change of the bilateral acromioclavicular joints. Stigmata of DISH within thoracic spine. IMPRESSION: 1. Progression of now large loculated right-sided effusion with associated progressive atelectasis/collapse. Further evaluation with repeat contrast-enhanced chest CT could be performed as indicated. 2. Unchanged left basilar heterogeneous consolidative opacities again worrisome for infection or aspiration. These results will be called to the ordering clinician or representative by the Radiologist Assistant, and communication documented in the PACS or [employer]. Electronically Signed   By: Sandi Mariscal M.D.   On: 07/23/2021 07:55   DG CHEST PORT 1 VIEW  Result Date: 07/22/2021 CLINICAL DATA:  Evaluate lungs EXAM: PORTABLE CHEST 1 VIEW COMPARISON:  Chest x-ray dated July 21, 2021 FINDINGS: Cardiac and mediastinal contours are unchanged. Moderate loculated right pleural effusion, increased in size when compared with prior exam. Bibasilar opacities. No evidence of pneumothorax. IMPRESSION: 1. Moderate loculated right pleural effusion, increased in size when compared to prior exam. 2. Bibasilar opacities, concerning for infection or aspiration. Electronically  Signed   By: Yetta Glassman M.D.   On: 07/22/2021 10:51   DG CHEST PORT 1 VIEW  Result Date: 07/21/2021 CLINICAL DATA:  Status post thoracentesis. EXAM: PORTABLE CHEST 1 VIEW COMPARISON:  Chest radiograph 07/21/2021 at 6:40 a.m. FINDINGS: The cardiomediastinal silhouette is unchanged with normal heart size. There is a small right pleural effusion, decreased following interval thoracentesis. A small left-sided pleural effusion remains.  There are patchy bibasilar airspace opacities, unchanged on the left and with improved aeration of the right lung base following thoracentesis. No pneumothorax is identified. IMPRESSION: 1. Decreased right pleural effusion following thoracentesis. No pneumothorax. 2. Persistent small bilateral. Pleural effusions and bibasilar atelectasis or infiltrates Electronically Signed   By: Logan Bores M.D.   On: 07/21/2021 10:27   DG Chest Port 1 View  Result Date: 07/21/2021 CLINICAL DATA:  Shortness of breath, respiratory distress, pleural effusions. EXAM: PORTABLE CHEST 1 VIEW COMPARISON:  Chest radiograph dated July 20, 2020. FINDINGS: The heart is enlarged. Hazy opacity in the right lower lung likely representing large pleural effusion with associated atelectasis. Small left pleural effusion. Osseous structures are unremarkable. IMPRESSION: 1. Cardiomegaly. 2. Large right pleural effusion with associated atelectasis. Underlying airspace disease can not be excluded. Small left pleural effusion. Electronically Signed   By: Keane Police D.O.   On: 07/21/2021 08:59   DG CHEST PORT 1 VIEW  Result Date: 07/20/2021 CLINICAL DATA:  56 year old male with history of shortness of breath. Status post thoracentesis. EXAM: PORTABLE CHEST 1 VIEW COMPARISON:  Chest x-ray 07/20/2021. FINDINGS: Persistent moderate to large right pleural effusion appears slightly decreased compared to the prior study. No definite pneumothorax. Opacity in the right lung base, favored to predominantly reflect  areas of passive subsegmental atelectasis although underlying airspace consolidation is not excluded. Left lung is clear. No evidence of pulmonary edema. Heart size is normal. Upper mediastinal contours are within normal limits. IMPRESSION: 1. Slightly decreased size of moderate to large right pleural effusion following thoracentesis. No postprocedural pneumothorax. Persistent passive atelectasis in the right lung base. Electronically Signed   By: Vinnie Langton M.D.   On: 07/20/2021 12:20   DG Chest Port 1 View  Result Date: 07/20/2021 CLINICAL DATA:  Respiratory distress and pleural effusions. EXAM: PORTABLE CHEST 1 VIEW COMPARISON:  Portable chest today at 15:14, chest CT today at 02:43. FINDINGS: 4:15 a.m., 07/20/2021. The cardiac size is normal. The central vessels are borderline prominent without flow cephalization and no edema is seen. Moderate/large right and moderate left pleural effusions continue to be seen with overlying opacities consistent with atelectasis or consolidation, greater on the right. The left mid and both upper lung fields remain clear. No new or worsening lung opacity is seen. The pleural effusions seem unchanged. IMPRESSION: Right-greater-than-left pleural effusions with overlying atelectasis or consolidation. Overall aeration seems unchanged. Electronically Signed   By: Telford Nab M.D.   On: 07/20/2021 05:08   DG Chest Portable 1 View  Result Date: 07/19/2021 CLINICAL DATA:  thoracentesis EXAM: PORTABLE CHEST 1 VIEW COMPARISON:  Chest x-ray 07/19/2021. FINDINGS: The heart and mediastinal contours are unchanged. Bibasilar vague airspace opacity. No pulmonary edema. Bilateral trace to small bilateral effusions. No pneumothorax. No acute osseous abnormality. IMPRESSION: Bilateral trace to small bilateral effusions. Associated bibasilar vague airspace opacities likely representing atelectasis. Superimposed infection/inflammation not excluded. Electronically Signed   By: Iven Finn M.D.   On: 07/19/2021 15:28   DG Chest Portable 1 View  Result Date: 07/19/2021 CLINICAL DATA:  Status post thoracentesis.  Shortness of breath. EXAM: PORTABLE CHEST 1 VIEW COMPARISON:  Chest radiograph 07/18/2021 and chest CTA 07/19/2021 FINDINGS: The cardiomediastinal silhouette is unchanged with normal heart size. There are persistent right larger than left pleural effusions with associated lower lobe atelectasis. Lung aeration is overall similar to yesterday's chest radiograph. No pneumothorax is identified. IMPRESSION: No pneumothorax. Bilateral pleural effusions and lower lobe atelectasis. Electronically Signed   By: Zenia Resides  Jeralyn Ruths M.D.   On: 07/19/2021 10:36   DG Chest Port 1 View  Result Date: 07/18/2021 CLINICAL DATA:  Shortness of breath EXAM: PORTABLE CHEST 1 VIEW COMPARISON:  07/12/2021 FINDINGS: Bilateral lower lobe airspace opacities and layering effusions. This is stable on the left, worsened on the right since prior study. Heart is borderline in size. No acute bony abnormality. IMPRESSION: Bilateral effusions with lower lobe atelectasis or infiltrates, worsening on the right since prior study. Electronically Signed   By: Rolm Baptise M.D.   On: 07/18/2021 22:14   DG Chest Portable 1 View  Result Date: 07/12/2021 CLINICAL DATA:  Exertional shortness of breath and cough. Tested positive for COVID 8 days ago. EXAM: PORTABLE CHEST 1 VIEW COMPARISON:  There are prior chest films from 2018 and 2014 but they could not be retrieved from PACS. FINDINGS: There is mild cardiomegaly. There is slight central vascular prominence. No pulmonary edema is seen. There is a moderate left pleural effusion with adjacent consolidation or atelectasis in the left lower lung field. Findings may suggest a left lower lobe pneumonia with parapneumonic pleural effusion. The remaining lungs are clear. The right sulci are sharp. Thoracic spondylosis. IMPRESSION: Left pleural effusion with adjacent consolidation or  atelectasis in the left lower lung field. Clinical correlation and radiographic follow-up recommended. Mild cardiomegaly with slightly prominent central vessels. Electronically Signed   By: Telford Nab M.D.   On: 07/12/2021 02:53   ECHOCARDIOGRAM COMPLETE  Result Date: 07/19/2021    ECHOCARDIOGRAM REPORT   Patient Name:   PRASHANT GLOSSER Date of Exam: 07/19/2021 Medical Rec #:  [id number]       Height:       72.0 in Accession #:    [id number]      Weight:       314.0 lb Date of Birth:  Jul 29, 1965       BSA:          2.581 m Patient Age:    78 years        BP:           126/89 mmHg Patient Gender: M               HR:           116 bpm. Exam Location:  Inpatient Procedure: 2D Echo, Cardiac Doppler, Color Doppler and Intracardiac            Opacification Agent Indications:    CHF  History:        Patient has no prior history of Echocardiogram examinations.                 Risk Factors:Hypertension and Diabetes.  Sonographer:    Jyl Heinz Referring Phys: [place]  1. Left ventricular ejection fraction, by estimation, is 55 to 60%. The left ventricle has normal function. The left ventricle has no regional wall motion abnormalities. There is moderate asymmetric left ventricular hypertrophy of the basal-septal segment. Left ventricular diastolic parameters are indeterminate.  2. Right ventricular systolic function is normal. The right ventricular size is normal. Tricuspid regurgitation signal is inadequate for assessing PA pressure.  3. The mitral valve is normal in structure. No evidence of mitral valve regurgitation.  4. The aortic valve was not well visualized. Aortic valve regurgitation is not visualized. No aortic stenosis is present. FINDINGS  Left Ventricle: Left ventricular ejection fraction, by estimation, is 55 to 60%. The left ventricle has normal function. The left ventricle has no  regional wall motion abnormalities. The left ventricular internal cavity size was small. There is  moderate  asymmetric left ventricular hypertrophy of the basal-septal segment. Left ventricular diastolic parameters are indeterminate. Right Ventricle: The right ventricular size is normal. Right vetricular wall thickness was not well visualized. Right ventricular systolic function is normal. Tricuspid regurgitation signal is inadequate for assessing PA pressure. Left Atrium: Left atrial size was not well visualized. Right Atrium: Right atrial size was not well visualized. Pericardium: There is no evidence of pericardial effusion. Mitral Valve: The mitral valve is normal in structure. No evidence of mitral valve regurgitation. Tricuspid Valve: The tricuspid valve is normal in structure. Tricuspid valve regurgitation is trivial. Aortic Valve: The aortic valve was not well visualized. Aortic valve regurgitation is not visualized. No aortic stenosis is present. Aortic valve peak gradient measures 5.3 mmHg. Pulmonic Valve: The pulmonic valve was not well visualized. Pulmonic valve regurgitation is not visualized. Aorta: The aortic root and ascending aorta are structurally normal, with no evidence of dilitation. IAS/Shunts: The interatrial septum was not well visualized.  LEFT VENTRICLE PLAX 2D LVIDd:         3.70 cm     Diastology LVIDs:         2.60 cm     LV e' medial:    7.62 cm/s LV PW:         1.20 cm     LV E/e' medial:  6.2 LV IVS:        1.70 cm     LV e' lateral:   7.72 cm/s LVOT diam:     2.20 cm     LV E/e' lateral: 6.2 LV SV:         34 LV SV Index:   13 LVOT Area:     3.80 cm  LV Volumes (MOD) LV vol d, MOD A2C: 89.9 ml LV vol d, MOD A4C: 67.0 ml LV vol s, MOD A2C: 40.5 ml LV vol s, MOD A4C: 28.5 ml LV SV MOD A2C:     49.4 ml LV SV MOD A4C:     67.0 ml LV SV MOD BP:      47.7 ml RIGHT VENTRICLE RV Basal diam:  3.70 cm RV Mid diam:    3.20 cm RV S prime:     16.40 cm/s TAPSE (M-mode): 1.7 cm LEFT ATRIUM         Index       RIGHT ATRIUM           Index LA diam:    2.30 cm 0.89 cm/m  RA Area:     10.80 cm                                  RA Volume:   22.60 ml  8.76 ml/m  AORTIC VALVE AV Area (Vmax): 2.61 cm AV Vmax:        115.00 cm/s AV Peak Grad:   5.3 mmHg LVOT Vmax:      79.00 cm/s LVOT Vmean:     52.100 cm/s LVOT VTI:       0.089 m  AORTA Ao Root diam: 3.30 cm Ao Asc diam:  3.30 cm MITRAL VALVE MV Area (PHT): 5.23 cm    SHUNTS MV Decel Time: 145 msec    Systemic VTI:  0.09 m MV E velocity: 47.60 cm/s  Systemic Diam: 2.20 cm MV A velocity: 72.40 cm/s MV E/A ratio:  0.66 Oswaldo Milian MD Electronically signed by Oswaldo Milian MD Signature Date/Time: 07/19/2021/4:04:25 PM    Final    CT RENAL ABD W/WO  Result Date: 07/24/2021 CLINICAL DATA:  Indeterminate left renal lesion on recent CT. EXAM: CT ABDOMEN WITHOUT AND WITH CONTRAST TECHNIQUE: Multidetector CT imaging of the abdomen was performed following the standard protocol before and following the bolus administration of intravenous contrast. RADIATION DOSE REDUCTION: This exam was performed according to the departmental dose-optimization program which includes automated exposure control, adjustment of the mA and/or kV according to patient size and/or use of iterative reconstruction technique. CONTRAST:  91mL OMNIPAQUE IOHEXOL 350 MG/ML SOLN COMPARISON:  07/21/2021 FINDINGS: Lower chest: Increased size of large right pleural effusion with compressive atelectasis right lower lung. Decreased airspace disease in posterior left lower lobe. Persistent tiny left pleural effusion. Hepatobiliary: No hepatic masses identified. Gallbladder is unremarkable. No evidence of biliary ductal dilatation. Pancreas:  No mass or inflammatory changes. Spleen:  Within normal limits in size and appearance. Adrenals/Urinary Tract: Normal appearance of adrenal glands and right kidney. A large low-attenuation lesion is seen in the upper pole of the left kidney, which measures 10.7 x 10.6 cm. This shows thin peripheral rim calcifications, as well as scattered internal  calcifications likely within internal septations. This lesion shows no evidence of internal contrast enhancement. This meets criteria for a probably benign Bosniak category 30F lesion. No evidence of hydronephrosis. Stomach/Bowel: Diverticulosis is seen involving the visualized portion of the transverse colon, without signs of diverticulitis in this region. Vascular/Lymphatic: No pathologically enlarged lymph nodes identified. No acute vascular findings. Aortic atherosclerotic calcification noted. Other:  None. Musculoskeletal:  No suspicious bone lesions identified. IMPRESSION: 10.6 cm complex cystic lesion in upper pole of left kidney, consistent with an indeterminate but probably benign Bosniak category 30F lesion. Recommend continued imaging follow-up in 6 months, preferably with abdomen MRI without and with contrast. Increased size of large right pleural effusion and compressive atelectasis. Decreased airspace disease in posterior left lower lobe. Persistent tiny left pleural effusion. Aortic Atherosclerosis (ICD10-I70.0). Electronically Signed   By: Marlaine Hind M.D.   On: 07/24/2021 15:25   VAS [place] LOWER EXTREMITY VENOUS (DVT)  Result Date: 07/23/2021  Lower Venous DVT Study Patient Name:  EARVIN BLAZIER  Date of Exam:   07/23/2021 Medical Rec #: [id number]        Accession #:    [id number] Date of Birth: 1965-07-25        Patient Gender: M Patient Age:   56 years Exam Location:  [facility] Procedure:      VAS [place] LOWER EXTREMITY VENOUS (DVT) Referring Phys: Noemi Chapel --------------------------------------------------------------------------------  Indications: Edema.  Comparison Study: No previous exams Performing Technologist: Jody Hill RVT, RDMS  Examination Guidelines: A complete evaluation includes B-mode imaging, spectral Doppler, color Doppler, and power Doppler as needed of all accessible portions of each vessel. Bilateral testing is considered an integral part of a complete examination.  Limited examinations for reoccurring indications may be performed as noted. The reflux portion of the exam is performed with the patient in reverse Trendelenburg.  +---------+---------------+---------+-----------+----------+-------------------+  RIGHT     Compressibility Phasicity Spontaneity Properties Thrombus Aging       +---------+---------------+---------+-----------+----------+-------------------+  CFV       Full            Yes       Yes                                         +---------+---------------+---------+-----------+----------+-------------------+  SFJ       Full                                                                  +---------+---------------+---------+-----------+----------+-------------------+  FV Prox   Full            Yes       Yes                                         +---------+---------------+---------+-----------+----------+-------------------+  FV Mid    Full            Yes       Yes                    Not well visualized  +---------+---------------+---------+-----------+----------+-------------------+  FV Distal Full            Yes       Yes                    Not well visualized  +---------+---------------+---------+-----------+----------+-------------------+  PFV       Full                                                                  +---------+---------------+---------+-----------+----------+-------------------+  POP       Full            Yes       Yes                                         +---------+---------------+---------+-----------+----------+-------------------+  PTV       Full                                                                  +---------+---------------+---------+-----------+----------+-------------------+  PERO      Full                                                                  +---------+---------------+---------+-----------+----------+-------------------+   +---------+---------------+---------+-----------+----------+-------------------+   LEFT      Compressibility Phasicity Spontaneity Properties Thrombus Aging       +---------+---------------+---------+-----------+----------+-------------------+  CFV       Full            Yes       Yes                                         +---------+---------------+---------+-----------+----------+-------------------+  SFJ       Full                                                                  +---------+---------------+---------+-----------+----------+-------------------+  FV Prox   Full            Yes       Yes                                         +---------+---------------+---------+-----------+----------+-------------------+  FV Mid    Full            Yes       Yes                    Not well visualized  +---------+---------------+---------+-----------+----------+-------------------+  FV Distal Full            Yes       Yes                    Not well visualized  +---------+---------------+---------+-----------+----------+-------------------+  PFV       Full                                                                  +---------+---------------+---------+-----------+----------+-------------------+  POP       Full            Yes       Yes                                         +---------+---------------+---------+-----------+----------+-------------------+  PTV       Full                                                                  +---------+---------------+---------+-----------+----------+-------------------+  PERO      Full                                                                  +---------+---------------+---------+-----------+----------+-------------------+     Summary: BILATERAL: - No evidence of deep vein thrombosis seen in the lower extremities, bilaterally. - No evidence of superficial venous thrombosis in the lower extremities, bilaterally. - RIGHT: - No cystic structure found in the popliteal fossa.  LEFT: - A cystic structure is found in the popliteal fossa.  *See table(s)  above for measurements and observations. Electronically signed by Jamelle Haring on 07/23/2021  at 2:54:28 PM.    Final     Microbiology: Results for orders placed or performed during the hospital encounter of 07/18/21  Resp Panel by RT-PCR (Flu A&B, Covid) Nasopharyngeal Swab     Status: None   Collection Time: 07/18/21  8:48 PM   Specimen: Nasopharyngeal Swab; Nasopharyngeal(NP) swabs in vial transport medium  Result Value Ref Range Status   SARS Coronavirus 2 by RT PCR NEGATIVE NEGATIVE Final    Comment: (NOTE) SARS-CoV-2 target nucleic acids are NOT DETECTED.  The SARS-CoV-2 RNA is generally detectable in upper respiratory specimens during the acute phase of infection. The lowest concentration of SARS-CoV-2 viral copies this assay can detect is 138 copies/mL. A negative result does not preclude SARS-Cov-2 infection and should not be used as the sole basis for treatment or other patient management decisions. A negative result may occur with  improper specimen collection/handling, submission of specimen other than nasopharyngeal swab, presence of viral mutation(s) within the areas targeted by this assay, and inadequate number of viral copies(<138 copies/mL). A negative result must be combined with clinical observations, patient history, and epidemiological information. The expected result is Negative.  Fact Sheet for Patients:  EntrepreneurPulse.com.au  Fact Sheet for Healthcare Providers:  IncredibleEmployment.be  This test is no t yet approved or cleared by the [place] FDA and  has been authorized for detection and/or diagnosis of SARS-CoV-2 by FDA under an Emergency Use Authorization (EUA). This EUA will remain  in effect (meaning this test can be used) for the duration of the COVID-19 declaration under Section 564(b)(1) of the Act, 21 U.S.C.section 360bbb-3(b)(1), unless the authorization is terminated  or revoked sooner.        Influenza A by PCR NEGATIVE NEGATIVE Final   Influenza B by PCR NEGATIVE NEGATIVE Final    Comment: (NOTE) The Xpert Xpress SARS-CoV-2/FLU/RSV plus assay is intended as an aid in the diagnosis of influenza from Nasopharyngeal swab specimens and should not be used as a sole basis for treatment. Nasal washings and aspirates are unacceptable for Xpert Xpress SARS-CoV-2/FLU/RSV testing.  Fact Sheet for Patients: EntrepreneurPulse.com.au  Fact Sheet for Healthcare Providers: IncredibleEmployment.be  This test is not yet approved or cleared by the [place] FDA and has been authorized for detection and/or diagnosis of SARS-CoV-2 by FDA under an Emergency Use Authorization (EUA). This EUA will remain in effect (meaning this test can be used) for the duration of the COVID-19 declaration under Section 564(b)(1) of the Act, 21 U.S.C. section 360bbb-3(b)(1), unless the authorization is terminated or revoked.  Performed at [facility], [place] [address], [place], [place] [address]   Body fluid culture w Gram Stain     Status: None   Collection Time: 07/19/21 11:02 AM   Specimen: Pleural Fluid  Result Value Ref Range Status   Specimen Description PLEURAL FLUID  Final   Special Requests LEFT  Final   Gram Stain   Final    ABUNDANT WBC PRESENT,BOTH PMN AND MONONUCLEAR NO ORGANISMS SEEN    Culture   Final    NO GROWTH Performed at [facility], [place] [address], [place], [place] [address]    Report Status 07/22/2021 FINAL  Final  Body fluid culture w Gram Stain     Status: None   Collection Time: 07/19/21  3:25 PM   Specimen: Pleural Fluid  Result Value Ref Range Status   Specimen Description FLUID PLEURAL  Final   Special Requests NONE  Final   Gram Stain  Final    RARE WBC PRESENT,BOTH PMN AND MONONUCLEAR NO ORGANISMS SEEN    Culture   Final    NO GROWTH 3 DAYS Performed at [facility], [place] [address], [place], [place]  [address]    Report Status 07/23/2021 FINAL  Final  MRSA Next Gen by PCR, Nasal     Status: None   Collection Time: 07/20/21  2:58 PM   Specimen: Nasal Mucosa; Nasal Swab  Result Value Ref Range Status   MRSA by PCR Next Gen NOT DETECTED NOT DETECTED Final    Comment: (NOTE) The GeneXpert MRSA Assay (FDA approved for NASAL specimens only), is one component of a comprehensive MRSA colonization surveillance program. It is not intended to diagnose MRSA infection nor to guide or monitor treatment for MRSA infections. Test performance is not FDA approved in patients less than 61 years old. Performed at [facility], [place] [address] [place], [place] [address]     Labs: CBC: Recent Labs  Lab 07/23/21 0110 07/24/21 0109 07/25/21 0101 07/26/21 0012 07/28/21 0134 07/29/21 0133  WBC 10.4 12.3* 12.5* 17.4* 9.6 9.1  NEUTROABS 7.7 9.3* 9.3* 14.3*  --   --   HGB 8.6* 8.7* 8.5* 8.7* 7.5* 7.6*  HCT 27.8* 28.0* 27.3* 27.7* 25.3* 24.4*  MCV 75.5* 74.5* 74.8* 75.1* 75.7* 75.3*  PLT 237 295 322 363 342 557   Basic Metabolic Panel: Recent Labs  Lab 07/24/21 0109 07/25/21 0101 07/26/21 0012 07/27/21 0114 07/28/21 0134 07/29/21 0133  NA 137 137 135 138 134* 136  K 3.5 3.5 4.3 4.9 4.3 3.9  CL 94* 93* 93* 93* 96* 99  CO2 34* 34* 33* 28 30 29   GLUCOSE 152* 98 299* 93 189* 138*  BUN 14 14 25* 21* 17 17  CREATININE 0.98 0.97 1.29* 1.03 0.99 1.08  CALCIUM 8.6* 8.6* 8.5* 9.0 8.3* 8.3*  MG 2.0  --   --   --   --   --   PHOS 3.7  --   --   --   --   --    Liver Function Tests: No results for input(s): AST, ALT, ALKPHOS, BILITOT, PROT, ALBUMIN in the last 168 hours. CBG: Recent Labs  Lab 07/28/21 1202 07/28/21 1632 07/28/21 2109 07/29/21 0606 07/29/21 1118  GLUCAP 188* 193* 260* 144* 265*    Discharge time spent: less than 30 minutes.  Signed: Alma Friendly, MD [facility] 07/29/2021

## 2021-07-29 NOTE — Progress Notes (Addendum)
[place]       [employer] [address]             [phone]      4 Days Post-Op Procedure(s) (LRB): DRAINAGE OF PLEURAL EFFUSION (Right) PLEURAL BIOPSY (Right) VIDEO ASSISTED THORACOSCOPY (VATS)/EMPYEMA (Right) Subjective: Feeling ok overall  Objective: Vital signs in last 24 hours: Temp:  [98 F (36.7 C)-99.4 F (37.4 C)] 99.4 F (37.4 C) (01/19 0353) Pulse Rate:  [88-95] 95 (01/19 0353) Cardiac Rhythm: Normal sinus rhythm (01/19 0353) Resp:  [18-21] 21 (01/19 0353) BP: (112-136)/(66-99) 114/77 (01/19 0353) SpO2:  [90 %-95 %] 95 % (01/19 0353)  Hemodynamic parameters for last 24 hours:    Intake/Output from previous day: 01/18 0701 - 01/19 0700 In: 740 [P.O.:450; IV Piggyback:290] Out: 700 [Urine:700] Intake/Output this shift: No intake/output data recorded.  General appearance: alert, cooperative, and no distress Heart: regular rate and rhythm Lungs: clear to auscultation bilaterally Abdomen: obese, benign Incis: healing well   Lab Results: Recent Labs    07/28/21 0134 07/29/21 0133  WBC 9.6 9.1  HGB 7.5* 7.6*  HCT 25.3* 24.4*  PLT 342 337   BMET:  Recent Labs    07/28/21 0134 07/29/21 0133  NA 134* 136  K 4.3 3.9  CL 96* 99  CO2 30 29  GLUCOSE 189* 138*  BUN 17 17  CREATININE 0.99 1.08  CALCIUM 8.3* 8.3*    PT/INR: No results for input(s): LABPROT, INR in the last 72 hours. ABG    Component Value Date/Time   PHART 7.451 (H) 07/18/2021 2118   HCO3 31.0 (H) 07/18/2021 2118   TCO2 32 07/18/2021 2118   O2SAT 99.0 07/18/2021 2118   CBG (last 3)  Recent Labs    07/28/21 1632 07/28/21 2109 07/29/21 0606  GLUCAP 193* 260* 144*    Meds Scheduled Meds:  acetaminophen  1,000 mg Oral Q6H   Or   acetaminophen (TYLENOL) oral liquid 160 mg/5 mL  1,000 mg Oral Q6H   bisacodyl  10 mg Oral Daily   enoxaparin (LOVENOX) injection  60 mg Subcutaneous I43P   folic acid  1 mg Oral Daily   furosemide  40 mg Oral Daily    insulin aspart  0-9 Units Subcutaneous TID WC   insulin aspart  5 Units Subcutaneous TID WC   insulin glargine-yfgn  30 Units Subcutaneous Daily   mouth rinse  15 mL Mouth Rinse BID   multivitamin with minerals  1 tablet Oral Daily   senna-docusate  1 tablet Oral QHS   thiamine  100 mg Oral Daily   Or   thiamine  100 mg Intravenous Daily   zolpidem  5 mg Oral QHS   Continuous Infusions:  ferric gluconate (FERRLECIT) IVPB Stopped (07/28/21 1926)   PRN Meds:.acetaminophen **OR** acetaminophen, morphine injection, ondansetron (ZOFRAN) IV, ondansetron **OR** [DISCONTINUED] ondansetron (ZOFRAN) IV, oxyCODONE  Xrays DG Chest 2 View  Result Date: 07/28/2021 CLINICAL DATA:  Chest tube removed 2 hours ago EXAM: CHEST - 2 VIEW COMPARISON:  07/28/2021 7:28 a.m. FINDINGS: Interval removal of right chest tube. Persistent right apical pneumothorax, which appears unchanged. Persistent small right pleural effusion layering in the right lower lung, which obscures previously noted right lower lung airspace disease. No left pleural effusion. No significant pulmonary opacity in the left lung. Mild cardiomegaly. Unchanged mediastinal contours. Air in the soft tissues, likely related to the prior chest tube. No acute osseous abnormality. IMPRESSION: Interval removal of right chest tube with  persistent right apical pneumothorax and small right pleural effusion. Electronically Signed   By: Merilyn Baba M.D.   On: 07/28/2021 11:10   DG Chest Port 1 View  Result Date: 07/28/2021 CLINICAL DATA:  Pleural effusion. EXAM: PORTABLE CHEST 1 VIEW COMPARISON:  Seventeen 2023 FINDINGS: Right chest tube remains in place. Possible small right apical pneumothorax has developed in the interval. Small right effusion unchanged. Right lower lobe airspace disease unchanged. Mild left lower lobe airspace disease unchanged. IMPRESSION: Right chest tube remains in place. Question small right apical pneumothorax. Small right effusion and  right lower lobe airspace disease unchanged. Electronically Signed   By: Franchot Gallo M.D.   On: 07/28/2021 10:05    Recent Results (from the past 240 hour(s))  Body fluid culture w Gram Stain     Status: None   Collection Time: 07/19/21 11:02 AM   Specimen: Pleural Fluid  Result Value Ref Range Status   Specimen Description PLEURAL FLUID  Final   Special Requests LEFT  Final   Gram Stain   Final    ABUNDANT WBC PRESENT,BOTH PMN AND MONONUCLEAR NO ORGANISMS SEEN    Culture   Final    NO GROWTH Performed at [facility], [address], [place], [place] [address]    Report Status 07/22/2021 FINAL  Final  Body fluid culture w Gram Stain     Status: None   Collection Time: 07/19/21  3:25 PM   Specimen: Pleural Fluid  Result Value Ref Range Status   Specimen Description FLUID PLEURAL  Final   Special Requests NONE  Final   Gram Stain   Final    RARE WBC PRESENT,BOTH PMN AND MONONUCLEAR NO ORGANISMS SEEN    Culture   Final    NO GROWTH 3 DAYS Performed at [facility], [place] [address] [place], [place] [address]    Report Status 07/23/2021 FINAL  Final  MRSA Next Gen by PCR, Nasal     Status: None   Collection Time: 07/20/21  2:58 PM   Specimen: Nasal Mucosa; Nasal Swab  Result Value Ref Range Status   MRSA by PCR Next Gen NOT DETECTED NOT DETECTED Final    Comment: (NOTE) The GeneXpert MRSA Assay (FDA approved for NASAL specimens only), is one component of a comprehensive MRSA colonization surveillance program. It is not intended to diagnose MRSA infection nor to guide or monitor treatment for MRSA infections. Test performance is not FDA approved in patients less than 100 years old. Performed at [facility], [place] [address] [place], [place] [address]       Assessment/Plan: S/P Procedure(s) (LRB): DRAINAGE OF PLEURAL EFFUSION (Right) PLEURAL BIOPSY (Right) VIDEO ASSISTED THORACOSCOPY (VATS)/EMPYEMA (Right)  1 Tmax 99.4, VSS 2 sats are ok on RA 3  CXR is stable after CT removal yesterday 4 no leukocytosis 5 anemia conts slow trend lower, defer to medicine 6 operative cx neg 7 OR Path pending for pleural BX- can discuss results in office 8 home per primary, stable from surgical perspective, F/U arrangements with [place] in chart     LOS: 10 days    Derek Giovanni PA-C Pager [phone] 07/29/2021    Chart reviewed, patient examined, agree with above. I think he can go home when medicine feels he is stable.

## 2021-07-29 NOTE — Progress Notes (Signed)
Inpatient Diabetes Program Recommendations  AACE/ADA: New Consensus Statement on Inpatient Glycemic Control (2015)  Target Ranges:  Prepandial:   less than 140 mg/dL      Peak postprandial:   less than 180 mg/dL (1-2 hours)      Critically ill patients:  140 - 180 mg/dL   Lab Results  Component Value Date   GLUCAP 265 (H) 07/29/2021   HGBA1C 8.0 (H) 07/19/2021    Review of Glycemic Control  Latest Reference Range & Units 07/28/21 21:09 07/29/21 06:06 07/29/21 11:18  Glucose-Capillary 70 - 99 mg/dL 260 (H) 144 (H) 265 (H)  (H): Data is abnormally high Diabetes history: Type 2 DM Outpatient Diabetes medications: Glipizide 10 mg QD, Rybelsus 14 mg QD Current orders for Inpatient glycemic control: Novolog 0-9 units TID, Semglee 30 units QD, Novolog 5 units TID   Inpatient Diabetes Program Recommendations:    Consider increasing Novolog 7 units TID (assuming patient consuming >50% of meals).   Thanks, Bronson Curb, MSN, RNC-OB Diabetes Coordinator [phone] (8a-5p)

## 2021-07-29 NOTE — Plan of Care (Signed)

## 2021-07-30 LAB — GLUCOSE, CAPILLARY
Glucose-Capillary: 129 mg/dL — ABNORMAL HIGH (ref 70–99)
Glucose-Capillary: 181 mg/dL — ABNORMAL HIGH (ref 70–99)

## 2021-07-30 LAB — SURGICAL PATHOLOGY

## 2021-07-30 LAB — CYTOLOGY - NON PAP

## 2021-07-30 NOTE — Plan of Care (Signed)

## 2021-07-30 NOTE — Progress Notes (Signed)
Mobility Specialist Progress Note:   07/30/21 1303  Mobility  Activity Ambulated with assistance in hallway  Level of Assistance Standby assist, set-up cues, supervision of patient - no hands on  Assistive Device Front wheel walker  Distance Ambulated (ft) 260 ft  Activity Response Tolerated well  $Mobility charge 1 Mobility   Pt received EOB willing to participate in mobility. No complaints of pain and asymptomatic. Pt left EOB with call bell in reach and all needs met.   [facility] Public librarian Phone [phone] Secondary Phone [phone]

## 2021-08-03 ENCOUNTER — Ambulatory Visit (INDEPENDENT_AMBULATORY_CARE_PROVIDER_SITE_OTHER): Payer: Self-pay

## 2021-08-03 ENCOUNTER — Other Ambulatory Visit: Payer: Self-pay

## 2021-08-03 DIAGNOSIS — Z4802 Encounter for removal of sutures: Secondary | ICD-10-CM

## 2021-08-03 NOTE — Progress Notes (Signed)
Patient arrived for nurse visit to remove one suture post- procedure VATS/ drainage of pleural effusion with Dr. Cyndia Bent 07/25/21.  Suture removed with no signs/ symptoms of infection noted.  Patient tolerated procedure well.  Patient instructed to keep the incision sites clean and dry.  Patient acknowledged instructions given.

## 2021-08-06 ENCOUNTER — Encounter (HOSPITAL_COMMUNITY): Payer: Self-pay

## 2021-08-06 NOTE — Progress Notes (Signed)
Attempted to reach patient for introductory phone call. I was unable to reach the patient. Detailed VM left and encouraged the patient to call with questions or concerns.

## 2021-08-09 ENCOUNTER — Other Ambulatory Visit: Payer: Self-pay

## 2021-08-09 ENCOUNTER — Other Ambulatory Visit (HOSPITAL_COMMUNITY): Payer: Managed Care, Other (non HMO)

## 2021-08-09 ENCOUNTER — Inpatient Hospital Stay (HOSPITAL_COMMUNITY): Payer: Managed Care, Other (non HMO) | Attending: Hematology | Admitting: Hematology

## 2021-08-09 ENCOUNTER — Ambulatory Visit (HOSPITAL_COMMUNITY)
Admission: RE | Admit: 2021-08-09 | Discharge: 2021-08-09 | Disposition: A | Discharge status: Home / Self Care | Payer: Managed Care, Other (non HMO) | Source: Ambulatory Visit | Attending: Hematology | Admitting: Hematology

## 2021-08-09 DIAGNOSIS — C642 Malignant neoplasm of left kidney, except renal pelvis: Secondary | ICD-10-CM

## 2021-08-09 DIAGNOSIS — D509 Iron deficiency anemia, unspecified: Secondary | ICD-10-CM | POA: Diagnosis not present

## 2021-08-09 DIAGNOSIS — C782 Secondary malignant neoplasm of pleura: Secondary | ICD-10-CM | POA: Insufficient documentation

## 2021-08-09 DIAGNOSIS — F17210 Nicotine dependence, cigarettes, uncomplicated: Secondary | ICD-10-CM | POA: Insufficient documentation

## 2021-08-09 DIAGNOSIS — M069 Rheumatoid arthritis, unspecified: Secondary | ICD-10-CM | POA: Insufficient documentation

## 2021-08-09 DIAGNOSIS — J90 Pleural effusion, not elsewhere classified: Secondary | ICD-10-CM | POA: Insufficient documentation

## 2021-08-09 NOTE — Patient Instructions (Addendum)
[place] at [facility] Discharge Instructions  You were seen and examined today by Dr. Delton Coombes. Dr. Delton Coombes is a medical oncologist, meaning that he specializes in the management of cancer diagnoses. Dr. Delton Coombes discussed your past medical history, family history of cancers, and the events that led to you being here today.  You were referred to Dr. Delton Coombes due to your newly diagnoses Renal Cell Carcinoma. This was proven on your recent biopsy taken from the lining of your lung. This means that it is a Stage IV cancer, meaning that it cannot be cured but the spread of the cancer can be controlled.  Dr. Delton Coombes has recommended labs today as well as a bone scan. The bone scan will tell Dr. Delton Coombes if there is any spread of the cancer to your bones. Dr. Delton Coombes also recommends a chest X-Ray to see how much of the fluid has reaccumulated in your lung.  Follow-up with Dr. Delton Coombes following your bone scan.   Thank you for choosing [place] at [facility] to provide your oncology and hematology care.  To afford each patient quality time with our provider, please arrive at least 15 minutes before your scheduled appointment time.   If you have a lab appointment with the  [facility] please come in thru the Main Entrance and check in at the main information desk.  You need to re-schedule your appointment should you arrive 10 or more minutes late.  We strive to give you quality time with our providers, and arriving late affects you and other patients whose appointments are after yours.  Also, if you no show three or more times for appointments you may be dismissed from the clinic at the providers discretion.     Again, thank you for choosing [facility].  Our hope is that these requests will decrease the amount of time that you wait before being seen by our physicians.        _____________________________________________________________  Should you have questions after your visit to [facility], please contact our office at [phone] and follow the prompts.  Our office hours are 8:00 a.m. and 4:30 p.m. Monday - Friday.  Please note that voicemails left after 4:00 p.m. may not be returned until the following business day.  We are closed weekends and major holidays.  You do have access to a nurse 24-7, just call the main number to the clinic [phone] and do not press any options, hold on the line and a nurse will answer the phone.    For prescription refill requests, have your pharmacy contact our office and allow 72 hours.    Due to Covid, you will need to wear a mask upon entering the hospital. If you do not have a mask, a mask will be given to you at the Main Entrance upon arrival. For doctor visits, patients may have 1 support person age 62 or older with them. For treatment visits, patients can not have anyone with them due to social distancing guidelines and our immunocompromised population.

## 2021-08-09 NOTE — Progress Notes (Signed)
Derek Owen [address], [place] [address]   CLINIC:  Medical Oncology/Hematology  CONSULT NOTE  Patient Care Team: Renee Rival, FNP as PCP - General (Nurse Practitioner) Brien Mates, RN as Oncology Nurse Navigator (Medical Oncology) Derek Jack, MD as Medical Oncologist (Medical Oncology)  CHIEF COMPLAINTS/PURPOSE OF CONSULTATION:  Metastatic kidney cancer to the pleura  HISTORY OF PRESENTING ILLNESS:  [name] 56 y.o. male is here because of evaluation of LUQ abdominal pain, at the request of inpatient.  Today he reports feeling well. He reports SOB when walking. He reports a fall in December 2022 following which he began to experience back pain and developed SOB for which he presented to the ED on 07/18/2021. His SOB has worsened since being discharged from the hospital on 07/29/2021. His appetite is good, and he denies weight loss. He denies ankle swellings. He denies history of cardiac issues. He reports he started methotrexate 3 weeks ago for his history of RA which causes soreness and swelling in his wrists and left knee. He lives at home on his own. He currently smoked 4-5 cigarettes a day and has smoked for 15 years. He currently works as a Administrator. He was adopted and is unaware of his biological family medical history.   MEDICAL HISTORY:  Past Medical History:  Diagnosis Date   Alcohol use disorder, mild, abuse    Cigarette smoker    Diabetes mellitus without complication ([facility])    Hyperlipidemia    Hypertension    Polycythemia 04/13/2020   Stroke ([facility])    x3    SURGICAL HISTORY: Past Surgical History:  Procedure Laterality Date   PLEURAL BIOPSY Right 07/25/2021   Procedure: PLEURAL BIOPSY;  Surgeon: Gaye Pollack, MD;  Location: [facility];  Service: Thoracic;  Laterality: Right;   PLEURAL EFFUSION DRAINAGE Right 07/25/2021   Procedure: DRAINAGE OF PLEURAL EFFUSION;  Surgeon: Gaye Pollack, MD;  Location:  [facility];  Service: Thoracic;  Laterality: Right;   VIDEO ASSISTED THORACOSCOPY (VATS)/EMPYEMA Right 07/25/2021   Procedure: VIDEO ASSISTED THORACOSCOPY (VATS)/EMPYEMA;  Surgeon: Gaye Pollack, MD;  Location: [facility];  Service: Thoracic;  Laterality: Right;    SOCIAL HISTORY: Social History   Socioeconomic History   Marital status: Single    Spouse name: Not on file   Number of children: Not on file   Years of education: Not on file   Highest education level: Not on file  Occupational History   Not on file  Tobacco Use   Smoking status: Every Day    Packs/day: 1.00    Types: Cigarettes   Smokeless tobacco: Never  Vaping Use   Vaping Use: Never used  Substance and Sexual Activity   Alcohol use: Yes    Comment: Occasional   Drug use: Not Currently   Sexual activity: Not Currently    Birth control/protection: Other-see comments  Other Topics Concern   Not on file  Social History Narrative   Not on file   Social Determinants of Health   Financial Resource Strain: Not on file  Food Insecurity: Not on file  Transportation Needs: Not on file  Physical Activity: Not on file  Stress: Not on file  Social Connections: Not on file  Intimate Partner Violence: Not on file    FAMILY HISTORY: Family History  Adopted: Yes    ALLERGIES:  has No Known Allergies.  MEDICATIONS:  Current Outpatient Medications  Medication Sig Dispense Refill  amLODipine (NORVASC) 10 MG tablet Take 1 tablet (10 mg total) by mouth daily. 90 tablet 1   aspirin 81 MG chewable tablet Chew 81 mg by mouth daily.     atorvastatin (LIPITOR) 40 MG tablet Take 1 tablet (40 mg total) by mouth daily. 90 tablet 1   folic acid (FOLVITE) 1 MG tablet Take 1 tablet (1 mg total) by mouth daily. 90 tablet 0   glipiZIDE (GLUCOTROL) 10 MG tablet Take 1 tablet (10 mg total) by mouth daily before breakfast. 90 tablet 1   guaiFENesin (ROBITUSSIN) 100 MG/5ML liquid Take 5-10 mLs (100-200 mg total) by mouth every 4 (four)  hours as needed for cough or to loosen phlegm. 60 mL 0   ibuprofen (ADVIL) 600 MG tablet Take 1 tablet (600 mg total) by mouth every 6 (six) hours as needed for mild pain, fever or headache. 30 tablet 0   losartan-hydrochlorothiazide (HYZAAR) 100-25 MG tablet Take 1 tablet by mouth daily. 90 tablet 1   methotrexate (RHEUMATREX) 2.5 MG tablet Take 10 tablets (25 mg total) by mouth once a week. 50 tablet 0   Pseudoephedrine-APAP-DM (DAYQUIL PO) Take 30 mLs by mouth 2 (two) times daily as needed (cold/cough).     Semaglutide (RYBELSUS) 14 MG TABS Take 14 mg by mouth daily. 90 tablet 3   albuterol (VENTOLIN HFA) 108 (90 Base) MCG/ACT inhaler Inhale 1-2 puffs into the lungs every 4 (four) hours as needed for wheezing or shortness of breath. (Patient not taking: Reported on 08/09/2021) 1 each 0   No current facility-administered medications for this visit.    REVIEW OF SYSTEMS:   Review of Systems  Constitutional:  Positive for fatigue. Negative for appetite change.  Respiratory:  Positive for shortness of breath.   All other systems reviewed and are negative.   PHYSICAL EXAMINATION: ECOG PERFORMANCE STATUS: 0 - Asymptomatic  Vitals:   08/09/21 0827  BP: 139/90  Pulse: (!) 20  Resp: 20  Temp: 98.5 F (36.9 C)  SpO2: 96%   Filed Weights   08/09/21 0827  Weight: (!) 303 lb 9.6 oz (137.7 kg)   Physical Exam Vitals reviewed.  Constitutional:      Appearance: Normal appearance. He is obese.  Cardiovascular:     Rate and Rhythm: Normal rate and regular rhythm.     Pulses: Normal pulses.     Heart sounds: Normal heart sounds.  Pulmonary:     Effort: Pulmonary effort is normal.     Breath sounds: Examination of the right-lower field reveals decreased breath sounds. Examination of the left-lower field reveals decreased breath sounds. Decreased breath sounds present.  Abdominal:     Palpations: Abdomen is soft. There is no hepatomegaly, splenomegaly or mass.     Tenderness: There is no  abdominal tenderness.  Musculoskeletal:     Right lower leg: No edema.     Left lower leg: No edema.  Neurological:     General: No focal deficit present.     Mental Status: He is alert and oriented to person, place, and time.  Psychiatric:        Mood and Affect: Mood normal.        Behavior: Behavior normal.     LABORATORY DATA:  I have reviewed the data as listed CBC Latest Ref Rng & Units 07/29/2021 07/28/2021 07/26/2021  WBC 4.0 - 10.5 K/uL 9.1 9.6 17.4(H)  Hemoglobin 13.0 - 17.0 g/dL 7.6(L) 7.5(L) 8.7(L)  Hematocrit 39.0 - 52.0 % 24.4(L) 25.3(L) 27.7(L)  Platelets  150 - 400 K/uL 337 342 363   CMP Latest Ref Rng & Units 07/29/2021 07/28/2021 07/27/2021  Glucose 70 - 99 mg/dL 138(H) 189(H) 93  BUN 6 - 20 mg/dL 17 17 21(H)  Creatinine 0.61 - 1.24 mg/dL 1.08 0.99 1.03  Sodium 135 - 145 mmol/L 136 134(L) 138  Potassium 3.5 - 5.1 mmol/L 3.9 4.3 4.9  Chloride 98 - 111 mmol/L 99 96(L) 93(L)  CO2 22 - 32 mmol/L 29 30 28   Calcium 8.9 - 10.3 mg/dL 8.3(L) 8.3(L) 9.0  Total Protein 6.5 - 8.1 g/dL - - -  Total Bilirubin 0.3 - 1.2 mg/dL - - -  Alkaline Phos 38 - 126 U/L - - -  AST 15 - 41 U/L - - -  ALT 0 - 44 U/L - - -    RADIOGRAPHIC STUDIES: I have personally reviewed the radiological images as listed and agreed with the findings in the report. DG Chest 2 View  Result Date: 07/28/2021 CLINICAL DATA:  Chest tube removed 2 hours ago EXAM: CHEST - 2 VIEW COMPARISON:  07/28/2021 7:28 a.m. FINDINGS: Interval removal of right chest tube. Persistent right apical pneumothorax, which appears unchanged. Persistent small right pleural effusion layering in the right lower lung, which obscures previously noted right lower lung airspace disease. No left pleural effusion. No significant pulmonary opacity in the left lung. Mild cardiomegaly. Unchanged mediastinal contours. Air in the soft tissues, likely related to the prior chest tube. No acute osseous abnormality. IMPRESSION: Interval removal of right  chest tube with persistent right apical pneumothorax and small right pleural effusion. Electronically Signed   By: Merilyn Baba M.D.   On: 07/28/2021 11:10   CT Angio Chest PE W and/or Wo Contrast  Result Date: 07/19/2021 CLINICAL DATA:  Pulmonary embolism suspected, high probability. EXAM: CT ANGIOGRAPHY CHEST WITH CONTRAST TECHNIQUE: Multidetector CT imaging of the chest was performed using the standard protocol during bolus administration of intravenous contrast. Multiplanar CT image reconstructions and MIPs were obtained to evaluate the vascular anatomy. CONTRAST:  130mL OMNIPAQUE IOHEXOL 350 MG/ML SOLN COMPARISON:  None. FINDINGS: Cardiovascular: The heart is normal in size and there is no pericardial effusion. Scattered coronary artery calcifications are noted. There is atherosclerotic calcification of the aorta without evidence of aneurysm. Pulmonary trunk is normal in caliber. No large central pulmonary artery filling defect is identified. There is suboptimal opacification and compressive atelectasis evaluation of the segmental and subsegmental arteries. Mediastinum/Nodes: No mediastinal, hilar, or axillary lymphadenopathy. A subcentimeter hypodensity is present in the left lobe of the thyroid gland which is too small to further characterize. The trachea and esophagus are within normal limits. Lungs/Pleura: There are large bilateral pleural effusion with compressive atelectasis bilaterally. No pneumothorax. Upper Abdomen: A rim calcified mass is present in the left upper quadrant inferior to the spleen, only partially visualized on exam. Musculoskeletal: Degenerative changes in the thoracic spine. No acute osseous abnormality. Review of the MIP images confirms the above findings. IMPRESSION: 1. No large central pulmonary artery embolism. There is suboptimal evaluation of the segmental subsegmental arteries. 2. Large bilateral pleural effusions with compressive atelectasis. 3. Coronary artery  calcifications. 4. Aortic atherosclerosis. 5. Rim calcified mass in the left upper quadrant of unknown etiology. CT abdomen and pelvis is recommended for further evaluation. Electronically Signed   By: Brett Fairy M.D.   On: 07/19/2021 04:32   NM Pulmonary Perfusion  Result Date: 07/12/2021 CLINICAL DATA:  Shortness of breath for 1 week, COVID positive EXAM: NUCLEAR MEDICINE PERFUSION LUNG  SCAN TECHNIQUE: Perfusion images were obtained in multiple projections after intravenous injection of radiopharmaceutical. Ventilation scans intentionally deferred if perfusion scan and chest x-ray adequate for interpretation during COVID 19 epidemic. RADIOPHARMACEUTICALS:  4.4 mCi Tc-15m MAA IV COMPARISON:  Chest radiograph dated July 12, 2021 FINDINGS: No peripheral filling defects to suggest pulmonary embolism. IMPRESSION: Normal or low probability of pulmonary embolism. Electronically Signed   By: Keane Police D.O.   On: 07/12/2021 10:13   CT CHEST ABDOMEN PELVIS W CONTRAST  Result Date: 07/21/2021 CLINICAL DATA:  Anemia. Partially imaged rim calcified mass in the left upper quadrant of the abdomen on prior chest CT. EXAM: CT CHEST, ABDOMEN, AND PELVIS WITH CONTRAST TECHNIQUE: Multidetector CT imaging of the chest, abdomen and pelvis was performed following the standard protocol during bolus administration of intravenous contrast. RADIATION DOSE REDUCTION: This exam was performed according to the departmental dose-optimization program which includes automated exposure control, adjustment of the mA and/or kV according to patient size and/or use of iterative reconstruction technique. CONTRAST:  17mL OMNIPAQUE IOHEXOL 300 MG/ML  SOLN COMPARISON:  Chest CTA 07/19/2021. No other relevant comparison studies. FINDINGS: CT CHEST FINDINGS Cardiovascular: No acute vascular findings are seen. There is suboptimal opacification of the pulmonary arteries, but no evidence of acute pulmonary embolism. Mild atherosclerosis of the  aorta, great vessels and coronary arteries. The heart size is normal. There is no pericardial effusion. Mediastinum/Nodes: There are no enlarged mediastinal, hilar or axillary lymph nodes. The thyroid gland, trachea and esophagus demonstrate no significant findings. Lungs/Pleura: Significant improvement in the previously demonstrated large bilateral pleural effusions. Mild residual pleural effusions are present bilaterally. There is persistent dense consolidation in both lower lobes, left greater than right. In addition, there is mild dependent airspace disease within the right middle lobe. The upper lobes are clear. No suspicious pulmonary nodules. Mild centrilobular emphysema. Musculoskeletal/Chest wall: No chest wall mass or suspicious osseous findings. Mild thoracic spondylosis. CT ABDOMEN AND PELVIS FINDINGS Hepatobiliary: Subjective mild hepatic steatosis without focal abnormality or abnormal enhancement. No evidence of gallstones, gallbladder wall thickening or biliary dilatation. Pancreas: Unremarkable. No pancreatic ductal dilatation or surrounding inflammatory changes. Spleen: Normal in size without focal abnormality. Adrenals/Urinary Tract: The right adrenal gland appears normal. There is splaying of the limbs of the left adrenal gland by the partially calcified left renal mass, further described below. No adrenal mass identified. There is a large peripherally calcified mass involving the upper pole of the left kidney, measuring up to 11.1 x 10.7 cm transverse on image 67/3. This measures up to 10.7 cm craniocaudal on coronal image 115/5. In addition to peripheral calcifications, there are speckled internal calcifications within this lesion which is incompletely characterized. No other renal masses are identified. The right kidney appears normal. There is no hydronephrosis. The bladder appears unremarkable. Stomach/Bowel: Enteric contrast was administered and has passed into the mid colon. The stomach  appears unremarkable for its degree of distension. No evidence of bowel wall thickening, distention or surrounding inflammatory change. The appendix appears normal. Vascular/Lymphatic: There are no enlarged abdominal or pelvic lymph nodes. Mild aortic and branch vessel atherosclerosis without acute vascular findings or aneurysm. The portal, superior mesenteric and splenic veins are patent. The renal veins and IVC appear normal. Reproductive: Mild enlargement of the prostate gland. The seminal vesicles appear unremarkable. Other: Small umbilical hernia containing only fat.  No ascites. Musculoskeletal: No acute or significant osseous findings. Multilevel spondylosis contributing to biforaminal narrowing at L4-5 and L5-S1. IMPRESSION: 1. Interval significant improvement in previously  demonstrated bilateral pleural effusions. There is persistent lower lobe consolidation bilaterally suspicious for pneumonia. Continued radiographic follow-up recommended. 2. Indeterminate partially calcified mass involving the upper pole of the left kidney. This is not clearly cystic by single-phase CT and demonstrates internal calcifications. Further evaluation recommended to exclude neoplasm. Optimally, this would be evaluated with dedicated renal MRI (without and with contrast) when the patient is clinically stable and able to follow directions and hold their breath (preferably as an outpatient). If unable to tolerate MRI, dedicated renal CT (pre and post-contrast imaging) could be performed. 3. Coronary and Aortic Atherosclerosis (ICD10-I70.0). Electronically Signed   By: Richardean Sale M.D.   On: 07/21/2021 14:42   DG Chest Port 1 View  Result Date: 07/28/2021 CLINICAL DATA:  Pleural effusion. EXAM: PORTABLE CHEST 1 VIEW COMPARISON:  Seventeen 2023 FINDINGS: Right chest tube remains in place. Possible small right apical pneumothorax has developed in the interval. Small right effusion unchanged. Right lower lobe airspace disease  unchanged. Mild left lower lobe airspace disease unchanged. IMPRESSION: Right chest tube remains in place. Question small right apical pneumothorax. Small right effusion and right lower lobe airspace disease unchanged. Electronically Signed   By: Franchot Gallo M.D.   On: 07/28/2021 10:05   DG CHEST PORT 1 VIEW  Result Date: 07/27/2021 CLINICAL DATA:  Chest tube present and respiratory distress EXAM: PORTABLE CHEST 1 VIEW COMPARISON:  Yesterday FINDINGS: Right chest tube is unchanged in position. Right apical pleural thickening is new, indicative of pleural fluid. The previously described apical pneumothorax is not identified. There may be a small amount of sub pulmonic pleural air medially. Subcutaneous emphysema about the right chest wall is mildly increased. Midline trachea. Mild cardiomegaly. Improved patchy right-sided airspace disease. Minimal left base atelectasis laterally. IMPRESSION: Right chest tube remaining in place. The apical pneumothorax has been replaced by small volume pleural fluid. There may be residual sub pulmonic small volume right-sided pleural air. Cardiomegaly with improved right-sided airspace disease. Electronically Signed   By: Abigail Miyamoto M.D.   On: 07/27/2021 08:46   DG Chest Port 1 View  Result Date: 07/26/2021 CLINICAL DATA:  Follow-up right chest tube. EXAM: PORTABLE CHEST 1 VIEW COMPARISON:  07/25/2021 FINDINGS: Stable cardiomediastinal contours right chest tube is in place and stable from previous exam. Tiny pneumothorax is identified which measures approximately 4 mm over the apical portions of the right lung. The rib this does not appear significantly changed from previous exam. Opacities within the right midlung and right lower lung are unchanged. Left lung is clear. IMPRESSION: 1. Stable right chest tube with tiny right apical pneumothorax. 2. No change in aeration to the right lung. Electronically Signed   By: Kerby Moors M.D.   On: 07/26/2021 08:48   DG Chest  Port 1 View  Result Date: 07/25/2021 CLINICAL DATA:  Postop imaging. EXAM: PORTABLE CHEST 1 VIEW COMPARISON:  07/24/2021. FINDINGS: Significant improvement in right lung aeration since the prior study. The near complete opacification of the right hemithorax has been replaced by patchy airspace opacities throughout and aerated right lung, with more confluent opacity at the right lung base. No pneumothorax. Right-sided chest tube has its tip near the right apex. IMPRESSION: 1. Significant improvement in right lung aeration consistent with evacuation of a right pleural effusion. 2. No pneumothorax.  Well-positioned right-sided chest tube. 3. Residual opacity noted throughout the right lung is consistent with atelectasis or infection. Electronically Signed   By: Lajean Manes M.D.   On: 07/25/2021 10:10  DG Chest Port 1 View  Result Date: 07/24/2021 CLINICAL DATA:  Follow-up large pleural effusion EXAM: PORTABLE CHEST 1 VIEW COMPARISON:  Chest radiograph from one day prior. FINDINGS: Stable cardiomediastinal silhouette with top-normal heart size. No pneumothorax. Large right pleural effusion nearly completely opacifying the right hemithorax, increased. No left pleural effusion. Clear left lung. IMPRESSION: Large right pleural effusion, nearly completely opacifying the right hemithorax, increased. Electronically Signed   By: Ilona Sorrel M.D.   On: 07/24/2021 08:10   DG CHEST PORT 1 VIEW  Result Date: 07/23/2021 CLINICAL DATA:  Pleural effusion. Shortness of breath. Respiratory distress. EXAM: PORTABLE CHEST 1 VIEW COMPARISON:  07/22/2021; 07/21/2021; 07/20/2021; CT of the chest, abdomen and pelvis 07/21/2021 FINDINGS: Grossly unchanged enlarged cardiac silhouette and mediastinal contours. Interval increase in now large loculated right-sided pleural effusion with associated progressive compressive atelectasis/collapse without definitive mediastinal shift. Unchanged left basilar heterogeneous consolidative  opacities. No definite left-sided pleural effusion. No evidence of edema. No pneumothorax. No acute osseous abnormalities. Degenerative change of the bilateral acromioclavicular joints. Stigmata of DISH within thoracic spine. IMPRESSION: 1. Progression of now large loculated right-sided effusion with associated progressive atelectasis/collapse. Further evaluation with repeat contrast-enhanced chest CT could be performed as indicated. 2. Unchanged left basilar heterogeneous consolidative opacities again worrisome for infection or aspiration. These results will be called to the ordering clinician or representative by the Radiologist Assistant, and communication documented in the PACS or [employer]. Electronically Signed   By: Sandi Mariscal M.D.   On: 07/23/2021 07:55   DG CHEST PORT 1 VIEW  Result Date: 07/22/2021 CLINICAL DATA:  Evaluate lungs EXAM: PORTABLE CHEST 1 VIEW COMPARISON:  Chest x-ray dated July 21, 2021 FINDINGS: Cardiac and mediastinal contours are unchanged. Moderate loculated right pleural effusion, increased in size when compared with prior exam. Bibasilar opacities. No evidence of pneumothorax. IMPRESSION: 1. Moderate loculated right pleural effusion, increased in size when compared to prior exam. 2. Bibasilar opacities, concerning for infection or aspiration. Electronically Signed   By: Yetta Glassman M.D.   On: 07/22/2021 10:51   DG CHEST PORT 1 VIEW  Result Date: 07/21/2021 CLINICAL DATA:  Status post thoracentesis. EXAM: PORTABLE CHEST 1 VIEW COMPARISON:  Chest radiograph 07/21/2021 at 6:40 a.m. FINDINGS: The cardiomediastinal silhouette is unchanged with normal heart size. There is a small right pleural effusion, decreased following interval thoracentesis. A small left-sided pleural effusion remains. There are patchy bibasilar airspace opacities, unchanged on the left and with improved aeration of the right lung base following thoracentesis. No pneumothorax is identified.  IMPRESSION: 1. Decreased right pleural effusion following thoracentesis. No pneumothorax. 2. Persistent small bilateral. Pleural effusions and bibasilar atelectasis or infiltrates Electronically Signed   By: Logan Bores M.D.   On: 07/21/2021 10:27   DG Chest Port 1 View  Result Date: 07/21/2021 CLINICAL DATA:  Shortness of breath, respiratory distress, pleural effusions. EXAM: PORTABLE CHEST 1 VIEW COMPARISON:  Chest radiograph dated July 20, 2020. FINDINGS: The heart is enlarged. Hazy opacity in the right lower lung likely representing large pleural effusion with associated atelectasis. Small left pleural effusion. Osseous structures are unremarkable. IMPRESSION: 1. Cardiomegaly. 2. Large right pleural effusion with associated atelectasis. Underlying airspace disease can not be excluded. Small left pleural effusion. Electronically Signed   By: Keane Police D.O.   On: 07/21/2021 08:59   DG CHEST PORT 1 VIEW  Result Date: 07/20/2021 CLINICAL DATA:  56 year old male with history of shortness of breath. Status post thoracentesis. EXAM: PORTABLE CHEST 1 VIEW COMPARISON:  Chest x-ray 07/20/2021. FINDINGS: Persistent moderate to large right pleural effusion appears slightly decreased compared to the prior study. No definite pneumothorax. Opacity in the right lung base, favored to predominantly reflect areas of passive subsegmental atelectasis although underlying airspace consolidation is not excluded. Left lung is clear. No evidence of pulmonary edema. Heart size is normal. Upper mediastinal contours are within normal limits. IMPRESSION: 1. Slightly decreased size of moderate to large right pleural effusion following thoracentesis. No postprocedural pneumothorax. Persistent passive atelectasis in the right lung base. Electronically Signed   By: Vinnie Langton M.D.   On: 07/20/2021 12:20   DG Chest Port 1 View  Result Date: 07/20/2021 CLINICAL DATA:  Respiratory distress and pleural effusions. EXAM:  PORTABLE CHEST 1 VIEW COMPARISON:  Portable chest today at 15:14, chest CT today at 02:43. FINDINGS: 4:15 a.m., 07/20/2021. The cardiac size is normal. The central vessels are borderline prominent without flow cephalization and no edema is seen. Moderate/large right and moderate left pleural effusions continue to be seen with overlying opacities consistent with atelectasis or consolidation, greater on the right. The left mid and both upper lung fields remain clear. No new or worsening lung opacity is seen. The pleural effusions seem unchanged. IMPRESSION: Right-greater-than-left pleural effusions with overlying atelectasis or consolidation. Overall aeration seems unchanged. Electronically Signed   By: Telford Nab M.D.   On: 07/20/2021 05:08   DG Chest Portable 1 View  Result Date: 07/19/2021 CLINICAL DATA:  thoracentesis EXAM: PORTABLE CHEST 1 VIEW COMPARISON:  Chest x-ray 07/19/2021. FINDINGS: The heart and mediastinal contours are unchanged. Bibasilar vague airspace opacity. No pulmonary edema. Bilateral trace to small bilateral effusions. No pneumothorax. No acute osseous abnormality. IMPRESSION: Bilateral trace to small bilateral effusions. Associated bibasilar vague airspace opacities likely representing atelectasis. Superimposed infection/inflammation not excluded. Electronically Signed   By: Iven Finn M.D.   On: 07/19/2021 15:28   DG Chest Portable 1 View  Result Date: 07/19/2021 CLINICAL DATA:  Status post thoracentesis.  Shortness of breath. EXAM: PORTABLE CHEST 1 VIEW COMPARISON:  Chest radiograph 07/18/2021 and chest CTA 07/19/2021 FINDINGS: The cardiomediastinal silhouette is unchanged with normal heart size. There are persistent right larger than left pleural effusions with associated lower lobe atelectasis. Lung aeration is overall similar to yesterday's chest radiograph. No pneumothorax is identified. IMPRESSION: No pneumothorax. Bilateral pleural effusions and lower lobe atelectasis.  Electronically Signed   By: Logan Bores M.D.   On: 07/19/2021 10:36   DG Chest Port 1 View  Result Date: 07/18/2021 CLINICAL DATA:  Shortness of breath EXAM: PORTABLE CHEST 1 VIEW COMPARISON:  07/12/2021 FINDINGS: Bilateral lower lobe airspace opacities and layering effusions. This is stable on the left, worsened on the right since prior study. Heart is borderline in size. No acute bony abnormality. IMPRESSION: Bilateral effusions with lower lobe atelectasis or infiltrates, worsening on the right since prior study. Electronically Signed   By: Rolm Baptise M.D.   On: 07/18/2021 22:14   DG Chest Portable 1 View  Result Date: 07/12/2021 CLINICAL DATA:  Exertional shortness of breath and cough. Tested positive for COVID 8 days ago. EXAM: PORTABLE CHEST 1 VIEW COMPARISON:  There are prior chest films from 2018 and 2014 but they could not be retrieved from PACS. FINDINGS: There is mild cardiomegaly. There is slight central vascular prominence. No pulmonary edema is seen. There is a moderate left pleural effusion with adjacent consolidation or atelectasis in the left lower lung field. Findings may suggest a left lower lobe pneumonia with parapneumonic pleural  effusion. The remaining lungs are clear. The right sulci are sharp. Thoracic spondylosis. IMPRESSION: Left pleural effusion with adjacent consolidation or atelectasis in the left lower lung field. Clinical correlation and radiographic follow-up recommended. Mild cardiomegaly with slightly prominent central vessels. Electronically Signed   By: Telford Nab M.D.   On: 07/12/2021 02:53   ECHOCARDIOGRAM COMPLETE  Result Date: 07/19/2021    ECHOCARDIOGRAM REPORT   Patient Name:   SILVER PARKEY Date of Exam: 07/19/2021 Medical Rec #:  [id number]       Height:       72.0 in Accession #:    [id number]      Weight:       314.0 lb Date of Birth:  1965-12-08       BSA:          2.581 m Patient Age:    37 years        BP:           126/89 mmHg Patient Gender: M                HR:           116 bpm. Exam Location:  Inpatient Procedure: 2D Echo, Cardiac Doppler, Color Doppler and Intracardiac            Opacification Agent Indications:    CHF  History:        Patient has no prior history of Echocardiogram examinations.                 Risk Factors:Hypertension and Diabetes.  Sonographer:    Jyl Heinz Referring Phys: R[place]  1. Left ventricular ejection fraction, by estimation, is 55 to 60%. The left ventricle has normal function. The left ventricle has no regional wall motion abnormalities. There is moderate asymmetric left ventricular hypertrophy of the basal-septal segment. Left ventricular diastolic parameters are indeterminate.  2. Right ventricular systolic function is normal. The right ventricular size is normal. Tricuspid regurgitation signal is inadequate for assessing PA pressure.  3. The mitral valve is normal in structure. No evidence of mitral valve regurgitation.  4. The aortic valve was not well visualized. Aortic valve regurgitation is not visualized. No aortic stenosis is present. FINDINGS  Left Ventricle: Left ventricular ejection fraction, by estimation, is 55 to 60%. The left ventricle has normal function. The left ventricle has no regional wall motion abnormalities. The left ventricular internal cavity size was small. There is moderate  asymmetric left ventricular hypertrophy of the basal-septal segment. Left ventricular diastolic parameters are indeterminate. Right Ventricle: The right ventricular size is normal. Right vetricular wall thickness was not well visualized. Right ventricular systolic function is normal. Tricuspid regurgitation signal is inadequate for assessing PA pressure. Left Atrium: Left atrial size was not well visualized. Right Atrium: Right atrial size was not well visualized. Pericardium: There is no evidence of pericardial effusion. Mitral Valve: The mitral valve is normal in structure. No evidence of mitral valve  regurgitation. Tricuspid Valve: The tricuspid valve is normal in structure. Tricuspid valve regurgitation is trivial. Aortic Valve: The aortic valve was not well visualized. Aortic valve regurgitation is not visualized. No aortic stenosis is present. Aortic valve peak gradient measures 5.3 mmHg. Pulmonic Valve: The pulmonic valve was not well visualized. Pulmonic valve regurgitation is not visualized. Aorta: The aortic root and ascending aorta are structurally normal, with no evidence of dilitation. IAS/Shunts: The interatrial septum was not well visualized.  LEFT VENTRICLE PLAX 2D LVIDd:  3.70 cm     Diastology LVIDs:         2.60 cm     LV e' medial:    7.62 cm/s LV PW:         1.20 cm     LV E/e' medial:  6.2 LV IVS:        1.70 cm     LV e' lateral:   7.72 cm/s LVOT diam:     2.20 cm     LV E/e' lateral: 6.2 LV SV:         34 LV SV Index:   13 LVOT Area:     3.80 cm  LV Volumes (MOD) LV vol d, MOD A2C: 89.9 ml LV vol d, MOD A4C: 67.0 ml LV vol s, MOD A2C: 40.5 ml LV vol s, MOD A4C: 28.5 ml LV SV MOD A2C:     49.4 ml LV SV MOD A4C:     67.0 ml LV SV MOD BP:      47.7 ml RIGHT VENTRICLE RV Basal diam:  3.70 cm RV Mid diam:    3.20 cm RV S prime:     16.40 cm/s TAPSE (M-mode): 1.7 cm LEFT ATRIUM         Index       RIGHT ATRIUM           Index LA diam:    2.30 cm 0.89 cm/m  RA Area:     10.80 cm                                 RA Volume:   22.60 ml  8.76 ml/m  AORTIC VALVE AV Area (Vmax): 2.61 cm AV Vmax:        115.00 cm/s AV Peak Grad:   5.3 mmHg LVOT Vmax:      79.00 cm/s LVOT Vmean:     52.100 cm/s LVOT VTI:       0.089 m  AORTA Ao Root diam: 3.30 cm Ao Asc diam:  3.30 cm MITRAL VALVE MV Area (PHT): 5.23 cm    SHUNTS MV Decel Time: 145 msec    Systemic VTI:  0.09 m MV E velocity: 47.60 cm/s  Systemic Diam: 2.20 cm MV A velocity: 72.40 cm/s MV E/A ratio:  0.66 Oswaldo Milian MD Electronically signed by Oswaldo Milian MD Signature Date/Time: 07/19/2021/4:04:25 PM    Final    CT RENAL ABD  W/WO  Result Date: 07/24/2021 CLINICAL DATA:  Indeterminate left renal lesion on recent CT. EXAM: CT ABDOMEN WITHOUT AND WITH CONTRAST TECHNIQUE: Multidetector CT imaging of the abdomen was performed following the standard protocol before and following the bolus administration of intravenous contrast. RADIATION DOSE REDUCTION: This exam was performed according to the departmental dose-optimization program which includes automated exposure control, adjustment of the mA and/or kV according to patient size and/or use of iterative reconstruction technique. CONTRAST:  30mL OMNIPAQUE IOHEXOL 350 MG/ML SOLN COMPARISON:  07/21/2021 FINDINGS: Lower chest: Increased size of large right pleural effusion with compressive atelectasis right lower lung. Decreased airspace disease in posterior left lower lobe. Persistent tiny left pleural effusion. Hepatobiliary: No hepatic masses identified. Gallbladder is unremarkable. No evidence of biliary ductal dilatation. Pancreas:  No mass or inflammatory changes. Spleen:  Within normal limits in size and appearance. Adrenals/Urinary Tract: Normal appearance of adrenal glands and right kidney. A large low-attenuation lesion is seen in the upper pole of the left  kidney, which measures 10.7 x 10.6 cm. This shows thin peripheral rim calcifications, as well as scattered internal calcifications likely within internal septations. This lesion shows no evidence of internal contrast enhancement. This meets criteria for a probably benign Bosniak category 37F lesion. No evidence of hydronephrosis. Stomach/Bowel: Diverticulosis is seen involving the visualized portion of the transverse colon, without signs of diverticulitis in this region. Vascular/Lymphatic: No pathologically enlarged lymph nodes identified. No acute vascular findings. Aortic atherosclerotic calcification noted. Other:  None. Musculoskeletal:  No suspicious bone lesions identified. IMPRESSION: 10.6 cm complex cystic lesion in upper  pole of left kidney, consistent with an indeterminate but probably benign Bosniak category 37F lesion. Recommend continued imaging follow-up in 6 months, preferably with abdomen MRI without and with contrast. Increased size of large right pleural effusion and compressive atelectasis. Decreased airspace disease in posterior left lower lobe. Persistent tiny left pleural effusion. Aortic Atherosclerosis (ICD10-I70.0). Electronically Signed   By: Marlaine Hind M.D.   On: 07/24/2021 15:25   VAS [place] LOWER EXTREMITY VENOUS (DVT)  Result Date: 07/23/2021  Lower Venous DVT Study Patient Name:  ANTHONNY SCHILLER  Date of Exam:   07/23/2021 Medical Rec #: [id number]        Accession #:    [id number] Date of Birth: 19-Sep-1965        Patient Gender: M Patient Age:   74 years Exam Location:  [facility] Procedure:      VAS [place] LOWER EXTREMITY VENOUS (DVT) Referring Phys: Noemi Chapel --------------------------------------------------------------------------------  Indications: Edema.  Comparison Study: No previous exams Performing Technologist: Jody Hill RVT, RDMS  Examination Guidelines: A complete evaluation includes B-mode imaging, spectral Doppler, color Doppler, and power Doppler as needed of all accessible portions of each vessel. Bilateral testing is considered an integral part of a complete examination. Limited examinations for reoccurring indications may be performed as noted. The reflux portion of the exam is performed with the patient in reverse Trendelenburg.  +---------+---------------+---------+-----------+----------+-------------------+  RIGHT     Compressibility Phasicity Spontaneity Properties Thrombus Aging       +---------+---------------+---------+-----------+----------+-------------------+  CFV       Full            Yes       Yes                                         +---------+---------------+---------+-----------+----------+-------------------+  SFJ       Full                                                                   +---------+---------------+---------+-----------+----------+-------------------+  FV Prox   Full            Yes       Yes                                         +---------+---------------+---------+-----------+----------+-------------------+  FV Mid    Full            Yes       Yes  Not well visualized  +---------+---------------+---------+-----------+----------+-------------------+  FV Distal Full            Yes       Yes                    Not well visualized  +---------+---------------+---------+-----------+----------+-------------------+  PFV       Full                                                                  +---------+---------------+---------+-----------+----------+-------------------+  POP       Full            Yes       Yes                                         +---------+---------------+---------+-----------+----------+-------------------+  PTV       Full                                                                  +---------+---------------+---------+-----------+----------+-------------------+  PERO      Full                                                                  +---------+---------------+---------+-----------+----------+-------------------+   +---------+---------------+---------+-----------+----------+-------------------+  LEFT      Compressibility Phasicity Spontaneity Properties Thrombus Aging       +---------+---------------+---------+-----------+----------+-------------------+  CFV       Full            Yes       Yes                                         +---------+---------------+---------+-----------+----------+-------------------+  SFJ       Full                                                                  +---------+---------------+---------+-----------+----------+-------------------+  FV Prox   Full            Yes       Yes                                          +---------+---------------+---------+-----------+----------+-------------------+  FV Mid    Full            Yes  Yes                    Not well visualized  +---------+---------------+---------+-----------+----------+-------------------+  FV Distal Full            Yes       Yes                    Not well visualized  +---------+---------------+---------+-----------+----------+-------------------+  PFV       Full                                                                  +---------+---------------+---------+-----------+----------+-------------------+  POP       Full            Yes       Yes                                         +---------+---------------+---------+-----------+----------+-------------------+  PTV       Full                                                                  +---------+---------------+---------+-----------+----------+-------------------+  PERO      Full                                                                  +---------+---------------+---------+-----------+----------+-------------------+     Summary: BILATERAL: - No evidence of deep vein thrombosis seen in the lower extremities, bilaterally. - No evidence of superficial venous thrombosis in the lower extremities, bilaterally. - RIGHT: - No cystic structure found in the popliteal fossa.  LEFT: - A cystic structure is found in the popliteal fossa.  *See table(s) above for measurements and observations. Electronically signed by Jamelle Haring on 07/23/2021 at 2:54:28 PM.    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  Internal calcifications present. - VATS and right pleural biopsy on 07/25/2021 consistent with metastatic carcinoma, IHC stains positive for AE1/AE3 and PAX8.  Negative for CK7, weak positivity with Melan-A.  2 additional melanoma markers SOX-10 and  HMB45 negative.  TTF-1, PSA, OCT 3/4, CD56, synaptophysin, calretinin negative.  Overall the findings are mostly suggestive of renal cell carcinoma.   Social/family history: - He lives at home by himself. 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 - He smokes 4 to 5 cigarettes/day for last 15 years.  3.  Rheumatoid arthritis: - Followed by Dr. Benjamine Mola.  Diagnosed with seropositive rheumatoid arthritis and was treated with methotrexate 15 mg p.o. weekly.  Involved joints left wrist and left knee and  right knee to a lesser extent.   PLAN:  Metastatic kidney cancer to pleura: - We talked about pathology report in detail and reviewed images of the CT scan. - We talked about normal prognosis of metastatic kidney cancer and treatment intent in the palliative setting. - His recent labs indicate severe anemia during hospitalization.  This puts him at at least [facility] intermediate grade. - We discussed the frontline treatment options including double immunotherapy versus combination immunotherapy with  VEGF TKI. - Given his rheumatoid arthritis, there is a chance of reactivation of the disease if we start on immunotherapy. - We will reach out to his rheumatologist Dr. Benjamine Mola. - We will also consider single agent TKI with pazopanib/cabozantinib if immunotherapy is absolutely contraindicated. - We will also consider genetic testing and NGS testing. - Recommend bone scan to complete work-up.  We will also check LDH level.  2.  Microcytic anemia: - We will do ferritin, iron panel, T62, folic acid and methylmalonic acid along with CBC.  3.  Shortness of breath: - I have repeated chest x-ray today.  It showed small right pleural effusion.  No indication for thoracentesis at this time.   All questions were answered. The patient knows to call the clinic with any problems, questions or concerns.   Derek Jack, MD, 08/09/21 8:57 AM  [place] [phone]   I, Thana Ates, am  acting as a scribe for Dr. Derek Jack.  I, Derek Jack MD, have reviewed the above documentation for accuracy and completeness, and I agree with the above.

## 2021-08-13 ENCOUNTER — Other Ambulatory Visit: Payer: Self-pay

## 2021-08-13 ENCOUNTER — Encounter (HOSPITAL_COMMUNITY): Payer: Self-pay

## 2021-08-13 ENCOUNTER — Encounter: Payer: Self-pay | Admitting: Nurse Practitioner

## 2021-08-13 ENCOUNTER — Ambulatory Visit (INDEPENDENT_AMBULATORY_CARE_PROVIDER_SITE_OTHER): Payer: Managed Care, Other (non HMO) | Admitting: Nurse Practitioner

## 2021-08-13 ENCOUNTER — Encounter (HOSPITAL_COMMUNITY)
Admission: RE | Admit: 2021-08-13 | Discharge: 2021-08-13 | Disposition: A | Discharge status: Home / Self Care | Payer: Managed Care, Other (non HMO) | Source: Ambulatory Visit | Attending: Hematology | Admitting: Hematology

## 2021-08-13 VITALS — BP 120/90 | HR 106 | Temp 98.8°F | Ht 72.0 in | Wt 299.8 lb

## 2021-08-13 DIAGNOSIS — C642 Malignant neoplasm of left kidney, except renal pelvis: Secondary | ICD-10-CM | POA: Insufficient documentation

## 2021-08-13 DIAGNOSIS — J90 Pleural effusion, not elsewhere classified: Secondary | ICD-10-CM | POA: Diagnosis not present

## 2021-08-13 DIAGNOSIS — C7800 Secondary malignant neoplasm of unspecified lung: Secondary | ICD-10-CM

## 2021-08-13 DIAGNOSIS — J95811 Postprocedural pneumothorax: Secondary | ICD-10-CM

## 2021-08-13 DIAGNOSIS — J939 Pneumothorax, unspecified: Secondary | ICD-10-CM | POA: Insufficient documentation

## 2021-08-13 MED ORDER — TECHNETIUM TC 99M MEDRONATE IV KIT
20.0000 | PACK | Freq: Once | INTRAVENOUS | Status: AC | PRN
  Administered 2021-08-13: 20.1 via INTRAVENOUS

## 2021-08-13 NOTE — Patient Instructions (Addendum)
-  Continue albuterol 1-2 puffs every 4 hours as needed for shortness of breath/wheezing  Complete financial assistance application and return to your PCP or our office in [place].   Notify if worsening shortness of breath, cough, or other respiratory symptoms develop  Follow up in 4-6 weeks with Dr. Elsworth Soho at [place] office. If symptoms do not improve or worsen, please contact office for sooner follow up or seek emergency care.

## 2021-08-13 NOTE — Assessment & Plan Note (Addendum)
Stable, small right sided on CXR from 1/30. SOB continues to improve, slowly. Residual likely r/t deconditioning and lung injury. Plans to repeat CXR on 2/8 with f/u with Dr. Cyndia Bent. Discussed return precautions. No complaint of DOE prior to this hospitalization. Walking oximetry today without desaturations.  Patient Instructions  -Continue albuterol 1-2 puffs every 4 hours as needed for shortness of breath/wheezing  Complete financial assistance application and return to your PCP or our office in [place].   Notify if worsening shortness of breath, cough, or other respiratory symptoms develop  Follow up in 4-6 weeks with Dr. Elsworth Soho at [place] office. If symptoms do not improve or worsen, please contact office for sooner follow up or seek emergency care.

## 2021-08-13 NOTE — Assessment & Plan Note (Signed)
Small residual R pneumo, decreased on recent CXR 1/30. SOB stable and slowly improving. CXR scheduled for 2/8.

## 2021-08-13 NOTE — Progress Notes (Signed)
@Patient  ID: Derek Owen, male    DOB: [birth year], 56 y.o.   MRN: [id number]  Chief Complaint  Patient presents with   Follow-up    SOBE    Referring provider: Renee Rival, FNP  HPI: 56 year old male, current every day smoker followed for recurrent pleural effusions and acute respiratory failure. He is new to the pulmonary clinic and last seen in the hospital by Dr. Elsworth Soho and Dr. Tamala Julian. He was admitted from 07/18/2021-07/30/2021 and underwent VATS with pleural biopsy with pathology significant for possible malignant renal cell cancer. He was diagnosed with stage IV left kidney parenchyma cancer. Past medical history significant for HTN, DM II, gout, IDA, RA on methotrexate, and obesity. Followed by Dr. Delton Coombes with oncology.   TEST/EVENTS:  07/19/2021 CTA chest: No PE. Subcm hypodensity in left lobe of thyroid, too small to characterize. Large b/l pleural effusion with compressive atelectasis. Atherosclerosis/CAD. Rim calcified mass in LUQ 07/19/2021 Echocardiogram: EF 55-60%. Moderate asymmetric LVH. LV diastolic indeterminate.  08/09/2021 CXR 2 view: small right pleural effusion w/o significant change. Small residual right pneumo, decreased in size and partially filled with pleural fluid. Increased right basilar atelectasis. Mild cardiomegaly.   07/18/2021-07/30/2021: Hospitalization for acute respiratory failure requiring BiPAP and recurrent pleural effusions. COVID 12/23, given paxlovid -> ED visit at [facility] on 1/2 with elevated d dimer and CXR new L pleural effusion, d/c on steroids and albuterol. Returned to ED via EMS on 1/8 with acute respiratory distress and hypoxia in 80's, required BiPAP. CTA neg for PE, showed b/l pleural effusions and left upper abd calcified mass. Repeat thoracentesis procedures with recurrence of R pleural effusion - required VATS with pleural biopsy and chest tube by CT surgery. Pathology possible malignant renal cell cancer. D/c with OV scheduled at [facility].   08/09/2021: OV with Dr. Delton Coombes with oncology. Discussed possible tx options for metastatic disease. Plan for bone scan. Monitoring for microcytic anemia. CXR for SOB showed small right pleural effusion - no indication for thora.   08/13/2021: Today - hospital follow up Patient presents today for hospital follow up. He reports he continues to have some shortness of breath with exertion, but it has improved and mainly occurs up initial exertion and then subsides. He was seen by his oncologist on 1/30 with CXR which showed decrease in his right pneumo and small residual pleural effusion. He will see Dr. Cyndia Bent with CT surgery and follow up CXR on 2/8. He denies wheezing, cough, hemoptysis, chest pain, orthopnea, PND, or lower extremity edema. He has not had to use his albuterol inhaler since being discharged. He denies a history of SOB and states that it only started before this hospitalization. He does continue to smoke 1/3 pack a day and has no plans to quit. He had a bone scan today and is awaiting the results, and will follow up with oncology to determine his care plan moving forward.   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 He hasn't been able to work in over a month. He filed for disability on Monday and is awaiting a response. Overall from a respiratory standpoint, he feels he has improved.   No Known Allergies  Immunization History  Administered Date(s) Administered   Tdap 10/19/2010, 10/08/2020    Past Medical History:  Diagnosis Date   Alcohol use disorder, mild, abuse    Cigarette smoker    Diabetes mellitus without complication ([place])  Hyperlipidemia    Hypertension    Polycythemia 04/13/2020   Stroke ([place])    x3    Tobacco History: Social History   Tobacco Use  Smoking Status Every Day   Packs/day: 1.00   Types: Cigarettes  Smokeless Tobacco Never   Ready to quit: Not Answered Counseling given: Not  Answered   Outpatient Medications Prior to Visit  Medication Sig Dispense Refill   amLODipine (NORVASC) 10 MG tablet Take 1 tablet (10 mg total) by mouth daily. 90 tablet 1   aspirin 81 MG chewable tablet Chew 81 mg by mouth daily.     atorvastatin (LIPITOR) 40 MG tablet Take 1 tablet (40 mg total) by mouth daily. 90 tablet 1   folic acid (FOLVITE) 1 MG tablet Take 1 tablet (1 mg total) by mouth daily. 90 tablet 0   glipiZIDE (GLUCOTROL) 10 MG tablet Take 1 tablet (10 mg total) by mouth daily before breakfast. 90 tablet 1   Semaglutide (RYBELSUS) 14 MG TABS Take 14 mg by mouth daily. 90 tablet 3   albuterol (VENTOLIN HFA) 108 (90 Base) MCG/ACT inhaler Inhale 1-2 puffs into the lungs every 4 (four) hours as needed for wheezing or shortness of breath. (Patient not taking: Reported on 08/09/2021) 1 each 0   guaiFENesin (ROBITUSSIN) 100 MG/5ML liquid Take 5-10 mLs (100-200 mg total) by mouth every 4 (four) hours as needed for cough or to loosen phlegm. (Patient not taking: Reported on 08/13/2021) 60 mL 0   ibuprofen (ADVIL) 600 MG tablet Take 1 tablet (600 mg total) by mouth every 6 (six) hours as needed for mild pain, fever or headache. (Patient not taking: Reported on 08/13/2021) 30 tablet 0   losartan-hydrochlorothiazide (HYZAAR) 100-25 MG tablet Take 1 tablet by mouth daily. 90 tablet 1   methotrexate (RHEUMATREX) 2.5 MG tablet Take 10 tablets (25 mg total) by mouth once a week. (Patient not taking: Reported on 08/13/2021) 50 tablet 0   Pseudoephedrine-APAP-DM (DAYQUIL PO) Take 30 mLs by mouth 2 (two) times daily as needed (cold/cough). (Patient not taking: Reported on 08/13/2021)     No facility-administered medications prior to visit.     Review of Systems:   Constitutional: No weight loss or gain, night sweats, fevers, chills. +fatigue (unchanged) HEENT: No headaches, difficulty swallowing, tooth/dental problems, or sore throat. No sneezing, itching, ear ache, nasal congestion, or post nasal  drip CV:  No chest pain, orthopnea, PND, swelling in lower extremities, anasarca, dizziness, palpitations, syncope Resp: +shortness of breath with exertion (initial exertion; resolves after mobilizing for a minute or two which is an improvement since hospitalization). No excess mucus or change in color of mucus. No productive or non-productive. No hemoptysis. No wheezing.  No chest wall deformity GI:  No heartburn, indigestion, abdominal pain, nausea, vomiting, diarrhea, change in bowel habits, loss of appetite, bloody stools.  GU: No dysuria, change in color of urine, urgency or frequency.  No flank pain, no hematuria  Skin: No rash, lesions, ulcerations MSK:  No joint pain or swelling.  No decreased range of motion.  No back pain. Neuro: No dizziness or lightheadedness.  Psych: No depression or anxiety. Mood stable.     Physical Exam:  BP 120/90 (BP Location: Left Arm, Cuff Size: Large)    Pulse (!) 106    Temp 98.8 F (37.1 C) (Oral)    Ht 6' (1.829 m)    Wt 299 lb 12.8 oz (136 kg)    SpO2 96%    BMI 40.66 kg/m  GEN: Pleasant, interactive, well-appearing; morbidly obese; in no acute distress. HEENT:  Normocephalic and atraumatic. EACs patent bilaterally. TM pearly gray with present light reflex bilaterally. PERRLA. Sclera white. Nasal turbinates pink, moist and patent bilaterally. No rhinorrhea present. Oropharynx pink and moist, without exudate or edema. No lesions, ulcerations, or postnasal drip.  NECK:  Supple w/ fair ROM. No JVD present. Normal carotid impulses w/o bruits. Thyroid symmetrical with no goiter or nodules palpated. No lymphadenopathy.   CV: RRR, no m/r/g, no peripheral edema. Pulses intact, +2 bilaterally. No cyanosis, pallor or clubbing. PULMONARY:  Unlabored, regular breathing. Diminished bases b/l, clear otherwise A&P w/o wheezes/rales/rhonchi. No accessory muscle use. No dullness to percussion. GI: BS present and normoactive. Soft, non-tender to palpation. No  organomegaly or masses detected. No CVA tenderness. MSK: No erythema, warmth or tenderness. Cap refil <2 sec all extrem. No deformities or joint swelling noted.  Neuro: A/Ox3. No focal deficits noted.   Skin: Warm, no lesions or rashe Psych: Normal affect and behavior. Judgement and thought content appropriate.     Lab Results:  CBC    Component Value Date/Time   WBC 9.1 07/29/2021 0133   RBC 3.24 (L) 07/29/2021 0133   HGB 7.6 (L) 07/29/2021 0133   HGB 13.2 11/20/2020 0847   HCT 24.4 (L) 07/29/2021 0133   HCT 40.8 11/20/2020 0847   PLT 337 07/29/2021 0133   PLT 269 11/20/2020 0847   MCV 75.3 (L) 07/29/2021 0133   MCV 69 (L) 11/20/2020 0847   MCH 23.5 (L) 07/29/2021 0133   MCHC 31.1 07/29/2021 0133   RDW 17.5 (H) 07/29/2021 0133   RDW 18.5 (H) 11/20/2020 0847   LYMPHSABS 1.0 07/26/2021 0012   LYMPHSABS 1.5 11/20/2020 0847   MONOABS 1.8 (H) 07/26/2021 0012   EOSABS 0.0 07/26/2021 0012   EOSABS 0.1 11/20/2020 0847   BASOSABS 0.0 07/26/2021 0012   BASOSABS 0.1 11/20/2020 0847    BMET    Component Value Date/Time   NA 136 07/29/2021 0133   NA 136 11/20/2020 0847   K 3.9 07/29/2021 0133   CL 99 07/29/2021 0133   CO2 29 07/29/2021 0133   GLUCOSE 138 (H) 07/29/2021 0133   BUN 17 07/29/2021 0133   BUN 17 11/20/2020 0847   CREATININE 1.08 07/29/2021 0133   CREATININE 0.81 01/12/2021 1344   CALCIUM 8.3 (L) 07/29/2021 0133   GFRNONAA >60 07/29/2021 0133   GFRNONAA 99 01/12/2021 1344   GFRAA 115 01/12/2021 1344    BNP    Component Value Date/Time   BNP 54.1 07/22/2021 0941     Imaging:  DG Chest 2 View  Result Date: 08/09/2021 CLINICAL DATA:  Follow-up malignant pleural effusion. EXAM: CHEST - 2 VIEW COMPARISON:  07/28/2021 FINDINGS: Small right pleural effusion without significant change. Small residual right pneumothorax, with an interval mild decrease in size and partially filled in with pleural fluid. Increased linear and ill-defined density at the right lung  base. Clear left lung. Mildly enlarged cardiac silhouette with an interval decrease in size. Thoracic spine degenerative changes. IMPRESSION: 1. Interval decrease in size of a small right pneumothorax, partially filled with fluid in the interim. 2. No gross change in size of a small right pleural effusion. 3. Increased right basilar atelectasis. 4. Mild cardiomegaly with improvement. Electronically Signed   By: Claudie Revering M.D.   On: 08/09/2021 09:26   DG Chest 2 View  Result Date: 07/28/2021 CLINICAL DATA:  Chest tube removed 2 hours ago EXAM: CHEST - 2 VIEW  COMPARISON:  07/28/2021 7:28 a.m. FINDINGS: Interval removal of right chest tube. Persistent right apical pneumothorax, which appears unchanged. Persistent small right pleural effusion layering in the right lower lung, which obscures previously noted right lower lung airspace disease. No left pleural effusion. No significant pulmonary opacity in the left lung. Mild cardiomegaly. Unchanged mediastinal contours. Air in the soft tissues, likely related to the prior chest tube. No acute osseous abnormality. IMPRESSION: Interval removal of right chest tube with persistent right apical pneumothorax and small right pleural effusion. Electronically Signed   By: Merilyn Baba M.D.   On: 07/28/2021 11:10   CT Angio Chest PE W and/or Wo Contrast  Result Date: 07/19/2021 CLINICAL DATA:  Pulmonary embolism suspected, high probability. EXAM: CT ANGIOGRAPHY CHEST WITH CONTRAST TECHNIQUE: Multidetector CT imaging of the chest was performed using the standard protocol during bolus administration of intravenous contrast. Multiplanar CT image reconstructions and MIPs were obtained to evaluate the vascular anatomy. CONTRAST:  173mL OMNIPAQUE IOHEXOL 350 MG/ML SOLN COMPARISON:  None. FINDINGS: Cardiovascular: The heart is normal in size and there is no pericardial effusion. Scattered coronary artery calcifications are noted. There is atherosclerotic calcification of the  aorta without evidence of aneurysm. Pulmonary trunk is normal in caliber. No large central pulmonary artery filling defect is identified. There is suboptimal opacification and compressive atelectasis evaluation of the segmental and subsegmental arteries. Mediastinum/Nodes: No mediastinal, hilar, or axillary lymphadenopathy. A subcentimeter hypodensity is present in the left lobe of the thyroid gland which is too small to further characterize. The trachea and esophagus are within normal limits. Lungs/Pleura: There are large bilateral pleural effusion with compressive atelectasis bilaterally. No pneumothorax. Upper Abdomen: A rim calcified mass is present in the left upper quadrant inferior to the spleen, only partially visualized on exam. Musculoskeletal: Degenerative changes in the thoracic spine. No acute osseous abnormality. Review of the MIP images confirms the above findings. IMPRESSION: 1. No large central pulmonary artery embolism. There is suboptimal evaluation of the segmental subsegmental arteries. 2. Large bilateral pleural effusions with compressive atelectasis. 3. Coronary artery calcifications. 4. Aortic atherosclerosis. 5. Rim calcified mass in the left upper quadrant of unknown etiology. CT abdomen and pelvis is recommended for further evaluation. Electronically Signed   By: Brett Fairy M.D.   On: 07/19/2021 04:32   CT CHEST ABDOMEN PELVIS W CONTRAST  Result Date: 07/21/2021 CLINICAL DATA:  Anemia. Partially imaged rim calcified mass in the left upper quadrant of the abdomen on prior chest CT. EXAM: CT CHEST, ABDOMEN, AND PELVIS WITH CONTRAST TECHNIQUE: Multidetector CT imaging of the chest, abdomen and pelvis was performed following the standard protocol during bolus administration of intravenous contrast. RADIATION DOSE REDUCTION: This exam was performed according to the departmental dose-optimization program which includes automated exposure control, adjustment of the mA and/or kV according  to patient size and/or use of iterative reconstruction technique. CONTRAST:  119mL OMNIPAQUE IOHEXOL 300 MG/ML  SOLN COMPARISON:  Chest CTA 07/19/2021. No other relevant comparison studies. FINDINGS: CT CHEST FINDINGS Cardiovascular: No acute vascular findings are seen. There is suboptimal opacification of the pulmonary arteries, but no evidence of acute pulmonary embolism. Mild atherosclerosis of the aorta, great vessels and coronary arteries. The heart size is normal. There is no pericardial effusion. Mediastinum/Nodes: There are no enlarged mediastinal, hilar or axillary lymph nodes. The thyroid gland, trachea and esophagus demonstrate no significant findings. Lungs/Pleura: Significant improvement in the previously demonstrated large bilateral pleural effusions. Mild residual pleural effusions are present bilaterally. There is persistent dense consolidation in  both lower lobes, left greater than right. In addition, there is mild dependent airspace disease within the right middle lobe. The upper lobes are clear. No suspicious pulmonary nodules. Mild centrilobular emphysema. Musculoskeletal/Chest wall: No chest wall mass or suspicious osseous findings. Mild thoracic spondylosis. CT ABDOMEN AND PELVIS FINDINGS Hepatobiliary: Subjective mild hepatic steatosis without focal abnormality or abnormal enhancement. No evidence of gallstones, gallbladder wall thickening or biliary dilatation. Pancreas: Unremarkable. No pancreatic ductal dilatation or surrounding inflammatory changes. Spleen: Normal in size without focal abnormality. Adrenals/Urinary Tract: The right adrenal gland appears normal. There is splaying of the limbs of the left adrenal gland by the partially calcified left renal mass, further described below. No adrenal mass identified. There is a large peripherally calcified mass involving the upper pole of the left kidney, measuring up to 11.1 x 10.7 cm transverse on image 67/3. This measures up to 10.7 cm  craniocaudal on coronal image 115/5. In addition to peripheral calcifications, there are speckled internal calcifications within this lesion which is incompletely characterized. No other renal masses are identified. The right kidney appears normal. There is no hydronephrosis. The bladder appears unremarkable. Stomach/Bowel: Enteric contrast was administered and has passed into the mid colon. The stomach appears unremarkable for its degree of distension. No evidence of bowel wall thickening, distention or surrounding inflammatory change. The appendix appears normal. Vascular/Lymphatic: There are no enlarged abdominal or pelvic lymph nodes. Mild aortic and branch vessel atherosclerosis without acute vascular findings or aneurysm. The portal, superior mesenteric and splenic veins are patent. The renal veins and IVC appear normal. Reproductive: Mild enlargement of the prostate gland. The seminal vesicles appear unremarkable. Other: Small umbilical hernia containing only fat.  No ascites. Musculoskeletal: No acute or significant osseous findings. Multilevel spondylosis contributing to biforaminal narrowing at L4-5 and L5-S1. IMPRESSION: 1. Interval significant improvement in previously demonstrated bilateral pleural effusions. There is persistent lower lobe consolidation bilaterally suspicious for pneumonia. Continued radiographic follow-up recommended. 2. Indeterminate partially calcified mass involving the upper pole of the left kidney. This is not clearly cystic by single-phase CT and demonstrates internal calcifications. Further evaluation recommended to exclude neoplasm. Optimally, this would be evaluated with dedicated renal MRI (without and with contrast) when the patient is clinically stable and able to follow directions and hold their breath (preferably as an outpatient). If unable to tolerate MRI, dedicated renal CT (pre and post-contrast imaging) could be performed. 3. Coronary and Aortic Atherosclerosis  (ICD10-I70.0). Electronically Signed   By: Richardean Sale M.D.   On: 07/21/2021 14:42   DG Chest Port 1 View  Result Date: 07/28/2021 CLINICAL DATA:  Pleural effusion. EXAM: PORTABLE CHEST 1 VIEW COMPARISON:  Seventeen 2023 FINDINGS: Right chest tube remains in place. Possible small right apical pneumothorax has developed in the interval. Small right effusion unchanged. Right lower lobe airspace disease unchanged. Mild left lower lobe airspace disease unchanged. IMPRESSION: Right chest tube remains in place. Question small right apical pneumothorax. Small right effusion and right lower lobe airspace disease unchanged. Electronically Signed   By: Franchot Gallo M.D.   On: 07/28/2021 10:05   DG CHEST PORT 1 VIEW  Result Date: 07/27/2021 CLINICAL DATA:  Chest tube present and respiratory distress EXAM: PORTABLE CHEST 1 VIEW COMPARISON:  Yesterday FINDINGS: Right chest tube is unchanged in position. Right apical pleural thickening is new, indicative of pleural fluid. The previously described apical pneumothorax is not identified. There may be a small amount of sub pulmonic pleural air medially. Subcutaneous emphysema about the right chest wall  is mildly increased. Midline trachea. Mild cardiomegaly. Improved patchy right-sided airspace disease. Minimal left base atelectasis laterally. IMPRESSION: Right chest tube remaining in place. The apical pneumothorax has been replaced by small volume pleural fluid. There may be residual sub pulmonic small volume right-sided pleural air. Cardiomegaly with improved right-sided airspace disease. Electronically Signed   By: Abigail Miyamoto M.D.   On: 07/27/2021 08:46   DG Chest Port 1 View  Result Date: 07/26/2021 CLINICAL DATA:  Follow-up right chest tube. EXAM: PORTABLE CHEST 1 VIEW COMPARISON:  07/25/2021 FINDINGS: Stable cardiomediastinal contours right chest tube is in place and stable from previous exam. Tiny pneumothorax is identified which measures approximately 4 mm  over the apical portions of the right lung. The rib this does not appear significantly changed from previous exam. Opacities within the right midlung and right lower lung are unchanged. Left lung is clear. IMPRESSION: 1. Stable right chest tube with tiny right apical pneumothorax. 2. No change in aeration to the right lung. Electronically Signed   By: Kerby Moors M.D.   On: 07/26/2021 08:48   DG Chest Port 1 View  Result Date: 07/25/2021 CLINICAL DATA:  Postop imaging. EXAM: PORTABLE CHEST 1 VIEW COMPARISON:  07/24/2021. FINDINGS: Significant improvement in right lung aeration since the prior study. The near complete opacification of the right hemithorax has been replaced by patchy airspace opacities throughout and aerated right lung, with more confluent opacity at the right lung base. No pneumothorax. Right-sided chest tube has its tip near the right apex. IMPRESSION: 1. Significant improvement in right lung aeration consistent with evacuation of a right pleural effusion. 2. No pneumothorax.  Well-positioned right-sided chest tube. 3. Residual opacity noted throughout the right lung is consistent with atelectasis or infection. Electronically Signed   By: Lajean Manes M.D.   On: 07/25/2021 10:10   DG Chest Port 1 View  Result Date: 07/24/2021 CLINICAL DATA:  Follow-up large pleural effusion EXAM: PORTABLE CHEST 1 VIEW COMPARISON:  Chest radiograph from one day prior. FINDINGS: Stable cardiomediastinal silhouette with top-normal heart size. No pneumothorax. Large right pleural effusion nearly completely opacifying the right hemithorax, increased. No left pleural effusion. Clear left lung. IMPRESSION: Large right pleural effusion, nearly completely opacifying the right hemithorax, increased. Electronically Signed   By: Ilona Sorrel M.D.   On: 07/24/2021 08:10   DG CHEST PORT 1 VIEW  Result Date: 07/23/2021 CLINICAL DATA:  Pleural effusion. Shortness of breath. Respiratory distress. EXAM: PORTABLE CHEST  1 VIEW COMPARISON:  07/22/2021; 07/21/2021; 07/20/2021; CT of the chest, abdomen and pelvis 07/21/2021 FINDINGS: Grossly unchanged enlarged cardiac silhouette and mediastinal contours. Interval increase in now large loculated right-sided pleural effusion with associated progressive compressive atelectasis/collapse without definitive mediastinal shift. Unchanged left basilar heterogeneous consolidative opacities. No definite left-sided pleural effusion. No evidence of edema. No pneumothorax. No acute osseous abnormalities. Degenerative change of the bilateral acromioclavicular joints. Stigmata of DISH within thoracic spine. IMPRESSION: 1. Progression of now large loculated right-sided effusion with associated progressive atelectasis/collapse. Further evaluation with repeat contrast-enhanced chest CT could be performed as indicated. 2. Unchanged left basilar heterogeneous consolidative opacities again worrisome for infection or aspiration. These results will be called to the ordering clinician or representative by the Radiologist Assistant, and communication documented in the PACS or [employer]. Electronically Signed   By: Sandi Mariscal M.D.   On: 07/23/2021 07:55   DG CHEST PORT 1 VIEW  Result Date: 07/22/2021 CLINICAL DATA:  Evaluate lungs EXAM: PORTABLE CHEST 1 VIEW COMPARISON:  Chest x-ray  dated July 21, 2021 FINDINGS: Cardiac and mediastinal contours are unchanged. Moderate loculated right pleural effusion, increased in size when compared with prior exam. Bibasilar opacities. No evidence of pneumothorax. IMPRESSION: 1. Moderate loculated right pleural effusion, increased in size when compared to prior exam. 2. Bibasilar opacities, concerning for infection or aspiration. Electronically Signed   By: Yetta Owen M.D.   On: 07/22/2021 10:51   DG CHEST PORT 1 VIEW  Result Date: 07/21/2021 CLINICAL DATA:  Status post thoracentesis. EXAM: PORTABLE CHEST 1 VIEW COMPARISON:  Chest radiograph 07/21/2021  at 6:40 a.m. FINDINGS: The cardiomediastinal silhouette is unchanged with normal heart size. There is a small right pleural effusion, decreased following interval thoracentesis. A small left-sided pleural effusion remains. There are patchy bibasilar airspace opacities, unchanged on the left and with improved aeration of the right lung base following thoracentesis. No pneumothorax is identified. IMPRESSION: 1. Decreased right pleural effusion following thoracentesis. No pneumothorax. 2. Persistent small bilateral. Pleural effusions and bibasilar atelectasis or infiltrates Electronically Signed   By: Logan Bores M.D.   On: 07/21/2021 10:27   DG Chest Port 1 View  Result Date: 07/21/2021 CLINICAL DATA:  Shortness of breath, respiratory distress, pleural effusions. EXAM: PORTABLE CHEST 1 VIEW COMPARISON:  Chest radiograph dated July 20, 2020. FINDINGS: The heart is enlarged. Hazy opacity in the right lower lung likely representing large pleural effusion with associated atelectasis. Small left pleural effusion. Osseous structures are unremarkable. IMPRESSION: 1. Cardiomegaly. 2. Large right pleural effusion with associated atelectasis. Underlying airspace disease can not be excluded. Small left pleural effusion. Electronically Signed   By: Keane Police D.O.   On: 07/21/2021 08:59   DG CHEST PORT 1 VIEW  Result Date: 07/20/2021 CLINICAL DATA:  56 year old male with history of shortness of breath. Status post thoracentesis. EXAM: PORTABLE CHEST 1 VIEW COMPARISON:  Chest x-ray 07/20/2021. FINDINGS: Persistent moderate to large right pleural effusion appears slightly decreased compared to the prior study. No definite pneumothorax. Opacity in the right lung base, favored to predominantly reflect areas of passive subsegmental atelectasis although underlying airspace consolidation is not excluded. Left lung is clear. No evidence of pulmonary edema. Heart size is normal. Upper mediastinal contours are within normal  limits. IMPRESSION: 1. Slightly decreased size of moderate to large right pleural effusion following thoracentesis. No postprocedural pneumothorax. Persistent passive atelectasis in the right lung base. Electronically Signed   By: Vinnie Langton M.D.   On: 07/20/2021 12:20   DG Chest Port 1 View  Result Date: 07/20/2021 CLINICAL DATA:  Respiratory distress and pleural effusions. EXAM: PORTABLE CHEST 1 VIEW COMPARISON:  Portable chest today at 15:14, chest CT today at 02:43. FINDINGS: 4:15 a.m., 07/20/2021. The cardiac size is normal. The central vessels are borderline prominent without flow cephalization and no edema is seen. Moderate/large right and moderate left pleural effusions continue to be seen with overlying opacities consistent with atelectasis or consolidation, greater on the right. The left mid and both upper lung fields remain clear. No new or worsening lung opacity is seen. The pleural effusions seem unchanged. IMPRESSION: Right-greater-than-left pleural effusions with overlying atelectasis or consolidation. Overall aeration seems unchanged. Electronically Signed   By: Telford Nab M.D.   On: 07/20/2021 05:08   DG Chest Portable 1 View  Result Date: 07/19/2021 CLINICAL DATA:  thoracentesis EXAM: PORTABLE CHEST 1 VIEW COMPARISON:  Chest x-ray 07/19/2021. FINDINGS: The heart and mediastinal contours are unchanged. Bibasilar vague airspace opacity. No pulmonary edema. Bilateral trace to small bilateral effusions. No pneumothorax. No  acute osseous abnormality. IMPRESSION: Bilateral trace to small bilateral effusions. Associated bibasilar vague airspace opacities likely representing atelectasis. Superimposed infection/inflammation not excluded. Electronically Signed   By: Iven Finn M.D.   On: 07/19/2021 15:28   DG Chest Portable 1 View  Result Date: 07/19/2021 CLINICAL DATA:  Status post thoracentesis.  Shortness of breath. EXAM: PORTABLE CHEST 1 VIEW COMPARISON:  Chest radiograph  07/18/2021 and chest CTA 07/19/2021 FINDINGS: The cardiomediastinal silhouette is unchanged with normal heart size. There are persistent right larger than left pleural effusions with associated lower lobe atelectasis. Lung aeration is overall similar to yesterday's chest radiograph. No pneumothorax is identified. IMPRESSION: No pneumothorax. Bilateral pleural effusions and lower lobe atelectasis. Electronically Signed   By: Logan Bores M.D.   On: 07/19/2021 10:36   DG Chest Port 1 View  Result Date: 07/18/2021 CLINICAL DATA:  Shortness of breath EXAM: PORTABLE CHEST 1 VIEW COMPARISON:  07/12/2021 FINDINGS: Bilateral lower lobe airspace opacities and layering effusions. This is stable on the left, worsened on the right since prior study. Heart is borderline in size. No acute bony abnormality. IMPRESSION: Bilateral effusions with lower lobe atelectasis or infiltrates, worsening on the right since prior study. Electronically Signed   By: Rolm Baptise M.D.   On: 07/18/2021 22:14   ECHOCARDIOGRAM COMPLETE  Result Date: 07/19/2021    ECHOCARDIOGRAM REPORT   Patient Name:   PHILIPPE GANG Date of Exam: 07/19/2021 Medical Rec #:  [id number]       Height:       72.0 in Accession #:    [id number]      Weight:       314.0 lb Date of Birth:  1966/02/18       BSA:          2.581 m Patient Age:    70 years        BP:           126/89 mmHg Patient Gender: M               HR:           116 bpm. Exam Location:  Inpatient Procedure: 2D Echo, Cardiac Doppler, Color Doppler and Intracardiac            Opacification Agent Indications:    CHF  History:        Patient has no prior history of Echocardiogram examinations.                 Risk Factors:Hypertension and Diabetes.  Sonographer:    Jyl Heinz Referring Phys: [place]  1. Left ventricular ejection fraction, by estimation, is 55 to 60%. The left ventricle has normal function. The left ventricle has no regional wall motion abnormalities. There is  moderate asymmetric left ventricular hypertrophy of the basal-septal segment. Left ventricular diastolic parameters are indeterminate.  2. Right ventricular systolic function is normal. The right ventricular size is normal. Tricuspid regurgitation signal is inadequate for assessing PA pressure.  3. The mitral valve is normal in structure. No evidence of mitral valve regurgitation.  4. The aortic valve was not well visualized. Aortic valve regurgitation is not visualized. No aortic stenosis is present. FINDINGS  Left Ventricle: Left ventricular ejection fraction, by estimation, is 55 to 60%. The left ventricle has normal function. The left ventricle has no regional wall motion abnormalities. The left ventricular internal cavity size was small. There is moderate  asymmetric left ventricular hypertrophy of the basal-septal segment. Left  ventricular diastolic parameters are indeterminate. Right Ventricle: The right ventricular size is normal. Right vetricular wall thickness was not well visualized. Right ventricular systolic function is normal. Tricuspid regurgitation signal is inadequate for assessing PA pressure. Left Atrium: Left atrial size was not well visualized. Right Atrium: Right atrial size was not well visualized. Pericardium: There is no evidence of pericardial effusion. Mitral Valve: The mitral valve is normal in structure. No evidence of mitral valve regurgitation. Tricuspid Valve: The tricuspid valve is normal in structure. Tricuspid valve regurgitation is trivial. Aortic Valve: The aortic valve was not well visualized. Aortic valve regurgitation is not visualized. No aortic stenosis is present. Aortic valve peak gradient measures 5.3 mmHg. Pulmonic Valve: The pulmonic valve was not well visualized. Pulmonic valve regurgitation is not visualized. Aorta: The aortic root and ascending aorta are structurally normal, with no evidence of dilitation. IAS/Shunts: The interatrial septum was not well visualized.   LEFT VENTRICLE PLAX 2D LVIDd:         3.70 cm     Diastology LVIDs:         2.60 cm     LV e' medial:    7.62 cm/s LV PW:         1.20 cm     LV E/e' medial:  6.2 LV IVS:        1.70 cm     LV e' lateral:   7.72 cm/s LVOT diam:     2.20 cm     LV E/e' lateral: 6.2 LV SV:         34 LV SV Index:   13 LVOT Area:     3.80 cm  LV Volumes (MOD) LV vol d, MOD A2C: 89.9 ml LV vol d, MOD A4C: 67.0 ml LV vol s, MOD A2C: 40.5 ml LV vol s, MOD A4C: 28.5 ml LV SV MOD A2C:     49.4 ml LV SV MOD A4C:     67.0 ml LV SV MOD BP:      47.7 ml RIGHT VENTRICLE RV Basal diam:  3.70 cm RV Mid diam:    3.20 cm RV S prime:     16.40 cm/s TAPSE (M-mode): 1.7 cm LEFT ATRIUM         Index       RIGHT ATRIUM           Index LA diam:    2.30 cm 0.89 cm/m  RA Area:     10.80 cm                                 RA Volume:   22.60 ml  8.76 ml/m  AORTIC VALVE AV Area (Vmax): 2.61 cm AV Vmax:        115.00 cm/s AV Peak Grad:   5.3 mmHg LVOT Vmax:      79.00 cm/s LVOT Vmean:     52.100 cm/s LVOT VTI:       0.089 m  AORTA Ao Root diam: 3.30 cm Ao Asc diam:  3.30 cm MITRAL VALVE MV Area (PHT): 5.23 cm    SHUNTS MV Decel Time: 145 msec    Systemic VTI:  0.09 m MV E velocity: 47.60 cm/s  Systemic Diam: 2.20 cm MV A velocity: 72.40 cm/s MV E/A ratio:  0.66 Oswaldo Milian MD Electronically signed by Oswaldo Milian MD Signature Date/Time: 07/19/2021/4:04:25 PM    Final    CT RENAL ABD W/WO  Result Date: 07/24/2021 CLINICAL DATA:  Indeterminate left renal lesion on recent CT. EXAM: CT ABDOMEN WITHOUT AND WITH CONTRAST TECHNIQUE: Multidetector CT imaging of the abdomen was performed following the standard protocol before and following the bolus administration of intravenous contrast. RADIATION DOSE REDUCTION: This exam was performed according to the departmental dose-optimization program which includes automated exposure control, adjustment of the mA and/or kV according to patient size and/or use of iterative reconstruction technique.  CONTRAST:  40mL OMNIPAQUE IOHEXOL 350 MG/ML SOLN COMPARISON:  07/21/2021 FINDINGS: Lower chest: Increased size of large right pleural effusion with compressive atelectasis right lower lung. Decreased airspace disease in posterior left lower lobe. Persistent tiny left pleural effusion. Hepatobiliary: No hepatic masses identified. Gallbladder is unremarkable. No evidence of biliary ductal dilatation. Pancreas:  No mass or inflammatory changes. Spleen:  Within normal limits in size and appearance. Adrenals/Urinary Tract: Normal appearance of adrenal glands and right kidney. A large low-attenuation lesion is seen in the upper pole of the left kidney, which measures 10.7 x 10.6 cm. This shows thin peripheral rim calcifications, as well as scattered internal calcifications likely within internal septations. This lesion shows no evidence of internal contrast enhancement. This meets criteria for a probably benign Bosniak category 90F lesion. No evidence of hydronephrosis. Stomach/Bowel: Diverticulosis is seen involving the visualized portion of the transverse colon, without signs of diverticulitis in this region. Vascular/Lymphatic: No pathologically enlarged lymph nodes identified. No acute vascular findings. Aortic atherosclerotic calcification noted. Other:  None. Musculoskeletal:  No suspicious bone lesions identified. IMPRESSION: 10.6 cm complex cystic lesion in upper pole of left kidney, consistent with an indeterminate but probably benign Bosniak category 90F lesion. Recommend continued imaging follow-up in 6 months, preferably with abdomen MRI without and with contrast. Increased size of large right pleural effusion and compressive atelectasis. Decreased airspace disease in posterior left lower lobe. Persistent tiny left pleural effusion. Aortic Atherosclerosis (ICD10-I70.0). Electronically Signed   By: Marlaine Hind M.D.   On: 07/24/2021 15:25   VAS [place] LOWER EXTREMITY VENOUS (DVT)  Result Date: 07/23/2021  Lower  Venous DVT Study Patient Name:  DORELL GATLIN  Date of Exam:   07/23/2021 Medical Rec #: [id number]        Accession #:    [id number] Date of Birth: Jul 05, 1966        Patient Gender: M Patient Age:   63 years Exam Location:  [facility] Procedure:      VAS [place] LOWER EXTREMITY VENOUS (DVT) Referring Phys: Noemi Chapel --------------------------------------------------------------------------------  Indications: Edema.  Comparison Study: No previous exams Performing Technologist: Jody Hill RVT, RDMS  Examination Guidelines: A complete evaluation includes B-mode imaging, spectral Doppler, color Doppler, and power Doppler as needed of all accessible portions of each vessel. Bilateral testing is considered an integral part of a complete examination. Limited examinations for reoccurring indications may be performed as noted. The reflux portion of the exam is performed with the patient in reverse Trendelenburg.  +---------+---------------+---------+-----------+----------+-------------------+  RIGHT     Compressibility Phasicity Spontaneity Properties Thrombus Aging       +---------+---------------+---------+-----------+----------+-------------------+  CFV       Full            Yes       Yes                                         +---------+---------------+---------+-----------+----------+-------------------+  SFJ  Full                                                                  +---------+---------------+---------+-----------+----------+-------------------+  FV Prox   Full            Yes       Yes                                         +---------+---------------+---------+-----------+----------+-------------------+  FV Mid    Full            Yes       Yes                    Not well visualized  +---------+---------------+---------+-----------+----------+-------------------+  FV Distal Full            Yes       Yes                    Not well visualized   +---------+---------------+---------+-----------+----------+-------------------+  PFV       Full                                                                  +---------+---------------+---------+-----------+----------+-------------------+  POP       Full            Yes       Yes                                         +---------+---------------+---------+-----------+----------+-------------------+  PTV       Full                                                                  +---------+---------------+---------+-----------+----------+-------------------+  PERO      Full                                                                  +---------+---------------+---------+-----------+----------+-------------------+   +---------+---------------+---------+-----------+----------+-------------------+  LEFT      Compressibility Phasicity Spontaneity Properties Thrombus Aging       +---------+---------------+---------+-----------+----------+-------------------+  CFV       Full            Yes       Yes                                         +---------+---------------+---------+-----------+----------+-------------------+  SFJ       Full                                                                  +---------+---------------+---------+-----------+----------+-------------------+  FV Prox   Full            Yes       Yes                                         +---------+---------------+---------+-----------+----------+-------------------+  FV Mid    Full            Yes       Yes                    Not well visualized  +---------+---------------+---------+-----------+----------+-------------------+  FV Distal Full            Yes       Yes                    Not well visualized  +---------+---------------+---------+-----------+----------+-------------------+  PFV       Full                                                                  +---------+---------------+---------+-----------+----------+-------------------+   POP       Full            Yes       Yes                                         +---------+---------------+---------+-----------+----------+-------------------+  PTV       Full                                                                  +---------+---------------+---------+-----------+----------+-------------------+  PERO      Full                                                                  +---------+---------------+---------+-----------+----------+-------------------+     Summary: BILATERAL: - No evidence of deep vein thrombosis seen in the lower extremities, bilaterally. - No evidence of superficial venous thrombosis in the lower extremities, bilaterally. - RIGHT: - No cystic structure found in the popliteal fossa.  LEFT: - A cystic structure is found in the popliteal fossa.  *See table(s) above for measurements and observations. Electronically signed by Jamelle Haring on 07/23/2021  at 2:54:28 PM.    Final       No flowsheet data found.  No results found for: NITRICOXIDE    08/13/2021: Walking oximetry without desaturation or difficulties.   Assessment & Plan:   Recurrent pleural effusion Stable, small right sided on CXR from 1/30. SOB continues to improve, slowly. Residual likely r/t deconditioning and lung injury. Plans to repeat CXR on 2/8 with f/u with Dr. Cyndia Bent. Discussed return precautions. No complaint of DOE prior to this hospitalization. Walking oximetry today without desaturations.  Patient Instructions  -Continue albuterol 1-2 puffs every 4 hours as needed for shortness of breath/wheezing  Complete financial assistance application and return to your PCP or our office in [employer].   Notify if worsening shortness of breath, cough, or other respiratory symptoms develop  Follow up in 4-6 weeks with Dr. Elsworth Soho at [place] office. If symptoms do not improve or worsen, please contact office for sooner follow up or seek emergency care.    Pneumothorax Small residual R  pneumo, decreased on recent CXR 1/30. SOB stable and slowly improving. CXR scheduled for 2/8.  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 Continue follow up with Dr. Delton Coombes. Bone scan today - will determine next steps after results obtained.    Clayton Bibles, NP 08/13/2021  Pt aware and understands NP's role.

## 2021-08-13 NOTE — Assessment & Plan Note (Signed)
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 Continue follow up with Dr. Delton Coombes. Bone scan today - will determine next steps after results obtained.

## 2021-08-15 NOTE — Progress Notes (Deleted)
Office Visit Note  Patient: Derek Owen             Date of Birth: 1965-08-24           MRN: [id number]             PCP: Renee Rival, FNP Referring: Noreene Larsson, NP Visit Date: 08/16/2021   Subjective:  No chief complaint on file.   History of Present Illness: Derek Owen is a 56 y.o. male here for follow up for seropositive RA. 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  His joint pain is doing worse than our last visit.  Pretty much has had increase symptoms ever since stopping the prednisone 5 mg dose mostly involvement of the left wrist left knee and right knee to a lesser extent.  He has not seen very large changes in swelling but does have joint stiffness with some decreased mobility.  His hemoglobin A1c improved a lot with these recent changes down to 8% from previously above 11.  He also sustained a recent fall again on the left side causing increased joint pain.   Previous HPI 10/12/20 Derek Owen is a 56 y.o. male with a history of gout, T2DM, CVA here for evaluation of joint pains with elevated RBC count, CRP, ANA, and RF. His current problems started about 2 months ago with increased pain in the hand, wrist, and knees. He has not improved much with use of colchicine and had less than 1 week improvement with intraarticular steroids. He saw Dr. 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   Labs reviewed 09/2020 RF 66.2 ESR 20 Uric acid 6.1   No Rheumatology ROS completed.   PMFS History:   Patient Active Problem List   Diagnosis Date Noted   Pneumothorax 08/13/2021   Metastasis to lung ([place]) 08/13/2021   Cancer of kidney parenchyma, left ([place]) 08/09/2021   Iron deficiency anemia 07/29/2021   Morbid obesity ([place]) 07/28/2021   Acute respiratory failure with hypoxia ([place]) 07/19/2021   Recurrent pleural effusion 07/19/2021   Mass of abdomen 07/19/2021   Uncontrolled type 2 diabetes mellitus with hyperglycemia ([place]) 11/17/2020   Hypertension associated with diabetes ([place]) 11/17/2020   RBC microcytosis 10/22/2020   High risk medication use 10/13/2020   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/12/2020   Immunization due 10/08/2020   Gout 08/31/2020    Past Medical History:  Diagnosis Date   Alcohol use disorder, mild, abuse    Cigarette smoker    Diabetes mellitus without complication ([place])    Hyperlipidemia    Hypertension    Polycythemia 04/13/2020   Stroke ([place])    x3    Family History  Adopted: Yes   Past Surgical History:  Procedure Laterality Date   PLEURAL BIOPSY Right 07/25/2021   Procedure: PLEURAL BIOPSY;  Surgeon: Gaye Pollack, MD;  Location: [facility];  Service: Thoracic;  Laterality: Right;   PLEURAL EFFUSION DRAINAGE Right 07/25/2021   Procedure: DRAINAGE OF PLEURAL EFFUSION;  Surgeon: Gaye Pollack,  MD;  Location: [facility] OR;  Service: Thoracic;  Laterality: Right;   VIDEO ASSISTED THORACOSCOPY (VATS)/EMPYEMA Right 07/25/2021   Procedure: VIDEO ASSISTED THORACOSCOPY (VATS)/EMPYEMA;  Surgeon: Gaye Pollack, MD;  Location: [facility] OR;  Service: Thoracic;  Laterality: Right;   Social History   Social History Narrative   Not on file   Immunization History  Administered Date(s) Administered   Tdap 10/19/2010, 10/08/2020     Objective: Vital Signs: There were no vitals taken for this visit.   Physical Exam   Musculoskeletal Exam: ***  CDAI Exam: CDAI Score: -- Patient Global: --; Provider Global: -- Swollen: --; Tender: -- Joint  Exam 08/16/2021   No joint exam has been documented for this visit   There is currently no information documented on the homunculus. Go to the Rheumatology activity and complete the homunculus joint exam.  Investigation: No additional findings.  Imaging: DG Chest 2 View  Result Date: 08/09/2021 CLINICAL DATA:  Follow-up malignant pleural effusion. EXAM: CHEST - 2 VIEW COMPARISON:  07/28/2021 FINDINGS: Small right pleural effusion without significant change. Small residual right pneumothorax, with an interval mild decrease in size and partially filled in with pleural fluid. Increased linear and ill-defined density at the right lung base. Clear left lung. Mildly enlarged cardiac silhouette with an interval decrease in size. Thoracic spine degenerative changes. IMPRESSION: 1. Interval decrease in size of a small right pneumothorax, partially filled with fluid in the interim. 2. No gross change in size of a small right pleural effusion. 3. Increased right basilar atelectasis. 4. Mild cardiomegaly with improvement. Electronically Signed   By: Claudie Revering M.D.   On: 08/09/2021 09:26   DG Chest 2 View  Result Date: 07/28/2021 CLINICAL DATA:  Chest tube removed 2 hours ago EXAM: CHEST - 2 VIEW COMPARISON:  07/28/2021 7:28 a.m. FINDINGS: Interval removal of right chest tube. Persistent right apical pneumothorax, which appears unchanged. Persistent small right pleural effusion layering in the right lower lung, which obscures previously noted right lower lung airspace disease. No left pleural effusion. No significant pulmonary opacity in the left lung. Mild cardiomegaly. Unchanged mediastinal contours. Air in the soft tissues, likely related to the prior chest tube. No acute osseous abnormality. IMPRESSION: Interval removal of right chest tube with persistent right apical pneumothorax and small right pleural effusion. Electronically Signed   By: Merilyn Baba M.D.   On: 07/28/2021 11:10   CT Angio Chest PE W  and/or Wo Contrast  Result Date: 07/19/2021 CLINICAL DATA:  Pulmonary embolism suspected, high probability. EXAM: CT ANGIOGRAPHY CHEST WITH CONTRAST TECHNIQUE: Multidetector CT imaging of the chest was performed using the standard protocol during bolus administration of intravenous contrast. Multiplanar CT image reconstructions and MIPs were obtained to evaluate the vascular anatomy. CONTRAST:  162m OMNIPAQUE IOHEXOL 350 MG/ML SOLN COMPARISON:  None. FINDINGS: Cardiovascular: The heart is normal in size and there is no pericardial effusion. Scattered coronary artery calcifications are noted. There is atherosclerotic calcification of the aorta without evidence of aneurysm. Pulmonary trunk is normal in caliber. No large central pulmonary artery filling defect is identified. There is suboptimal opacification and compressive atelectasis evaluation of the segmental and subsegmental arteries. Mediastinum/Nodes: No mediastinal, hilar, or axillary lymphadenopathy. A subcentimeter hypodensity is present in the left lobe of the thyroid gland which is too small to further characterize. The trachea and esophagus are within normal limits. Lungs/Pleura: There are large bilateral pleural effusion with compressive atelectasis bilaterally. No pneumothorax. Upper Abdomen: A rim calcified mass is present in the  left upper quadrant inferior to the spleen, only partially visualized on exam. Musculoskeletal: Degenerative changes in the thoracic spine. No acute osseous abnormality. Review of the MIP images confirms the above findings. IMPRESSION: 1. No large central pulmonary artery embolism. There is suboptimal evaluation of the segmental subsegmental arteries. 2. Large bilateral pleural effusions with compressive atelectasis. 3. Coronary artery calcifications. 4. Aortic atherosclerosis. 5. Rim calcified mass in the left upper quadrant of unknown etiology. CT abdomen and pelvis is recommended for further evaluation. Electronically  Signed   By: Brett Fairy M.D.   On: 07/19/2021 04:32   CT CHEST ABDOMEN PELVIS W CONTRAST  Result Date: 07/21/2021 CLINICAL DATA:  Anemia. Partially imaged rim calcified mass in the left upper quadrant of the abdomen on prior chest CT. EXAM: CT CHEST, ABDOMEN, AND PELVIS WITH CONTRAST TECHNIQUE: Multidetector CT imaging of the chest, abdomen and pelvis was performed following the standard protocol during bolus administration of intravenous contrast. RADIATION DOSE REDUCTION: This exam was performed according to the departmental dose-optimization program which includes automated exposure control, adjustment of the mA and/or kV according to patient size and/or use of iterative reconstruction technique. CONTRAST:  131m OMNIPAQUE IOHEXOL 300 MG/ML  SOLN COMPARISON:  Chest CTA 07/19/2021. No other relevant comparison studies. FINDINGS: CT CHEST FINDINGS Cardiovascular: No acute vascular findings are seen. There is suboptimal opacification of the pulmonary arteries, but no evidence of acute pulmonary embolism. Mild atherosclerosis of the aorta, great vessels and coronary arteries. The heart size is normal. There is no pericardial effusion. Mediastinum/Nodes: There are no enlarged mediastinal, hilar or axillary lymph nodes. The thyroid gland, trachea and esophagus demonstrate no significant findings. Lungs/Pleura: Significant improvement in the previously demonstrated large bilateral pleural effusions. Mild residual pleural effusions are present bilaterally. There is persistent dense consolidation in both lower lobes, left greater than right. In addition, there is mild dependent airspace disease within the right middle lobe. The upper lobes are clear. No suspicious pulmonary nodules. Mild centrilobular emphysema. Musculoskeletal/Chest wall: No chest wall mass or suspicious osseous findings. Mild thoracic spondylosis. CT ABDOMEN AND PELVIS FINDINGS Hepatobiliary: Subjective mild hepatic steatosis without focal  abnormality or abnormal enhancement. No evidence of gallstones, gallbladder wall thickening or biliary dilatation. Pancreas: Unremarkable. No pancreatic ductal dilatation or surrounding inflammatory changes. Spleen: Normal in size without focal abnormality. Adrenals/Urinary Tract: The right adrenal gland appears normal. There is splaying of the limbs of the left adrenal gland by the partially calcified left renal mass, further described below. No adrenal mass identified. There is a large peripherally calcified mass involving the upper pole of the left kidney, measuring up to 11.1 x 10.7 cm transverse on image 67/3. This measures up to 10.7 cm craniocaudal on coronal image 115/5. In addition to peripheral calcifications, there are speckled internal calcifications within this lesion which is incompletely characterized. No other renal masses are identified. The right kidney appears normal. There is no hydronephrosis. The bladder appears unremarkable. Stomach/Bowel: Enteric contrast was administered and has passed into the mid colon. The stomach appears unremarkable for its degree of distension. No evidence of bowel wall thickening, distention or surrounding inflammatory change. The appendix appears normal. Vascular/Lymphatic: There are no enlarged abdominal or pelvic lymph nodes. Mild aortic and branch vessel atherosclerosis without acute vascular findings or aneurysm. The portal, superior mesenteric and splenic veins are patent. The renal veins and IVC appear normal. Reproductive: Mild enlargement of the prostate gland. The seminal vesicles appear unremarkable. Other: Small umbilical hernia containing only fat.  No ascites. Musculoskeletal: No  acute or significant osseous findings. Multilevel spondylosis contributing to biforaminal narrowing at L4-5 and L5-S1. IMPRESSION: 1. Interval significant improvement in previously demonstrated bilateral pleural effusions. There is persistent lower lobe consolidation bilaterally  suspicious for pneumonia. Continued radiographic follow-up recommended. 2. Indeterminate partially calcified mass involving the upper pole of the left kidney. This is not clearly cystic by single-phase CT and demonstrates internal calcifications. Further evaluation recommended to exclude neoplasm. Optimally, this would be evaluated with dedicated renal MRI (without and with contrast) when the patient is clinically stable and able to follow directions and hold their breath (preferably as an outpatient). If unable to tolerate MRI, dedicated renal CT (pre and post-contrast imaging) could be performed. 3. Coronary and Aortic Atherosclerosis (ICD10-I70.0). Electronically Signed   By: Richardean Sale M.D.   On: 07/21/2021 14:42   DG Chest Port 1 View  Result Date: 07/28/2021 CLINICAL DATA:  Pleural effusion. EXAM: PORTABLE CHEST 1 VIEW COMPARISON:  Seventeen 2023 FINDINGS: Right chest tube remains in place. Possible small right apical pneumothorax has developed in the interval. Small right effusion unchanged. Right lower lobe airspace disease unchanged. Mild left lower lobe airspace disease unchanged. IMPRESSION: Right chest tube remains in place. Question small right apical pneumothorax. Small right effusion and right lower lobe airspace disease unchanged. Electronically Signed   By: Franchot Gallo M.D.   On: 07/28/2021 10:05   DG CHEST PORT 1 VIEW  Result Date: 07/27/2021 CLINICAL DATA:  Chest tube present and respiratory distress EXAM: PORTABLE CHEST 1 VIEW COMPARISON:  Yesterday FINDINGS: Right chest tube is unchanged in position. Right apical pleural thickening is new, indicative of pleural fluid. The previously described apical pneumothorax is not identified. There may be a small amount of sub pulmonic pleural air medially. Subcutaneous emphysema about the right chest wall is mildly increased. Midline trachea. Mild cardiomegaly. Improved patchy right-sided airspace disease. Minimal left base atelectasis  laterally. IMPRESSION: Right chest tube remaining in place. The apical pneumothorax has been replaced by small volume pleural fluid. There may be residual sub pulmonic small volume right-sided pleural air. Cardiomegaly with improved right-sided airspace disease. Electronically Signed   By: Abigail Miyamoto M.D.   On: 07/27/2021 08:46   DG Chest Port 1 View  Result Date: 07/26/2021 CLINICAL DATA:  Follow-up right chest tube. EXAM: PORTABLE CHEST 1 VIEW COMPARISON:  07/25/2021 FINDINGS: Stable cardiomediastinal contours right chest tube is in place and stable from previous exam. Tiny pneumothorax is identified which measures approximately 4 mm over the apical portions of the right lung. The rib this does not appear significantly changed from previous exam. Opacities within the right midlung and right lower lung are unchanged. Left lung is clear. IMPRESSION: 1. Stable right chest tube with tiny right apical pneumothorax. 2. No change in aeration to the right lung. Electronically Signed   By: Kerby Moors M.D.   On: 07/26/2021 08:48   DG Chest Port 1 View  Result Date: 07/25/2021 CLINICAL DATA:  Postop imaging. EXAM: PORTABLE CHEST 1 VIEW COMPARISON:  07/24/2021. FINDINGS: Significant improvement in right lung aeration since the prior study. The near complete opacification of the right hemithorax has been replaced by patchy airspace opacities throughout and aerated right lung, with more confluent opacity at the right lung base. No pneumothorax. Right-sided chest tube has its tip near the right apex. IMPRESSION: 1. Significant improvement in right lung aeration consistent with evacuation of a right pleural effusion. 2. No pneumothorax.  Well-positioned right-sided chest tube. 3. Residual opacity noted throughout the right lung  is consistent with atelectasis or infection. Electronically Signed   By: Lajean Manes M.D.   On: 07/25/2021 10:10   DG Chest Port 1 View  Result Date: 07/24/2021 CLINICAL DATA:   Follow-up large pleural effusion EXAM: PORTABLE CHEST 1 VIEW COMPARISON:  Chest radiograph from one day prior. FINDINGS: Stable cardiomediastinal silhouette with top-normal heart size. No pneumothorax. Large right pleural effusion nearly completely opacifying the right hemithorax, increased. No left pleural effusion. Clear left lung. IMPRESSION: Large right pleural effusion, nearly completely opacifying the right hemithorax, increased. Electronically Signed   By: Ilona Sorrel M.D.   On: 07/24/2021 08:10   DG CHEST PORT 1 VIEW  Result Date: 07/23/2021 CLINICAL DATA:  Pleural effusion. Shortness of breath. Respiratory distress. EXAM: PORTABLE CHEST 1 VIEW COMPARISON:  07/22/2021; 07/21/2021; 07/20/2021; CT of the chest, abdomen and pelvis 07/21/2021 FINDINGS: Grossly unchanged enlarged cardiac silhouette and mediastinal contours. Interval increase in now large loculated right-sided pleural effusion with associated progressive compressive atelectasis/collapse without definitive mediastinal shift. Unchanged left basilar heterogeneous consolidative opacities. No definite left-sided pleural effusion. No evidence of edema. No pneumothorax. No acute osseous abnormalities. Degenerative change of the bilateral acromioclavicular joints. Stigmata of DISH within thoracic spine. IMPRESSION: 1. Progression of now large loculated right-sided effusion with associated progressive atelectasis/collapse. Further evaluation with repeat contrast-enhanced chest CT could be performed as indicated. 2. Unchanged left basilar heterogeneous consolidative opacities again worrisome for infection or aspiration. These results will be called to the ordering clinician or representative by the Radiologist Assistant, and communication documented in the PACS or [employer]. Electronically Signed   By: Sandi Mariscal M.D.   On: 07/23/2021 07:55   DG CHEST PORT 1 VIEW  Result Date: 07/22/2021 CLINICAL DATA:  Evaluate lungs EXAM: PORTABLE CHEST 1  VIEW COMPARISON:  Chest x-ray dated July 21, 2021 FINDINGS: Cardiac and mediastinal contours are unchanged. Moderate loculated right pleural effusion, increased in size when compared with prior exam. Bibasilar opacities. No evidence of pneumothorax. IMPRESSION: 1. Moderate loculated right pleural effusion, increased in size when compared to prior exam. 2. Bibasilar opacities, concerning for infection or aspiration. Electronically Signed   By: Yetta Glassman M.D.   On: 07/22/2021 10:51   DG CHEST PORT 1 VIEW  Result Date: 07/21/2021 CLINICAL DATA:  Status post thoracentesis. EXAM: PORTABLE CHEST 1 VIEW COMPARISON:  Chest radiograph 07/21/2021 at 6:40 a.m. FINDINGS: The cardiomediastinal silhouette is unchanged with normal heart size. There is a small right pleural effusion, decreased following interval thoracentesis. A small left-sided pleural effusion remains. There are patchy bibasilar airspace opacities, unchanged on the left and with improved aeration of the right lung base following thoracentesis. No pneumothorax is identified. IMPRESSION: 1. Decreased right pleural effusion following thoracentesis. No pneumothorax. 2. Persistent small bilateral. Pleural effusions and bibasilar atelectasis or infiltrates Electronically Signed   By: Logan Bores M.D.   On: 07/21/2021 10:27   DG Chest Port 1 View  Result Date: 07/21/2021 CLINICAL DATA:  Shortness of breath, respiratory distress, pleural effusions. EXAM: PORTABLE CHEST 1 VIEW COMPARISON:  Chest radiograph dated July 20, 2020. FINDINGS: The heart is enlarged. Hazy opacity in the right lower lung likely representing large pleural effusion with associated atelectasis. Small left pleural effusion. Osseous structures are unremarkable. IMPRESSION: 1. Cardiomegaly. 2. Large right pleural effusion with associated atelectasis. Underlying airspace disease can not be excluded. Small left pleural effusion. Electronically Signed   By: Keane Police D.O.   On:  07/21/2021 08:59   DG CHEST PORT 1 VIEW  Result  Date: 07/20/2021 CLINICAL DATA:  56 year old male with history of shortness of breath. Status post thoracentesis. EXAM: PORTABLE CHEST 1 VIEW COMPARISON:  Chest x-ray 07/20/2021. FINDINGS: Persistent moderate to large right pleural effusion appears slightly decreased compared to the prior study. No definite pneumothorax. Opacity in the right lung base, favored to predominantly reflect areas of passive subsegmental atelectasis although underlying airspace consolidation is not excluded. Left lung is clear. No evidence of pulmonary edema. Heart size is normal. Upper mediastinal contours are within normal limits. IMPRESSION: 1. Slightly decreased size of moderate to large right pleural effusion following thoracentesis. No postprocedural pneumothorax. Persistent passive atelectasis in the right lung base. Electronically Signed   By: Vinnie Langton M.D.   On: 07/20/2021 12:20   DG Chest Port 1 View  Result Date: 07/20/2021 CLINICAL DATA:  Respiratory distress and pleural effusions. EXAM: PORTABLE CHEST 1 VIEW COMPARISON:  Portable chest today at 15:14, chest CT today at 02:43. FINDINGS: 4:15 a.m., 07/20/2021. The cardiac size is normal. The central vessels are borderline prominent without flow cephalization and no edema is seen. Moderate/large right and moderate left pleural effusions continue to be seen with overlying opacities consistent with atelectasis or consolidation, greater on the right. The left mid and both upper lung fields remain clear. No new or worsening lung opacity is seen. The pleural effusions seem unchanged. IMPRESSION: Right-greater-than-left pleural effusions with overlying atelectasis or consolidation. Overall aeration seems unchanged. Electronically Signed   By: Telford Nab M.D.   On: 07/20/2021 05:08   DG Chest Portable 1 View  Result Date: 07/19/2021 CLINICAL DATA:  thoracentesis EXAM: PORTABLE CHEST 1 VIEW COMPARISON:  Chest x-ray  07/19/2021. FINDINGS: The heart and mediastinal contours are unchanged. Bibasilar vague airspace opacity. No pulmonary edema. Bilateral trace to small bilateral effusions. No pneumothorax. No acute osseous abnormality. IMPRESSION: Bilateral trace to small bilateral effusions. Associated bibasilar vague airspace opacities likely representing atelectasis. Superimposed infection/inflammation not excluded. Electronically Signed   By: Iven Finn M.D.   On: 07/19/2021 15:28   DG Chest Portable 1 View  Result Date: 07/19/2021 CLINICAL DATA:  Status post thoracentesis.  Shortness of breath. EXAM: PORTABLE CHEST 1 VIEW COMPARISON:  Chest radiograph 07/18/2021 and chest CTA 07/19/2021 FINDINGS: The cardiomediastinal silhouette is unchanged with normal heart size. There are persistent right larger than left pleural effusions with associated lower lobe atelectasis. Lung aeration is overall similar to yesterday's chest radiograph. No pneumothorax is identified. IMPRESSION: No pneumothorax. Bilateral pleural effusions and lower lobe atelectasis. Electronically Signed   By: Logan Bores M.D.   On: 07/19/2021 10:36   DG Chest Port 1 View  Result Date: 07/18/2021 CLINICAL DATA:  Shortness of breath EXAM: PORTABLE CHEST 1 VIEW COMPARISON:  07/12/2021 FINDINGS: Bilateral lower lobe airspace opacities and layering effusions. This is stable on the left, worsened on the right since prior study. Heart is borderline in size. No acute bony abnormality. IMPRESSION: Bilateral effusions with lower lobe atelectasis or infiltrates, worsening on the right since prior study. Electronically Signed   By: Rolm Baptise M.D.   On: 07/18/2021 22:14   ECHOCARDIOGRAM COMPLETE  Result Date: 07/19/2021    ECHOCARDIOGRAM REPORT   Patient Name:   RAWLINS STUARD Date of Exam: 07/19/2021 Medical Rec #:  [id number]       Height:       72.0 in Accession #:    [id number]      Weight:       314.0 lb Date of Birth:  [birth year]  BSA:          2.581 m  Patient Age:    89 years        BP:           126/89 mmHg Patient Gender: M               HR:           116 bpm. Exam Location:  Inpatient Procedure: 2D Echo, Cardiac Doppler, Color Doppler and Intracardiac            Opacification Agent Indications:    CHF  History:        Patient has no prior history of Echocardiogram examinations.                 Risk Factors:Hypertension and Diabetes.  Sonographer:    Jyl Heinz Referring Phys: [place]  1. Left ventricular ejection fraction, by estimation, is 55 to 60%. The left ventricle has normal function. The left ventricle has no regional wall motion abnormalities. There is moderate asymmetric left ventricular hypertrophy of the basal-septal segment. Left ventricular diastolic parameters are indeterminate.  2. Right ventricular systolic function is normal. The right ventricular size is normal. Tricuspid regurgitation signal is inadequate for assessing PA pressure.  3. The mitral valve is normal in structure. No evidence of mitral valve regurgitation.  4. The aortic valve was not well visualized. Aortic valve regurgitation is not visualized. No aortic stenosis is present. FINDINGS  Left Ventricle: Left ventricular ejection fraction, by estimation, is 55 to 60%. The left ventricle has normal function. The left ventricle has no regional wall motion abnormalities. The left ventricular internal cavity size was small. There is moderate  asymmetric left ventricular hypertrophy of the basal-septal segment. Left ventricular diastolic parameters are indeterminate. Right Ventricle: The right ventricular size is normal. Right vetricular wall thickness was not well visualized. Right ventricular systolic function is normal. Tricuspid regurgitation signal is inadequate for assessing PA pressure. Left Atrium: Left atrial size was not well visualized. Right Atrium: Right atrial size was not well visualized. Pericardium: There is no evidence of pericardial  effusion. Mitral Valve: The mitral valve is normal in structure. No evidence of mitral valve regurgitation. Tricuspid Valve: The tricuspid valve is normal in structure. Tricuspid valve regurgitation is trivial. Aortic Valve: The aortic valve was not well visualized. Aortic valve regurgitation is not visualized. No aortic stenosis is present. Aortic valve peak gradient measures 5.3 mmHg. Pulmonic Valve: The pulmonic valve was not well visualized. Pulmonic valve regurgitation is not visualized. Aorta: The aortic root and ascending aorta are structurally normal, with no evidence of dilitation. IAS/Shunts: The interatrial septum was not well visualized.  LEFT VENTRICLE PLAX 2D LVIDd:         3.70 cm     Diastology LVIDs:         2.60 cm     LV e' medial:    7.62 cm/s LV PW:         1.20 cm     LV E/e' medial:  6.2 LV IVS:        1.70 cm     LV e' lateral:   7.72 cm/s LVOT diam:     2.20 cm     LV E/e' lateral: 6.2 LV SV:         34 LV SV Index:   13 LVOT Area:     3.80 cm  LV Volumes (MOD) LV vol d, MOD A2C: 89.9 ml  LV vol d, MOD A4C: 67.0 ml LV vol s, MOD A2C: 40.5 ml LV vol s, MOD A4C: 28.5 ml LV SV MOD A2C:     49.4 ml LV SV MOD A4C:     67.0 ml LV SV MOD BP:      47.7 ml RIGHT VENTRICLE RV Basal diam:  3.70 cm RV Mid diam:    3.20 cm RV S prime:     16.40 cm/s TAPSE (M-mode): 1.7 cm LEFT ATRIUM         Index       RIGHT ATRIUM           Index LA diam:    2.30 cm 0.89 cm/m  RA Area:     10.80 cm                                 RA Volume:   22.60 ml  8.76 ml/m  AORTIC VALVE AV Area (Vmax): 2.61 cm AV Vmax:        115.00 cm/s AV Peak Grad:   5.3 mmHg LVOT Vmax:      79.00 cm/s LVOT Vmean:     52.100 cm/s LVOT VTI:       0.089 m  AORTA Ao Root diam: 3.30 cm Ao Asc diam:  3.30 cm MITRAL VALVE MV Area (PHT): 5.23 cm    SHUNTS MV Decel Time: 145 msec    Systemic VTI:  0.09 m MV E velocity: 47.60 cm/s  Systemic Diam: 2.20 cm MV A velocity: 72.40 cm/s MV E/A ratio:  0.66 Oswaldo Milian MD Electronically signed by  Oswaldo Milian MD Signature Date/Time: 07/19/2021/4:04:25 PM    Final    CT RENAL ABD W/WO  Result Date: 07/24/2021 CLINICAL DATA:  Indeterminate left renal lesion on recent CT. EXAM: CT ABDOMEN WITHOUT AND WITH CONTRAST TECHNIQUE: Multidetector CT imaging of the abdomen was performed following the standard protocol before and following the bolus administration of intravenous contrast. RADIATION DOSE REDUCTION: This exam was performed according to the departmental dose-optimization program which includes automated exposure control, adjustment of the mA and/or kV according to patient size and/or use of iterative reconstruction technique. CONTRAST:  52m OMNIPAQUE IOHEXOL 350 MG/ML SOLN COMPARISON:  07/21/2021 FINDINGS: Lower chest: Increased size of large right pleural effusion with compressive atelectasis right lower lung. Decreased airspace disease in posterior left lower lobe. Persistent tiny left pleural effusion. Hepatobiliary: No hepatic masses identified. Gallbladder is unremarkable. No evidence of biliary ductal dilatation. Pancreas:  No mass or inflammatory changes. Spleen:  Within normal limits in size and appearance. Adrenals/Urinary Tract: Normal appearance of adrenal glands and right kidney. A large low-attenuation lesion is seen in the upper pole of the left kidney, which measures 10.7 x 10.6 cm. This shows thin peripheral rim calcifications, as well as scattered internal calcifications likely within internal septations. This lesion shows no evidence of internal contrast enhancement. This meets criteria for a probably benign Bosniak category 88F lesion. No evidence of hydronephrosis. Stomach/Bowel: Diverticulosis is seen involving the visualized portion of the transverse colon, without signs of diverticulitis in this region. Vascular/Lymphatic: No pathologically enlarged lymph nodes identified. No acute vascular findings. Aortic atherosclerotic calcification noted. Other:  None. Musculoskeletal:   No suspicious bone lesions identified. IMPRESSION: 10.6 cm complex cystic lesion in upper pole of left kidney, consistent with an indeterminate but probably benign Bosniak category 88F lesion. Recommend continued imaging follow-up in 6 months, preferably with abdomen  MRI without and with contrast. Increased size of large right pleural effusion and compressive atelectasis. Decreased airspace disease in posterior left lower lobe. Persistent tiny left pleural effusion. Aortic Atherosclerosis (ICD10-I70.0). Electronically Signed   By: Marlaine Hind M.D.   On: 07/24/2021 15:25   VAS [place] LOWER EXTREMITY VENOUS (DVT)  Result Date: 07/23/2021  Lower Venous DVT Study Patient Name:  DORAN NESTLE  Date of Exam:   07/23/2021 Medical Rec #: [id number]        Accession #:    [id number] Date of Birth: 1965-10-09        Patient Gender: M Patient Age:   78 years Exam Location:  [facility] Procedure:      VAS [place] LOWER EXTREMITY VENOUS (DVT) Referring Phys: Noemi Chapel --------------------------------------------------------------------------------  Indications: Edema.  Comparison Study: No previous exams Performing Technologist: Jody Hill RVT, RDMS  Examination Guidelines: A complete evaluation includes B-mode imaging, spectral Doppler, color Doppler, and power Doppler as needed of all accessible portions of each vessel. Bilateral testing is considered an integral part of a complete examination. Limited examinations for reoccurring indications may be performed as noted. The reflux portion of the exam is performed with the patient in reverse Trendelenburg.  +---------+---------------+---------+-----------+----------+-------------------+  RIGHT     Compressibility Phasicity Spontaneity Properties Thrombus Aging       +---------+---------------+---------+-----------+----------+-------------------+  CFV       Full            Yes       Yes                                          +---------+---------------+---------+-----------+----------+-------------------+  SFJ       Full                                                                  +---------+---------------+---------+-----------+----------+-------------------+  FV Prox   Full            Yes       Yes                                         +---------+---------------+---------+-----------+----------+-------------------+  FV Mid    Full            Yes       Yes                    Not well visualized  +---------+---------------+---------+-----------+----------+-------------------+  FV Distal Full            Yes       Yes                    Not well visualized  +---------+---------------+---------+-----------+----------+-------------------+  PFV       Full                                                                  +---------+---------------+---------+-----------+----------+-------------------+  POP       Full            Yes       Yes                                         +---------+---------------+---------+-----------+----------+-------------------+  PTV       Full                                                                  +---------+---------------+---------+-----------+----------+-------------------+  PERO      Full                                                                  +---------+---------------+---------+-----------+----------+-------------------+   +---------+---------------+---------+-----------+----------+-------------------+  LEFT      Compressibility Phasicity Spontaneity Properties Thrombus Aging       +---------+---------------+---------+-----------+----------+-------------------+  CFV       Full            Yes       Yes                                         +---------+---------------+---------+-----------+----------+-------------------+  SFJ       Full                                                                  +---------+---------------+---------+-----------+----------+-------------------+  FV  Prox   Full            Yes       Yes                                         +---------+---------------+---------+-----------+----------+-------------------+  FV Mid    Full            Yes       Yes                    Not well visualized  +---------+---------------+---------+-----------+----------+-------------------+  FV Distal Full            Yes       Yes                    Not well visualized  +---------+---------------+---------+-----------+----------+-------------------+  PFV       Full                                                                  +---------+---------------+---------+-----------+----------+-------------------+  POP       Full            Yes       Yes                                         +---------+---------------+---------+-----------+----------+-------------------+  PTV       Full                                                                  +---------+---------------+---------+-----------+----------+-------------------+  PERO      Full                                                                  +---------+---------------+---------+-----------+----------+-------------------+     Summary: BILATERAL: - No evidence of deep vein thrombosis seen in the lower extremities, bilaterally. - No evidence of superficial venous thrombosis in the lower extremities, bilaterally. - RIGHT: - No cystic structure found in the popliteal fossa.  LEFT: - A cystic structure is found in the popliteal fossa.  *See table(s) above for measurements and observations. Electronically signed by Jamelle Haring on 07/23/2021 at 2:54:28 PM.    Final     Recent Labs: Lab Results  Component Value Date   WBC 9.1 07/29/2021   HGB 7.6 (L) 07/29/2021   PLT 337 07/29/2021   NA 136 07/29/2021   K 3.9 07/29/2021   CL 99 07/29/2021   CO2 29 07/29/2021   GLUCOSE 138 (H) 07/29/2021   BUN 17 07/29/2021   CREATININE 1.08 07/29/2021   BILITOT 1.1 07/18/2021   ALKPHOS 78 07/18/2021   AST 22 07/18/2021   ALT 18  07/18/2021   PROT 6.4 (L) 07/19/2021   ALBUMIN 3.4 (L) 07/18/2021   CALCIUM 8.3 (L) 07/29/2021   GFRAA 115 01/12/2021   QFTBGOLDPLUS Indeterminate (A) 07/20/2021    Speciality Comments: No specialty comments available.  Procedures:  No procedures performed Allergies: Patient has no known allergies.   Assessment / Plan:     Visit Diagnoses: No diagnosis found.  ***  Orders: No orders of the defined types were placed in this encounter.  No orders of the defined types were placed in this encounter.    Follow-Up Instructions: No follow-ups on file.   Collier Salina, MD  Note - This record has been created using [employer].  Chart creation errors have been sought, but may not always  have been located. Such creation errors do not reflect on  the standard of medical care.

## 2021-08-16 ENCOUNTER — Ambulatory Visit: Payer: Managed Care, Other (non HMO) | Admitting: Internal Medicine

## 2021-08-17 ENCOUNTER — Other Ambulatory Visit (HOSPITAL_COMMUNITY): Payer: Managed Care, Other (non HMO)

## 2021-08-17 ENCOUNTER — Telehealth (HOSPITAL_COMMUNITY): Payer: Self-pay | Admitting: Pharmacy Technician

## 2021-08-17 ENCOUNTER — Other Ambulatory Visit: Payer: Self-pay | Admitting: Surgery

## 2021-08-17 ENCOUNTER — Other Ambulatory Visit (HOSPITAL_COMMUNITY): Payer: Self-pay

## 2021-08-17 ENCOUNTER — Inpatient Hospital Stay (HOSPITAL_COMMUNITY): Payer: Managed Care, Other (non HMO) | Attending: Hematology | Admitting: Hematology

## 2021-08-17 ENCOUNTER — Telehealth (HOSPITAL_COMMUNITY): Payer: Self-pay | Admitting: Pharmacist

## 2021-08-17 VITALS — BP 153/87 | HR 109 | Temp 98.7°F | Resp 18 | Ht 72.0 in | Wt 300.3 lb

## 2021-08-17 DIAGNOSIS — J90 Pleural effusion, not elsewhere classified: Secondary | ICD-10-CM

## 2021-08-17 DIAGNOSIS — F17210 Nicotine dependence, cigarettes, uncomplicated: Secondary | ICD-10-CM | POA: Diagnosis not present

## 2021-08-17 DIAGNOSIS — M069 Rheumatoid arthritis, unspecified: Secondary | ICD-10-CM | POA: Diagnosis not present

## 2021-08-17 DIAGNOSIS — R0602 Shortness of breath: Secondary | ICD-10-CM | POA: Insufficient documentation

## 2021-08-17 DIAGNOSIS — C642 Malignant neoplasm of left kidney, except renal pelvis: Secondary | ICD-10-CM | POA: Diagnosis present

## 2021-08-17 DIAGNOSIS — C782 Secondary malignant neoplasm of pleura: Secondary | ICD-10-CM | POA: Diagnosis not present

## 2021-08-17 DIAGNOSIS — D649 Anemia, unspecified: Secondary | ICD-10-CM | POA: Diagnosis not present

## 2021-08-17 DIAGNOSIS — J939 Pneumothorax, unspecified: Secondary | ICD-10-CM

## 2021-08-17 MED ORDER — CABOMETYX 20 MG PO TABS
40.0000 mg | ORAL_TABLET | Freq: Every day | ORAL | 2 refills | Status: DC
  Filled 2021-08-17: qty 60, 30d supply, fill #0

## 2021-08-17 NOTE — Progress Notes (Addendum)
[address] [place], [place] [address]   CLINIC:  Medical Oncology/Hematology  PCP:  Renee Rival, FNP [address] 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    CANCER STAGING:  Cancer Staging  Cancer of kidney parenchyma, left (HCC) Staging form: Kidney, AJCC 8th Edition - Clinical stage from 08/09/2021: Stage IV (cT2b, cN0, pM1) - Unsigned   INTERVAL HISTORY:  Derek Owen, a 56 y.o. 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 Derek Owen was last seen on 08/09/2021.   Today he reports feeling well. He reports occasional pain in his thighs and SOB.   REVIEW OF SYSTEMS:  Review of Systems  Constitutional:  Positive for fatigue. Negative for appetite change.  Respiratory:  Positive for shortness of breath.   All other systems reviewed and are negative.  PAST MEDICAL/SURGICAL HISTORY:  Past Medical History:  Diagnosis Date   Alcohol use disorder, mild, abuse    Cigarette smoker    Diabetes mellitus without complication ([place])    Hyperlipidemia    Hypertension    Polycythemia 04/13/2020   Stroke [facility])    x3   Past Surgical History:  Procedure Laterality Date   PLEURAL BIOPSY Right 07/25/2021   Procedure: PLEURAL BIOPSY;  Surgeon: Gaye Pollack, MD;  Location: [facility] OR;  Service: Thoracic;  Laterality: Right;   PLEURAL EFFUSION DRAINAGE Right 07/25/2021   Procedure: DRAINAGE OF PLEURAL EFFUSION;  Surgeon: Gaye Pollack, MD;  Location: [facility];  Service: Thoracic;  Laterality: Right;   VIDEO ASSISTED THORACOSCOPY (VATS)/EMPYEMA Right 07/25/2021   Procedure: VIDEO ASSISTED THORACOSCOPY (VATS)/EMPYEMA;  Surgeon: Gaye Pollack, MD;  Location: [facility] OR;  Service:  Thoracic;  Laterality: Right;    SOCIAL HISTORY:  Social History   Socioeconomic History   Marital status: Single    Spouse name: Not on file   Number of children: Not on file   Years of education: Not on file   Highest education level: Not on file  Occupational History   Not on file  Tobacco Use   Smoking status: Every Day    Packs/day: 1.00    Types: Cigarettes   Smokeless tobacco: Never  Vaping Use   Vaping Use: Never used  Substance and Sexual Activity   Alcohol use: Yes    Comment: Occasional   Drug use: Not Currently   Sexual activity: Not Currently    Birth control/protection: Other-see comments  Other Topics Concern   Not on file  Social History Narrative   Not on file   Social Determinants of Health   Financial Resource Strain: Not on file  Food Insecurity: Not on file  Transportation Needs: Not on file  Physical Activity: Not on file  Stress: Not on file  Social Connections: Not on file  Intimate Partner Violence: Not on file    FAMILY HISTORY:  Family History  Adopted: Yes    CURRENT MEDICATIONS:  Current Outpatient Medications  Medication Sig Dispense Refill   albuterol (VENTOLIN HFA) 108 (90 Base) MCG/ACT inhaler Inhale 1-2 puffs into the lungs every 4 (four) hours as needed for wheezing or shortness of breath. 1 each 0   amLODipine (NORVASC) 10 MG tablet Take 1 tablet (  10 mg total) by mouth daily. 90 tablet 1   aspirin 81 MG chewable tablet Chew 81 mg by mouth daily.     atorvastatin (LIPITOR) 40 MG tablet Take 1 tablet (40 mg total) by mouth daily. 90 tablet 1   cabozantinib (CABOMETYX) 20 MG tablet Take 2 tablets (40 mg total) by mouth daily. Take on an empty stomach, 1 hour before or 2 hours after meals. 60 tablet 2   folic acid (FOLVITE) 1 MG tablet Take 1 tablet (1 mg total) by mouth daily. 90 tablet 0   glipiZIDE (GLUCOTROL) 10 MG tablet Take 1 tablet (10 mg total) by mouth daily before breakfast. 90 tablet 1   losartan-hydrochlorothiazide  (HYZAAR) 100-25 MG tablet Take 1 tablet by mouth daily. 90 tablet 1   methotrexate (RHEUMATREX) 2.5 MG tablet Take 10 tablets (25 mg total) by mouth once a week. 50 tablet 0   Pseudoephedrine-APAP-DM (DAYQUIL PO) Take 30 mLs by mouth 2 (two) times daily as needed (cold/cough).     Semaglutide (RYBELSUS) 14 MG TABS Take 14 mg by mouth daily. 90 tablet 3   No current facility-administered medications for this visit.    ALLERGIES:  No Known Allergies  PHYSICAL EXAM:  Performance status (ECOG): 0 - Asymptomatic  Vitals:   08/17/21 1311  BP: (!) 153/87  Pulse: (!) 109  Resp: 18  Temp: 98.7 F (37.1 C)  SpO2: 98%   Wt Readings from Last 3 Encounters:  08/17/21 (!) 300 lb 4.8 oz (136.2 kg)  08/13/21 299 lb 12.8 oz (136 kg)  08/09/21 (!) 303 lb 9.6 oz (137.7 kg)   Physical Exam Vitals reviewed.  Constitutional:      Appearance: Normal appearance. He is obese.  Cardiovascular:     Rate and Rhythm: Normal rate and regular rhythm.     Pulses: Normal pulses.     Heart sounds: Normal heart sounds.  Pulmonary:     Effort: Pulmonary effort is normal.     Breath sounds: Normal breath sounds.  Neurological:     General: No focal deficit present.     Mental Status: He is alert and oriented to person, place, and time.  Psychiatric:        Mood and Affect: Mood normal.        Behavior: Behavior normal.     LABORATORY DATA:  I have reviewed the labs as listed.  CBC Latest Ref Rng & Units 07/29/2021 07/28/2021 07/26/2021  WBC 4.0 - 10.5 K/uL 9.1 9.6 17.4(H)  Hemoglobin 13.0 - 17.0 g/dL 7.6(L) 7.5(L) 8.7(L)  Hematocrit 39.0 - 52.0 % 24.4(L) 25.3(L) 27.7(L)  Platelets 150 - 400 K/uL 337 342 363   CMP Latest Ref Rng & Units 07/29/2021 07/28/2021 07/27/2021  Glucose 70 - 99 mg/dL 138(H) 189(H) 93  BUN 6 - 20 mg/dL 17 17 21(H)  Creatinine 0.61 - 1.24 mg/dL 1.08 0.99 1.03  Sodium 135 - 145 mmol/L 136 134(L) 138  Potassium 3.5 - 5.1 mmol/L 3.9 4.3 4.9  Chloride 98 - 111 mmol/L 99 96(L)  93(L)  CO2 22 - 32 mmol/L 29 30 28   Calcium 8.9 - 10.3 mg/dL 8.3(L) 8.3(L) 9.0  Total Protein 6.5 - 8.1 g/dL - - -  Total Bilirubin 0.3 - 1.2 mg/dL - - -  Alkaline Phos 38 - 126 U/L - - -  AST 15 - 41 U/L - - -  ALT 0 - 44 U/L - - -    DIAGNOSTIC IMAGING:  I have independently reviewed the scans  and discussed with the patient. DG Chest 2 View  Result Date: 08/09/2021 CLINICAL DATA:  Follow-up malignant pleural effusion. EXAM: CHEST - 2 VIEW COMPARISON:  07/28/2021 FINDINGS: Small right pleural effusion without significant change. Small residual right pneumothorax, with an interval mild decrease in size and partially filled in with pleural fluid. Increased linear and ill-defined density at the right lung base. Clear left lung. Mildly enlarged cardiac silhouette with an interval decrease in size. Thoracic spine degenerative changes. IMPRESSION: 1. Interval decrease in size of a small right pneumothorax, partially filled with fluid in the interim. 2. No gross change in size of a small right pleural effusion. 3. Increased right basilar atelectasis. 4. Mild cardiomegaly with improvement. Electronically Signed   By: Claudie Revering M.D.   On: 08/09/2021 09:26   DG Chest 2 View  Result Date: 07/28/2021 CLINICAL DATA:  Chest tube removed 2 hours ago EXAM: CHEST - 2 VIEW COMPARISON:  07/28/2021 7:28 a.m. FINDINGS: Interval removal of right chest tube. Persistent right apical pneumothorax, which appears unchanged. Persistent small right pleural effusion layering in the right lower lung, which obscures previously noted right lower lung airspace disease. No left pleural effusion. No significant pulmonary opacity in the left lung. Mild cardiomegaly. Unchanged mediastinal contours. Air in the soft tissues, likely related to the prior chest tube. No acute osseous abnormality. IMPRESSION: Interval removal of right chest tube with persistent right apical pneumothorax and small right pleural effusion. Electronically  Signed   By: Merilyn Baba M.D.   On: 07/28/2021 11:10   CT Angio Chest PE W and/or Wo Contrast  Result Date: 07/19/2021 CLINICAL DATA:  Pulmonary embolism suspected, high probability. EXAM: CT ANGIOGRAPHY CHEST WITH CONTRAST TECHNIQUE: Multidetector CT imaging of the chest was performed using the standard protocol during bolus administration of intravenous contrast. Multiplanar CT image reconstructions and MIPs were obtained to evaluate the vascular anatomy. CONTRAST:  158mL OMNIPAQUE IOHEXOL 350 MG/ML SOLN COMPARISON:  None. FINDINGS: Cardiovascular: The heart is normal in size and there is no pericardial effusion. Scattered coronary artery calcifications are noted. There is atherosclerotic calcification of the aorta without evidence of aneurysm. Pulmonary trunk is normal in caliber. No large central pulmonary artery filling defect is identified. There is suboptimal opacification and compressive atelectasis evaluation of the segmental and subsegmental arteries. Mediastinum/Nodes: No mediastinal, hilar, or axillary lymphadenopathy. A subcentimeter hypodensity is present in the left lobe of the thyroid gland which is too small to further characterize. The trachea and esophagus are within normal limits. Lungs/Pleura: There are large bilateral pleural effusion with compressive atelectasis bilaterally. No pneumothorax. Upper Abdomen: A rim calcified mass is present in the left upper quadrant inferior to the spleen, only partially visualized on exam. Musculoskeletal: Degenerative changes in the thoracic spine. No acute osseous abnormality. Review of the MIP images confirms the above findings. IMPRESSION: 1. No large central pulmonary artery embolism. There is suboptimal evaluation of the segmental subsegmental arteries. 2. Large bilateral pleural effusions with compressive atelectasis. 3. Coronary artery calcifications. 4. Aortic atherosclerosis. 5. Rim calcified mass in the left upper quadrant of unknown etiology.  CT abdomen and pelvis is recommended for further evaluation. Electronically Signed   By: Brett Fairy M.D.   On: 07/19/2021 04:32   NM Bone Scan Whole Body  Result Date: 08/15/2021 CLINICAL DATA:  Renal cancer staging. EXAM: NUCLEAR MEDICINE WHOLE BODY BONE SCAN TECHNIQUE: Whole body anterior and posterior images were obtained approximately 3 hours after intravenous injection of radiopharmaceutical. RADIOPHARMACEUTICALS:  20.1 mCi Technetium-86m MDP IV  COMPARISON:  CT scan of the chest July 21, 2021 FINDINGS: Degenerative changes in the feet, knees, AC joints, wrists, and thoracic spine. Mild uptake in multiple adjacent medial right ribs posteriorly correlates with degenerative changes at multiple costovertebral junctions. A single focus of uptake is seen in a posterior right rib. There is subtle sclerosis in this rib on series 4, image 102 of the comparison CT. The uptake and subtle sclerosis likely correlate. No other abnormal bony uptake. Soft tissue uptake is unremarkable. IMPRESSION: 1. There is a single focus of indeterminate uptake in a right posterior rib thought to correlate with subtle sclerosis in the right posterior ninth rib on recent CT imaging of the chest. The finding is not specific. An early metastatic lesion is not excluded. Recommend attention on follow-up. 2. Degenerative changes as above. Electronically Signed   By: Dorise Bullion III M.D.   On: 08/15/2021 15:42   CT CHEST ABDOMEN PELVIS W CONTRAST  Result Date: 07/21/2021 CLINICAL DATA:  Anemia. Partially imaged rim calcified mass in the left upper quadrant of the abdomen on prior chest CT. EXAM: CT CHEST, ABDOMEN, AND PELVIS WITH CONTRAST TECHNIQUE: Multidetector CT imaging of the chest, abdomen and pelvis was performed following the standard protocol during bolus administration of intravenous contrast. RADIATION DOSE REDUCTION: This exam was performed according to the departmental dose-optimization program which includes  automated exposure control, adjustment of the mA and/or kV according to patient size and/or use of iterative reconstruction technique. CONTRAST:  152mL OMNIPAQUE IOHEXOL 300 MG/ML  SOLN COMPARISON:  Chest CTA 07/19/2021. No other relevant comparison studies. FINDINGS: CT CHEST FINDINGS Cardiovascular: No acute vascular findings are seen. There is suboptimal opacification of the pulmonary arteries, but no evidence of acute pulmonary embolism. Mild atherosclerosis of the aorta, great vessels and coronary arteries. The heart size is normal. There is no pericardial effusion. Mediastinum/Nodes: There are no enlarged mediastinal, hilar or axillary lymph nodes. The thyroid gland, trachea and esophagus demonstrate no significant findings. Lungs/Pleura: Significant improvement in the previously demonstrated large bilateral pleural effusions. Mild residual pleural effusions are present bilaterally. There is persistent dense consolidation in both lower lobes, left greater than right. In addition, there is mild dependent airspace disease within the right middle lobe. The upper lobes are clear. No suspicious pulmonary nodules. Mild centrilobular emphysema. Musculoskeletal/Chest wall: No chest wall mass or suspicious osseous findings. Mild thoracic spondylosis. CT ABDOMEN AND PELVIS FINDINGS Hepatobiliary: Subjective mild hepatic steatosis without focal abnormality or abnormal enhancement. No evidence of gallstones, gallbladder wall thickening or biliary dilatation. Pancreas: Unremarkable. No pancreatic ductal dilatation or surrounding inflammatory changes. Spleen: Normal in size without focal abnormality. Adrenals/Urinary Tract: The right adrenal gland appears normal. There is splaying of the limbs of the left adrenal gland by the partially calcified left renal mass, further described below. No adrenal mass identified. There is a large peripherally calcified mass involving the upper pole of the left kidney, measuring up to 11.1  x 10.7 cm transverse on image 67/3. This measures up to 10.7 cm craniocaudal on coronal image 115/5. In addition to peripheral calcifications, there are speckled internal calcifications within this lesion which is incompletely characterized. No other renal masses are identified. The right kidney appears normal. There is no hydronephrosis. The bladder appears unremarkable. Stomach/Bowel: Enteric contrast was administered and has passed into the mid colon. The stomach appears unremarkable for its degree of distension. No evidence of bowel wall thickening, distention or surrounding inflammatory change. The appendix appears normal. Vascular/Lymphatic: There are no enlarged abdominal  or pelvic lymph nodes. Mild aortic and branch vessel atherosclerosis without acute vascular findings or aneurysm. The portal, superior mesenteric and splenic veins are patent. The renal veins and IVC appear normal. Reproductive: Mild enlargement of the prostate gland. The seminal vesicles appear unremarkable. Other: Small umbilical hernia containing only fat.  No ascites. Musculoskeletal: No acute or significant osseous findings. Multilevel spondylosis contributing to biforaminal narrowing at L4-5 and L5-S1. IMPRESSION: 1. Interval significant improvement in previously demonstrated bilateral pleural effusions. There is persistent lower lobe consolidation bilaterally suspicious for pneumonia. Continued radiographic follow-up recommended. 2. Indeterminate partially calcified mass involving the upper pole of the left kidney. This is not clearly cystic by single-phase CT and demonstrates internal calcifications. Further evaluation recommended to exclude neoplasm. Optimally, this would be evaluated with dedicated renal MRI (without and with contrast) when the patient is clinically stable and able to follow directions and hold their breath (preferably as an outpatient). If unable to tolerate MRI, dedicated renal CT (pre and post-contrast imaging)  could be performed. 3. Coronary and Aortic Atherosclerosis (ICD10-I70.0). Electronically Signed   By: Richardean Sale M.D.   On: 07/21/2021 14:42   DG Chest Port 1 View  Result Date: 07/28/2021 CLINICAL DATA:  Pleural effusion. EXAM: PORTABLE CHEST 1 VIEW COMPARISON:  Seventeen 2023 FINDINGS: Right chest tube remains in place. Possible small right apical pneumothorax has developed in the interval. Small right effusion unchanged. Right lower lobe airspace disease unchanged. Mild left lower lobe airspace disease unchanged. IMPRESSION: Right chest tube remains in place. Question small right apical pneumothorax. Small right effusion and right lower lobe airspace disease unchanged. Electronically Signed   By: Franchot Gallo M.D.   On: 07/28/2021 10:05   DG CHEST PORT 1 VIEW  Result Date: 07/27/2021 CLINICAL DATA:  Chest tube present and respiratory distress EXAM: PORTABLE CHEST 1 VIEW COMPARISON:  Yesterday FINDINGS: Right chest tube is unchanged in position. Right apical pleural thickening is new, indicative of pleural fluid. The previously described apical pneumothorax is not identified. There may be a small amount of sub pulmonic pleural air medially. Subcutaneous emphysema about the right chest wall is mildly increased. Midline trachea. Mild cardiomegaly. Improved patchy right-sided airspace disease. Minimal left base atelectasis laterally. IMPRESSION: Right chest tube remaining in place. The apical pneumothorax has been replaced by small volume pleural fluid. There may be residual sub pulmonic small volume right-sided pleural air. Cardiomegaly with improved right-sided airspace disease. Electronically Signed   By: Abigail Miyamoto M.D.   On: 07/27/2021 08:46   DG Chest Port 1 View  Result Date: 07/26/2021 CLINICAL DATA:  Follow-up right chest tube. EXAM: PORTABLE CHEST 1 VIEW COMPARISON:  07/25/2021 FINDINGS: Stable cardiomediastinal contours right chest tube is in place and stable from previous exam. Tiny  pneumothorax is identified which measures approximately 4 mm over the apical portions of the right lung. The rib this does not appear significantly changed from previous exam. Opacities within the right midlung and right lower lung are unchanged. Left lung is clear. IMPRESSION: 1. Stable right chest tube with tiny right apical pneumothorax. 2. No change in aeration to the right lung. Electronically Signed   By: Kerby Moors M.D.   On: 07/26/2021 08:48   DG Chest Port 1 View  Result Date: 07/25/2021 CLINICAL DATA:  Postop imaging. EXAM: PORTABLE CHEST 1 VIEW COMPARISON:  07/24/2021. FINDINGS: Significant improvement in right lung aeration since the prior study. The near complete opacification of the right hemithorax has been replaced by patchy airspace opacities throughout and  aerated right lung, with more confluent opacity at the right lung base. No pneumothorax. Right-sided chest tube has its tip near the right apex. IMPRESSION: 1. Significant improvement in right lung aeration consistent with evacuation of a right pleural effusion. 2. No pneumothorax.  Well-positioned right-sided chest tube. 3. Residual opacity noted throughout the right lung is consistent with atelectasis or infection. Electronically Signed   By: Lajean Manes M.D.   On: 07/25/2021 10:10   DG Chest Port 1 View  Result Date: 07/24/2021 CLINICAL DATA:  Follow-up large pleural effusion EXAM: PORTABLE CHEST 1 VIEW COMPARISON:  Chest radiograph from one day prior. FINDINGS: Stable cardiomediastinal silhouette with top-normal heart size. No pneumothorax. Large right pleural effusion nearly completely opacifying the right hemithorax, increased. No left pleural effusion. Clear left lung. IMPRESSION: Large right pleural effusion, nearly completely opacifying the right hemithorax, increased. Electronically Signed   By: Ilona Sorrel M.D.   On: 07/24/2021 08:10   DG CHEST PORT 1 VIEW  Result Date: 07/23/2021 CLINICAL DATA:  Pleural effusion.  Shortness of breath. Respiratory distress. EXAM: PORTABLE CHEST 1 VIEW COMPARISON:  07/22/2021; 07/21/2021; 07/20/2021; CT of the chest, abdomen and pelvis 07/21/2021 FINDINGS: Grossly unchanged enlarged cardiac silhouette and mediastinal contours. Interval increase in now large loculated right-sided pleural effusion with associated progressive compressive atelectasis/collapse without definitive mediastinal shift. Unchanged left basilar heterogeneous consolidative opacities. No definite left-sided pleural effusion. No evidence of edema. No pneumothorax. No acute osseous abnormalities. Degenerative change of the bilateral acromioclavicular joints. Stigmata of DISH within thoracic spine. IMPRESSION: 1. Progression of now large loculated right-sided effusion with associated progressive atelectasis/collapse. Further evaluation with repeat contrast-enhanced chest CT could be performed as indicated. 2. Unchanged left basilar heterogeneous consolidative opacities again worrisome for infection or aspiration. These results will be called to the ordering clinician or representative by the Radiologist Assistant, and communication documented in the PACS or [employer]. Electronically Signed   By: Sandi Mariscal M.D.   On: 07/23/2021 07:55   DG CHEST PORT 1 VIEW  Result Date: 07/22/2021 CLINICAL DATA:  Evaluate lungs EXAM: PORTABLE CHEST 1 VIEW COMPARISON:  Chest x-ray dated July 21, 2021 FINDINGS: Cardiac and mediastinal contours are unchanged. Moderate loculated right pleural effusion, increased in size when compared with prior exam. Bibasilar opacities. No evidence of pneumothorax. IMPRESSION: 1. Moderate loculated right pleural effusion, increased in size when compared to prior exam. 2. Bibasilar opacities, concerning for infection or aspiration. Electronically Signed   By: Yetta Owen M.D.   On: 07/22/2021 10:51   DG CHEST PORT 1 VIEW  Result Date: 07/21/2021 CLINICAL DATA:  Status post thoracentesis.  EXAM: PORTABLE CHEST 1 VIEW COMPARISON:  Chest radiograph 07/21/2021 at 6:40 a.m. FINDINGS: The cardiomediastinal silhouette is unchanged with normal heart size. There is a small right pleural effusion, decreased following interval thoracentesis. A small left-sided pleural effusion remains. There are patchy bibasilar airspace opacities, unchanged on the left and with improved aeration of the right lung base following thoracentesis. No pneumothorax is identified. IMPRESSION: 1. Decreased right pleural effusion following thoracentesis. No pneumothorax. 2. Persistent small bilateral. Pleural effusions and bibasilar atelectasis or infiltrates Electronically Signed   By: Logan Bores M.D.   On: 07/21/2021 10:27   DG Chest Port 1 View  Result Date: 07/21/2021 CLINICAL DATA:  Shortness of breath, respiratory distress, pleural effusions. EXAM: PORTABLE CHEST 1 VIEW COMPARISON:  Chest radiograph dated July 20, 2020. FINDINGS: The heart is enlarged. Hazy opacity in the right lower lung likely representing large pleural effusion with  associated atelectasis. Small left pleural effusion. Osseous structures are unremarkable. IMPRESSION: 1. Cardiomegaly. 2. Large right pleural effusion with associated atelectasis. Underlying airspace disease can not be excluded. Small left pleural effusion. Electronically Signed   By: Keane Police D.O.   On: 07/21/2021 08:59   DG CHEST PORT 1 VIEW  Result Date: 07/20/2021 CLINICAL DATA:  56 year old male with history of shortness of breath. Status post thoracentesis. EXAM: PORTABLE CHEST 1 VIEW COMPARISON:  Chest x-ray 07/20/2021. FINDINGS: Persistent moderate to large right pleural effusion appears slightly decreased compared to the prior study. No definite pneumothorax. Opacity in the right lung base, favored to predominantly reflect areas of passive subsegmental atelectasis although underlying airspace consolidation is not excluded. Left lung is clear. No evidence of pulmonary edema.  Heart size is normal. Upper mediastinal contours are within normal limits. IMPRESSION: 1. Slightly decreased size of moderate to large right pleural effusion following thoracentesis. No postprocedural pneumothorax. Persistent passive atelectasis in the right lung base. Electronically Signed   By: Vinnie Langton M.D.   On: 07/20/2021 12:20   DG Chest Port 1 View  Result Date: 07/20/2021 CLINICAL DATA:  Respiratory distress and pleural effusions. EXAM: PORTABLE CHEST 1 VIEW COMPARISON:  Portable chest today at 15:14, chest CT today at 02:43. FINDINGS: 4:15 a.m., 07/20/2021. The cardiac size is normal. The central vessels are borderline prominent without flow cephalization and no edema is seen. Moderate/large right and moderate left pleural effusions continue to be seen with overlying opacities consistent with atelectasis or consolidation, greater on the right. The left mid and both upper lung fields remain clear. No new or worsening lung opacity is seen. The pleural effusions seem unchanged. IMPRESSION: Right-greater-than-left pleural effusions with overlying atelectasis or consolidation. Overall aeration seems unchanged. Electronically Signed   By: Telford Nab M.D.   On: 07/20/2021 05:08   DG Chest Portable 1 View  Result Date: 07/19/2021 CLINICAL DATA:  thoracentesis EXAM: PORTABLE CHEST 1 VIEW COMPARISON:  Chest x-ray 07/19/2021. FINDINGS: The heart and mediastinal contours are unchanged. Bibasilar vague airspace opacity. No pulmonary edema. Bilateral trace to small bilateral effusions. No pneumothorax. No acute osseous abnormality. IMPRESSION: Bilateral trace to small bilateral effusions. Associated bibasilar vague airspace opacities likely representing atelectasis. Superimposed infection/inflammation not excluded. Electronically Signed   By: Iven Finn M.D.   On: 07/19/2021 15:28   DG Chest Portable 1 View  Result Date: 07/19/2021 CLINICAL DATA:  Status post thoracentesis.  Shortness of  breath. EXAM: PORTABLE CHEST 1 VIEW COMPARISON:  Chest radiograph 07/18/2021 and chest CTA 07/19/2021 FINDINGS: The cardiomediastinal silhouette is unchanged with normal heart size. There are persistent right larger than left pleural effusions with associated lower lobe atelectasis. Lung aeration is overall similar to yesterday's chest radiograph. No pneumothorax is identified. IMPRESSION: No pneumothorax. Bilateral pleural effusions and lower lobe atelectasis. Electronically Signed   By: Logan Bores M.D.   On: 07/19/2021 10:36   DG Chest Port 1 View  Result Date: 07/18/2021 CLINICAL DATA:  Shortness of breath EXAM: PORTABLE CHEST 1 VIEW COMPARISON:  07/12/2021 FINDINGS: Bilateral lower lobe airspace opacities and layering effusions. This is stable on the left, worsened on the right since prior study. Heart is borderline in size. No acute bony abnormality. IMPRESSION: Bilateral effusions with lower lobe atelectasis or infiltrates, worsening on the right since prior study. Electronically Signed   By: Rolm Baptise M.D.   On: 07/18/2021 22:14   ECHOCARDIOGRAM COMPLETE  Result Date: 07/19/2021    ECHOCARDIOGRAM REPORT   Patient Name:  Derek Owen Date of Exam: 07/19/2021 Medical Rec #:  [id number]       Height:       72.0 in Accession #:    [id number]      Weight:       314.0 lb Date of Birth:  12-Jun-1966       BSA:          2.581 m Patient Age:    73 years        BP:           126/89 mmHg Patient Gender: M               HR:           116 bpm. Exam Location:  Inpatient Procedure: 2D Echo, Cardiac Doppler, Color Doppler and Intracardiac            Opacification Agent Indications:    CHF  History:        Patient has no prior history of Echocardiogram examinations.                 Risk Factors:Hypertension and Diabetes.  Sonographer:    Jyl Heinz Referring Phys: [place]  1. Left ventricular ejection fraction, by estimation, is 55 to 60%. The left ventricle has normal function. The left  ventricle has no regional wall motion abnormalities. There is moderate asymmetric left ventricular hypertrophy of the basal-septal segment. Left ventricular diastolic parameters are indeterminate.  2. Right ventricular systolic function is normal. The right ventricular size is normal. Tricuspid regurgitation signal is inadequate for assessing PA pressure.  3. The mitral valve is normal in structure. No evidence of mitral valve regurgitation.  4. The aortic valve was not well visualized. Aortic valve regurgitation is not visualized. No aortic stenosis is present. FINDINGS  Left Ventricle: Left ventricular ejection fraction, by estimation, is 55 to 60%. The left ventricle has normal function. The left ventricle has no regional wall motion abnormalities. The left ventricular internal cavity size was small. There is moderate  asymmetric left ventricular hypertrophy of the basal-septal segment. Left ventricular diastolic parameters are indeterminate. Right Ventricle: The right ventricular size is normal. Right vetricular wall thickness was not well visualized. Right ventricular systolic function is normal. Tricuspid regurgitation signal is inadequate for assessing PA pressure. Left Atrium: Left atrial size was not well visualized. Right Atrium: Right atrial size was not well visualized. Pericardium: There is no evidence of pericardial effusion. Mitral Valve: The mitral valve is normal in structure. No evidence of mitral valve regurgitation. Tricuspid Valve: The tricuspid valve is normal in structure. Tricuspid valve regurgitation is trivial. Aortic Valve: The aortic valve was not well visualized. Aortic valve regurgitation is not visualized. No aortic stenosis is present. Aortic valve peak gradient measures 5.3 mmHg. Pulmonic Valve: The pulmonic valve was not well visualized. Pulmonic valve regurgitation is not visualized. Aorta: The aortic root and ascending aorta are structurally normal, with no evidence of dilitation.  IAS/Shunts: The interatrial septum was not well visualized.  LEFT VENTRICLE PLAX 2D LVIDd:         3.70 cm     Diastology LVIDs:         2.60 cm     LV e' medial:    7.62 cm/s LV PW:         1.20 cm     LV E/e' medial:  6.2 LV IVS:        1.70 cm     LV  e' lateral:   7.72 cm/s LVOT diam:     2.20 cm     LV E/e' lateral: 6.2 LV SV:         34 LV SV Index:   13 LVOT Area:     3.80 cm  LV Volumes (MOD) LV vol d, MOD A2C: 89.9 ml LV vol d, MOD A4C: 67.0 ml LV vol s, MOD A2C: 40.5 ml LV vol s, MOD A4C: 28.5 ml LV SV MOD A2C:     49.4 ml LV SV MOD A4C:     67.0 ml LV SV MOD BP:      47.7 ml RIGHT VENTRICLE RV Basal diam:  3.70 cm RV Mid diam:    3.20 cm RV S prime:     16.40 cm/s TAPSE (M-mode): 1.7 cm LEFT ATRIUM         Index       RIGHT ATRIUM           Index LA diam:    2.30 cm 0.89 cm/m  RA Area:     10.80 cm                                 RA Volume:   22.60 ml  8.76 ml/m  AORTIC VALVE AV Area (Vmax): 2.61 cm AV Vmax:        115.00 cm/s AV Peak Grad:   5.3 mmHg LVOT Vmax:      79.00 cm/s LVOT Vmean:     52.100 cm/s LVOT VTI:       0.089 m  AORTA Ao Root diam: 3.30 cm Ao Asc diam:  3.30 cm MITRAL VALVE MV Area (PHT): 5.23 cm    SHUNTS MV Decel Time: 145 msec    Systemic VTI:  0.09 m MV E velocity: 47.60 cm/s  Systemic Diam: 2.20 cm MV A velocity: 72.40 cm/s MV E/A ratio:  0.66 Oswaldo Milian MD Electronically signed by Oswaldo Milian MD Signature Date/Time: 07/19/2021/4:04:25 PM    Final    CT RENAL ABD W/WO  Result Date: 07/24/2021 CLINICAL DATA:  Indeterminate left renal lesion on recent CT. EXAM: CT ABDOMEN WITHOUT AND WITH CONTRAST TECHNIQUE: Multidetector CT imaging of the abdomen was performed following the standard protocol before and following the bolus administration of intravenous contrast. RADIATION DOSE REDUCTION: This exam was performed according to the departmental dose-optimization program which includes automated exposure control, adjustment of the mA and/or kV according to patient  size and/or use of iterative reconstruction technique. CONTRAST:  77mL OMNIPAQUE IOHEXOL 350 MG/ML SOLN COMPARISON:  07/21/2021 FINDINGS: Lower chest: Increased size of large right pleural effusion with compressive atelectasis right lower lung. Decreased airspace disease in posterior left lower lobe. Persistent tiny left pleural effusion. Hepatobiliary: No hepatic masses identified. Gallbladder is unremarkable. No evidence of biliary ductal dilatation. Pancreas:  No mass or inflammatory changes. Spleen:  Within normal limits in size and appearance. Adrenals/Urinary Tract: Normal appearance of adrenal glands and right kidney. A large low-attenuation lesion is seen in the upper pole of the left kidney, which measures 10.7 x 10.6 cm. This shows thin peripheral rim calcifications, as well as scattered internal calcifications likely within internal septations. This lesion shows no evidence of internal contrast enhancement. This meets criteria for a probably benign Bosniak category 74F lesion. No evidence of hydronephrosis. Stomach/Bowel: Diverticulosis is seen involving the visualized portion of the transverse colon, without signs of diverticulitis in this region. Vascular/Lymphatic:  No pathologically enlarged lymph nodes identified. No acute vascular findings. Aortic atherosclerotic calcification noted. Other:  None. Musculoskeletal:  No suspicious bone lesions identified. IMPRESSION: 10.6 cm complex cystic lesion in upper pole of left kidney, consistent with an indeterminate but probably benign Bosniak category 69F lesion. Recommend continued imaging follow-up in 6 months, preferably with abdomen MRI without and with contrast. Increased size of large right pleural effusion and compressive atelectasis. Decreased airspace disease in posterior left lower lobe. Persistent tiny left pleural effusion. Aortic Atherosclerosis (ICD10-I70.0). Electronically Signed   By: Marlaine Hind M.D.   On: 07/24/2021 15:25   VAS [place] LOWER  EXTREMITY VENOUS (DVT)  Result Date: 07/23/2021  Lower Venous DVT Study Patient Name:  Derek Owen  Date of Exam:   07/23/2021 Medical Rec #: [id number]        Accession #:    [id number] Date of Birth: 1965-12-28        Patient Gender: M Patient Age:   56 years Exam Location:  [facility] Procedure:      VAS [place] LOWER EXTREMITY VENOUS (DVT) Referring Phys: Noemi Chapel --------------------------------------------------------------------------------  Indications: Edema.  Comparison Study: No previous exams Performing Technologist: Jody Hill RVT, RDMS  Examination Guidelines: A complete evaluation includes B-mode imaging, spectral Doppler, color Doppler, and power Doppler as needed of all accessible portions of each vessel. Bilateral testing is considered an integral part of a complete examination. Limited examinations for reoccurring indications may be performed as noted. The reflux portion of the exam is performed with the patient in reverse Trendelenburg.  +---------+---------------+---------+-----------+----------+-------------------+  RIGHT     Compressibility Phasicity Spontaneity Properties Thrombus Aging       +---------+---------------+---------+-----------+----------+-------------------+  CFV       Full            Yes       Yes                                         +---------+---------------+---------+-----------+----------+-------------------+  SFJ       Full                                                                  +---------+---------------+---------+-----------+----------+-------------------+  FV Prox   Full            Yes       Yes                                         +---------+---------------+---------+-----------+----------+-------------------+  FV Mid    Full            Yes       Yes                    Not well visualized  +---------+---------------+---------+-----------+----------+-------------------+  FV Distal Full            Yes       Yes                    Not well  visualized  +---------+---------------+---------+-----------+----------+-------------------+  PFV  Full                                                                  +---------+---------------+---------+-----------+----------+-------------------+  POP       Full            Yes       Yes                                         +---------+---------------+---------+-----------+----------+-------------------+  PTV       Full                                                                  +---------+---------------+---------+-----------+----------+-------------------+  PERO      Full                                                                  +---------+---------------+---------+-----------+----------+-------------------+   +---------+---------------+---------+-----------+----------+-------------------+  LEFT      Compressibility Phasicity Spontaneity Properties Thrombus Aging       +---------+---------------+---------+-----------+----------+-------------------+  CFV       Full            Yes       Yes                                         +---------+---------------+---------+-----------+----------+-------------------+  SFJ       Full                                                                  +---------+---------------+---------+-----------+----------+-------------------+  FV Prox   Full            Yes       Yes                                         +---------+---------------+---------+-----------+----------+-------------------+  FV Mid    Full            Yes       Yes                    Not well visualized  +---------+---------------+---------+-----------+----------+-------------------+  FV Distal Full            Yes       Yes  Not well visualized  +---------+---------------+---------+-----------+----------+-------------------+  PFV       Full                                                                   +---------+---------------+---------+-----------+----------+-------------------+  POP       Full            Yes       Yes                                         +---------+---------------+---------+-----------+----------+-------------------+  PTV       Full                                                                  +---------+---------------+---------+-----------+----------+-------------------+  PERO      Full                                                                  +---------+---------------+---------+-----------+----------+-------------------+     Summary: BILATERAL: - No evidence of deep vein thrombosis seen in the lower extremities, bilaterally. - No evidence of superficial venous thrombosis in the lower extremities, bilaterally. - RIGHT: - No cystic structure found in the popliteal fossa.  LEFT: - A cystic structure is found in the popliteal fossa.  *See table(s) above for measurements and observations. Electronically signed by Jamelle Haring on 07/23/2021 at 2:54:28 PM.    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  Internal calcifications present. - VATS and right pleural biopsy on 07/25/2021 consistent with metastatic carcinoma, IHC stains positive for AE1/AE3 and PAX8.  Negative for CK7, weak positivity with Melan-A.  2 additional melanoma markers SOX-10 and HMB45 negative.  TTF-1, PSA, OCT 3/4, CD56, synaptophysin, calretinin negative.  Overall the findings are mostly suggestive of renal cell carcinoma. - I MDC intermediate grade    Social/family history: - He lives at home by himself. 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 - He smokes 4 to 5 cigarettes/day for last 15 years.  3.  Rheumatoid arthritis: - Followed by Dr. Benjamine Mola.  Diagnosed with seropositive rheumatoid arthritis and was treated with methotrexate 15 mg p.o.  weekly.  Involved joints left wrist and left knee and right knee to a lesser extent.   PLAN:  Metastatic kidney cancer to pleura: -We have reviewed bone scan results which showed single focus of indeterminate uptake in the right posterior rib, thought to correlate with subtle sclerosis in the right posterior ninth rib on recent CT scan.  The finding is nonspecific. - We discussed treatment options  with double immunotherapy versus combination immunotherapy with veg F TKI. - Because of his underlying rheumatoid arthritis, I do not want to start immunotherapy due to potential flareup.  We will reach out to Dr. Benjamine Mola his rheumatologist prior to considering it. - TKI options include pazopanib/cabozantinib. - I have recommended cabozantinib 40 mg daily with the intention of adding nivolumab once we get to talk to Dr. Benjamine Mola. - We discussed side effects of cabozantinib in detail.  We have given literature to the patient. - He was back-and-forth whether to start treatment because of financial issues.  Patient would like to think about it and call us and let us know in 2 days. - He has asked [place] to proceed with processing the prescription at this time.  We will cancel it if he does not wish to proceed with treatment.  2.  Microcytic anemia: -I have recommended anemia panel including ferritin, iron panel, B14, folic acid and MMA along with CBC. - He does not wish to do labs at this time.  3.  Shortness of breath: -He reports shortness of breath on exertion.       - Chest x-ray from 08/09/2021 showed no gross change in Small right pleural effusion.       - I think shortness of breath is from combination of anemia and malignancy.   Orders placed this encounter:  Orders Placed This Encounter  Procedures   CBC with Differential   Comprehensive metabolic panel   Iron and TIBC ([facility] ONLY)   Ferritin   Vitamin B12   Methylmalonic acid, serum   Folate     Derek Jack,  MD [place] [phone]   I, Thana Ates, am acting as a scribe for Dr. Derek Jack.  I, Derek Jack MD, have reviewed the above documentation for accuracy and completeness, and I agree with the above.

## 2021-08-17 NOTE — Patient Instructions (Addendum)
Derek Owen at [facility] Discharge Instructions   You were seen and examined today by Dr. Delton Coombes.  He reviewed the results of your scan and discussed treatment options. Dr. Raliegh Ip will reach out to your rheumatologist to discuss if treatment with immunotherapy is an option. He also discussed cancer treatment with a pill in addition to immunotherapy.   We will need to obtain lab work from you today.  Please stop by the lab once your visit is done.  Return as scheduled.      Thank you for choosing [place] at [facility] to provide your oncology and hematology care.  To afford each patient quality time with our provider, please arrive at least 15 minutes before your scheduled appointment time.   If you have a lab appointment with the [place] please come in thru the Main Entrance and check in at the main information desk.  You need to re-schedule your appointment should you arrive 10 or more minutes late.  We strive to give you quality time with our providers, and arriving late affects you and other patients whose appointments are after yours.  Also, if you no show three or more times for appointments you may be dismissed from the clinic at the providers discretion.     Again, thank you for choosing [facility].  Our hope is that these requests will decrease the amount of time that you wait before being seen by our physicians.       _____________________________________________________________  Should you have questions after your visit to [facility], please contact our office at [phone] and follow the prompts.  Our office hours are 8:00 a.m. and 4:30 p.m. Monday - Friday.  Please note that voicemails left after 4:00 p.m. may not be returned until the following business day.  We are closed weekends and major holidays.  You do have access to a nurse 24-7, just call the main number to the clinic [phone]  and do not press any options, hold on the line and a nurse will answer the phone.    For prescription refill requests, have your pharmacy contact our office and allow 72 hours.    Due to Covid, you will need to wear a mask upon entering the hospital. If you do not have a mask, a mask will be given to you at the Main Entrance upon arrival. For doctor visits, patients may have 1 support person age 4 or older with them. For treatment visits, patients can not have anyone with them due to social distancing guidelines and our immunocompromised population.

## 2021-08-17 NOTE — Telephone Encounter (Signed)
Oral Oncology Patient Advocate Encounter   Received notification from [employer] that prior authorization for Cabometyx is required.   PA submitted by fax to [phone] Case ID [id number] Status is pending   Oral Oncology Clinic will continue to follow.  [place] Patient [place] Phone [phone] Fax [phone] 08/23/2021 8:49 AM

## 2021-08-17 NOTE — Telephone Encounter (Signed)
Oral Oncology Pharmacist Encounter  Received new prescription for Cabometyx (cabozantinib) for the treatment of metastatic RCC, planned duration until disease progression or unacceptable drug toxicity. Immunotherapy is not being used at this time due to the patient's underlying rheumatoid arthritis.  CBC from 07/29/21 and CMP from 07/18/21 assessed, no relevant lab abnormalities. Recommend repeating CMP during treatment for LFT evaluation. Prescription dose and frequency assessed.   Current medication list in Epic reviewed, no DDIs with cabozantinib identified.  Evaluated chart and no patient barriers to medication adherence identified.   Prescription has been e-scribed to the [facility] for benefits analysis and approval.  Oral Oncology Clinic will continue to follow for insurance authorization, copayment issues, initial counseling and start date.   Darl Pikes, PharmD, BCPS, BCOP, CPP Hematology/Oncology Clinical Pharmacist Practitioner /DB/AP [facility] [place] [facility] [phone]  08/17/2021 2:34 PM

## 2021-08-18 ENCOUNTER — Other Ambulatory Visit (HOSPITAL_COMMUNITY): Payer: Self-pay

## 2021-08-18 ENCOUNTER — Ambulatory Visit: Payer: Self-pay | Admitting: Surgery

## 2021-08-18 MED ORDER — CABOMETYX 40 MG PO TABS
40.0000 mg | ORAL_TABLET | Freq: Every day | ORAL | 0 refills | Status: DC
  Filled 2021-08-18: qty 30, 30d supply, fill #0

## 2021-08-23 ENCOUNTER — Other Ambulatory Visit (HOSPITAL_COMMUNITY): Payer: Self-pay

## 2021-08-23 MED ORDER — CABOMETYX 40 MG PO TABS: 40.0000 mg | ORAL_TABLET | Freq: Every day | ORAL | 0 refills | Status: AC

## 2021-08-23 NOTE — Telephone Encounter (Signed)
Oral Oncology Patient Advocate Encounter  Prior Authorization for Cabometyx has been approved.    PA# [id number] Effective dates: 08/20/21 through 08/23/22  Patient must use [facility] pharmacy.  Oral Oncology Clinic will continue to follow.   [place] Patient [place] Phone [phone] Fax [phone] 08/23/2021 11:47 AM

## 2021-08-23 NOTE — Progress Notes (Signed)
Office Visit Note  Patient: Derek Owen             Date of Birth: [birth year]           MRN: [id number]             PCP: Renee Rival, FNP Referring: Noreene Larsson, NP Visit Date: 08/24/2021   Subjective:   History of Present Illness: Derek Owen is a 57 y.o. male here for follow up for seropositive RA. Since our last visit he was found to have metastatic renal cell carcinoma identified on cytology from large pleural effusion drainage. He was taking methotrexate but now off this medication for about 1 month he restarted taking 5 tablets because of worsening left wrist pain. Besides the wrist his knees have been doing well although he notices some pain and numbness in his anterior and lateral thighs. He is needing to stop methotrexate for his cancer treatment planning. His oncologist Dr. Delton Coombes also discussed with plan possibly including nivolumab treatment may worsening inflammatory arthritis activity.   Previous HPI 05/17/21 Derek Owen is a 56 y.o. 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  His joint pain is doing worse than our last visit.  Pretty much has had increase symptoms ever since stopping the prednisone 5 mg dose mostly involvement of the left wrist left knee and right knee to a lesser extent.  He has not seen very large changes in swelling but does have joint stiffness with some decreased mobility.  His hemoglobin A1c improved a lot with these recent changes down to 8% from previously above 11.  He also sustained a recent fall again on the left side causing increased joint pain.   Previous HPI 10/12/20 Derek Owen is a 56 y.o. male with a history of gout, T2DM, CVA here for evaluation of joint pains with elevated RBC count, CRP, ANA, and RF. His current problems started about 2 months ago with increased pain in the hand, wrist, and knees. He has not improved much with use of colchicine and had less than 1 week improvement  with intraarticular steroids. He saw Dr. 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   Review of Systems  Constitutional:  Negative for fatigue.  HENT:  Negative for mouth sores, mouth dryness and nose dryness.   Eyes:  Negative for pain, itching and dryness.  Respiratory:  Positive for shortness of breath and difficulty breathing.   Cardiovascular:  Negative for chest pain and palpitations.  Gastrointestinal:  Negative for blood in stool, constipation and diarrhea.  Endocrine: Negative for increased urination.  Genitourinary:  Negative for difficulty urinating.  Musculoskeletal:  Positive for joint pain, joint pain, joint swelling and morning stiffness. Negative for myalgias, muscle tenderness and myalgias.  Skin:  Negative for color change, rash and redness.  Allergic/Immunologic: Negative for susceptible to infections.  Neurological:  Negative for dizziness, numbness, headaches, memory loss and weakness.  Hematological:  Negative for bruising/bleeding tendency.  Psychiatric/Behavioral:  Negative for confusion.    PMFS History:  Patient Active Problem List   Diagnosis Date Noted   Hospital discharge follow-up 08/31/2021   Pneumothorax 08/13/2021   Metastasis to lung ([place]) 08/13/2021   Cancer of kidney parenchyma, left ([place]) 08/09/2021   Iron deficiency anemia  07/29/2021   Morbid obesity ([place]) 07/28/2021   Acute respiratory failure with hypoxia ([place]) 07/19/2021   Recurrent pleural effusion 07/19/2021   Mass of abdomen 07/19/2021   Uncontrolled type 2 diabetes mellitus with hyperglycemia ([place]) 11/17/2020   Hypertension associated with diabetes ([place]) 11/17/2020   RBC microcytosis 10/22/2020   High risk medication use 10/13/2020   Polyarthritis with positive rheumatoid factor ([place])  10/12/2020   Immunization due 10/08/2020   Gout 08/31/2020    Past Medical History:  Diagnosis Date   Alcohol use disorder, mild, abuse    Cigarette smoker    Diabetes mellitus without complication ([place])    Hyperlipidemia    Hypertension    Polycythemia 04/13/2020   Stroke ([place])    x3    Family History  Adopted: Yes   Past Surgical History:  Procedure Laterality Date   PLEURAL BIOPSY Right 07/25/2021   Procedure: PLEURAL BIOPSY;  Surgeon: Gaye Pollack, MD;  Location: [facility];  Service: Thoracic;  Laterality: Right;   PLEURAL EFFUSION DRAINAGE Right 07/25/2021   Procedure: DRAINAGE OF PLEURAL EFFUSION;  Surgeon: Gaye Pollack, MD;  Location: [facility];  Service: Thoracic;  Laterality: Right;   VIDEO ASSISTED THORACOSCOPY (VATS)/EMPYEMA Right 07/25/2021   Procedure: VIDEO ASSISTED THORACOSCOPY (VATS)/EMPYEMA;  Surgeon: Gaye Pollack, MD;  Location: [facility];  Service: Thoracic;  Laterality: Right;   Social History   Social History Narrative   Not on file   Immunization History  Administered Date(s) Administered   Tdap 10/19/2010, 10/08/2020     Objective: Vital Signs: BP 110/80 (BP Location: Left Arm, Patient Position: Sitting, Cuff Size: Large)    Pulse (!) 116    Ht 6' (1.829 m)    Wt (!) 304 lb (137.9 kg)    BMI 41.23 kg/m    Physical Exam Constitutional:      Appearance: He is obese.  Cardiovascular:     Rate and Rhythm: Regular rhythm. Tachycardia present.  Pulmonary:     Effort: Pulmonary effort is normal.     Comments: Mild decreased air movement and inspiratory crackles in lung bases Skin:    General: Skin is warm and dry.  Neurological:     Mental Status: He is alert.  Psychiatric:        Mood and Affect: Mood normal.     Musculoskeletal Exam: Shoulders full ROM no tenderness or swelling Elbows full ROM no tenderness or swelling Wrists full ROM no tenderness or swelling Fingers full ROM no tenderness or swelling Knees bilateral crepitus, left knee  reduced ROM without effusion minimally tender Ankles full ROM no tenderness or swelling   Investigation: No additional findings.  Imaging: DG Chest 1 View  Result Date: 08/27/2021 CLINICAL DATA:  Malignant LEFT pleural effusion post thoracentesis EXAM: CHEST  1 VIEW COMPARISON:  08/26/2021 FINDINGS: Enlargement of cardiac silhouette. Mediastinal contours normal. Bibasilar effusions and atelectasis greater on LEFT, accentuated by expiratory technique. No pneumothorax following LEFT thoracentesis. Osseous structures unremarkable. IMPRESSION: No pneumothorax following LEFT thoracentesis. Residual bibasilar effusions and atelectasis accentuated by expiratory technique. Electronically Signed   By: Lavonia Dana M.D.   On: 08/27/2021 12:22   DG Chest 2 View  Result Date: 08/09/2021 CLINICAL DATA:  Follow-up malignant pleural effusion. EXAM: CHEST - 2 VIEW COMPARISON:  07/28/2021 FINDINGS: Small right pleural effusion without significant change. Small residual right pneumothorax, with an interval mild decrease in size and partially filled in with pleural fluid. Increased linear and ill-defined density at the right lung base. Clear  left lung. Mildly enlarged cardiac silhouette with an interval decrease in size. Thoracic spine degenerative changes. IMPRESSION: 1. Interval decrease in size of a small right pneumothorax, partially filled with fluid in the interim. 2. No gross change in size of a small right pleural effusion. 3. Increased right basilar atelectasis. 4. Mild cardiomegaly with improvement. Electronically Signed   By: Claudie Revering M.D.   On: 08/09/2021 09:26   CT Angio Chest PE W/Cm &/Or Wo Cm  Result Date: 08/26/2021 CLINICAL DATA:  Ongoing worsening shortness of breath, diabetes mellitus, hypertension, renal cancer, anemia, question pulmonary embolism EXAM: CT ANGIOGRAPHY CHEST WITH CONTRAST TECHNIQUE: Multidetector CT imaging of the chest was performed using the standard protocol during bolus  administration of intravenous contrast. Multiplanar CT image reconstructions and MIPs were obtained to evaluate the vascular anatomy. RADIATION DOSE REDUCTION: This exam was performed according to the departmental dose-optimization program which includes automated exposure control, adjustment of the mA and/or kV according to patient size and/or use of iterative reconstruction technique. CONTRAST:  128mL OMNIPAQUE IOHEXOL 350 MG/ML SOLN IV COMPARISON:  CT chest abdomen pelvis 07/21/2021 FINDINGS: Cardiovascular: Aorta normal caliber without aneurysm or dissection. Minimal atherosclerotic calcifications aorta and coronary arteries. Minimal pericardial fluid or thickening. Heart otherwise unremarkable. Pulmonary arteries adequately opacified. Assessment of pulmonary arteries is limited by respiratory motion. No definite pulmonary emboli identified. Mediastinum/Nodes: Esophagus unremarkable. Several tiny nodules are seen within the thyroid lobes bilaterally, largest in 10 mm; no follow-up imaging recommended (ref: J Am Coll Radiol. 2015 Feb;12(2): 143-50). No thoracic adenopathy. Chronic stranding of fat at the RIGHT cardiophrenic angle with normal sized lymph node. Lungs/Pleura: Moderate LEFT pleural effusion. Small RIGHT pleural effusion, at least partially loculated. Pleural thickening and question slight irregularity/nodularity at the inferior RIGHT hemithorax, suspicious for carcinomatosis. Compressive atelectasis of BILATERAL lower lobes and lingula. No acute infiltrate or pneumothorax. Tiny calcified granuloma RIGHT upper lobe image 37. Mild paraseptal emphysema at RIGHT apex. Upper Abdomen: Large peripherally calcified cystic lesion at the upper pole of the LEFT kidney, at least 10.9 x 10.2 cm, incompletely visualized. Fatty infiltration of liver. Remaining visualized upper abdomen unremarkable. Musculoskeletal: Cortical thickening and mild sclerosis of the posterior RIGHT ninth rib unchanged. No acute osseous  findings. Review of the MIP images confirms the above findings. IMPRESSION: Significant limitations of exam secondary to respiratory motion. No gross large or central pulmonary emboli identified; unable to exclude small emboli by this exam. Moderate LEFT pleural effusion. Small RIGHT pleural effusion, appears at least partially loculated with areas of pleural thickening and question nodularity, cannot exclude pleural carcinomatosis. Compressive atelectasis in both lungs. Peripherally calcified mass at upper pole LEFT kidney at least 10.9 cm in diameter, incompletely visualized, seen on prior imaging. Fatty infiltration of liver. Aortic Atherosclerosis (ICD10-I70.0) and Emphysema (ICD10-J43.9). Electronically Signed   By: Lavonia Dana M.D.   On: 08/26/2021 18:12   NM Bone Scan Whole Body  Result Date: 08/15/2021 CLINICAL DATA:  Renal cancer staging. EXAM: NUCLEAR MEDICINE WHOLE BODY BONE SCAN TECHNIQUE: Whole body anterior and posterior images were obtained approximately 3 hours after intravenous injection of radiopharmaceutical. RADIOPHARMACEUTICALS:  20.1 mCi Technetium-58m MDP IV COMPARISON:  CT scan of the chest July 21, 2021 FINDINGS: Degenerative changes in the feet, knees, AC joints, wrists, and thoracic spine. Mild uptake in multiple adjacent medial right ribs posteriorly correlates with degenerative changes at multiple costovertebral junctions. A single focus of uptake is seen in a posterior right rib. There is subtle sclerosis in this rib on series 4,  image 102 of the comparison CT. The uptake and subtle sclerosis likely correlate. No other abnormal bony uptake. Soft tissue uptake is unremarkable. IMPRESSION: 1. There is a single focus of indeterminate uptake in a right posterior rib thought to correlate with subtle sclerosis in the right posterior ninth rib on recent CT imaging of the chest. The finding is not specific. An early metastatic lesion is not excluded. Recommend attention on follow-up. 2.  Degenerative changes as above. Electronically Signed   By: Dorise Bullion III M.D.   On: 08/15/2021 15:42   US Venous Img Lower Bilateral (DVT)  Result Date: 08/27/2021 CLINICAL DATA:  56 year old male with a history of edema EXAM: BILATERAL LOWER EXTREMITY VENOUS DOPPLER ULTRASOUND TECHNIQUE: Gray-scale sonography with graded compression, as well as color Doppler and duplex ultrasound were performed to evaluate the lower extremity deep venous systems from the level of the common femoral vein and including the common femoral, femoral, profunda femoral, popliteal and calf veins including the posterior tibial, peroneal and gastrocnemius veins when visible. The superficial great saphenous vein was also interrogated. Spectral Doppler was utilized to evaluate flow at rest and with distal augmentation maneuvers in the common femoral, femoral and popliteal veins. COMPARISON:  None. FINDINGS: RIGHT LOWER EXTREMITY Common Femoral Vein: No evidence of thrombus. Normal compressibility, respiratory phasicity and response to augmentation. Saphenofemoral Junction: No evidence of thrombus. Normal compressibility and flow on color Doppler imaging. Profunda Femoral Vein: No evidence of thrombus. Normal compressibility and flow on color Doppler imaging. Femoral Vein: No evidence of thrombus. Normal compressibility, respiratory phasicity and response to augmentation. Popliteal Vein: No evidence of thrombus. Normal compressibility, respiratory phasicity and response to augmentation. Calf Veins: Posterior tibial vein patent and compressible. Limited visualization of the peroneal vein. Superficial Great Saphenous Vein: No evidence of thrombus. Normal compressibility and flow on color Doppler imaging. Other Findings:  Edema LEFT LOWER EXTREMITY Common Femoral Vein: No evidence of thrombus. Normal compressibility, respiratory phasicity and response to augmentation. Saphenofemoral Junction: No evidence of thrombus. Normal  compressibility and flow on color Doppler imaging. Profunda Femoral Vein: No evidence of thrombus. Normal compressibility and flow on color Doppler imaging. Femoral Vein: No evidence of thrombus. Normal compressibility, respiratory phasicity and response to augmentation. Popliteal Vein: No evidence of thrombus. Normal compressibility, respiratory phasicity and response to augmentation. Calf Veins: Swelling somewhat limits evaluation of the tibial veins. Posterior tibial veins demonstrate maintained flow. Superficial Great Saphenous Vein: No evidence of thrombus. Normal compressibility and flow on color Doppler imaging. Other Findings:  Edema IMPRESSION: Directed duplex of the bilateral lower extremity negative for DVT. Edema bilaterally Electronically Signed   By: Corrie Mckusick D.O.   On: 08/27/2021 12:13   DG Chest Port 1 View  Result Date: 08/26/2021 CLINICAL DATA:  Shortness of breath EXAM: PORTABLE CHEST 1 VIEW COMPARISON:  Previous studies including the examination of 08/09/2021 FINDINGS: Cardiac size is unremarkable. Apparent shift of mediastinum to the right may be due to rotation. There are patchy infiltrates in the right parahilar region and right lower lung fields with no significant change. New small linear densities seen in left lower lung fields. Left hemidiaphragm is elevated. There is blunting of both lateral CP angles. There is no pneumothorax. IMPRESSION: Patchy infiltrates in right lower lung fields may suggest atelectasis/pneumonia. New linear densities in the left lower lung fields suggest subsegmental atelectasis. Small bilateral pleural effusions with interval increase in the left pleural effusion. Electronically Signed   By: Elmer Picker M.D.   On: 08/26/2021 16:39  US THORACENTESIS ASP PLEURAL SPACE W/IMG GUIDE  Result Date: 08/27/2021 INDICATION: Malignant LEFT pleural effusion EXAM: ULTRASOUND GUIDED LEFT THORACENTESIS MEDICATIONS: None. COMPLICATIONS: None immediate.  PROCEDURE: An ultrasound guided thoracentesis was thoroughly discussed with the patient and questions answered. The benefits, risks, alternatives and complications were also discussed. The patient understands and wishes to proceed with the procedure. Written consent was obtained. Ultrasound was performed to localize and mark an adequate pocket of fluid in the LEFT chest. The area was then prepped and draped in the normal sterile fashion. 1% Lidocaine was used for local anesthesia. Under ultrasound guidance a 6 Fr Safe-T-Centesis catheter was introduced. Thoracentesis was performed. The catheter was removed and a dressing applied. FINDINGS: A total of approximately 1.1 L of old dark bloody pleural fluid was removed. IMPRESSION: Successful ultrasound guided LEFT thoracentesis yielding 1.1 L of pleural fluid. Electronically Signed   By: Lavonia Dana M.D.   On: 08/27/2021 12:21    Recent Labs: Lab Results  Component Value Date   WBC 6.3 08/27/2021   HGB 9.3 (L) 08/27/2021   PLT 214 08/27/2021   NA 139 08/27/2021   K 3.5 08/27/2021   CL 103 08/27/2021   CO2 24 08/27/2021   GLUCOSE 212 (H) 08/27/2021   BUN 17 08/27/2021   CREATININE 0.97 08/27/2021   BILITOT 0.7 08/26/2021   ALKPHOS 96 08/26/2021   AST 20 08/26/2021   ALT 17 08/26/2021   PROT 7.9 08/26/2021   ALBUMIN 3.7 08/26/2021   CALCIUM 8.7 (L) 08/27/2021   GFRAA 115 01/12/2021   QFTBGOLDPLUS Indeterminate (A) 07/20/2021    Speciality Comments: No specialty comments available.  Procedures:  No procedures performed Allergies: Patient has no known allergies.   Assessment / Plan:     Visit Diagnoses: Polyarthritis with positive rheumatoid factor (HCC) High risk medication use  Will need to discuss with his oncologist Dr. Delton Coombes alternative plans since keeping off methotrexate. His joint disease is not currently that bad so not urgent to put on therapy this moment. Would consider TNF inhibition if severely worsens with  immunotherapy, or orencia would be lower risk for decreasing overall activity vs malignancy.  Cancer of kidney parenchyma, left (HCC) Recurrent pleural effusion  Ongoing follow up with pulmonology with repeat drainage, plan to wait and see if treatment resolve the reaccumulations before any long term steps.  Orders: No orders of the defined types were placed in this encounter.  No orders of the defined types were placed in this encounter.    Follow-Up Instructions: No follow-ups on file.   Collier Salina, MD  Note - This record has been created using [employer].  Chart creation errors have been sought, but may not always  have been located. Such creation errors do not reflect on  the standard of medical care.

## 2021-08-23 NOTE — Telephone Encounter (Signed)
Oral Chemotherapy Pharmacist Encounter  Due to insurance restriction the medication could not be filled at [facility]. Prescription has been e-scribed to [facility].  Supportive information was faxed to [facility]. We will continue to follow medication access.    Darl Pikes, PharmD, BCPS, [facility] Hematology/Oncology Clinical Pharmacist [facility] [phone]  08/23/2021 11:49 AM

## 2021-08-24 ENCOUNTER — Other Ambulatory Visit: Payer: Self-pay

## 2021-08-24 ENCOUNTER — Encounter: Payer: Self-pay | Admitting: Internal Medicine

## 2021-08-24 ENCOUNTER — Ambulatory Visit (INDEPENDENT_AMBULATORY_CARE_PROVIDER_SITE_OTHER): Payer: Managed Care, Other (non HMO) | Admitting: Internal Medicine

## 2021-08-24 VITALS — BP 110/80 | HR 116 | Ht 72.0 in | Wt 304.0 lb

## 2021-08-24 DIAGNOSIS — J90 Pleural effusion, not elsewhere classified: Secondary | ICD-10-CM | POA: Diagnosis not present

## 2021-08-24 DIAGNOSIS — Z79899 Other long term (current) drug therapy: Secondary | ICD-10-CM | POA: Diagnosis not present

## 2021-08-24 DIAGNOSIS — C642 Malignant neoplasm of left kidney, except renal pelvis: Secondary | ICD-10-CM

## 2021-08-24 DIAGNOSIS — M0580 Other rheumatoid arthritis with rheumatoid factor of unspecified site: Secondary | ICD-10-CM | POA: Diagnosis not present

## 2021-08-26 ENCOUNTER — Emergency Department (HOSPITAL_COMMUNITY): Payer: Managed Care, Other (non HMO)

## 2021-08-26 ENCOUNTER — Encounter (HOSPITAL_COMMUNITY): Payer: Self-pay | Admitting: *Deleted

## 2021-08-26 ENCOUNTER — Encounter (HOSPITAL_COMMUNITY): Payer: Self-pay

## 2021-08-26 ENCOUNTER — Other Ambulatory Visit (HOSPITAL_COMMUNITY): Payer: Managed Care, Other (non HMO)

## 2021-08-26 ENCOUNTER — Observation Stay (HOSPITAL_COMMUNITY)
Admission: EM | Admit: 2021-08-26 | Discharge: 2021-08-27 | Disposition: A | Discharge status: Home / Self Care | Payer: Managed Care, Other (non HMO) | Attending: Family Medicine | Admitting: Family Medicine

## 2021-08-26 ENCOUNTER — Other Ambulatory Visit: Payer: Self-pay

## 2021-08-26 DIAGNOSIS — Z20822 Contact with and (suspected) exposure to covid-19: Secondary | ICD-10-CM | POA: Insufficient documentation

## 2021-08-26 DIAGNOSIS — D509 Iron deficiency anemia, unspecified: Secondary | ICD-10-CM | POA: Diagnosis not present

## 2021-08-26 DIAGNOSIS — C642 Malignant neoplasm of left kidney, except renal pelvis: Secondary | ICD-10-CM | POA: Diagnosis not present

## 2021-08-26 DIAGNOSIS — J910 Malignant pleural effusion: Secondary | ICD-10-CM

## 2021-08-26 DIAGNOSIS — K760 Fatty (change of) liver, not elsewhere classified: Secondary | ICD-10-CM | POA: Insufficient documentation

## 2021-08-26 DIAGNOSIS — Z87891 Personal history of nicotine dependence: Secondary | ICD-10-CM | POA: Diagnosis not present

## 2021-08-26 DIAGNOSIS — Z79899 Other long term (current) drug therapy: Secondary | ICD-10-CM | POA: Diagnosis not present

## 2021-08-26 DIAGNOSIS — R609 Edema, unspecified: Secondary | ICD-10-CM

## 2021-08-26 DIAGNOSIS — I10 Essential (primary) hypertension: Secondary | ICD-10-CM | POA: Insufficient documentation

## 2021-08-26 DIAGNOSIS — C7800 Secondary malignant neoplasm of unspecified lung: Secondary | ICD-10-CM | POA: Diagnosis not present

## 2021-08-26 DIAGNOSIS — R0609 Other forms of dyspnea: Secondary | ICD-10-CM | POA: Diagnosis not present

## 2021-08-26 DIAGNOSIS — M069 Rheumatoid arthritis, unspecified: Secondary | ICD-10-CM | POA: Insufficient documentation

## 2021-08-26 DIAGNOSIS — C649 Malignant neoplasm of unspecified kidney, except renal pelvis: Secondary | ICD-10-CM

## 2021-08-26 DIAGNOSIS — C782 Secondary malignant neoplasm of pleura: Secondary | ICD-10-CM | POA: Diagnosis not present

## 2021-08-26 DIAGNOSIS — R600 Localized edema: Secondary | ICD-10-CM | POA: Insufficient documentation

## 2021-08-26 DIAGNOSIS — Z9889 Other specified postprocedural states: Secondary | ICD-10-CM

## 2021-08-26 DIAGNOSIS — M1310 Monoarthritis, not elsewhere classified, unspecified site: Secondary | ICD-10-CM | POA: Insufficient documentation

## 2021-08-26 DIAGNOSIS — Z6841 Body Mass Index (BMI) 40.0 and over, adult: Secondary | ICD-10-CM | POA: Insufficient documentation

## 2021-08-26 DIAGNOSIS — Z7982 Long term (current) use of aspirin: Secondary | ICD-10-CM | POA: Diagnosis not present

## 2021-08-26 DIAGNOSIS — I152 Hypertension secondary to endocrine disorders: Secondary | ICD-10-CM | POA: Diagnosis present

## 2021-08-26 DIAGNOSIS — J90 Pleural effusion, not elsewhere classified: Secondary | ICD-10-CM | POA: Diagnosis present

## 2021-08-26 DIAGNOSIS — M0580 Other rheumatoid arthritis with rheumatoid factor of unspecified site: Secondary | ICD-10-CM | POA: Diagnosis present

## 2021-08-26 DIAGNOSIS — I700 Atherosclerosis of aorta: Secondary | ICD-10-CM | POA: Diagnosis not present

## 2021-08-26 DIAGNOSIS — E1165 Type 2 diabetes mellitus with hyperglycemia: Secondary | ICD-10-CM | POA: Diagnosis not present

## 2021-08-26 DIAGNOSIS — E785 Hyperlipidemia, unspecified: Secondary | ICD-10-CM | POA: Insufficient documentation

## 2021-08-26 DIAGNOSIS — J984 Other disorders of lung: Secondary | ICD-10-CM | POA: Insufficient documentation

## 2021-08-26 LAB — CBC
HCT: 35.4 % — ABNORMAL LOW (ref 39.0–52.0)
Hemoglobin: 10.9 g/dL — ABNORMAL LOW (ref 13.0–17.0)
MCH: 23.6 pg — ABNORMAL LOW (ref 26.0–34.0)
MCHC: 30.8 g/dL (ref 30.0–36.0)
MCV: 76.6 fL — ABNORMAL LOW (ref 80.0–100.0)
Platelets: 224 10*3/uL (ref 150–400)
RBC: 4.62 MIL/uL (ref 4.22–5.81)
RDW: 18.6 % — ABNORMAL HIGH (ref 11.5–15.5)
WBC: 7.1 10*3/uL (ref 4.0–10.5)
nRBC: 0 % (ref 0.0–0.2)

## 2021-08-26 LAB — COMPREHENSIVE METABOLIC PANEL
ALT: 17 U/L (ref 0–44)
AST: 20 U/L (ref 15–41)
Albumin: 3.7 g/dL (ref 3.5–5.0)
Alkaline Phosphatase: 96 U/L (ref 38–126)
Anion gap: 16 — ABNORMAL HIGH (ref 5–15)
BUN: 19 mg/dL (ref 6–20)
CO2: 23 mmol/L (ref 22–32)
Calcium: 9 mg/dL (ref 8.9–10.3)
Chloride: 98 mmol/L (ref 98–111)
Creatinine, Ser: 1.08 mg/dL (ref 0.61–1.24)
GFR, Estimated: >60 mL/min (ref >60)
Glucose, Bld: 209 mg/dL — ABNORMAL HIGH (ref 70–99)
Potassium: 3.3 mmol/L — ABNORMAL LOW (ref 3.5–5.1)
Sodium: 137 mmol/L (ref 135–145)
Total Bilirubin: 0.7 mg/dL (ref 0.3–1.2)
Total Protein: 7.9 g/dL (ref 6.5–8.1)

## 2021-08-26 LAB — TYPE AND SCREEN
ABO/RH(D): A POS
Antibody Screen: NEGATIVE

## 2021-08-26 LAB — BRAIN NATRIURETIC PEPTIDE: B Natriuretic Peptide: 17 pg/mL (ref 0.0–100.0)

## 2021-08-26 LAB — RESP PANEL BY RT-PCR (FLU A&B, COVID) ARPGX2
Influenza A by PCR: NEGATIVE
Influenza B by PCR: NEGATIVE
SARS Coronavirus 2 by RT PCR: NEGATIVE

## 2021-08-26 LAB — LACTIC ACID, PLASMA
Lactic Acid, Venous: 2.5 mmol/L (ref 0.5–1.9)
Lactic Acid, Venous: 2.8 mmol/L (ref 0.5–1.9)

## 2021-08-26 LAB — GLUCOSE, CAPILLARY: Glucose-Capillary: 204 mg/dL — ABNORMAL HIGH (ref 70–99)

## 2021-08-26 MED ORDER — SODIUM CHLORIDE 0.9 % IV SOLN
1.0000 g | Freq: Once | INTRAVENOUS | Status: AC
  Administered 2021-08-26: 1 g via INTRAVENOUS
  Filled 2021-08-26: qty 10

## 2021-08-26 MED ORDER — ACETAMINOPHEN 325 MG PO TABS
650.0000 mg | ORAL_TABLET | Freq: Four times a day (QID) | ORAL | Status: DC | PRN
Start: 2021-08-26 — End: 2021-08-27

## 2021-08-26 MED ORDER — ACETAMINOPHEN 650 MG RE SUPP: 650.0000 mg | Freq: Four times a day (QID) | RECTAL | Status: DC | PRN

## 2021-08-26 MED ORDER — ATORVASTATIN CALCIUM 40 MG PO TABS
40.0000 mg | ORAL_TABLET | Freq: Every day | ORAL | Status: DC
Start: 2021-08-26 — End: 2021-08-27
  Administered 2021-08-26 – 2021-08-27 (×2): 40 mg via ORAL
  Filled 2021-08-26 (×2): qty 1

## 2021-08-26 MED ORDER — IOHEXOL 350 MG/ML SOLN
100.0000 mL | Freq: Once | INTRAVENOUS | Status: AC | PRN
  Administered 2021-08-26: 100 mL via INTRAVENOUS

## 2021-08-26 MED ORDER — INSULIN ASPART 100 UNIT/ML IJ SOLN
0.0000 [IU] | Freq: Three times a day (TID) | INTRAMUSCULAR | Status: DC
  Administered 2021-08-27: 11 [IU] via SUBCUTANEOUS
  Administered 2021-08-27: 3 [IU] via SUBCUTANEOUS

## 2021-08-26 MED ORDER — SODIUM CHLORIDE 0.9 % IV SOLN
500.0000 mg | Freq: Once | INTRAVENOUS | Status: AC
  Administered 2021-08-26: 500 mg via INTRAVENOUS
  Filled 2021-08-26: qty 5

## 2021-08-26 MED ORDER — SODIUM CHLORIDE 0.9 % IV BOLUS
500.0000 mL | Freq: Once | INTRAVENOUS | Status: AC
  Administered 2021-08-26: 500 mL via INTRAVENOUS

## 2021-08-26 MED ORDER — INSULIN ASPART 100 UNIT/ML IJ SOLN
0.0000 [IU] | Freq: Every day | INTRAMUSCULAR | Status: DC
  Administered 2021-08-26: 2 [IU] via SUBCUTANEOUS
  Administered 2021-08-27: 5 [IU] via SUBCUTANEOUS

## 2021-08-26 MED ORDER — ENOXAPARIN SODIUM 80 MG/0.8ML IJ SOSY: 0.5000 mg/kg | PREFILLED_SYRINGE | INTRAMUSCULAR | Status: DC

## 2021-08-26 MED ORDER — ALBUTEROL SULFATE (2.5 MG/3ML) 0.083% IN NEBU
2.5000 mg | INHALATION_SOLUTION | RESPIRATORY_TRACT | Status: DC | PRN
  Administered 2021-08-27: 2.5 mg via RESPIRATORY_TRACT
  Filled 2021-08-26: qty 3

## 2021-08-26 MED ORDER — AMLODIPINE BESYLATE 5 MG PO TABS
10.0000 mg | ORAL_TABLET | Freq: Every day | ORAL | Status: DC
  Administered 2021-08-26 – 2021-08-27 (×2): 10 mg via ORAL
  Filled 2021-08-26 (×2): qty 2

## 2021-08-26 MED ORDER — CABOZANTINIB S-MALATE 40 MG PO TABS: 40.0000 mg | ORAL_TABLET | Freq: Every day | ORAL | Status: DC

## 2021-08-26 MED ORDER — FOLIC ACID 1 MG PO TABS
1.0000 mg | ORAL_TABLET | Freq: Every day | ORAL | Status: DC
  Administered 2021-08-26 – 2021-08-27 (×2): 1 mg via ORAL
  Filled 2021-08-26 (×2): qty 1

## 2021-08-26 NOTE — H&P (Signed)
TRH H&P   Patient Demographics:    Derek Owen, is a 56 y.o. male  MRN: [id number]   DOB - June 27, 1966  Admit Date - 08/26/2021  Outpatient Primary MD for the patient is Renee Rival, FNP  Referring MD/NP/PA: Dr Melina Copa  Outpatient Specialists: oncology Dr Tera Helper  Patient coming from: home  No chief complaint on file.     HPI:    Derek Owen  is a 56 y.o. 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 Delton Coombes, but he is supposed to start oral chemotherapy CABOMETYX, as well as being a plan for immunotherapy. -Presents to ED today secondary to shortness of breath, patient reports progressive dyspnea over the last couple weeks, but much worse since last evening, as well he reports cough, productive with some white phlegm, he denies any chest pain, nausea, vomiting, fever, chills or hemoptysis, patient reports he did not receive his oral chemotherapy yet. - in ED he was noted to be dyspneic, with diminished air entry in the left lung, CTA chest negative for PE, but it was significant for bilateral pleural effusion, but much more significant on the left lung, and his dyspnea [facility] hospitalist consulted to admit.    Review of systems:    In addition to the HPI above,  A full 10 point Review of Systems was done, except as stated above, all other Review of Systems were negative.   With Past History of the following :    Past Medical History:  Diagnosis Date   Alcohol use disorder, mild, abuse    Cigarette smoker    Diabetes mellitus without complication ([place])    Hyperlipidemia    Hypertension    Polycythemia 04/13/2020   Stroke [facility])    x3      Past  Surgical History:  Procedure Laterality Date   PLEURAL BIOPSY Right 07/25/2021   Procedure: PLEURAL BIOPSY;  Surgeon: Gaye Pollack, MD;  Location: [place];  Service: Thoracic;  Laterality: Right;   PLEURAL EFFUSION DRAINAGE Right 07/25/2021   Procedure: DRAINAGE OF PLEURAL EFFUSION;  Surgeon: Gaye Pollack, MD;  Location: [place];  Service: Thoracic;  Laterality: Right;   VIDEO ASSISTED THORACOSCOPY (VATS)/EMPYEMA Right 07/25/2021   Procedure: VIDEO ASSISTED THORACOSCOPY (VATS)/EMPYEMA;  Surgeon: Gaye Pollack, MD;  Location: [facility] OR;  Service: Thoracic;  Laterality: Right;      Social History:     Social History   Tobacco Use   Smoking status: Every Day    Packs/day: 1.00    Types: Cigarettes   Smokeless tobacco: Never  Substance Use Topics   Alcohol use: Yes    Comment: Occasional     Family History :     Family History  Adopted:  Yes      Home Medications:   Prior to Admission medications   Medication Sig Start Date End Date Taking? Authorizing Provider  albuterol (VENTOLIN HFA) 108 (90 Base) MCG/ACT inhaler Inhale 1-2 puffs into the lungs every 4 (four) hours as needed for wheezing or shortness of breath. 0/2/54   Campbell Stall P, DO  amLODipine (NORVASC) 10 MG tablet Take 1 tablet (10 mg total) by mouth daily. 06/17/21   Fayrene Helper, MD  aspirin 81 MG chewable tablet Chew 81 mg by mouth daily.    [provider]  atorvastatin (LIPITOR) 40 MG tablet Take 1 tablet (40 mg total) by mouth daily. 06/17/21   Fayrene Helper, MD  cabozantinib (CABOMETYX) 40 MG tablet Take 1 tablet (40 mg total) by mouth daily. Take on an empty stomach, 1 hour before or 2 hours after meals. 08/23/21   Derek Jack, MD  folic acid (FOLVITE) 1 MG tablet Take 1 tablet (1 mg total) by mouth daily. 05/03/21   Collier Salina, MD  glipiZIDE (GLUCOTROL) 10 MG tablet Take 1 tablet (10 mg total) by mouth daily before breakfast. 06/17/21   Fayrene Helper, MD   losartan-hydrochlorothiazide (HYZAAR) 100-25 MG tablet Take 1 tablet by mouth daily. 06/17/21   Fayrene Helper, MD  methotrexate (RHEUMATREX) 2.5 MG tablet Take 10 tablets (25 mg total) by mouth once a week. 07/22/21   Rice, Resa Miner, MD  Pseudoephedrine-APAP-DM (DAYQUIL PO) Take 30 mLs by mouth 2 (two) times daily as needed (cold/cough). Patient not taking: Reported on 08/24/2021    [provider]  Semaglutide (RYBELSUS) 14 MG TABS Take 14 mg by mouth daily. 04/26/21   Brita Romp, NP     Allergies:    No Known Allergies   Physical Exam:   Vitals  Blood pressure (!) 134/96, pulse (!) 117, temperature 98.3 F (36.8 C), temperature source Oral, resp. rate (!) 23, SpO2 95 %.   1. General well obese male, sitting in bed in mild respiratory distress.  2. Normal affect and insight, Not Suicidal or Homicidal, Awake Alert, Oriented X 3.  3. No F.N deficits, ALL C.Nerves Intact, Strength 5/5 all 4 extremities, Sensation intact all 4 extremities, Plantars down going.  4. Ears and Eyes appear Normal, Conjunctivae clear, PERRLA. Moist Oral Mucosa.  5. Supple Neck, No JVD, No Carotid Bruits.  6. Symmetrical Chest wall movement, air entry, mainly in the left lung up to mid lung field   7.  Tachycardic, No Gallops, Rubs or Murmurs, No Parasternal Heave.  8. Positive Bowel Sounds, Abdomen Soft, No tenderness, No organomegaly appriciated,No rebound -guarding or rigidity.  9.  No Cyanosis, Normal Skin Turgor, No Skin Rash or Bruise.  10. Good muscle tone,  joints appear normal , no effusions, Normal ROM.     Data Review:    CBC Recent Labs  Lab 08/26/21 1610  WBC 7.1  HGB 10.9*  HCT 35.4*  PLT 224  MCV 76.6*  MCH 23.6*  MCHC 30.8  RDW 18.6*   ------------------------------------------------------------------------------------------------------------------  Chemistries  Recent Labs  Lab 08/26/21 1610  NA 137  K 3.3*  CL 98  CO2 23  GLUCOSE  209*  BUN 19  CREATININE 1.08  CALCIUM 9.0  AST 20  ALT 17  ALKPHOS 96  BILITOT 0.7   ------------------------------------------------------------------------------------------------------------------ estimated creatinine clearance is 109.9 mL/min (by C-G formula based on SCr of 1.08 mg/dL). ------------------------------------------------------------------------------------------------------------------ No results for input(s): TSH, T4TOTAL, T3FREE, THYROIDAB in the last 72  hours.  Invalid input(s): FREET3  Coagulation profile No results for input(s): INR, PROTIME in the last 168 hours. ------------------------------------------------------------------------------------------------------------------- No results for input(s): DDIMER in the last 72 hours. -------------------------------------------------------------------------------------------------------------------  Cardiac Enzymes No results for input(s): CKMB, TROPONINI, MYOGLOBIN in the last 168 hours.  Invalid input(s): CK ------------------------------------------------------------------------------------------------------------------    Component Value Date/Time   BNP 17.0 08/26/2021 1610     ---------------------------------------------------------------------------------------------------------------  Urinalysis    Component Value Date/Time   COLORURINE STRAW (A) 07/22/2021 2103   APPEARANCEUR CLEAR 07/22/2021 2103   LABSPEC 1.008 07/22/2021 2103   PHURINE 5.0 07/22/2021 2103   GLUCOSEU NEGATIVE 07/22/2021 2103   HGBUR NEGATIVE 07/22/2021 2103   BILIRUBINUR NEGATIVE 07/22/2021 2103   KETONESUR NEGATIVE 07/22/2021 2103   PROTEINUR NEGATIVE 07/22/2021 2103   NITRITE NEGATIVE 07/22/2021 2103   LEUKOCYTESUR NEGATIVE 07/22/2021 2103    ----------------------------------------------------------------------------------------------------------------   Imaging Results:    CT Angio Chest PE W/Cm &/Or Wo  Cm  Result Date: 08/26/2021 CLINICAL DATA:  Ongoing worsening shortness of breath, diabetes mellitus, hypertension, renal cancer, anemia, question pulmonary embolism EXAM: CT ANGIOGRAPHY CHEST WITH CONTRAST TECHNIQUE: Multidetector CT imaging of the chest was performed using the standard protocol during bolus administration of intravenous contrast. Multiplanar CT image reconstructions and MIPs were obtained to evaluate the vascular anatomy. RADIATION DOSE REDUCTION: This exam was performed according to the departmental dose-optimization program which includes automated exposure control, adjustment of the mA and/or kV according to patient size and/or use of iterative reconstruction technique. CONTRAST:  160mL OMNIPAQUE IOHEXOL 350 MG/ML SOLN IV COMPARISON:  CT chest abdomen pelvis 07/21/2021 FINDINGS: Cardiovascular: Aorta normal caliber without aneurysm or dissection. Minimal atherosclerotic calcifications aorta and coronary arteries. Minimal pericardial fluid or thickening. Heart otherwise unremarkable. Pulmonary arteries adequately opacified. Assessment of pulmonary arteries is limited by respiratory motion. No definite pulmonary emboli identified. Mediastinum/Nodes: Esophagus unremarkable. Several tiny nodules are seen within the thyroid lobes bilaterally, largest in 10 mm; no follow-up imaging recommended (ref: J Am Coll Radiol. 2015 Feb;12(2): 143-50). No thoracic adenopathy. Chronic stranding of fat at the RIGHT cardiophrenic angle with normal sized lymph node. Lungs/Pleura: Moderate LEFT pleural effusion. Small RIGHT pleural effusion, at least partially loculated. Pleural thickening and question slight irregularity/nodularity at the inferior RIGHT hemithorax, suspicious for carcinomatosis. Compressive atelectasis of BILATERAL lower lobes and lingula. No acute infiltrate or pneumothorax. Tiny calcified granuloma RIGHT upper lobe image 37. Mild paraseptal emphysema at RIGHT apex. Upper Abdomen: Large  peripherally calcified cystic lesion at the upper pole of the LEFT kidney, at least 10.9 x 10.2 cm, incompletely visualized. Fatty infiltration of liver. Remaining visualized upper abdomen unremarkable. Musculoskeletal: Cortical thickening and mild sclerosis of the posterior RIGHT ninth rib unchanged. No acute osseous findings. Review of the MIP images confirms the above findings. IMPRESSION: Significant limitations of exam secondary to respiratory motion. No gross large or central pulmonary emboli identified; unable to exclude small emboli by this exam. Moderate LEFT pleural effusion. Small RIGHT pleural effusion, appears at least partially loculated with areas of pleural thickening and question nodularity, cannot exclude pleural carcinomatosis. Compressive atelectasis in both lungs. Peripherally calcified mass at upper pole LEFT kidney at least 10.9 cm in diameter, incompletely visualized, seen on prior imaging. Fatty infiltration of liver. Aortic Atherosclerosis (ICD10-I70.0) and Emphysema (ICD10-J43.9). Electronically Signed   By: Lavonia Dana M.D.   On: 08/26/2021 18:12   DG Chest Port 1 View  Result Date: 08/26/2021 CLINICAL DATA:  Shortness of breath EXAM: PORTABLE CHEST 1 VIEW COMPARISON:  Previous studies including the  examination of 08/09/2021 FINDINGS: Cardiac size is unremarkable. Apparent shift of mediastinum to the right may be due to rotation. There are patchy infiltrates in the right parahilar region and right lower lung fields with no significant change. New small linear densities seen in left lower lung fields. Left hemidiaphragm is elevated. There is blunting of both lateral CP angles. There is no pneumothorax. IMPRESSION: Patchy infiltrates in right lower lung fields may suggest atelectasis/pneumonia. New linear densities in the left lower lung fields suggest subsegmental atelectasis. Small bilateral pleural effusions with interval increase in the left pleural effusion. Electronically Signed    By: Elmer Picker M.D.   On: 08/26/2021 16:39    My personal review of EKG: Sinus tachycardia at 123, and QTc of 487.     Assessment & Plan:    Principal Problem:   Recurrent pleural effusion Active Problems:   Cancer of kidney parenchyma, left (HCC)   Metastasis to lung (HCC)   Polyarthritis with positive rheumatoid factor (HCC)   Iron deficiency anemia   Hypertension associated with diabetes ([place])   Uncontrolled type 2 diabetes mellitus with hyperglycemia (HCC)   Morbid obesity (HCC)  Recurrent malignant pleural effusion -Your effusion appears to be very significant in the left lung, I have discussed with Dr. Delton Coombes, recommendation for one-time thoracentesis, so request has been put for radiology, no labs as needed as discussed with him as it is expected to be malignant pleural effusion. -Hopefully reaccumulation will improve after he starts on oral chemotherapy and immunotherapy.  Dyspnea -This is secondary to recurrent significant malignant pleural effusion -Likely due to infectious process, I will hold on IV antibiotics for now  Renal  metastatic disease to pleura -Regardless, patient supposed to start his oral chemotherapy CABOMETYX but apparently he did not receive it yet.  Leg edema -Will obtain venous Dopplers  hypertension -Continue with home medications amlodipine, and resume losartan/Hydrochlorothiazide tomorrow  Diabetes mellitus -Hold glipizide and semaglutide during hospital stay, and keep on insulin sliding scale.  Hyperlipidemia -Continue with statin  Report arthritis -Hold methotrexate during hospital stay.  Morbid obesity -BMI 41   DVT Prophylaxis  Lovenox   AM Labs Ordered, also please review Full Orders  Family Communication: Admission, patients condition and plan of care including tests being ordered have been discussed with the patient  who indicate understanding and agree with the plan and Code Status.  Code Status Full  Likely  DC to  home  Condition GUARDED    Consults called: D/W Dr Tera Helper by phone    Admission status: observation    Time spent in minutes : 72 minutes   Phillips Climes M.D on 08/26/2021 at 7:23 PM   [facility] - Office  [phone]

## 2021-08-26 NOTE — Progress Notes (Signed)
No change from previous vitals.

## 2021-08-26 NOTE — ED Notes (Signed)
Patient transported to CT 

## 2021-08-26 NOTE — Progress Notes (Signed)
Call received from patient stating that he is indeed ready to start treatment. Call placed back to patient to tell him that he can start Cabometyx when he receives it and we will need to see him two weeks after starting the pill. While talking with the patient he states that he just made an appt with him PCP to discuss shortness of breath. Patient states that he gets winded very badly walking short distances. Patient instructed to go to the ED.

## 2021-08-26 NOTE — ED Notes (Signed)
Dr Waldron Labs at bedside

## 2021-08-26 NOTE — ED Triage Notes (Signed)
Evaluation  of low hemoglobin

## 2021-08-26 NOTE — ED Provider Notes (Signed)
[facility] Provider Note   CSN: [id number] Arrival date & time: 08/26/21  1528     History  No chief complaint on file.   Derek Owen is a 56 y.o. male.  He has a history of metastatic renal cell cancer.  Complaining of shortness of breath its been worsening over the last few weeks but much worse since last evening.  Cough productive of some white sputum.  No chest pain nausea vomiting fevers or chills.  Shortness of breath worse with activity.  Improves with rest.  Continues to smoke.  Follows with Dr. Delton Coombes for his cancer  The history is provided by the patient.  Shortness of Breath Severity:  Moderate Onset quality:  Gradual Duration:  2 weeks Timing:  Intermittent Progression:  Worsening Chronicity:  New Context: activity   Relieved by:  Nothing Worsened by:  Activity Ineffective treatments:  Rest Associated symptoms: cough and sputum production   Associated symptoms: no abdominal pain, no chest pain, no fever, no headaches, no rash, no sore throat, no vomiting and no wheezing   Risk factors: hx of cancer and tobacco use       Home Medications Prior to Admission medications   Medication Sig Start Date End Date Taking? Authorizing Provider  albuterol (VENTOLIN HFA) 108 (90 Base) MCG/ACT inhaler Inhale 1-2 puffs into the lungs every 4 (four) hours as needed for wheezing or shortness of breath. 08/18/23   Campbell Stall P, DO  amLODipine (NORVASC) 10 MG tablet Take 1 tablet (10 mg total) by mouth daily. 06/17/21   Fayrene Helper, MD  aspirin 81 MG chewable tablet Chew 81 mg by mouth daily.    [provider]  atorvastatin (LIPITOR) 40 MG tablet Take 1 tablet (40 mg total) by mouth daily. 06/17/21   Fayrene Helper, MD  cabozantinib (CABOMETYX) 40 MG tablet Take 1 tablet (40 mg total) by mouth daily. Take on an empty stomach, 1 hour before or 2 hours after meals. 08/23/21   Derek Jack, MD  folic acid (FOLVITE) 1 MG tablet  Take 1 tablet (1 mg total) by mouth daily. 05/03/21   Collier Salina, MD  glipiZIDE (GLUCOTROL) 10 MG tablet Take 1 tablet (10 mg total) by mouth daily before breakfast. 06/17/21   Fayrene Helper, MD  losartan-hydrochlorothiazide (HYZAAR) 100-25 MG tablet Take 1 tablet by mouth daily. 06/17/21   Fayrene Helper, MD  methotrexate (RHEUMATREX) 2.5 MG tablet Take 10 tablets (25 mg total) by mouth once a week. 07/22/21   Rice, Resa Miner, MD  Pseudoephedrine-APAP-DM (DAYQUIL PO) Take 30 mLs by mouth 2 (two) times daily as needed (cold/cough). Patient not taking: Reported on 08/24/2021    [provider]  Semaglutide (RYBELSUS) 14 MG TABS Take 14 mg by mouth daily. 04/26/21   Brita Romp, NP      Allergies    Patient has no known allergies.    Review of Systems   Review of Systems  Constitutional:  Negative for fever.  HENT:  Negative for sore throat.   Eyes:  Negative for visual disturbance.  Respiratory:  Positive for cough, sputum production and shortness of breath. Negative for wheezing.   Cardiovascular:  Negative for chest pain.  Gastrointestinal:  Negative for abdominal pain and vomiting.  Genitourinary:  Negative for dysuria.  Musculoskeletal:  Positive for arthralgias.  Skin:  Negative for rash.  Neurological:  Negative for headaches.   Physical Exam Updated Vital Signs BP (!) 149/119 (BP Location:  Left Arm)    Pulse (!) 123    Temp 98.3 F (36.8 C) (Oral)    Resp 20    SpO2 97%  Physical Exam Vitals and nursing note reviewed.  Constitutional:      General: He is not in acute distress.    Appearance: Normal appearance. He is well-developed. He is obese.  HENT:     Head: Normocephalic and atraumatic.  Eyes:     Conjunctiva/sclera: Conjunctivae normal.  Cardiovascular:     Rate and Rhythm: Regular rhythm. Tachycardia present.     Heart sounds: No murmur heard. Pulmonary:     Effort: Tachypnea and accessory muscle usage present. No respiratory  distress.     Breath sounds: Normal breath sounds.  Abdominal:     Palpations: Abdomen is soft.     Tenderness: There is no abdominal tenderness. There is no guarding or rebound.  Musculoskeletal:        General: No swelling or tenderness.     Cervical back: Neck supple.     Right lower leg: Edema present.     Left lower leg: Edema present.  Skin:    General: Skin is warm and dry.     Capillary Refill: Capillary refill takes less than 2 seconds.  Neurological:     General: No focal deficit present.     Mental Status: He is alert.    ED Results / Procedures / Treatments   Labs (all labs ordered are listed, but only abnormal results are displayed) Labs Reviewed  COMPREHENSIVE METABOLIC PANEL - Abnormal; Notable for the following components:      Result Value   Potassium 3.3 (*)    Glucose, Bld 209 (*)    Anion gap 16 (*)    All other components within normal limits  CBC - Abnormal; Notable for the following components:   Hemoglobin 10.9 (*)    HCT 35.4 (*)    MCV 76.6 (*)    MCH 23.6 (*)    RDW 18.6 (*)    All other components within normal limits  LACTIC ACID, PLASMA - Abnormal; Notable for the following components:   Lactic Acid, Venous 2.5 (*)    All other components within normal limits  LACTIC ACID, PLASMA - Abnormal; Notable for the following components:   Lactic Acid, Venous 2.8 (*)    All other components within normal limits  BASIC METABOLIC PANEL - Abnormal; Notable for the following components:   Glucose, Bld 212 (*)    Calcium 8.7 (*)    All other components within normal limits  CBC - Abnormal; Notable for the following components:   RBC 3.89 (*)    Hemoglobin 9.3 (*)    HCT 30.4 (*)    MCV 78.1 (*)    MCH 23.9 (*)    RDW 18.5 (*)    All other components within normal limits  GLUCOSE, CAPILLARY - Abnormal; Notable for the following components:   Glucose-Capillary 204 (*)    All other components within normal limits  GLUCOSE, CAPILLARY - Abnormal;  Notable for the following components:   Glucose-Capillary 231 (*)    All other components within normal limits  RESP PANEL BY RT-PCR (FLU A&B, COVID) ARPGX2  CULTURE, BLOOD (ROUTINE X 2)  CULTURE, BLOOD (ROUTINE X 2)  BRAIN NATRIURETIC PEPTIDE  POC OCCULT BLOOD, ED  TYPE AND SCREEN    EKG EKG Interpretation  Date/Time:  Thursday August 26 2021 16:32:12 EST Ventricular Rate:  123 PR Interval:  122  QRS Duration: 88 QT Interval:  340 QTC Calculation: 487 R Axis:   18 Text Interpretation: Sinus tachycardia Low voltage, precordial leads Abnormal R-wave progression, early transition Borderline prolonged QT interval No significant change since prior 1/23 Confirmed by Aletta Edouard [id number]) on 08/26/2021 4:39:00 PM  Radiology CT Angio Chest PE W/Cm &/Or Wo Cm  Result Date: 08/26/2021 CLINICAL DATA:  Ongoing worsening shortness of breath, diabetes mellitus, hypertension, renal cancer, anemia, question pulmonary embolism EXAM: CT ANGIOGRAPHY CHEST WITH CONTRAST TECHNIQUE: Multidetector CT imaging of the chest was performed using the standard protocol during bolus administration of intravenous contrast. Multiplanar CT image reconstructions and MIPs were obtained to evaluate the vascular anatomy. RADIATION DOSE REDUCTION: This exam was performed according to the departmental dose-optimization program which includes automated exposure control, adjustment of the mA and/or kV according to patient size and/or use of iterative reconstruction technique. CONTRAST:  163mL OMNIPAQUE IOHEXOL 350 MG/ML SOLN IV COMPARISON:  CT chest abdomen pelvis 07/21/2021 FINDINGS: Cardiovascular: Aorta normal caliber without aneurysm or dissection. Minimal atherosclerotic calcifications aorta and coronary arteries. Minimal pericardial fluid or thickening. Heart otherwise unremarkable. Pulmonary arteries adequately opacified. Assessment of pulmonary arteries is limited by respiratory motion. No definite pulmonary emboli  identified. Mediastinum/Nodes: Esophagus unremarkable. Several tiny nodules are seen within the thyroid lobes bilaterally, largest in 10 mm; no follow-up imaging recommended (ref: J Am Coll Radiol. 2015 Feb;12(2): 143-50). No thoracic adenopathy. Chronic stranding of fat at the RIGHT cardiophrenic angle with normal sized lymph node. Lungs/Pleura: Moderate LEFT pleural effusion. Small RIGHT pleural effusion, at least partially loculated. Pleural thickening and question slight irregularity/nodularity at the inferior RIGHT hemithorax, suspicious for carcinomatosis. Compressive atelectasis of BILATERAL lower lobes and lingula. No acute infiltrate or pneumothorax. Tiny calcified granuloma RIGHT upper lobe image 37. Mild paraseptal emphysema at RIGHT apex. Upper Abdomen: Large peripherally calcified cystic lesion at the upper pole of the LEFT kidney, at least 10.9 x 10.2 cm, incompletely visualized. Fatty infiltration of liver. Remaining visualized upper abdomen unremarkable. Musculoskeletal: Cortical thickening and mild sclerosis of the posterior RIGHT ninth rib unchanged. No acute osseous findings. Review of the MIP images confirms the above findings. IMPRESSION: Significant limitations of exam secondary to respiratory motion. No gross large or central pulmonary emboli identified; unable to exclude small emboli by this exam. Moderate LEFT pleural effusion. Small RIGHT pleural effusion, appears at least partially loculated with areas of pleural thickening and question nodularity, cannot exclude pleural carcinomatosis. Compressive atelectasis in both lungs. Peripherally calcified mass at upper pole LEFT kidney at least 10.9 cm in diameter, incompletely visualized, seen on prior imaging. Fatty infiltration of liver. Aortic Atherosclerosis (ICD10-I70.0) and Emphysema (ICD10-J43.9). Electronically Signed   By: Lavonia Dana M.D.   On: 08/26/2021 18:12   DG Chest Port 1 View  Result Date: 08/26/2021 CLINICAL DATA:   Shortness of breath EXAM: PORTABLE CHEST 1 VIEW COMPARISON:  Previous studies including the examination of 08/09/2021 FINDINGS: Cardiac size is unremarkable. Apparent shift of mediastinum to the right may be due to rotation. There are patchy infiltrates in the right parahilar region and right lower lung fields with no significant change. New small linear densities seen in left lower lung fields. Left hemidiaphragm is elevated. There is blunting of both lateral CP angles. There is no pneumothorax. IMPRESSION: Patchy infiltrates in right lower lung fields may suggest atelectasis/pneumonia. New linear densities in the left lower lung fields suggest subsegmental atelectasis. Small bilateral pleural effusions with interval increase in the left pleural effusion. Electronically Signed   By: Royston Cowper  Rathinasamy M.D.   On: 08/26/2021 16:39    Procedures Procedures    Medications Ordered in ED Medications  folic acid (FOLVITE) tablet 1 mg (1 mg Oral Given 08/27/21 0843)  enoxaparin (LOVENOX) injection 70 mg (has no administration in time range)  acetaminophen (TYLENOL) tablet 650 mg (has no administration in time range)    Or  acetaminophen (TYLENOL) suppository 650 mg (has no administration in time range)  albuterol (PROVENTIL) (2.5 MG/3ML) 0.083% nebulizer solution 2.5 mg (2.5 mg Nebulization Given 08/27/21 0223)  insulin aspart (novoLOG) injection 0-15 Units (3 Units Subcutaneous Given 08/27/21 0844)  insulin aspart (novoLOG) injection 0-5 Units (5 Units Subcutaneous Given 08/27/21 0843)  amLODipine (NORVASC) tablet 10 mg (10 mg Oral Given 08/27/21 0843)  atorvastatin (LIPITOR) tablet 40 mg (40 mg Oral Given 08/27/21 0843)  aspirin chewable tablet 81 mg (81 mg Oral Given 08/27/21 0846)  lidocaine (PF) (XYLOCAINE) 1 % injection (has no administration in time range)  cefTRIAXone (ROCEPHIN) 1 g in sodium chloride 0.9 % 100 mL IVPB (0 g Intravenous Stopped 08/26/21 1847)  azithromycin (ZITHROMAX) 500 mg in sodium  chloride 0.9 % 250 mL IVPB (500 mg Intravenous New Bag/Given 08/26/21 1850)  sodium chloride 0.9 % bolus 500 mL (0 mLs Intravenous Stopped 08/26/21 1847)  iohexol (OMNIPAQUE) 350 MG/ML injection 100 mL (100 mLs Intravenous Contrast Given 08/26/21 1734)    ED Course/ Medical Decision Making/ A&P Clinical Course as of 08/27/21 1043  Thu Aug 26, 2021  1636 Chest x-ray interpreted by me as moderate left-sided pleural effusion.  Awaiting radiology reading. [MB]  0981 CT does not show any acute PE.  He does have a moderate effusion on the left and small on the right.  Awaiting radiology reading. [MB]  1914 Discussed with Dr. [facility] hospitalist who will evaluate the patient for admission. [MB]    Clinical Course User Index [MB] Hayden Rasmussen, MD                           Medical Decision Making Amount and/or Complexity of Data Reviewed Labs: ordered. Radiology: ordered.  Risk Prescription drug management. Decision regarding hospitalization.  JAZE RODINO was evaluated in Emergency Department on 08/26/2021 for the symptoms described in the history of present illness. He was evaluated in the context of the global COVID-19 pandemic, which necessitated consideration that the patient might be at risk for infection with the SARS-CoV-2 virus that causes COVID-19. Institutional protocols and algorithms that pertain to the evaluation of patients at risk for COVID-19 are in a state of rapid change based on information released by regulatory bodies including the CDC and federal and state organizations. These policies and algorithms were followed during the patient's care in the ED. This patient complains of increased shortness of breath dyspnea on exertion; this involves an extensive number of treatment Options and is a complaint that carries with it a high risk of complications and morbidity. The differential includes pleural effusion reaccumulation, pneumonia, PE, anemia, arrhythmia,  sepsis  I ordered, reviewed and interpreted labs, which included CBC with normal white count, hemoglobin low slightly better than priors, chemistries with mildly low potassium elevated glucose, BMP normal, blood culture sent lactate elevated, COVID and flu normal I ordered medication antibiotics and fluids for possible pneumonia and reviewed PMP when indicated. I ordered imaging studies which included chest x-ray and CT angio chest and I independently    visualized and interpreted imaging which showed pleural effusions left  greater than right, no PE Previous records obtained and reviewed in epic, patient admitted in January for similar presentation and had thoracentesis and VATS I consulted Dr. [facility] hospitalist and discussed lab and imaging findings and discussed disposition.  Cardiac monitoring reviewed, sinus tachycardia Social determinants considered, ongoing tobacco use Critical Interventions: None  After the interventions stated above, I reevaluated the patient and found patient still to be symptomatic.  Tachypneic tachycardic Admission and further testing considered, patient will need to be admitted to the hospital for likely thoracentesis.  Coverage with antibiotics for possible pneumonia.  Discussed with Dr. [facility] hospitalist will evaluate for admission.  Patient in agreement with plan.          Final Clinical Impression(s) / ED Diagnoses Final diagnoses:  Dyspnea on exertion  Pleural effusion, bilateral  Renal cell carcinoma, unspecified laterality [facility])    Rx / DC Orders ED Discharge Orders     None         Hayden Rasmussen, MD 08/27/21 1049

## 2021-08-27 ENCOUNTER — Ambulatory Visit: Payer: Managed Care, Other (non HMO) | Admitting: Nurse Practitioner

## 2021-08-27 ENCOUNTER — Observation Stay (HOSPITAL_COMMUNITY): Payer: Managed Care, Other (non HMO)

## 2021-08-27 ENCOUNTER — Encounter (HOSPITAL_COMMUNITY): Payer: Self-pay | Admitting: Internal Medicine

## 2021-08-27 DIAGNOSIS — C642 Malignant neoplasm of left kidney, except renal pelvis: Secondary | ICD-10-CM | POA: Diagnosis not present

## 2021-08-27 DIAGNOSIS — E1159 Type 2 diabetes mellitus with other circulatory complications: Secondary | ICD-10-CM | POA: Diagnosis not present

## 2021-08-27 DIAGNOSIS — J90 Pleural effusion, not elsewhere classified: Secondary | ICD-10-CM | POA: Diagnosis not present

## 2021-08-27 DIAGNOSIS — J910 Malignant pleural effusion: Secondary | ICD-10-CM

## 2021-08-27 DIAGNOSIS — I152 Hypertension secondary to endocrine disorders: Secondary | ICD-10-CM

## 2021-08-27 DIAGNOSIS — R0609 Other forms of dyspnea: Secondary | ICD-10-CM | POA: Diagnosis not present

## 2021-08-27 LAB — CBC
HCT: 30.4 % — ABNORMAL LOW (ref 39.0–52.0)
Hemoglobin: 9.3 g/dL — ABNORMAL LOW (ref 13.0–17.0)
MCH: 23.9 pg — ABNORMAL LOW (ref 26.0–34.0)
MCHC: 30.6 g/dL (ref 30.0–36.0)
MCV: 78.1 fL — ABNORMAL LOW (ref 80.0–100.0)
Platelets: 214 10*3/uL (ref 150–400)
RBC: 3.89 MIL/uL — ABNORMAL LOW (ref 4.22–5.81)
RDW: 18.5 % — ABNORMAL HIGH (ref 11.5–15.5)
WBC: 6.3 10*3/uL (ref 4.0–10.5)
nRBC: 0 % (ref 0.0–0.2)

## 2021-08-27 LAB — BASIC METABOLIC PANEL
Anion gap: 12 (ref 5–15)
BUN: 17 mg/dL (ref 6–20)
CO2: 24 mmol/L (ref 22–32)
Calcium: 8.7 mg/dL — ABNORMAL LOW (ref 8.9–10.3)
Chloride: 103 mmol/L (ref 98–111)
Creatinine, Ser: 0.97 mg/dL (ref 0.61–1.24)
GFR, Estimated: >60 mL/min (ref >60)
Glucose, Bld: 212 mg/dL — ABNORMAL HIGH (ref 70–99)
Potassium: 3.5 mmol/L (ref 3.5–5.1)
Sodium: 139 mmol/L (ref 135–145)

## 2021-08-27 LAB — GLUCOSE, CAPILLARY
Glucose-Capillary: 231 mg/dL — ABNORMAL HIGH (ref 70–99)
Glucose-Capillary: 320 mg/dL — ABNORMAL HIGH (ref 70–99)
Glucose-Capillary: 253 mg/dL — ABNORMAL HIGH (ref 70–99)

## 2021-08-27 MED ORDER — ASPIRIN 81 MG PO CHEW
81.0000 mg | CHEWABLE_TABLET | Freq: Every day | ORAL | Status: DC
  Administered 2021-08-27: 81 mg via ORAL

## 2021-08-27 MED ORDER — LIDOCAINE HCL (PF) 1 % IJ SOLN
INTRAMUSCULAR | Status: DC
Start: 2021-08-27 — End: 2021-08-27
  Filled 2021-08-27: qty 10

## 2021-08-27 NOTE — Discharge Instructions (Signed)
IMPORTANT INFORMATION: PAY CLOSE ATTENTION  ? ?PHYSICIAN DISCHARGE INSTRUCTIONS ? ?Follow with Primary care provider  Paseda, Folashade R, FNP  and other consultants as instructed by your Hospitalist Physician ? ?SEEK MEDICAL CARE OR RETURN TO EMERGENCY ROOM IF SYMPTOMS COME BACK, WORSEN OR NEW PROBLEM DEVELOPS  ? ?Please note: ?You were cared for by a hospitalist during your hospital stay. Every effort will be made to forward records to your primary care provider.  You can request that your primary care provider send for your hospital records if they have not received them.  Once you are discharged, your primary care physician will handle any further medical issues. Please note that NO REFILLS for any discharge medications will be authorized once you are discharged, as it is imperative that you return to your primary care physician (or establish a relationship with a primary care physician if you do not have one) for your post hospital discharge needs so that they can reassess your need for medications and monitor your lab values. ? ?Please get a complete blood count and chemistry panel checked by your Primary MD at your next visit, and again as instructed by your Primary MD. ? ?Get Medicines reviewed and adjusted: ?Please take all your medications with you for your next visit with your Primary MD ? ?Laboratory/radiological data: ?Please request your Primary MD to go over all hospital tests and procedure/radiological results at the follow up, please ask your primary care provider to get all Hospital records sent to his/her office. ? ?In some cases, they will be blood work, cultures and biopsy results pending at the time of your discharge. Please request that your primary care provider follow up on these results. ? ?If you are diabetic, please bring your blood sugar readings with you to your follow up appointment with primary care.   ? ?Please call and make your follow up appointments as soon as possible.   ? ?Also  Note the following: ?If you experience worsening of your admission symptoms, develop shortness of breath, life threatening emergency, suicidal or homicidal thoughts you must seek medical attention immediately by calling 911 or calling your MD immediately  if symptoms less severe. ? ?You must read complete instructions/literature along with all the possible adverse reactions/side effects for all the Medicines you take and that have been prescribed to you. Take any new Medicines after you have completely understood and accpet all the possible adverse reactions/side effects.  ? ?Do not drive when taking Pain medications or sleeping medications (Benzodiazepines) ? ?Do not take more than prescribed Pain, Sleep and Anxiety Medications. It is not advisable to combine anxiety,sleep and pain medications without talking with your primary care practitioner ? ?Special Instructions: If you have smoked or chewed Tobacco  in the last 2 yrs please stop smoking, stop any regular Alcohol  and or any Recreational drug use. ? ?Wear Seat belts while driving.  Do not drive if taking any narcotic, mind altering or controlled substances or recreational drugs or alcohol.  ? ? ? ? ? ?

## 2021-08-27 NOTE — Progress Notes (Signed)
SATURATION QUALIFICATIONS: (This note is used to comply with regulatory documentation for home oxygen)  Patient Saturations on Room Air at Rest = 98%  Patient Saturations on Room Air while Ambulating = 92%  Patient Saturations on 3 Liters of oxygen while Ambulating = 100%  Please briefly explain why patient needs home oxygen: pt does not qualify for at home oxygen at this time.

## 2021-08-27 NOTE — Progress Notes (Signed)
PT tolerated left sided thoracentesis procedure well today and 1.1 Liters of dark old bloody colored fluid removed. Pt verbalized understanding of post procedure instructions and taken back to inpatient bed assignment at this time via stretcher with no acute distress noted.

## 2021-08-27 NOTE — Discharge Summary (Signed)
Physician Discharge Summary   Patient: Derek Owen MRN: [id number] DOB: [birth year]  Admit date:     08/26/2021  Discharge date: 08/27/21  Discharge Physician: Irwin Brakeman   PCP: Renee Rival, FNP   Recommendations at discharge:   Follow up with oncologist in 3-5 days Follow up with Dr. Cyndia Bent as scheduled Follow up with Dr. Elsworth Soho as scheduled  Discharge Diagnoses: Principal Problem:   Recurrent pleural effusion Active Problems:   Polyarthritis with positive rheumatoid factor ([place])   Uncontrolled type 2 diabetes mellitus with hyperglycemia ([place])   Hypertension associated with diabetes ([place])   Morbid obesity ([place])   Iron deficiency anemia   Cancer of kidney parenchyma, left ([place])   Metastasis to lung [facility])  Resolved Problems:   * No resolved hospital problems. *   Hospital Course:  56 y.o. 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 Delton Coombes, but he is supposed to start oral chemotherapy CABOMETYX, as well as being a plan for immunotherapy. -Presents to ED today secondary to shortness of breath, patient reports progressive dyspnea over the last couple weeks, but much worse since last evening, as well he reports cough, productive with some white phlegm, he denies any chest pain, nausea, vomiting, fever, chills or hemoptysis, patient reports he did not receive his oral chemotherapy yet. - in ED he was noted to be dyspneic, with diminished air entry in the left lung, CTA chest negative for PE, but it was significant for bilateral pleural effusion, but much more significant on the left lung, and his dyspnea [facility] consulted to admit.    Assessment and Plan:  Recurrent malignant pleural effusion -the effusion appears to be very significant in the left  lung, I have discussed with Dr. Delton Coombes, recommendation for one-time thoracentesis, so request has been put for radiology, no labs as needed as discussed with him as it is expected to be malignant pleural effusion. -Hopefully reaccumulation will improve after he starts on oral chemotherapy and immunotherapy. -He had US thoracentesis with Dr. Thornton Papas on 2/17 and 1.1L of fluid was removed.  -He was strongly advised to follow up with oncology and pulmonology and CT surgery Dr. Cyndia Bent.  -He is stable to safely discharge home.    Dyspnea -This is secondary to recurrent significant malignant pleural effusion -s/p thoracentesis with 1.1L fluid removed.  - Home oxygen eval ordered prior to discharge.  Home oxygen will be ordered if qualified.    Renal  metastatic disease to pleura -Regardless, patient supposed to start his oral chemotherapy CABOMETYX but apparently he did not receive it yet. -medication was approved by insurance and should receive soon to begin taking.  - follow up with oncology in 3-5 days   Leg edema -Will obtain venous Dopplers: Duplex studies NEG for DVT on 08/27/21   hypertension -Continue with home medications amlodipine, and resume losartan/Hydrochlorothiazide    Diabetes mellitus -Resume home glipizide and semaglutide    Hyperlipidemia -Continue with statin   Report arthritis -resume home treatment at discharge    Morbid obesity -BMI 41        Consultants: Radiology Dr. Thornton Papas  Procedures performed: US thoracentesis left  Disposition: Home Diet recommendation:  Carb modified diet  DISCHARGE MEDICATION: Allergies as of 08/27/2021   No Known Allergies      Medication List     TAKE  these medications    albuterol 108 (90 Base) MCG/ACT inhaler Commonly known as: VENTOLIN HFA Inhale 1-2 puffs into the lungs every 4 (four) hours as needed for wheezing or shortness of breath.   amLODipine 10 MG tablet Commonly known as: NORVASC Take 1 tablet (10 mg  total) by mouth daily.   aspirin 81 MG chewable tablet Chew 81 mg by mouth daily.   atorvastatin 40 MG tablet Commonly known as: LIPITOR Take 1 tablet (40 mg total) by mouth daily.   Cabometyx 40 MG tablet Generic drug: cabozantinib Take 1 tablet (40 mg total) by mouth daily. Take on an empty stomach, 1 hour before or 2 hours after meals.   folic acid 1 MG tablet Commonly known as: FOLVITE Take 1 tablet (1 mg total) by mouth daily.   glipiZIDE 10 MG tablet Commonly known as: GLUCOTROL Take 1 tablet (10 mg total) by mouth daily before breakfast.   losartan-hydrochlorothiazide 100-25 MG tablet Commonly known as: HYZAAR Take 1 tablet by mouth daily.   methotrexate 2.5 MG tablet Commonly known as: RHEUMATREX Take 10 tablets (25 mg total) by mouth once a week.   Rybelsus 14 MG Tabs Generic drug: Semaglutide Take 14 mg by mouth daily.        Follow-up Information     Derek Jack, MD. Schedule an appointment as soon as possible for a visit in 5 day(s).   Specialty: Hematology Why: Hospital Follow Up Contact information: [address] [place] [address] [phone]         Gaye Pollack, MD. Go on 10/13/2021.   Specialty: Cardiothoracic Surgery Why: as scheduled Contact information: [address] [place] [address] [phone]         Rigoberto Noel, MD. Go to.   Specialty: Pulmonary Disease Why: scheduled next appointment Contact information: [place] [address] [phone]                 Discharge Exam: Danley Danker Weights   08/26/21 2014  Weight: (!) 137.5 kg   Physical Exam Vitals and nursing note reviewed.  Constitutional:      Appearance: Normal appearance.  HENT:     Head: Normocephalic.     Nose: No rhinorrhea.     Mouth/Throat:     Mouth: Mucous membranes are moist.     Pharynx: Oropharynx is clear.  Eyes:     Pupils: Pupils are equal, round, and reactive to light.   Cardiovascular:     Rate and Rhythm: Normal rate and regular rhythm.  Pulmonary:     Effort: Pulmonary effort is normal. No respiratory distress.     Breath sounds: No stridor. No rales.  Chest:     Chest wall: No tenderness.  Abdominal:     General: Abdomen is flat. Bowel sounds are normal.     Palpations: Abdomen is soft.  Musculoskeletal:        General: Normal range of motion.     Cervical back: Normal range of motion.  Skin:    General: Skin is warm and dry.  Neurological:     Mental Status: He is alert.  Psychiatric:        Mood and Affect: Mood normal.     Condition at discharge: stable  The results of significant diagnostics from this hospitalization (including imaging, microbiology, ancillary and laboratory) are listed below for reference.   Imaging Studies: DG Chest 1 View  Result Date: 08/27/2021 CLINICAL DATA:  Malignant LEFT pleural effusion post thoracentesis EXAM: CHEST  1 VIEW COMPARISON:  08/26/2021 FINDINGS: Enlargement of cardiac silhouette. Mediastinal contours normal. Bibasilar effusions and atelectasis greater on LEFT, accentuated by expiratory technique. No pneumothorax following LEFT thoracentesis. Osseous structures unremarkable. IMPRESSION: No pneumothorax following LEFT thoracentesis. Residual bibasilar effusions and atelectasis accentuated by expiratory technique. Electronically Signed   By: Lavonia Dana M.D.   On: 08/27/2021 12:22   DG Chest 2 View  Result Date: 08/09/2021 CLINICAL DATA:  Follow-up malignant pleural effusion. EXAM: CHEST - 2 VIEW COMPARISON:  07/28/2021 FINDINGS: Small right pleural effusion without significant change. Small residual right pneumothorax, with an interval mild decrease in size and partially filled in with pleural fluid. Increased linear and ill-defined density at the right lung base. Clear left lung. Mildly enlarged cardiac silhouette with an interval decrease in size. Thoracic spine degenerative changes. IMPRESSION: 1.  Interval decrease in size of a small right pneumothorax, partially filled with fluid in the interim. 2. No gross change in size of a small right pleural effusion. 3. Increased right basilar atelectasis. 4. Mild cardiomegaly with improvement. Electronically Signed   By: Claudie Revering M.D.   On: 08/09/2021 09:26   CT Angio Chest PE W/Cm &/Or Wo Cm  Result Date: 08/26/2021 CLINICAL DATA:  Ongoing worsening shortness of breath, diabetes mellitus, hypertension, renal cancer, anemia, question pulmonary embolism EXAM: CT ANGIOGRAPHY CHEST WITH CONTRAST TECHNIQUE: Multidetector CT imaging of the chest was performed using the standard protocol during bolus administration of intravenous contrast. Multiplanar CT image reconstructions and MIPs were obtained to evaluate the vascular anatomy. RADIATION DOSE REDUCTION: This exam was performed according to the departmental dose-optimization program which includes automated exposure control, adjustment of the mA and/or kV according to patient size and/or use of iterative reconstruction technique. CONTRAST:  131mL OMNIPAQUE IOHEXOL 350 MG/ML SOLN IV COMPARISON:  CT chest abdomen pelvis 07/21/2021 FINDINGS: Cardiovascular: Aorta normal caliber without aneurysm or dissection. Minimal atherosclerotic calcifications aorta and coronary arteries. Minimal pericardial fluid or thickening. Heart otherwise unremarkable. Pulmonary arteries adequately opacified. Assessment of pulmonary arteries is limited by respiratory motion. No definite pulmonary emboli identified. Mediastinum/Nodes: Esophagus unremarkable. Several tiny nodules are seen within the thyroid lobes bilaterally, largest in 10 mm; no follow-up imaging recommended (ref: J Am Coll Radiol. 2015 Feb;12(2): 143-50). No thoracic adenopathy. Chronic stranding of fat at the RIGHT cardiophrenic angle with normal sized lymph node. Lungs/Pleura: Moderate LEFT pleural effusion. Small RIGHT pleural effusion, at least partially loculated.  Pleural thickening and question slight irregularity/nodularity at the inferior RIGHT hemithorax, suspicious for carcinomatosis. Compressive atelectasis of BILATERAL lower lobes and lingula. No acute infiltrate or pneumothorax. Tiny calcified granuloma RIGHT upper lobe image 37. Mild paraseptal emphysema at RIGHT apex. Upper Abdomen: Large peripherally calcified cystic lesion at the upper pole of the LEFT kidney, at least 10.9 x 10.2 cm, incompletely visualized. Fatty infiltration of liver. Remaining visualized upper abdomen unremarkable. Musculoskeletal: Cortical thickening and mild sclerosis of the posterior RIGHT ninth rib unchanged. No acute osseous findings. Review of the MIP images confirms the above findings. IMPRESSION: Significant limitations of exam secondary to respiratory motion. No gross large or central pulmonary emboli identified; unable to exclude small emboli by this exam. Moderate LEFT pleural effusion. Small RIGHT pleural effusion, appears at least partially loculated with areas of pleural thickening and question nodularity, cannot exclude pleural carcinomatosis. Compressive atelectasis in both lungs. Peripherally calcified mass at upper pole LEFT kidney at least 10.9 cm in diameter, incompletely visualized, seen on prior imaging. Fatty infiltration  of liver. Aortic Atherosclerosis (ICD10-I70.0) and Emphysema (ICD10-J43.9). Electronically Signed   By: Lavonia Dana M.D.   On: 08/26/2021 18:12   NM Bone Scan Whole Body  Result Date: 08/15/2021 CLINICAL DATA:  Renal cancer staging. EXAM: NUCLEAR MEDICINE WHOLE BODY BONE SCAN TECHNIQUE: Whole body anterior and posterior images were obtained approximately 3 hours after intravenous injection of radiopharmaceutical. RADIOPHARMACEUTICALS:  20.1 mCi Technetium-34m MDP IV COMPARISON:  CT scan of the chest July 21, 2021 FINDINGS: Degenerative changes in the feet, knees, AC joints, wrists, and thoracic spine. Mild uptake in multiple adjacent medial right  ribs posteriorly correlates with degenerative changes at multiple costovertebral junctions. A single focus of uptake is seen in a posterior right rib. There is subtle sclerosis in this rib on series 4, image 102 of the comparison CT. The uptake and subtle sclerosis likely correlate. No other abnormal bony uptake. Soft tissue uptake is unremarkable. IMPRESSION: 1. There is a single focus of indeterminate uptake in a right posterior rib thought to correlate with subtle sclerosis in the right posterior ninth rib on recent CT imaging of the chest. The finding is not specific. An early metastatic lesion is not excluded. Recommend attention on follow-up. 2. Degenerative changes as above. Electronically Signed   By: Dorise Bullion III M.D.   On: 08/15/2021 15:42   US Venous Img Lower Bilateral (DVT)  Result Date: 08/27/2021 CLINICAL DATA:  56 year old male with a history of edema EXAM: BILATERAL LOWER EXTREMITY VENOUS DOPPLER ULTRASOUND TECHNIQUE: Gray-scale sonography with graded compression, as well as color Doppler and duplex ultrasound were performed to evaluate the lower extremity deep venous systems from the level of the common femoral vein and including the common femoral, femoral, profunda femoral, popliteal and calf veins including the posterior tibial, peroneal and gastrocnemius veins when visible. The superficial great saphenous vein was also interrogated. Spectral Doppler was utilized to evaluate flow at rest and with distal augmentation maneuvers in the common femoral, femoral and popliteal veins. COMPARISON:  None. FINDINGS: RIGHT LOWER EXTREMITY Common Femoral Vein: No evidence of thrombus. Normal compressibility, respiratory phasicity and response to augmentation. Saphenofemoral Junction: No evidence of thrombus. Normal compressibility and flow on color Doppler imaging. Profunda Femoral Vein: No evidence of thrombus. Normal compressibility and flow on color Doppler imaging. Femoral Vein: No evidence of  thrombus. Normal compressibility, respiratory phasicity and response to augmentation. Popliteal Vein: No evidence of thrombus. Normal compressibility, respiratory phasicity and response to augmentation. Calf Veins: Posterior tibial vein patent and compressible. Limited visualization of the peroneal vein. Superficial Great Saphenous Vein: No evidence of thrombus. Normal compressibility and flow on color Doppler imaging. Other Findings:  Edema LEFT LOWER EXTREMITY Common Femoral Vein: No evidence of thrombus. Normal compressibility, respiratory phasicity and response to augmentation. Saphenofemoral Junction: No evidence of thrombus. Normal compressibility and flow on color Doppler imaging. Profunda Femoral Vein: No evidence of thrombus. Normal compressibility and flow on color Doppler imaging. Femoral Vein: No evidence of thrombus. Normal compressibility, respiratory phasicity and response to augmentation. Popliteal Vein: No evidence of thrombus. Normal compressibility, respiratory phasicity and response to augmentation. Calf Veins: Swelling somewhat limits evaluation of the tibial veins. Posterior tibial veins demonstrate maintained flow. Superficial Great Saphenous Vein: No evidence of thrombus. Normal compressibility and flow on color Doppler imaging. Other Findings:  Edema IMPRESSION: Directed duplex of the bilateral lower extremity negative for DVT. Edema bilaterally Electronically Signed   By: Corrie Mckusick D.O.   On: 08/27/2021 12:13   DG Chest [facility] 1 View  Result  Date: 08/26/2021 CLINICAL DATA:  Shortness of breath EXAM: PORTABLE CHEST 1 VIEW COMPARISON:  Previous studies including the examination of 08/09/2021 FINDINGS: Cardiac size is unremarkable. Apparent shift of mediastinum to the right may be due to rotation. There are patchy infiltrates in the right parahilar region and right lower lung fields with no significant change. New small linear densities seen in left lower lung fields. Left hemidiaphragm  is elevated. There is blunting of both lateral CP angles. There is no pneumothorax. IMPRESSION: Patchy infiltrates in right lower lung fields may suggest atelectasis/pneumonia. New linear densities in the left lower lung fields suggest subsegmental atelectasis. Small bilateral pleural effusions with interval increase in the left pleural effusion. Electronically Signed   By: Elmer Picker M.D.   On: 08/26/2021 16:39   US THORACENTESIS ASP PLEURAL SPACE W/IMG GUIDE  Result Date: 08/27/2021 Lavonia Dana, MD     08/27/2021 12:21 PM PreOperative Dx: Malignant LEFT pleural effusion Postoperative Dx: Malignant LEFT pleural effusion Procedure:   US guided LEFT thoracentesis Radiologist:  Thornton Papas Anesthesia:  10 ml of 1% lidocaine Specimen:  1.1 L of dark old bloody fluid colored fluid EBL:   < 1 ml Complications: None     Microbiology: Results for orders placed or performed during the hospital encounter of 08/26/21  Resp Panel by RT-PCR (Flu A&B, Covid) Nasopharyngeal Swab     Status: None   Collection Time: 08/26/21  4:16 PM   Specimen: Nasopharyngeal Swab; Nasopharyngeal(NP) swabs in vial transport medium  Result Value Ref Range Status   SARS Coronavirus 2 by RT PCR NEGATIVE NEGATIVE Final    Comment: (NOTE) SARS-CoV-2 target nucleic acids are NOT DETECTED.  The SARS-CoV-2 RNA is generally detectable in upper respiratory specimens during the acute phase of infection. The lowest concentration of SARS-CoV-2 viral copies this assay can detect is 138 copies/mL. A negative result does not preclude SARS-Cov-2 infection and should not be used as the sole basis for treatment or other patient management decisions. A negative result may occur with  improper specimen collection/handling, submission of specimen other than nasopharyngeal swab, presence of viral mutation(s) within the areas targeted by this assay, and inadequate number of viral copies(<138 copies/mL). A negative result must be combined  with clinical observations, patient history, and epidemiological information. The expected result is Negative.  Fact Sheet for Patients:  EntrepreneurPulse.com.au  Fact Sheet for Healthcare Providers:  IncredibleEmployment.be  This test is no t yet approved or cleared by the [place] FDA and  has been authorized for detection and/or diagnosis of SARS-CoV-2 by FDA under an Emergency Use Authorization (EUA). This EUA will remain  in effect (meaning this test can be used) for the duration of the COVID-19 declaration under Section 564(b)(1) of the Act, 21 U.S.C.section 360bbb-3(b)(1), unless the authorization is terminated  or revoked sooner.       Influenza A by PCR NEGATIVE NEGATIVE Final   Influenza B by PCR NEGATIVE NEGATIVE Final    Comment: (NOTE) The Xpert Xpress SARS-CoV-2/FLU/RSV plus assay is intended as an aid in the diagnosis of influenza from Nasopharyngeal swab specimens and should not be used as a sole basis for treatment. Nasal washings and aspirates are unacceptable for Xpert Xpress SARS-CoV-2/FLU/RSV testing.  Fact Sheet for Patients: EntrepreneurPulse.com.au  Fact Sheet for Healthcare Providers: IncredibleEmployment.be  This test is not yet approved or cleared by the [place] FDA and has been authorized for detection and/or diagnosis of SARS-CoV-2 by FDA under an Emergency Use Authorization (EUA). This EUA will remain  in effect (meaning this test can be used) for the duration of the COVID-19 declaration under Section 564(b)(1) of the Act, 21 U.S.C. section 360bbb-3(b)(1), unless the authorization is terminated or revoked.  Performed at [facility], [address], [place], [place] [address]   Culture, blood (routine x 2)     Status: None (Preliminary result)   Collection Time: 08/26/21  5:29 PM   Specimen: BLOOD  Result Value Ref Range Status   Specimen Description BLOOD  BLOOD RIGHT HAND  Final   Special Requests   Final    Blood Culture adequate volume BOTTLES DRAWN AEROBIC AND ANAEROBIC   Culture   Final    NO GROWTH < 24 HOURS Performed at [facility], [address] [place], [place] [address]    Report Status PENDING  Incomplete  Culture, blood (routine x 2)     Status: None (Preliminary result)   Collection Time: 08/26/21  5:29 PM   Specimen: BLOOD  Result Value Ref Range Status   Specimen Description BLOOD BLOOD LEFT HAND  Final   Special Requests   Final    BOTTLES DRAWN AEROBIC AND ANAEROBIC Blood Culture adequate volume   Culture   Final    NO GROWTH < 24 HOURS Performed at [facility], [address] [place], [place] [address]    Report Status PENDING  Incomplete    Labs: CBC: Recent Labs  Lab 08/26/21 1610 08/27/21 0441  WBC 7.1 6.3  HGB 10.9* 9.3*  HCT 35.4* 30.4*  MCV 76.6* 78.1*  PLT 224 438   Basic Metabolic Panel: Recent Labs  Lab 08/26/21 1610 08/27/21 0441  NA 137 139  K 3.3* 3.5  CL 98 103  CO2 23 24  GLUCOSE 209* 212*  BUN 19 17  CREATININE 1.08 0.97  CALCIUM 9.0 8.7*   Liver Function Tests: Recent Labs  Lab 08/26/21 1610  AST 20  ALT 17  ALKPHOS 96  BILITOT 0.7  PROT 7.9  ALBUMIN 3.7   CBG: Recent Labs  Lab 08/26/21 2119 08/27/21 0745  GLUCAP 204* 231*    Discharge time spent: less than 30 minutes.  Signed: Irwin Brakeman, MD [facility] 08/27/2021

## 2021-08-27 NOTE — Procedures (Signed)
PreOperative Dx: Malignant LEFT pleural effusion Postoperative Dx: Malignant LEFT pleural effusion Procedure:   US guided LEFT thoracentesis Radiologist:  Thornton Papas Anesthesia:  10 ml of 1% lidocaine Specimen:  1.1 L of dark old bloody fluid colored fluid EBL:   < 1 ml Complications: None

## 2021-08-27 NOTE — TOC Transition Note (Signed)
Transition of Care [facility]) - CM/SW Discharge Note   Patient Details  Name: Derek Owen MRN: [id number] Date of Birth: April 12, 1966  Transition of Care [facility] CM/SW Contact:  Boneta Lucks, RN Phone Number: 08/27/2021, 1:54 PM   Clinical Narrative:  Patient admitted with recurrent pleural effusion. TOC consulted to no hot water and no electric. TOC could not reach patient, friend on the chart is aware. The landlord is letting him stay in the trailer and aware of the issues. Multiple people has given patient money. Patient has application for other places to live and has not filled them out. Patient advised by family to go to DSS. Patient has multiple follow up appointments. TOC signing off this is a home social issue patient needs to work on and has assist from friends and family when he is willing to take some action himself.      Barriers to Discharge: Barriers Resolved   Patient Goals and CMS Choice Patient states their goals for this hospitalization and ongoing recovery are:: to go home. CMS Medicare.gov Compare Post Acute Care list provided to:: Patient

## 2021-08-31 ENCOUNTER — Encounter: Payer: Self-pay | Admitting: Nurse Practitioner

## 2021-08-31 ENCOUNTER — Telehealth: Payer: Self-pay

## 2021-08-31 ENCOUNTER — Other Ambulatory Visit: Payer: Self-pay

## 2021-08-31 ENCOUNTER — Ambulatory Visit (INDEPENDENT_AMBULATORY_CARE_PROVIDER_SITE_OTHER): Payer: Managed Care, Other (non HMO) | Admitting: Nurse Practitioner

## 2021-08-31 VITALS — BP 131/71 | HR 113 | Ht 72.0 in | Wt 303.0 lb

## 2021-08-31 DIAGNOSIS — E1159 Type 2 diabetes mellitus with other circulatory complications: Secondary | ICD-10-CM | POA: Diagnosis not present

## 2021-08-31 DIAGNOSIS — C7800 Secondary malignant neoplasm of unspecified lung: Secondary | ICD-10-CM

## 2021-08-31 DIAGNOSIS — Z09 Encounter for follow-up examination after completed treatment for conditions other than malignant neoplasm: Secondary | ICD-10-CM

## 2021-08-31 DIAGNOSIS — I152 Hypertension secondary to endocrine disorders: Secondary | ICD-10-CM

## 2021-08-31 LAB — CULTURE, BLOOD (ROUTINE X 2)
Culture: NO GROWTH
Culture: NO GROWTH
Special Requests: ADEQUATE
Special Requests: ADEQUATE

## 2021-08-31 NOTE — Progress Notes (Signed)
° °  Derek Owen     MRN: [id number]      DOB: Feb 11, 1966   HPI 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 Pt stated that he was told that he has 6 months to live. Had VATS done with biopsy on 07/25/21. Pt is followed by Dr. Delton Coombes, oncology. Plan is to do oral chemotherapy cabometyx ans well as immunotherapy. Pt stated that he is still having trouble breathing , he stated that he has not been seen by pulmonology yet since his recent hospital visit, when he wakes up he feels like he is suffocating, when he lays or walking he feels really SOB. He has been using his albuterol inhaler but its not helping. Smokes 6 cigarettes a day.  Pt stated that he was on oxygen while at the hospital. He  has a follow up with Dr Cyndia Bent in March, Pt stated that his cancer med will arrive at his home tomorrow.       ROS Denies recent fever or chills. Denies sinus pressure, nasal congestion, ear pain or sore throat. Pt c/o sob, wheezing, dyspnea, denies cough Denies chest pains, palpitations and leg swelling Denies abdominal pain, nausea, vomiting,diarrhea or constipation.   Denies dysuria, frequency, hesitancy or incontinence. Denies joint pain, swelling and limitation in mobility. Denies headaches, seizures, numbness, or tingling. Denies depression, anxiety or insomnia. Denies skin break down or rash.   PE  BP 131/71    Pulse (!) 113    Ht 6' (1.829 m)    Wt (!) 303 lb (137.4 kg)    SpO2 (!) 89%    BMI 41.09 kg/m   Patient alert and oriented and pt was tachypneic and appears to be in pulmonary distress during ambulation without oxygen , after placing pt on oxygen and sitting down  his respiratory distress was resolved.   Chest: clear diminished bilaterally. SOB on excertion  oxygen sat on room air before ambulation was 89%, on RA, while ambulating on RA  oxygen sat dropped to 52% , oxygen level on ambulation with 3lts was 92%   CVS: S1, S2 no murmurs, no S3.Regular rate.  ABD: Soft non tender.   Ext: No edema  MS: Adequate ROM spine, shoulders, hips and knees.  Skin: Intact, no ulcerations or rash noted.  Psych: Good eye contact, normal affect. Memory intact not anxious or depressed appearing.  CNS: CN 2-12 intact, power,  normal throughout.no focal deficits noted.   Assessment & Plan

## 2021-08-31 NOTE — Telephone Encounter (Signed)
Per [facility] portal, patients Cabometyx was shipped today, 08/31/21.  [place] [facility] [place] Phone [phone] Fax [phone] 08/31/2021 4:04 PM

## 2021-08-31 NOTE — Assessment & Plan Note (Signed)
Patient alert and oriented and pt was tachypneic and appears to be in pulmonary distress during ambulation without oxygen , after placing pt on oxygen and sitting down  his respiratory distress was resolved.   Chest: clear diminished bilaterally. SOB on excertion  oxygen sat on room air before ambulation was 89%, on RA, while ambulating on RA oxygen sat dropped to 52% , oxygen level on ambulation with 3lts was 92%   order placed for home oxygen at 3lpm continuous  Pt states that his cabometyx 40mg  will arrive in his mail and he will start taking med as soon as he receives it.  Pt encouraged to follow up closely with DR Delton Coombes and pulmonology, he verbalized understaning

## 2021-08-31 NOTE — Patient Instructions (Signed)
We have ordered oxygen for you, your home oxygen should be delivered to you by the company today. Please use 3 liters when ambulating. Please follow up with pulmonology and oncology as discussed. If you get short of breath despite using oxygen please go to the ER.  Thanks for choosing [facility], we consider it a privelige to serve you.

## 2021-08-31 NOTE — Assessment & Plan Note (Signed)
BP Readings from Last 3 Encounters:  08/31/21 131/71  08/27/21 100/64  08/24/21 110/80  currently controlled on amlodipine 10mg  daily Losartan-HTCZ 100-25mg  tablet daily Continue current meds. No labs today

## 2021-08-31 NOTE — Assessment & Plan Note (Signed)
Hospital chart review including DC summary He was admitted for recurrent pleural effusion due to cancer of kidney with metastasis to lungs Pt noted to be SOB with excertion. continuous home o2 ordered today. Oxygen supply will be delivered to pt;s home

## 2021-09-03 ENCOUNTER — Telehealth: Payer: Self-pay | Admitting: Nurse Practitioner

## 2021-09-03 NOTE — Telephone Encounter (Signed)
Patient called in requesting a portable oxygen tank. Patient would like home delivery as well.

## 2021-09-06 ENCOUNTER — Telehealth: Payer: Self-pay

## 2021-09-06 NOTE — Telephone Encounter (Signed)
Patient called need portable oxygen he is at home with only a 25 foot hose he said.  Can he get a portable so he can go outside.

## 2021-09-06 NOTE — Telephone Encounter (Signed)
In the process of ordering

## 2021-09-07 NOTE — Telephone Encounter (Signed)
Just call Ashly at [facility] and let him know what he is requesting, maybe he can do it without the order. I usually just do Unable to find and write in portable oxygen tank at * liters per min with a diagnosis

## 2021-09-08 ENCOUNTER — Telehealth: Payer: Self-pay | Admitting: Nurse Practitioner

## 2021-09-08 NOTE — Telephone Encounter (Signed)
Patient called back in regard to portable oxygen tank.  ? ?Patient just checking the status. ? ?Would like a call back in regard.  ?

## 2021-09-08 NOTE — Telephone Encounter (Signed)
Spoke with Derek Owen with [employer] he is checking into this he states patient should have gotten both a tank and a portable he is going to call me back once he finds out  ?

## 2021-09-08 NOTE — Telephone Encounter (Signed)
Patient aware I spoke with Derek Owen with [employer] and will be getting back with him today  ?

## 2021-09-09 ENCOUNTER — Telehealth (HOSPITAL_COMMUNITY): Payer: Self-pay | Admitting: Hematology

## 2021-09-09 NOTE — Telephone Encounter (Signed)
Pt's friend called to alert of his financial and medical issues. Per AD she will get our nurse navigator involved. ?

## 2021-09-10 ENCOUNTER — Encounter (HOSPITAL_COMMUNITY): Payer: Self-pay

## 2021-09-10 DIAGNOSIS — C642 Malignant neoplasm of left kidney, except renal pelvis: Secondary | ICD-10-CM

## 2021-09-10 NOTE — Progress Notes (Signed)
Call received from patient friend expressing concerns regarding patient's oxygen need and living situation. Spoke with patient directly who reports no concerns with living situation but does report that there was a delay in getting patient's oxygen at home but states that he did get it yesterday. Discussed medication regimen, patient reports that he is taking medication, including Cabometyx as prescribed. Patient transferred to scheduling for assistance. ?

## 2021-09-13 ENCOUNTER — Encounter: Payer: Self-pay | Admitting: Licensed Clinical Social Worker

## 2021-09-13 NOTE — Progress Notes (Signed)
[facility] Clinical Social Work  ?Initial Assessment ? ? ?Derek Owen is a 56 y.o. year old male contacted by phone. Clinical Social Work was referred by nurse navigator for assessment of psychosocial needs.  ? ?SDOH (Social Determinants of Health) assessments performed: Yes ?  ?Distress Screen completed: No ?No flowsheet data found. ? ? ? ?Family/Social Information:  ?Housing Arrangement: patient lives alone ?Family members/support persons in your life? Pt states his immediate family is deceased and he has very limited support.  Pt has a cousin Braulio Conte) and step sister who check in occasionally ?Transportation concerns: At present pt states he is still driving himself; however, it is anticipated transportation will become a concern as the disease progresses  ?Employment: Disabled. Income source: Pt was working as a Administrator prior to diagnosis.  Pt has been unable to work since December when he admitted to the hospital.  Per pt he was approved for disability last week and has received his first disability check. ?Financial concerns: Yes, due to illness and/or loss of work during treatment ?Type of concern:  Pt states he is not behind on bills as of yet, but is concerned he may fall behind with the loss in income and the medical bills he is now receiving. ?Food access concerns: no ?Religious or spiritual practice: none mentioned ?Services Currently in place:  none at this time ? ?Coping/ Adjustment to diagnosis: ?Patient understands treatment plan and what happens next? yes ?Concerns about diagnosis and/or treatment:  Pt is concerned about his living arrangement as he resides alone and describes his landlord as a "slumlord".  Pt would like to find an assisted living facility to move to. ?Patient reported stressors: Housing ?Hopes and priorities: Pt's most significant priority at present is to find housing and adequate support as his needs increase. ?Patient enjoys    ?Current coping skills/ strengths: Capable of  independent living  ? ? ? SUMMARY: ?Current SDOH Barriers:  ?Limited social support ? ?Clinical Social Work Clinical Goal(s):  ?explore community resource options for unmet needs related to:  Housing  ? ?Interventions: ?Discussed common feeling and emotions when being diagnosed with cancer, and the importance of support during treatment ?Informed patient of the support team roles and support services at [facility] ?Provided CSW contact information and encouraged patient to call with any questions or concerns ?Writer discussed palliative home services at length with patient as it would provide additional supportive care as his disease progresses and with the assistance of a home care agency, home services may be beneficial to assist with a transition to a more supportive living environment.   Pt verbalized agreement to a palliative care referral.  RN Navigator informed of the above. ? ? ?Follow Up Plan: CSW will follow-up with patient by phone  ?Patient verbalizes understanding of plan: Yes ? ? ? ?Henriette Combs, LCSW ?

## 2021-09-14 ENCOUNTER — Encounter: Payer: Managed Care, Other (non HMO) | Admitting: Nurse Practitioner

## 2021-09-21 ENCOUNTER — Inpatient Hospital Stay (HOSPITAL_COMMUNITY): Payer: Medicaid Other | Admitting: Hematology

## 2021-09-21 ENCOUNTER — Other Ambulatory Visit (HOSPITAL_COMMUNITY): Payer: Managed Care, Other (non HMO)

## 2021-09-21 ENCOUNTER — Other Ambulatory Visit: Payer: Self-pay

## 2021-09-21 ENCOUNTER — Encounter (HOSPITAL_COMMUNITY): Payer: Self-pay

## 2021-09-21 ENCOUNTER — Inpatient Hospital Stay (HOSPITAL_COMMUNITY): Payer: Medicaid Other

## 2021-09-21 ENCOUNTER — Emergency Department (HOSPITAL_COMMUNITY): Payer: Medicaid Other

## 2021-09-21 ENCOUNTER — Inpatient Hospital Stay (HOSPITAL_COMMUNITY)
Admission: EM | Admit: 2021-09-21 | Discharge: 2021-09-30 | DRG: 189 | Disposition: A | Discharge status: Skilled Nursing Facility | Payer: Medicaid Other | Attending: Internal Medicine | Admitting: Internal Medicine

## 2021-09-21 DIAGNOSIS — E876 Hypokalemia: Secondary | ICD-10-CM | POA: Diagnosis not present

## 2021-09-21 DIAGNOSIS — E1165 Type 2 diabetes mellitus with hyperglycemia: Secondary | ICD-10-CM | POA: Diagnosis present

## 2021-09-21 DIAGNOSIS — J910 Malignant pleural effusion: Secondary | ICD-10-CM | POA: Diagnosis present

## 2021-09-21 DIAGNOSIS — C782 Secondary malignant neoplasm of pleura: Secondary | ICD-10-CM | POA: Diagnosis present

## 2021-09-21 DIAGNOSIS — Z9889 Other specified postprocedural states: Secondary | ICD-10-CM

## 2021-09-21 DIAGNOSIS — E782 Mixed hyperlipidemia: Secondary | ICD-10-CM | POA: Diagnosis present

## 2021-09-21 DIAGNOSIS — R0609 Other forms of dyspnea: Secondary | ICD-10-CM | POA: Diagnosis present

## 2021-09-21 DIAGNOSIS — Z7984 Long term (current) use of oral hypoglycemic drugs: Secondary | ICD-10-CM

## 2021-09-21 DIAGNOSIS — J9811 Atelectasis: Secondary | ICD-10-CM | POA: Diagnosis present

## 2021-09-21 DIAGNOSIS — Z7982 Long term (current) use of aspirin: Secondary | ICD-10-CM

## 2021-09-21 DIAGNOSIS — Z720 Tobacco use: Secondary | ICD-10-CM | POA: Diagnosis not present

## 2021-09-21 DIAGNOSIS — Z515 Encounter for palliative care: Secondary | ICD-10-CM | POA: Diagnosis not present

## 2021-09-21 DIAGNOSIS — Z79899 Other long term (current) drug therapy: Secondary | ICD-10-CM

## 2021-09-21 DIAGNOSIS — K5900 Constipation, unspecified: Secondary | ICD-10-CM | POA: Diagnosis present

## 2021-09-21 DIAGNOSIS — R600 Localized edema: Secondary | ICD-10-CM

## 2021-09-21 DIAGNOSIS — Z6841 Body Mass Index (BMI) 40.0 and over, adult: Secondary | ICD-10-CM

## 2021-09-21 DIAGNOSIS — J90 Pleural effusion, not elsewhere classified: Secondary | ICD-10-CM | POA: Diagnosis not present

## 2021-09-21 DIAGNOSIS — J9621 Acute and chronic respiratory failure with hypoxia: Principal | ICD-10-CM

## 2021-09-21 DIAGNOSIS — C649 Malignant neoplasm of unspecified kidney, except renal pelvis: Secondary | ICD-10-CM | POA: Diagnosis present

## 2021-09-21 DIAGNOSIS — I10 Essential (primary) hypertension: Secondary | ICD-10-CM

## 2021-09-21 DIAGNOSIS — J441 Chronic obstructive pulmonary disease with (acute) exacerbation: Secondary | ICD-10-CM

## 2021-09-21 DIAGNOSIS — C7800 Secondary malignant neoplasm of unspecified lung: Secondary | ICD-10-CM

## 2021-09-21 DIAGNOSIS — F17210 Nicotine dependence, cigarettes, uncomplicated: Secondary | ICD-10-CM | POA: Diagnosis present

## 2021-09-21 DIAGNOSIS — Z7189 Other specified counseling: Secondary | ICD-10-CM | POA: Diagnosis not present

## 2021-09-21 DIAGNOSIS — E8770 Fluid overload, unspecified: Secondary | ICD-10-CM | POA: Diagnosis present

## 2021-09-21 DIAGNOSIS — R0602 Shortness of breath: Secondary | ICD-10-CM

## 2021-09-21 DIAGNOSIS — Z66 Do not resuscitate: Secondary | ICD-10-CM | POA: Diagnosis not present

## 2021-09-21 DIAGNOSIS — Z20822 Contact with and (suspected) exposure to covid-19: Secondary | ICD-10-CM | POA: Diagnosis present

## 2021-09-21 DIAGNOSIS — Z716 Tobacco abuse counseling: Secondary | ICD-10-CM

## 2021-09-21 DIAGNOSIS — M0580 Other rheumatoid arthritis with rheumatoid factor of unspecified site: Secondary | ICD-10-CM | POA: Diagnosis not present

## 2021-09-21 DIAGNOSIS — M069 Rheumatoid arthritis, unspecified: Secondary | ICD-10-CM | POA: Diagnosis present

## 2021-09-21 DIAGNOSIS — Z9981 Dependence on supplemental oxygen: Secondary | ICD-10-CM | POA: Diagnosis not present

## 2021-09-21 DIAGNOSIS — T502X5A Adverse effect of carbonic-anhydrase inhibitors, benzothiadiazides and other diuretics, initial encounter: Secondary | ICD-10-CM | POA: Diagnosis not present

## 2021-09-21 DIAGNOSIS — Z8673 Personal history of transient ischemic attack (TIA), and cerebral infarction without residual deficits: Secondary | ICD-10-CM

## 2021-09-21 HISTORY — DX: Malignant neoplasm of unspecified kidney, except renal pelvis: C64.9

## 2021-09-21 HISTORY — DX: Malignant neoplasm of unspecified part of unspecified bronchus or lung: C34.90

## 2021-09-21 LAB — RESP PANEL BY RT-PCR (FLU A&B, COVID) ARPGX2
Influenza A by PCR: NEGATIVE
Influenza B by PCR: NEGATIVE
SARS Coronavirus 2 by RT PCR: NEGATIVE

## 2021-09-21 LAB — CBC WITH DIFFERENTIAL/PLATELET
Abs Immature Granulocytes: 0.02 10*3/uL (ref 0.00–0.07)
Basophils Absolute: 0 10*3/uL (ref 0.0–0.1)
Basophils Relative: 1 %
Eosinophils Absolute: 0.1 10*3/uL (ref 0.0–0.5)
Eosinophils Relative: 1 %
HCT: 36.6 % — ABNORMAL LOW (ref 39.0–52.0)
Hemoglobin: 10.7 g/dL — ABNORMAL LOW (ref 13.0–17.0)
Immature Granulocytes: 0 %
Lymphocytes Relative: 9 %
Lymphs Abs: 0.6 10*3/uL — ABNORMAL LOW (ref 0.7–4.0)
MCH: 22.2 pg — ABNORMAL LOW (ref 26.0–34.0)
MCHC: 29.2 g/dL — ABNORMAL LOW (ref 30.0–36.0)
MCV: 75.8 fL — ABNORMAL LOW (ref 80.0–100.0)
Monocytes Absolute: 0.5 10*3/uL (ref 0.1–1.0)
Monocytes Relative: 7 %
Neutro Abs: 5.3 10*3/uL (ref 1.7–7.7)
Neutrophils Relative %: 82 %
Platelets: 220 10*3/uL (ref 150–400)
RBC: 4.83 MIL/uL (ref 4.22–5.81)
RDW: 17.8 % — ABNORMAL HIGH (ref 11.5–15.5)
WBC: 6.5 10*3/uL (ref 4.0–10.5)
nRBC: 0 % (ref 0.0–0.2)

## 2021-09-21 LAB — COMPREHENSIVE METABOLIC PANEL
ALT: 21 U/L (ref 0–44)
AST: 25 U/L (ref 15–41)
Albumin: 3.1 g/dL — ABNORMAL LOW (ref 3.5–5.0)
Alkaline Phosphatase: 111 U/L (ref 38–126)
Anion gap: 12 (ref 5–15)
BUN: 10 mg/dL (ref 6–20)
CO2: 35 mmol/L — ABNORMAL HIGH (ref 22–32)
Calcium: 8.5 mg/dL — ABNORMAL LOW (ref 8.9–10.3)
Chloride: 92 mmol/L — ABNORMAL LOW (ref 98–111)
Creatinine, Ser: 0.95 mg/dL (ref 0.61–1.24)
GFR, Estimated: >60 mL/min (ref >60)
Glucose, Bld: 211 mg/dL — ABNORMAL HIGH (ref 70–99)
Potassium: 3.1 mmol/L — ABNORMAL LOW (ref 3.5–5.1)
Sodium: 139 mmol/L (ref 135–145)
Total Bilirubin: 0.3 mg/dL (ref 0.3–1.2)
Total Protein: 7.8 g/dL (ref 6.5–8.1)

## 2021-09-21 LAB — PROCALCITONIN: Procalcitonin: <0.1 ng/mL

## 2021-09-21 LAB — GRAM STAIN: Gram Stain: NONE SEEN

## 2021-09-21 LAB — BRAIN NATRIURETIC PEPTIDE: B Natriuretic Peptide: 34 pg/mL (ref 0.0–100.0)

## 2021-09-21 LAB — LACTATE DEHYDROGENASE, PLEURAL OR PERITONEAL FLUID: LD, Fluid: 269 U/L — ABNORMAL HIGH (ref 3–23)

## 2021-09-21 LAB — BODY FLUID CELL COUNT WITH DIFFERENTIAL
Lymphs, Fluid: 9 %
Monocyte-Macrophage-Serous Fluid: 86 % (ref 50–90)
Neutrophil Count, Fluid: 3 % (ref 0–25)
Other Cells, Fluid: 2 %
Total Nucleated Cell Count, Fluid: 928 cu mm (ref 0–1000)

## 2021-09-21 LAB — GLUCOSE, CAPILLARY
Glucose-Capillary: 216 mg/dL — ABNORMAL HIGH (ref 70–99)
Glucose-Capillary: 210 mg/dL — ABNORMAL HIGH (ref 70–99)

## 2021-09-21 MED ORDER — INSULIN ASPART 100 UNIT/ML IJ SOLN
0.0000 [IU] | Freq: Three times a day (TID) | INTRAMUSCULAR | Status: DC
  Administered 2021-09-21: 5 [IU] via SUBCUTANEOUS
  Administered 2021-09-22: 3 [IU] via SUBCUTANEOUS
  Administered 2021-09-22 – 2021-09-23 (×3): 8 [IU] via SUBCUTANEOUS
  Administered 2021-09-23: 11 [IU] via SUBCUTANEOUS
  Administered 2021-09-24: 8 [IU] via SUBCUTANEOUS
  Administered 2021-09-24: 3 [IU] via SUBCUTANEOUS
  Administered 2021-09-24: 11 [IU] via SUBCUTANEOUS
  Administered 2021-09-25 (×2): 5 [IU] via SUBCUTANEOUS
  Administered 2021-09-26: 11 [IU] via SUBCUTANEOUS
  Administered 2021-09-26: 3 [IU] via SUBCUTANEOUS
  Administered 2021-09-26: 8 [IU] via SUBCUTANEOUS
  Administered 2021-09-27 (×2): 2 [IU] via SUBCUTANEOUS
  Administered 2021-09-27: 8 [IU] via SUBCUTANEOUS
  Administered 2021-09-28: 2 [IU] via SUBCUTANEOUS
  Administered 2021-09-28: 5 [IU] via SUBCUTANEOUS
  Administered 2021-09-28: 2 [IU] via SUBCUTANEOUS
  Administered 2021-09-29 (×2): 3 [IU] via SUBCUTANEOUS
  Administered 2021-09-29: 2 [IU] via SUBCUTANEOUS

## 2021-09-21 MED ORDER — BUDESONIDE 0.5 MG/2ML IN SUSP
0.5000 mg | Freq: Two times a day (BID) | RESPIRATORY_TRACT | Status: DC
  Administered 2021-09-21 – 2021-09-29 (×17): 0.5 mg via RESPIRATORY_TRACT
  Filled 2021-09-21 (×18): qty 2

## 2021-09-21 MED ORDER — SEMAGLUTIDE 14 MG PO TABS
14.0000 mg | ORAL_TABLET | Freq: Every day | ORAL | Status: DC
  Filled 2021-09-21 (×3): qty 1

## 2021-09-21 MED ORDER — ONDANSETRON HCL 4 MG PO TABS: 4.0000 mg | ORAL_TABLET | Freq: Four times a day (QID) | ORAL | Status: DC | PRN

## 2021-09-21 MED ORDER — LIDOCAINE HCL (PF) 2 % IJ SOLN
INTRAMUSCULAR | Status: AC
  Filled 2021-09-21: qty 10

## 2021-09-21 MED ORDER — AMLODIPINE BESYLATE 5 MG PO TABS
10.0000 mg | ORAL_TABLET | Freq: Every day | ORAL | Status: DC
  Administered 2021-09-22 – 2021-09-29 (×8): 10 mg via ORAL
  Filled 2021-09-21 (×8): qty 2

## 2021-09-21 MED ORDER — ONDANSETRON HCL 4 MG/2ML IJ SOLN: 4.0000 mg | Freq: Four times a day (QID) | INTRAMUSCULAR | Status: DC | PRN

## 2021-09-21 MED ORDER — ENOXAPARIN SODIUM 80 MG/0.8ML IJ SOSY
70.0000 mg | PREFILLED_SYRINGE | INTRAMUSCULAR | Status: DC
  Administered 2021-09-21 – 2021-09-29 (×9): 70 mg via SUBCUTANEOUS
  Filled 2021-09-21 (×9): qty 0.8

## 2021-09-21 MED ORDER — INSULIN ASPART 100 UNIT/ML IJ SOLN
3.0000 [IU] | Freq: Three times a day (TID) | INTRAMUSCULAR | Status: DC
  Administered 2021-09-21 – 2021-09-23 (×6): 3 [IU] via SUBCUTANEOUS

## 2021-09-21 MED ORDER — IPRATROPIUM-ALBUTEROL 0.5-2.5 (3) MG/3ML IN SOLN
3.0000 mL | Freq: Four times a day (QID) | RESPIRATORY_TRACT | Status: DC
Start: 2021-09-21 — End: 2021-09-27
  Administered 2021-09-21 – 2021-09-26 (×19): 3 mL via RESPIRATORY_TRACT
  Filled 2021-09-21 (×10): qty 3
  Filled 2021-09-21: qty 6
  Filled 2021-09-21 (×9): qty 3

## 2021-09-21 MED ORDER — ASPIRIN 81 MG PO CHEW
81.0000 mg | CHEWABLE_TABLET | Freq: Every day | ORAL | Status: DC
  Administered 2021-09-22 – 2021-09-29 (×8): 81 mg via ORAL
  Filled 2021-09-21 (×8): qty 1

## 2021-09-21 MED ORDER — LOSARTAN POTASSIUM 50 MG PO TABS
100.0000 mg | ORAL_TABLET | Freq: Every day | ORAL | Status: DC
  Administered 2021-09-22 – 2021-09-29 (×8): 100 mg via ORAL
  Filled 2021-09-21 (×8): qty 2

## 2021-09-21 MED ORDER — INSULIN ASPART 100 UNIT/ML IJ SOLN
0.0000 [IU] | Freq: Every day | INTRAMUSCULAR | Status: DC
  Administered 2021-09-21: 2 [IU] via SUBCUTANEOUS
  Administered 2021-09-26: 4 [IU] via SUBCUTANEOUS
  Administered 2021-09-27: 5 [IU] via SUBCUTANEOUS
  Administered 2021-09-28: 3 [IU] via SUBCUTANEOUS
  Administered 2021-09-29: 2 [IU] via SUBCUTANEOUS

## 2021-09-21 MED ORDER — ATORVASTATIN CALCIUM 40 MG PO TABS
40.0000 mg | ORAL_TABLET | Freq: Every day | ORAL | Status: DC
  Administered 2021-09-22 – 2021-09-29 (×8): 40 mg via ORAL
  Filled 2021-09-21 (×8): qty 1

## 2021-09-21 MED ORDER — METHYLPREDNISOLONE SODIUM SUCC 125 MG IJ SOLR
120.0000 mg | INTRAMUSCULAR | Status: DC
  Administered 2021-09-21 – 2021-09-23 (×3): 120 mg via INTRAVENOUS
  Filled 2021-09-21 (×4): qty 2

## 2021-09-21 MED ORDER — AZITHROMYCIN 250 MG PO TABS
500.0000 mg | ORAL_TABLET | Freq: Once | ORAL | Status: AC
  Administered 2021-09-21: 500 mg via ORAL
  Filled 2021-09-21: qty 2

## 2021-09-21 MED ORDER — SODIUM CHLORIDE 0.9 % IV SOLN
1.0000 g | Freq: Once | INTRAVENOUS | Status: AC
  Administered 2021-09-21: 1 g via INTRAVENOUS

## 2021-09-21 MED ORDER — POTASSIUM CHLORIDE CRYS ER 20 MEQ PO TBCR
40.0000 meq | EXTENDED_RELEASE_TABLET | Freq: Once | ORAL | Status: AC
  Administered 2021-09-21: 40 meq via ORAL
  Filled 2021-09-21: qty 2

## 2021-09-21 MED ORDER — CABOZANTINIB S-MALATE 40 MG PO TABS
40.0000 mg | ORAL_TABLET | Freq: Every day | ORAL | Status: DC
  Administered 2021-09-23 – 2021-09-29 (×7): 40 mg via ORAL
  Filled 2021-09-21 (×8): qty 1

## 2021-09-21 MED ORDER — ACETAMINOPHEN 325 MG PO TABS: 650.0000 mg | ORAL_TABLET | Freq: Four times a day (QID) | ORAL | Status: DC | PRN

## 2021-09-21 MED ORDER — ACETAMINOPHEN 650 MG RE SUPP: 650.0000 mg | Freq: Four times a day (QID) | RECTAL | Status: DC | PRN

## 2021-09-21 MED ORDER — FOLIC ACID 1 MG PO TABS
1.0000 mg | ORAL_TABLET | Freq: Every day | ORAL | Status: DC
  Administered 2021-09-21 – 2021-09-29 (×8): 1 mg via ORAL
  Filled 2021-09-21 (×9): qty 1

## 2021-09-21 NOTE — Assessment & Plan Note (Addendum)
-  Patient follows Dr. 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 ?-Patient states oncologist told him he had only a few months to live and seem to be at peace with this. ?-Patient states " he will be going home soon" ?-Dr. Delton Coombes added to the patient care team and placed in a formal consultation. ?-Consulted palliative care for goals of care and will continue to treat the Treatable ?-C/w Cabozatinib 40 mg po Daily  ?-Will likely need outpatient follow-up with palliative care services. ?-Patient's CODE STATUS has been changed to a DNR. ?

## 2021-09-21 NOTE — Assessment & Plan Note (Addendum)
-  Continue scheduled DuoNebs, Pulmicort, Flonase, Claritin, PPI.  ?-Transitioned from IV Solu-Medrol to oral prednisone taper and will continue Prednisone 40 mg daily x3 days ?-Repeat CXR in 3-6 weeks and follow up with Pulmonary within 1-2 weeks  ?

## 2021-09-21 NOTE — ED Triage Notes (Signed)
Patient reports worsening SHOB for the past three days requiring more than normal oxygen which he wears cont. Recent dx of lung and kidney cancer. Denies cough, fever.  ?

## 2021-09-21 NOTE — Assessment & Plan Note (Addendum)
-  Patient is status post VATS and R-pleural bx 07/25/21--Dr. Cyndia Bent ?-Requested for left-sided thoracocentesis 3/14--send fluid for cell count, culture, LDH, protein ?-May need to consider VATS/Pleurx catheter if continued reaccumulation ?-Last thoracocentesis 08/27/2021 removing 1.1 L ?-Hematologist/oncologist notified of admission via epic and will follow up with outpatient  ?-C/w Furosemide Daily ?-Repeat CXR in 3-6 weeks  ?

## 2021-09-21 NOTE — Assessment & Plan Note (Addendum)
-  Patient takes methotrexate once weekly ?-Follow-up Dr. Benjamine Mola in the outpatient setting  ?

## 2021-09-21 NOTE — Progress Notes (Signed)
PT tolerated left sided thoracentesis procedure well today and 1600 mL of dark blood fluid removed with labs collected and sent for processing. PT taken to xray post procedure to have chest xray which was read by radiologist in xray room. PT transported back to ED bed assignment at this time via stretcher with no acute distress noted and assigned RN given update at this time. ?

## 2021-09-21 NOTE — Assessment & Plan Note (Addendum)
-  Controlled on current regimen of Norvasc, losartan, Lasix. ?-Continue to monitor BP per Protocol ?-Last BP reading was 119/83 ?

## 2021-09-21 NOTE — Assessment & Plan Note (Addendum)
-  C/w Atorvastatin 40 mg po Daily   ?

## 2021-09-21 NOTE — H&P (Signed)
History and Physical    Patient: Derek Owen ZOX:[id number] DOB: Dec 02, 1965 DOA: 09/21/2021 DOS: the patient was seen and examined on 09/21/2021 PCP: Donell Beers, FNP  Patient coming from: Home  Chief Complaint:  Chief Complaint  Patient presents with   Shortness of Breath   HPI: Derek Owen is a 56 year old male with a history of tobacco abuse, diabetes mellitus type 2, rheumatoid arthritis, metastatic kidney cancer to the pleura status post VATS and pleural biopsy 07/23/2021 presenting with 3-day history of worsening shortness of breath.  Notably, the patient was recently mated to the hospital from 08/26/2021 to 08/27/2021 with similar symptoms.  CTA of the chest was negative for PE at that time.  He underwent left-sided thoracocentesis removing 1.1 L with symptomatic improvement.  In addition, the patient had a hospitalization from 07/18/2021 to 07/30/2021 for recurrent right-sided pleural effusion.  He subsequently underwent VATS and right pleural biopsy on 07/25/2021 performed by Dr. Laneta Simmers.  Pathology was suggestive of renal cell carcinoma.  The patient has since followed up with Dr. Ellin Saba.  The patient states that he has recently started cabometyx immunotherapy about 8 days prior to this admission.  He states that he is normally on 3 L nasal cannula at home but has become more short of breath.  He has also noted orthopnea type symptoms and increasing lower extremity edema.  Unfortunately, the patient continues to smoke.  He has a nonproductive cough.  There is no hemoptysis.  He has not had any fevers, chills, headache, neck pain, chest pain, nausea, vomiting, diarrhea, domino pain, dysuria, hematuria. In the ED, the patient was afebrile and hemodynamically stable with oxygen saturation 96-100% on 3 L.  BMP showed a sodium 139, potassium 3.1, bicarbonate 35, serum creatinine 0.95.  AST 25, ALT 21, alk phosphatase 111, total bilirubin 0.3, albumin 3.1.  WBC 6.5, hemoglobin 10.7,  platelets 220,000.  BNP 34.  Chest x-ray showed bilateral pleural effusion, left greater than right.  The patient was admitted for further evaluation and treatment of his acute on chronic respiratory failure.   Review of Systems: As mentioned in the history of present illness. All other systems reviewed and are negative. Past Medical History:  Diagnosis Date   Alcohol use disorder, mild, abuse    Cancer of kidney (HCC)    Cigarette smoker    Diabetes mellitus without complication (HCC)    Hyperlipidemia    Hypertension    Lung cancer (HCC)    Polycythemia 04/13/2020   Stroke (HCC)    x3   Past Surgical History:  Procedure Laterality Date   PLEURAL BIOPSY Right 07/25/2021   Procedure: PLEURAL BIOPSY;  Surgeon: Alleen Borne, MD;  Location: [facility] OR;  Service: Thoracic;  Laterality: Right;   PLEURAL EFFUSION DRAINAGE Right 07/25/2021   Procedure: DRAINAGE OF PLEURAL EFFUSION;  Surgeon: Alleen Borne, MD;  Location: [facility] OR;  Service: Thoracic;  Laterality: Right;   VIDEO ASSISTED THORACOSCOPY (VATS)/EMPYEMA Right 07/25/2021   Procedure: VIDEO ASSISTED THORACOSCOPY (VATS)/EMPYEMA;  Surgeon: Alleen Borne, MD;  Location: [facility] OR;  Service: Thoracic;  Laterality: Right;   Social History:  reports that he has been smoking cigarettes. He has been smoking an average of 1 pack per day. He has never used smokeless tobacco. He reports current alcohol use. He reports that he does not currently use drugs.  No Known Allergies  Family History  Adopted: Yes    Prior to Admission medications   Medication Sig Start Date End Date  Taking? Authorizing Provider  albuterol (VENTOLIN HFA) 108 (90 Base) MCG/ACT inhaler Inhale 1-2 puffs into the lungs every 4 (four) hours as needed for wheezing or shortness of breath. 07/12/21  Yes Edwin Dada P, DO  amLODipine (NORVASC) 10 MG tablet Take 1 tablet (10 mg total) by mouth daily. 06/17/21  Yes Kerri Perches, MD  aspirin 81 MG chewable tablet Chew 81 mg by  mouth daily.   Yes [provider]  atorvastatin (LIPITOR) 40 MG tablet Take 1 tablet (40 mg total) by mouth daily. 06/17/21  Yes Kerri Perches, MD  cabozantinib (CABOMETYX) 40 MG tablet Take 1 tablet (40 mg total) by mouth daily. Take on an empty stomach, 1 hour before or 2 hours after meals. 08/23/21  Yes Doreatha Massed, MD  folic acid (FOLVITE) 1 MG tablet Take 1 tablet (1 mg total) by mouth daily. 05/03/21  Yes Rice, Jamesetta Orleans, MD  glipiZIDE (GLUCOTROL) 10 MG tablet Take 1 tablet (10 mg total) by mouth daily before breakfast. 06/17/21  Yes Kerri Perches, MD  losartan-hydrochlorothiazide (HYZAAR) 100-25 MG tablet Take 1 tablet by mouth daily. 06/17/21  Yes Kerri Perches, MD  methotrexate (RHEUMATREX) 2.5 MG tablet Take 10 tablets (25 mg total) by mouth once a week. Patient taking differently: Take 15 mg by mouth once a week. 6 tabs on fridays 07/22/21  Yes Rice, Jamesetta Orleans, MD  Semaglutide (RYBELSUS) 14 MG TABS Take 14 mg by mouth daily. 04/26/21  Yes Dani Gobble, NP    Physical Exam: Vitals:   09/21/21 1330 09/21/21 1430 09/21/21 1500 09/21/21 1530  BP: (!) 133/91 135/87 137/90 (!) 149/99  Pulse: 87 80 83 (!) 105  Resp: (!) 22 (!) 21 (!) 25 19  Temp:      TempSrc:      SpO2: 94% 96% 100% 100%  Weight:      Height:       GENERAL:  A&O x 3, NAD, well developed, cooperative, follows commands HEENT: [place]/AT, No thrush, No icterus, No oral ulcers Neck:  No neck mass, No meningismus, soft, supple CV: RRR, no S3, no S4, no rub, no JVD Lungs:  bilateral exp wheeze.diminished BS bilateral. Abd: soft/NT +BS, nondistended Ext: 2 + LE edema, no lymphangitis, no cyanosis, no rashes Neuro:  CN II-XII intact, strength 4/5 in RUE, RLE, strength 4/5 LUE, LLE; sensation intact bilateral; no dysmetria; babinski equivocal  Data Reviewed: Labs reviewed in history  Assessment and Plan: * Acute on chronic respiratory failure with hypoxia (HCC) Multifactorial  including recurrent malignant pleural effusions, COPD exacerbation, and deconditioning Requested thoracocentesis on 09/21/2021--obtain cell count and culture Limited echo to rule out pericardial effusion/tamponade Stable on 4 L nasal cannula Wean oxygen as tolerated back to baseline 3 L With BNP 34--low suspicion of CHF   Recurrent pleural effusion Patient is status post VATS and R-pleural bx 07/25/21--Dr. Laneta Simmers -Requested for left-sided thoracocentesis 3/14--send fluid for cell count, culture, LDH, protein -May need to consider VATS/Pleurx catheter if continued reaccumulation -Last thoracocentesis 08/27/2021 removing 1.1 L  COPD with acute exacerbation (HCC) Start DuoNebs Start Pulmicort Start IV Solu-Medrol  Metastasis to lung [facility]) Patient follows Dr. 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 Ellin Saba to the patient care team  Hypokalemia Replete Check mag  Leg edema Venous duplex legs  Mixed hyperlipidemia Continue statin  Tobacco abuse Tobacco cessation discussed  Morbid  obesity (HCC) BMI 41.09 Lifestyle modificat  Essential hypertension Continue losartan Continue amlodipine  Uncontrolled type 2 diabetes mellitus with hyperglycemia (HCC) 07/19/2021 hemoglobin A1c 8.0 NovoLog sliding scale Holding glipizide  Polyarthritis with positive rheumatoid factor (HCC) Patient takes methotrexate once weekly -Follow-up Dr. Dimple Casey      Advance Care Planning: FULL CODE  Consults: none  Family Communication: none  Severity of Illness: The appropriate patient status for this patient is INPATIENT. Inpatient status is judged to be reasonable and necessary in order to provide the required intensity of service to ensure the patient's safety. The patient's presenting symptoms, physical exam findings, and initial radiographic and laboratory data in the  context of their chronic comorbidities is felt to place them at high risk for further clinical deterioration. Furthermore, it is not anticipated that the patient will be medically stable for discharge from the hospital within 2 midnights of admission.   * I certify that at the point of admission it is my clinical judgment that the patient will require inpatient hospital care spanning beyond 2 midnights from the point of admission due to high intensity of service, high risk for further deterioration and high frequency of surveillance required.*  Author: Catarina Hartshorn, MD 09/21/2021 3:46 PM  For on call review [email or web address].

## 2021-09-21 NOTE — Assessment & Plan Note (Addendum)
-  Secondary to diuresis.   ?-Potassium at 3.5 yesterday and today is 3.8 ?-Magnesium at 1.9 ?-Continue oral daily potassium supplementation.   ?-Repeat CMP and Mag within 1 week   ?

## 2021-09-21 NOTE — Assessment & Plan Note (Addendum)
-  Lower extremity Dopplers done 08/27/2021 negative for DVT. ?-Albumin level at 3.1 on last check. ?-Improving with diuresis.   ?-Transitioned from IV Lasix to oral Lasix.  ?-Follow in the outpatient setting  ?

## 2021-09-21 NOTE — Assessment & Plan Note (Addendum)
-  Multifactorial secondary to recurrent malignant pleural effusions, COPD exacerbation, and deconditioning and volume overload. ?-Status post therapeutic ultrasound-guided thoracentesis 09/21/2021 per IR with 1.6 L of fluid removed.  ?-Gram stain of pleural fluid with no organisms seen.  Cultures pending with no growth to date at 5 Days.  AFB negative..  ?-Patient states clinical improvement postthoracentesis and had one done in February 2023. ?-Patient received dose of Lasix over the past few days with good urine output.  ?-Patient was Lasix 40 mg every 12 hours and transitioned to po Lasix ?-Patient is -7665 L during this hospitalization (?? Accuracy). ?-Weight went from 303 -> 281 ?-Patient with clinical improvement. ?-May need a Pleurx drain if continued recurrent malignant pleural effusion but this can be considered in the outpatient setting ?-Continue Pulmicort nebs, scheduled DuoNebs, steroid taper. ?-Transition from IV Lasix to oral Lasix 40 mg daily. ?-Primary oncologist notified of admission via epic. ?Limited echo to rule out pericardial effusion/tamponade ?Currently on 3L nasal cannula with sats of 98%. ?-Patient noted to be on 3 L nasal cannula at baseline. ?-SpO2: 99 % ?O2 Flow Rate (L/min): 3 L/min ?-With BNP 34--?? low suspicion of CHF however patient with good diuresis on IV Lasix. ?-Status post IV albumin x2. ?-Likely close to baseline at this time. ?-Strict I's and O's, daily weights.  ?-Follow up with PCP, Pulmonary and TCTS in the outpatient setting with repeat CXR in 3-6 weeks ?

## 2021-09-21 NOTE — Assessment & Plan Note (Addendum)
-  Tobacco cessation discussed and counseling given ? ?

## 2021-09-21 NOTE — ED Provider Notes (Signed)
?[facility] ?Provider Note ? ? ?CSN: [id number] ?Arrival date & time: 09/21/21  1102 ? ?  ? ?History ? ?Chief Complaint  ?Patient presents with  ? Shortness of Breath  ? ? ?Derek Owen is a 56 y.o. male. ? ? ?Shortness of Breath ?Associated symptoms: no abdominal pain, no chest pain, no cough, no fever and no vomiting   ? ?  ? ?Derek Owen is a 56 y.o. male with past medical history of metastatic renal cell carcinoma, followed by Dr. Delton Coombes, type 2 diabetes, recurrent pleural effusions and hypertension who presents to the Emergency Department complaining of gradually worsening shortness of breath x3 days.  States he is normally on continuous 2 L of oxygen by nasal cannula.  States his shortness of breath worsens with exertion.  He has increased his oxygen use at home without improvement.  States his current symptoms feel similar to previous episodes in which he required hospital admission and thoracentesis.  He denies any cough, fever or chills no chest pain.  He does note increased peripheral edema.  States that he lives at home along and has little supportive care.  He is concerned that he is no longer able to care for himself.  States that home health has been set up to start on 3/23 ? ? ? ?Home Medications ?Prior to Admission medications   ?Medication Sig Start Date End Date Taking? Authorizing Provider  ?albuterol (VENTOLIN HFA) 108 (90 Base) MCG/ACT inhaler Inhale 1-2 puffs into the lungs every 4 (four) hours as needed for wheezing or shortness of breath. 02/12/45   Campbell Stall P, DO  ?amLODipine (NORVASC) 10 MG tablet Take 1 tablet (10 mg total) by mouth daily. 06/17/21   Fayrene Helper, MD  ?aspirin 81 MG chewable tablet Chew 81 mg by mouth daily.    [provider]  ?atorvastatin (LIPITOR) 40 MG tablet Take 1 tablet (40 mg total) by mouth daily. 06/17/21   Fayrene Helper, MD  ?cabozantinib (CABOMETYX) 40 MG tablet Take 1 tablet (40 mg total) by mouth daily.  Take on an empty stomach, 1 hour before or 2 hours after meals. ?Patient not taking: Reported on 08/31/2021 08/23/21   Derek Jack, MD  ?folic acid (FOLVITE) 1 MG tablet Take 1 tablet (1 mg total) by mouth daily. 05/03/21   Collier Salina, MD  ?glipiZIDE (GLUCOTROL) 10 MG tablet Take 1 tablet (10 mg total) by mouth daily before breakfast. 06/17/21   Fayrene Helper, MD  ?losartan-hydrochlorothiazide (HYZAAR) 100-25 MG tablet Take 1 tablet by mouth daily. 06/17/21   Fayrene Helper, MD  ?methotrexate (RHEUMATREX) 2.5 MG tablet Take 10 tablets (25 mg total) by mouth once a week. 07/22/21   Rice, Resa Miner, MD  ?Semaglutide (RYBELSUS) 14 MG TABS Take 14 mg by mouth daily. 04/26/21   Brita Romp, NP  ?   ? ?Allergies    ?Patient has no known allergies.   ? ?Review of Systems   ?Review of Systems  ?Constitutional:  Negative for appetite change, chills and fever.  ?Respiratory:  Positive for shortness of breath. Negative for cough.   ?Cardiovascular:  Positive for leg swelling. Negative for chest pain.  ?Gastrointestinal:  Negative for abdominal pain, constipation, diarrhea, nausea and vomiting.  ?Musculoskeletal:  Negative for arthralgias and back pain.  ?Skin:  Negative for color change and wound.  ?Neurological:  Negative for dizziness and numbness.  ?All other systems reviewed and are negative. ? ?Physical Exam ?Updated Vital  Signs ?BP (!) 157/97 (BP Location: Left Arm)   Pulse 93   Temp 97.8 ?F (36.6 ?C) (Oral)   Resp 16   Ht 6' (1.829 m)   Wt (!) 137.4 kg   SpO2 100%   BMI 41.09 kg/m?  ?Physical Exam ?Vitals and nursing note reviewed.  ?Constitutional:   ?   General: He is not in acute distress. ?   Appearance: He is well-developed. He is not toxic-appearing.  ?Cardiovascular:  ?   Rate and Rhythm: Normal rate and regular rhythm.  ?   Pulses: Normal pulses.  ?Pulmonary:  ?   Breath sounds: No wheezing.  ?   Comments: Patient on 4 L oxygen by nasal cannula, no increased work of  breathing noted, lung sounds diminished bilaterally no wheezing or rales. ?Abdominal:  ?   Palpations: Abdomen is soft.  ?   Tenderness: There is no abdominal tenderness.  ?Musculoskeletal:  ?   Right lower leg: Edema present.  ?   Left lower leg: Edema present.  ?   Comments: 2+ pitting edema bilateral lower extremities   ?Skin: ?   General: Skin is warm.  ?   Capillary Refill: Capillary refill takes less than 2 seconds.  ?   Findings: No erythema or rash.  ?Neurological:  ?   General: No focal deficit present.  ?   Mental Status: He is alert.  ?   Sensory: No sensory deficit.  ?   Motor: No weakness.  ? ? ?ED Results / Procedures / Treatments   ?Labs ?(all labs ordered are listed, but only abnormal results are displayed) ?Labs Reviewed  ?CBC WITH DIFFERENTIAL/PLATELET - Abnormal; Notable for the following components:  ?    Result Value  ? Hemoglobin 10.7 (*)   ? HCT 36.6 (*)   ? MCV 75.8 (*)   ? MCH 22.2 (*)   ? MCHC 29.2 (*)   ? RDW 17.8 (*)   ? Lymphs Abs 0.6 (*)   ? All other components within normal limits  ?COMPREHENSIVE METABOLIC PANEL - Abnormal; Notable for the following components:  ? Potassium 3.1 (*)   ? Chloride 92 (*)   ? CO2 35 (*)   ? Glucose, Bld 211 (*)   ? Calcium 8.5 (*)   ? Albumin 3.1 (*)   ? All other components within normal limits  ?RESP PANEL BY RT-PCR (FLU A&B, COVID) ARPGX2  ?BRAIN NATRIURETIC PEPTIDE  ? ? ?EKG ?EKG Interpretation ? ?Date/Time:  Tuesday September 21 2021 11:51:20 EDT ?Ventricular Rate:  88 ?PR Interval:  156 ?QRS Duration: 92 ?QT Interval:  373 ?QTC Calculation: 452 ?R Axis:   -1 ?Text Interpretation: Sinus rhythm Low voltage, precordial leads Abnormal R-wave progression, early transition No acute changes No significant change since last tracing Confirmed by Varney Biles [phone]) on 09/21/2021 12:10:51 PM ? ?Radiology ?DG Chest Portable 1 View ? ?Result Date: 09/21/2021 ?CLINICAL DATA:  Shortness of breath.  Weakness. EXAM: PORTABLE CHEST 1 VIEW COMPARISON:  One-view chest  x-ray 08/27/2021.  CTA chest 08/26/2021. FINDINGS: Heart is obscured by bilateral opacities. Progressive bilateral pleural effusions and basilar airspace disease is noted. Moderate pulmonary vascular congestion is present. IMPRESSION: Progressive bilateral pleural effusions and basilar airspace disease. Findings are concerning for multi lobar pneumonia. Electronically Signed   By: San Morelle M.D.   On: 09/21/2021 11:57   ? ?Procedures ?Procedures  ? ? ?Medications Ordered in ED ?Medications - No data to display ? ?ED Course/ Medical Decision Making/ A&P ?  ?                        ?  Medical Decision Making ?Patient here with history of metastatic renal cell carcinoma followed by oncology, Dr. Delton Coombes.  He has continuous oxygen requirement of typically 3 L.  Has history of increasing shortness of breath x3 days has increased his oxygen at home.  He also has some generalized weakness and states that he is "too weak to walk across the floor."  Lives at alone and has little to no supportive care.  He does not feel he can take care of himself at home any longer. ? ?On exam, patient appears weak, nontoxic.  Lung sounds diminished, he is currently on 4 L of oxygen by nasal cannula and O2 sats in the upper 90s to 100%.  He does have significant peripheral edema bilaterally.  Edematous to mid calf.  No excessive warmth or erythema.  Differential would include pneumonia, pleural effusion worsening malignancy.  He will likely require hospitalization. ? ? ?Amount and/or Complexity of Data Reviewed ?External Data Reviewed: notes. ?   Details: Patient admitted to this hospital in February had thoracentesis on 08/27/2021 for left pleural effusion ?Labs: ordered. ?   Details: Labs today interpreted by me, no leukocytosis, hemoglobin today 10.7, near baseline.  Chemistries show potassium of 3.1, blood sugar 211, anion gap of 12, LFTs unremarkable BNP today 34, COVID and influenza testing are negative. ?Radiology:  ordered. ?   Details: Chest x-ray today shows progressive bilateral pleural effusions and basilar airspace disease.  Findings concerning for multilobar pneumonia.  I feel this is most likely pleural effusions secondary to

## 2021-09-21 NOTE — Hospital Course (Addendum)
The patient is a 56 year old male with a history of tobacco abuse, diabetes mellitus type 2, rheumatoid arthritis, metastatic kidney cancer to the pleura status post VATS and pleural biopsy 07/23/2021 presenting with 3-day history of worsening shortness of breath.  Notably, the patient was recently mated to the hospital from 08/26/2021 to 08/27/2021 with similar symptoms.  CTA of the chest was negative for PE at that time.  He underwent left-sided thoracocentesis removing 1.1 L with symptomatic improvement.  In addition, the patient had a hospitalization from 07/18/2021 to 07/30/2021 for recurrent right-sided pleural effusion.  He subsequently underwent VATS and right pleural biopsy on 07/25/2021 performed by Dr. Cyndia Bent.  Pathology was suggestive of renal cell carcinoma.  The patient has since followed up with Dr. Delton Coombes.  The patient states that he has recently started cabometyx immunotherapy about 8 days prior to this admission.   ? ?He states that he is normally on 3 L nasal cannula at home but has become more short of breath.  He has also noted orthopnea type symptoms and increasing lower extremity edema.  Unfortunately, the patient continues to smoke.  He has a nonproductive cough.  There is no hemoptysis.  He has not had any fevers, chills, headache, neck pain, chest pain, nausea, vomiting, diarrhea, domino pain, dysuria, hematuria. ? ?In the ED, the patient was afebrile and hemodynamically stable with oxygen saturation 96-100% on 3 L.  BMP showed a sodium 139, potassium 3.1, bicarbonate 35, serum creatinine 0.95.  AST 25, ALT 21, alk phosphatase 111, total bilirubin 0.3, albumin 3.1.  WBC 6.5, hemoglobin 10.7, platelets 220,000.  BNP 34.  Chest x-ray showed bilateral pleural effusion, left greater than right.  The patient was admitted for further evaluation and treatment of his acute on chronic respiratory failure. ? ?**Interim History  ?Patient underwent a repeat thoracentesis on 09/21/2020 and he improved.  1.6  L of fluid removed.  Was also diuresed with IV Lasix with good urine output.  His respiratory status improved back to his baseline and he was deemed stable for discharge with outpatient consideration of Pleurx drain/VATS if he continues to have recurrent pleural malignant effusion.  Patient has been transitioned to oral diuresis with Lasix and has been deemed stable for discharge.  Repeat chest x-ray today showed "Unchanged small bilateral pleural effusions with evidence of ?loculation on the right. Similar bibasilar opacities, likely due to atelectasis." ?

## 2021-09-21 NOTE — Assessment & Plan Note (Addendum)
-  07/19/2021 hemoglobin A1c 8.0 ?-CBG's ranging from 149-294 ?-Patient on steroid taper and continuing po Prednisone 40 mg daily x 3 days ?-Continue to hold oral hypoglycemic agents and resume in the outpatient setting  ?-Continue Semglee 18 units daily while hospitlaized. ?-Continue NovoLog 5 units 3 times daily meal coverage.  ?-SSI.   ?

## 2021-09-21 NOTE — Procedures (Signed)
PreOperative Dx: Recurrent left pleural effusion pleural effusion ?Postoperative Dx: Recurrent left pleural effusion pleural effusion ?Procedure:   US guided LEFT thoracentesis ?Radiologist:  Thornton Papas ?Anesthesia:  10 ml of 1% lidocaine ?Specimen:  1.6 L of dark old bloody colored fluid ?EBL:   < 1 ml ?Complications: None   ?

## 2021-09-21 NOTE — Assessment & Plan Note (Addendum)
-  Now Just Obese based on BMI ?-Complicates overall prognosis and care ?-Estimated body mass index is 38.24 kg/m? as calculated from the following: ?  Height as of this encounter: 6' (1.829 m). ?  Weight as of this encounter: 127.9 kg.  ?-Weight Loss and Dietary Counseling given ? ?

## 2021-09-22 ENCOUNTER — Encounter (HOSPITAL_COMMUNITY): Payer: Self-pay | Admitting: Internal Medicine

## 2021-09-22 ENCOUNTER — Inpatient Hospital Stay (HOSPITAL_COMMUNITY): Payer: Medicaid Other

## 2021-09-22 DIAGNOSIS — Z515 Encounter for palliative care: Secondary | ICD-10-CM | POA: Diagnosis not present

## 2021-09-22 DIAGNOSIS — Z7189 Other specified counseling: Secondary | ICD-10-CM

## 2021-09-22 DIAGNOSIS — I10 Essential (primary) hypertension: Secondary | ICD-10-CM | POA: Diagnosis not present

## 2021-09-22 DIAGNOSIS — E782 Mixed hyperlipidemia: Secondary | ICD-10-CM

## 2021-09-22 DIAGNOSIS — M0580 Other rheumatoid arthritis with rheumatoid factor of unspecified site: Secondary | ICD-10-CM

## 2021-09-22 DIAGNOSIS — E876 Hypokalemia: Secondary | ICD-10-CM | POA: Diagnosis not present

## 2021-09-22 DIAGNOSIS — C7800 Secondary malignant neoplasm of unspecified lung: Secondary | ICD-10-CM | POA: Diagnosis not present

## 2021-09-22 DIAGNOSIS — J9621 Acute and chronic respiratory failure with hypoxia: Secondary | ICD-10-CM | POA: Diagnosis not present

## 2021-09-22 DIAGNOSIS — J90 Pleural effusion, not elsewhere classified: Secondary | ICD-10-CM

## 2021-09-22 DIAGNOSIS — J441 Chronic obstructive pulmonary disease with (acute) exacerbation: Secondary | ICD-10-CM | POA: Diagnosis not present

## 2021-09-22 LAB — CBC
HCT: 37.5 % — ABNORMAL LOW (ref 39.0–52.0)
Hemoglobin: 10.5 g/dL — ABNORMAL LOW (ref 13.0–17.0)
MCH: 20.8 pg — ABNORMAL LOW (ref 26.0–34.0)
MCHC: 28 g/dL — ABNORMAL LOW (ref 30.0–36.0)
MCV: 74.1 fL — ABNORMAL LOW (ref 80.0–100.0)
Platelets: 218 10*3/uL (ref 150–400)
RBC: 5.06 MIL/uL (ref 4.22–5.81)
RDW: 17.6 % — ABNORMAL HIGH (ref 11.5–15.5)
WBC: 6.7 10*3/uL (ref 4.0–10.5)
nRBC: 0 % (ref 0.0–0.2)

## 2021-09-22 LAB — PROTEIN, BODY FLUID (OTHER): Total Protein, Body Fluid Other: 4.9 g/dL

## 2021-09-22 LAB — BASIC METABOLIC PANEL
Anion gap: 9 (ref 5–15)
BUN: 11 mg/dL (ref 6–20)
CO2: 36 mmol/L — ABNORMAL HIGH (ref 22–32)
Calcium: 8.3 mg/dL — ABNORMAL LOW (ref 8.9–10.3)
Chloride: 92 mmol/L — ABNORMAL LOW (ref 98–111)
Creatinine, Ser: 0.91 mg/dL (ref 0.61–1.24)
GFR, Estimated: >60 mL/min (ref >60)
Glucose, Bld: 324 mg/dL — ABNORMAL HIGH (ref 70–99)
Potassium: 3.5 mmol/L (ref 3.5–5.1)
Sodium: 137 mmol/L (ref 135–145)

## 2021-09-22 LAB — GLUCOSE, CAPILLARY
Glucose-Capillary: 283 mg/dL — ABNORMAL HIGH (ref 70–99)
Glucose-Capillary: 261 mg/dL — ABNORMAL HIGH (ref 70–99)
Glucose-Capillary: 167 mg/dL — ABNORMAL HIGH (ref 70–99)
Glucose-Capillary: 154 mg/dL — ABNORMAL HIGH (ref 70–99)

## 2021-09-22 LAB — BRAIN NATRIURETIC PEPTIDE: B Natriuretic Peptide: 29 pg/mL (ref 0.0–100.0)

## 2021-09-22 MED ORDER — PANTOPRAZOLE SODIUM 40 MG PO TBEC
40.0000 mg | DELAYED_RELEASE_TABLET | Freq: Every day | ORAL | Status: DC
Start: 2021-09-22 — End: 2021-09-30
  Administered 2021-09-22 – 2021-09-29 (×7): 40 mg via ORAL
  Filled 2021-09-22 (×8): qty 1

## 2021-09-22 MED ORDER — FUROSEMIDE 10 MG/ML IJ SOLN: 20.0000 mg | Freq: Once | INTRAMUSCULAR | Status: DC

## 2021-09-22 MED ORDER — LORATADINE 10 MG PO TABS
10.0000 mg | ORAL_TABLET | Freq: Every day | ORAL | Status: DC
  Administered 2021-09-22 – 2021-09-29 (×7): 10 mg via ORAL
  Filled 2021-09-22 (×8): qty 1

## 2021-09-22 MED ORDER — INSULIN GLARGINE-YFGN 100 UNIT/ML ~~LOC~~ SOLN
10.0000 [IU] | Freq: Every day | SUBCUTANEOUS | Status: DC
  Administered 2021-09-22: 10 [IU] via SUBCUTANEOUS
  Filled 2021-09-22 (×3): qty 0.1

## 2021-09-22 MED ORDER — SEMAGLUTIDE 14 MG PO TABS
14.0000 mg | ORAL_TABLET | Freq: Every day | ORAL | Status: DC
  Administered 2021-09-22 – 2021-09-29 (×8): 14 mg via ORAL
  Filled 2021-09-22 (×2): qty 1

## 2021-09-22 MED ORDER — FLUTICASONE PROPIONATE 50 MCG/ACT NA SUSP
2.0000 | Freq: Every day | NASAL | Status: DC
  Administered 2021-09-23 – 2021-09-29 (×5): 2 via NASAL
  Filled 2021-09-22 (×2): qty 16

## 2021-09-22 NOTE — [place] FL2 (Signed)
?[place] MEDICAID FL2 LEVEL OF CARE SCREENING TOOL  ?  ? ?IDENTIFICATION  ?Patient Name: ?Derek Owen Birthdate: 26-May-1966 Sex: male Admission Date (Current Location): ?09/21/2021  ?[place] and [place] Number: ? [place] and Address:  ?[facility] [address], [place] ?     Provider Number: ?[id number]  ?Attending Physician Name and Address:  ?Eugenie Filler, MD ? Relative Name and Phone Number:  ?  ?   ?Current Level of Care: ?Hospital Recommended Level of Care: ?[place] Prior Approval Number: ?  ? ?Date Approved/Denied: ?  PASRR Number: ?[id number] ? ?Discharge Plan: ?SNF ?  ? ?Current Diagnoses: ?Patient Active Problem List  ? Diagnosis Date Noted  ? Acute on chronic respiratory failure with hypoxia ([place]) 09/21/2021  ? COPD with acute exacerbation ([place]) 09/21/2021  ? Tobacco abuse 09/21/2021  ? Mixed hyperlipidemia 09/21/2021  ? Leg edema 09/21/2021  ? Hypokalemia 09/21/2021  ? Hospital discharge follow-up 08/31/2021  ? Pneumothorax 08/13/2021  ? Metastasis to lung ([place]) 08/13/2021  ? Cancer of kidney parenchyma, left ([place]) 08/09/2021  ? Iron deficiency anemia 07/29/2021  ? Morbid obesity ([place]) 07/28/2021  ? Acute respiratory failure with hypoxia ([place]) 07/19/2021  ? Recurrent pleural effusion 07/19/2021  ? Mass of abdomen 07/19/2021  ? Uncontrolled type 2 diabetes mellitus with hyperglycemia ([place]) 11/17/2020  ? Essential hypertension 11/17/2020  ? RBC microcytosis 10/22/2020  ? High risk medication use 10/13/2020  ? Polyarthritis with positive rheumatoid factor ([place]) 10/12/2020  ? Immunization due 10/08/2020  ? Gout 08/31/2020  ? ? ?Orientation RESPIRATION BLADDER Height & Weight   ?  ?Self, Time, Situation, Place ? O2 (4L) Continent Weight: 295 lb 10.2 oz (134.1 kg) ?Height:  6' (182.9 cm)  ?BEHAVIORAL SYMPTOMS/MOOD NEUROLOGICAL BOWEL NUTRITION STATUS  ?    Continent Diet (Carb modified)  ?AMBULATORY STATUS COMMUNICATION OF NEEDS Skin    ?Extensive Assist Verbally Normal ?  ?  ?  ?    ?     ?     ? ? ?Personal Care Assistance Level of Assistance  ?Bathing, Feeding, Dressing Bathing Assistance: Limited assistance ?Feeding assistance: Independent ?Dressing Assistance: Limited assistance ?   ? ?Functional Limitations Info  ?Sight, Hearing, Speech Sight Info: Impaired ?Hearing Info: Adequate ?Speech Info: Adequate  ? ? ?SPECIAL CARE FACTORS FREQUENCY  ?PT (By licensed PT), OT (By licensed OT)   ?  ?PT Frequency: 5 times weekly ?OT Frequency: 5 times weekly ?  ?  ?  ?   ? ? ?Contractures Contractures Info: Not present  ? ? ?Additional Factors Info  ?Code Status, Allergies Code Status Info: DNR ?Allergies Info: NKA ?  ?  ?  ?   ? ?Current Medications (09/22/2021):  This is the current hospital active medication list ?Current Facility-Administered Medications  ?Medication Dose Route Frequency Provider Last Rate Last Admin  ? acetaminophen (TYLENOL) tablet 650 mg  650 mg Oral Q6H PRN Tat, David, MD      ? Or  ? acetaminophen (TYLENOL) suppository 650 mg  650 mg Rectal Q6H PRN Tat, Shanon Brow, MD      ? amLODipine (NORVASC) tablet 10 mg  10 mg Oral Daily Tat, David, MD   10 mg at 09/22/21 5397  ? aspirin chewable tablet 81 mg  81 mg Oral Daily Tat, Shanon Brow, MD   81 mg at 09/22/21 6734  ? atorvastatin (LIPITOR) tablet 40 mg  40 mg Oral q1800 Orson Eva, MD      ?  budesonide (PULMICORT) nebulizer solution 0.5 mg  0.5 mg Nebulization BID Tat, David, MD   0.5 mg at 09/22/21 0931  ? cabozantinib (CABOMETYX) tablet 40 mg  40 mg Oral Daily Tat, David, MD      ? enoxaparin (LOVENOX) injection 70 mg  70 mg Subcutaneous Q24H Orson Eva, MD   70 mg at 09/21/21 2155  ? folic acid (FOLVITE) tablet 1 mg  1 mg Oral Daily Tat, David, MD   1 mg at 09/22/21 0277  ? insulin aspart (novoLOG) injection 0-15 Units  0-15 Units Subcutaneous TID WC Orson Eva, MD   8 Units at 09/22/21 1243  ? insulin aspart (novoLOG) injection 0-5 Units  0-5 Units Subcutaneous Benay Pike, MD   2 Units  at 09/21/21 2155  ? insulin aspart (novoLOG) injection 3 Units  3 Units Subcutaneous TID WC Orson Eva, MD   3 Units at 09/22/21 1244  ? insulin glargine-yfgn (SEMGLEE) injection 10 Units  10 Units Subcutaneous Daily Eugenie Filler, MD   10 Units at 09/22/21 1244  ? ipratropium-albuterol (DUONEB) 0.5-2.5 (3) MG/3ML nebulizer solution 3 mL  3 mL Nebulization Q6H Tat, Shanon Brow, MD   3 mL at 09/22/21 1455  ? losartan (COZAAR) tablet 100 mg  100 mg Oral Daily Tat, David, MD   100 mg at 09/22/21 4128  ? methylPREDNISolone sodium succinate (SOLU-MEDROL) 125 mg/2 mL injection 120 mg  120 mg Intravenous Q24H Orson Eva, MD   120 mg at 09/21/21 1838  ? ondansetron (ZOFRAN) tablet 4 mg  4 mg Oral Q6H PRN Tat, David, MD      ? Or  ? ondansetron (ZOFRAN) injection 4 mg  4 mg Intravenous Q6H PRN Tat, Shanon Brow, MD      ? Semaglutide TABS 14 mg  14 mg Oral Daily Eugenie Filler, MD   14 mg at 09/22/21 1606  ? ? ? ?Discharge Medications: ?Please see discharge summary for a list of discharge medications. ? ?Relevant Imaging Results: ? ?Relevant Lab Results: ? ? ?Additional Information ?SSn: [phone] ? ?Iona Beard, LCSWA ? ? ? ? ?

## 2021-09-22 NOTE — Evaluation (Signed)
Physical Therapy Evaluation ?Patient Details ?Name: Derek Owen ?MRN: [id number] ?DOB: 1966-03-24 ?Today's Date: 09/22/2021 ? ?History of Present Illness ? Derek Owen is a 56 year old male with a history of tobacco abuse, diabetes mellitus type 2, rheumatoid arthritis, metastatic kidney cancer to the pleura status post VATS and pleural biopsy 07/23/2021 presenting with 3-day history of worsening shortness of breath.  Notably, the patient was recently mated to the hospital from 08/26/2021 to 08/27/2021 with similar symptoms.  CTA of the chest was negative for PE at that time.  He underwent left-sided thoracocentesis removing 1.1 L with symptomatic improvement.  In addition, the patient had a hospitalization from 07/18/2021 to 07/30/2021 for recurrent right-sided pleural effusion.  He subsequently underwent VATS and right pleural biopsy on 07/25/2021 performed by Dr. Cyndia Bent.  Pathology was suggestive of renal cell carcinoma.  The patient has since followed up with Dr. Delton Coombes.  The patient states that he has recently started cabometyx immunotherapy about 8 days prior to this admission.  He states that he is normally on 3 L nasal cannula at home but has become more short of breath.  He has also noted orthopnea type symptoms and increasing lower extremity edema.  Unfortunately, the patient continues to smoke.  He has a nonproductive cough.  There is no hemoptysis.  He has not had any fevers, chills, headache, neck pain, chest pain, nausea, vomiting, diarrhea, domino pain, dysuria, hematuria. ?  ?Clinical Impression ? Patient demonstrates slightly labored movement for sitting up at bedside with [facility] raised and Min guard assist to help pull self to sitting. Patient unsteady on feet and required use of RW for safety demonstrating slow labored cadence with leaning over RW for support due to fatigue and required a standing rest break due to SOB before making it back to room.  Patient on 3 LPM O2 with SpO2 at 92-96% during  ambulation and tolerated sitting up at bedside after therapy.  Patient will benefit from continued skilled physical therapy in hospital and recommended venue below to increase strength, balance, endurance for safe ADLs and gait.  ?   ?   ? ?Recommendations for follow up therapy are one component of a multi-disciplinary discharge planning process, led by the attending physician.  Recommendations may be updated based on patient status, additional functional criteria and insurance authorization. ? ?Follow Up Recommendations Skilled nursing-short term rehab (<3 hours/day) ? ?  ?Assistance Recommended at Discharge Set up Supervision/Assistance  ?Patient can return home with the following ? A little help with walking and/or transfers;A little help with bathing/dressing/bathroom;Help with stairs or ramp for entrance;Assistance with cooking/housework ? ?  ?Equipment Recommendations Rolling walker (2 wheels)  ?Recommendations for Other Services ?    ?  ?Functional Status Assessment Patient has had a recent decline in their functional status and demonstrates the ability to make significant improvements in function in a reasonable and predictable amount of time.  ? ?  ?Precautions / Restrictions Precautions ?Precautions: Fall ?Restrictions ?Weight Bearing Restrictions: No  ? ?  ? ?Mobility ? Bed Mobility ?Overal bed mobility: Needs Assistance ?Bed Mobility: Supine to Sit ?  ?  ?Supine to sit: Min guard, HOB elevated ?  ?  ?General bed mobility comments: labored movement, slightly increased time ?  ? ?Transfers ?Overall transfer level: Needs assistance ?Equipment used: Rolling walker (2 wheels) ?Transfers: Sit to/from Stand, Bed to chair/wheelchair/BSC ?Sit to Stand: Min guard, Min assist ?  ?Step pivot transfers: Min guard, Min assist ?  ?  ?  ?General  transfer comment: unsteady labored movement ?  ? ?Ambulation/Gait ?Ambulation/Gait assistance: Min guard, Min assist ?Gait Distance (Feet): 50 Feet ?Assistive device: Rolling  walker (2 wheels) ?Gait Pattern/deviations: Decreased step length - right, Decreased step length - left, Decreased stride length ?Gait velocity: decreased ?  ?  ?General Gait Details: unsteady on feet taking steps without AD, required use of RW for safety demonstrating labored cadence requiring a standing rest break due to SOB and fatigue ? ?Stairs ?  ?  ?  ?  ?  ? ?Wheelchair Mobility ?  ? ?Modified Rankin (Stroke Patients Only) ?  ? ?  ? ?Balance Overall balance assessment: Needs assistance ?Sitting-balance support: Feet supported, No upper extremity supported ?Sitting balance-Leahy Scale: Good ?Sitting balance - Comments: seated at EOB ?  ?Standing balance support: During functional activity, No upper extremity supported ?Standing balance-Leahy Scale: Fair ?Standing balance comment: fair/good using RW ?  ?  ?  ?  ?  ?  ?  ?  ?  ?  ?  ?   ? ? ? ?Pertinent Vitals/Pain Pain Assessment ?Pain Assessment: No/denies pain  ? ? ?Home Living Family/patient expects to be discharged to:: Private residence ?Living Arrangements: Alone ?Available Help at Discharge: Family;Friend(s);Available PRN/intermittently ?Type of Home: Mobile home ?Home Access: Stairs to enter ?Entrance Stairs-Rails: Right ?  ?  ?Home Layout: One level ?Home Equipment: None ?   ?  ?Prior Function Prior Level of Function : Independent/Modified Independent ?  ?  ?  ?  ?  ?  ?Mobility Comments: Hydrographic surveyor, drives, "worked" as a Administrator, on disability ?ADLs Comments: Independent ?  ? ? ?Hand Dominance  ?   ? ?  ?Extremity/Trunk Assessment  ? Upper Extremity Assessment ?Upper Extremity Assessment: Defer to OT evaluation ?  ? ?Lower Extremity Assessment ?Lower Extremity Assessment: Generalized weakness ?  ? ?Cervical / Trunk Assessment ?Cervical / Trunk Assessment: Normal  ?Communication  ? Communication: No difficulties  ?Cognition Arousal/Alertness: Awake/alert ?Behavior During Therapy: [facility] for tasks assessed/performed ?Overall Cognitive Status:  Within Functional Limits for tasks assessed ?  ?  ?  ?  ?  ?  ?  ?  ?  ?  ?  ?  ?  ?  ?  ?  ?  ?  ?  ? ?  ?General Comments   ? ?  ?Exercises    ? ?Assessment/Plan  ?  ?PT Assessment Patient needs continued PT services  ?PT Problem List Decreased strength;Decreased activity tolerance;Decreased balance;Decreased mobility ? ?   ?  ?PT Treatment Interventions DME instruction;Gait training;Stair training;Functional mobility training;Therapeutic activities;Therapeutic exercise;Balance training;Patient/family education   ? ?PT Goals (Current goals can be found in the Care Plan section)  ?Acute Rehab PT Goals ?Patient Stated Goal: return home after rehab ?PT Goal Formulation: With patient ?Time For Goal Achievement: 10/06/21 ?Potential to Achieve Goals: Good ? ?  ?Frequency Min 3X/week ?  ? ? ?Co-evaluation   ?  ?  ?  ?  ? ? ?  ?AM-PAC PT "6 Clicks" Mobility  ?Outcome Measure Help needed turning from your back to your side while in a flat bed without using bedrails?: None ?Help needed moving from lying on your back to sitting on the side of a flat bed without using bedrails?: A Little ?Help needed moving to and from a bed to a chair (including a wheelchair)?: A Little ?Help needed standing up from a chair using your arms (e.g., wheelchair or bedside chair)?: A Little ?Help needed to walk  in hospital room?: A Little ?Help needed climbing 3-5 steps with a railing? : A Lot ?6 Click Score: 18 ? ?  ?End of Session Equipment Utilized During Treatment: Oxygen ?Activity Tolerance: Patient tolerated treatment well;Patient limited by fatigue ?Patient left: in bed;with call bell/phone within reach ?Nurse Communication: Mobility status ?PT Visit Diagnosis: Unsteadiness on feet (R26.81);Other abnormalities of gait and mobility (R26.89);Muscle weakness (generalized) (M62.81) ?  ? ?Time: 5831-6742 ?PT Time Calculation (min) (ACUTE ONLY): 28 min ? ? ?Charges:   PT Evaluation ?$PT Eval Moderate Complexity: 1 Mod ?PT  Treatments ?$Therapeutic Activity: 23-37 mins ?  ?   ? ? ?3:48 PM, 09/22/21 ?Lonell Grandchild, MPT ?Physical Therapist with [place] ?[facility] ?[phone] office ?[phone] mobile phone ? ? ?

## 2021-09-22 NOTE — Progress Notes (Signed)
?PROGRESS NOTE ? ? ? ?Derek Owen  EHU:[id number] DOB: 07/01/1966 DOA: 09/21/2021 ?PCP: Renee Rival, FNP  ? ? ?Chief Complaint  ?Patient presents with  ? Shortness of Breath  ? ? 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  Notably, the patient was recently mated to the hospital from 08/26/2021 to 08/27/2021 with similar symptoms.  CTA of the chest was negative for PE at that time.  He underwent left-sided thoracocentesis removing 1.1 L with symptomatic improvement.  In addition, the patient had a hospitalization from 07/18/2021 to 07/30/2021 for recurrent right-sided pleural effusion.  He subsequently underwent VATS and right pleural biopsy on 07/25/2021 performed by Dr. Cyndia Bent.  Pathology was suggestive of renal cell carcinoma.  The patient has since followed up with Dr. Delton Coombes.  The patient states that he has recently started cabometyx immunotherapy about 8 days prior to this admission.  He states that he is normally on 3 L nasal cannula at home but has become more short of breath.  He has also noted orthopnea type symptoms and increasing lower extremity edema.  Unfortunately, the patient continues to smoke.  He has a nonproductive cough.  There is no hemoptysis.  He has not had any fevers, chills, headache, neck pain, chest pain, nausea, vomiting, diarrhea, domino pain, dysuria, hematuria. ?In the ED, the patient was afebrile and hemodynamically stable with oxygen saturation 96-100% on 3 L.  BMP showed a sodium 139, potassium 3.1, bicarbonate 35, serum creatinine 0.95.  AST 25, ALT 21, alk phosphatase 111, total bilirubin 0.3, albumin 3.1.  WBC 6.5, hemoglobin 10.7, platelets 220,000.  BNP 34.  Chest x-ray showed bilateral pleural effusion, left greater than right.  The patient was  admitted for further evaluation and treatment of his acute on chronic respiratory failure.  ? ? ?Assessment & Plan: ? Principal Problem: ?  Acute on chronic respiratory failure with hypoxia (HCC) ?Active Problems: ?  Recurrent pleural effusion ?  COPD with acute exacerbation ([place]) ?  Metastasis to lung [facility]) ?  Polyarthritis with positive rheumatoid factor (HCC) ?  Uncontrolled type 2 diabetes mellitus with hyperglycemia ([place]) ?  Essential hypertension ?  Morbid obesity ([place]) ?  Tobacco abuse ?  Mixed hyperlipidemia ?  Leg edema ?  Hypokalemia ? ? ? ?Assessment and Plan: ?* Acute on chronic respiratory failure with hypoxia (HCC) ?-Multifactorial secondary to recurrent malignant pleural effusions, COPD exacerbation, and deconditioning ?-Status post therapeutic ultrasound-guided thoracentesis 09/21/2021 per IR with 1.6 L of fluid removed.  ?-Gram stain of pleural fluid with no organisms seen.  Cultures pending.  ?-Patient states clinical improvement postthoracentesis and had one done in February 2023. ?-May need a Pleurx drain if continued recurrent malignant pleural effusion. ?-Continue Pulmicort nebs, scheduled DuoNebs, IV Solu-Medrol. ?-Primary oncologist notified of admission via epic. ?Limited echo to rule out pericardial effusion/tamponade ?Stable on 4 L nasal cannula ?Wean oxygen as tolerated back to baseline 3 L ?With BNP 34--low suspicion of CHF  ? ?Recurrent pleural effusion ?Patient is status post VATS and R-pleural bx 07/25/21--Dr. Cyndia Bent ?-Requested for left-sided thoracocentesis 3/14--send fluid for cell count, culture, LDH, protein ?-May need to consider VATS/Pleurx catheter if continued reaccumulation ?-Last thoracocentesis 08/27/2021 removing 1.1 L ?-Hematologist/oncologist notified of admission via epic. ? ?COPD with acute exacerbation ([place]) ?-Continue scheduled DuoNebs, Pulmicort, IV Solu-Medrol.  -Place on  PPI, Claritin. ? ?Metastasis to lung [facility]) ?Patient follows Dr. Delton Coombes ?He has known renal  cell carcinoma with metastasis to the lung ?Patient states that he started immunotherapy 8 days prior to this admission. ?-Patient states oncologist told him he had only a few months to live and seem to be at peace with this. ?-Patient states " he will be going home soon" ?-I have added Dr. Delton Coombes to the patient care team and placed in a formal consultation. ?-Consulted palliative care for goals of care. ? ?Hypokalemia ?-Potassium at 3.5.   ?-Check a magnesium in the AM.   ?-Repeat labs in the AM. ? ?Leg edema ?Lower extremity Dopplers done 08/27/2021 negative for DVT. ?Albumin level at 3.1. ?-Follow. ? ?Mixed hyperlipidemia ?-Statin.  ? ?Tobacco abuse ?Tobacco cessation discussed ? ?Morbid obesity ([place]) ?BMI 41.09 ?Lifestyle modification. ?Outpatient follow-up with ? ?Essential hypertension ?-Controlled on current regimen of Norvasc, losartan.  ? ?Uncontrolled type 2 diabetes mellitus with hyperglycemia ([place]) ?07/19/2021 hemoglobin A1c 8.0 ?-CBG 283 this morning. ?-Patient on IV steroids ?-Continue to hold oral hypoglycemic agents. ?-Semglee 10 units daily. ?-NovoLog 3 units 3 times daily with meals, SSI. ? ?Polyarthritis with positive rheumatoid factor (HCC) ?Patient takes methotrexate once weekly ?-Follow-up Dr. Benjamine Mola ? ? ? ? ?  ? ? ?DVT prophylaxis: Lovenox ?Code Status: DNR ?Family Communication: Updated patient.  No family at bedside. ?Disposition: To be determined ? ?Status is: Inpatient ?Remains inpatient appropriate because: Severity of ?  ?Consultants:  ?Interventional radiology ?Hematology/oncology ?Palliative care ? ?Procedures:  ?Chest x-ray 09/21/2021 ?Ultrasound-guided thoracentesis left yielding 1.6 L of pleural fluid per IR, Dr. Thornton Papas 09/21/2021 ? ?Antimicrobials:  ?Azithromycin 500 mg p.o. x1 09/21/2021 ?IV Rocephin 3//14 /2023x1 dose ? ? ?Subjective: ?Sleeping but easily arousable.  Denies any chest pain.  States shortness of breath improved significantly after thoracentesis done yesterday.  States  was told by his oncologist he is only about 4 to 6 months to live and is enjoying every day.  States " I'll  be going home soon." ? ?Objective: ?Vitals:  ? 09/22/21 0748 09/22/21 0929 09/22/21 1321 09/22/21 1457  ?BP: 138/89  124/83   ?Pulse: (!) 101  99   ?Resp: 20  20   ?Temp: 98.5 ?F (36.9 ?C)  98.3 ?F (36.8 ?C)   ?TempSrc: Oral  Oral   ?SpO2: 100% 95% 99% 92%  ?Weight:      ?Height:      ? ? ?Intake/Output Summary (Last 24 hours) at 09/22/2021 2016 ?Last data filed at 09/22/2021 1300 ?Gross per 24 hour  ?Intake 240 ml  ?Output 350 ml  ?Net -110 ml  ? ?Filed Weights  ? 09/21/21 1111 09/21/21 1826  ?Weight: (!) 137.4 kg 134.1 kg  ? ? ?Examination: ? ?General exam: Appears calm and comfortable  ?Respiratory system: Scattered coarse breath sounds.  Breath sounds in the left base diminished.  Minimal to mild expiratory wheezing.  Speaking in full sentences.   ?Cardiovascular system: S1 & S2 heard, RRR. No JVD, murmurs, rubs, gallops or clicks.  1-2+ bilateral lower extremity edema.  ?Gastrointestinal system: Abdomen is nondistended, soft and nontender. No organomegaly or masses felt. Normal bowel sounds heard. ?Central nervous system: Alert and oriented. No focal neurological deficits. ?Extremities: Symmetric 5 x 5 power. ?Skin: No rashes, lesions or ulcers ?Psychiatry: Judgement and insight appear normal. Mood & affect appropriate.  ? ? ? ?Data Reviewed:  ? ?CBC: ?Recent Labs  ?Lab 09/21/21 ?1137 09/22/21 ?0543  ?WBC 6.5 6.7  ?NEUTROABS 5.3  --   ?  HGB 10.7* 10.5*  ?HCT 36.6* 37.5*  ?MCV 75.8* 74.1*  ?PLT 220 218  ? ? ?Basic Metabolic Panel: ?Recent Labs  ?Lab 09/21/21 ?1137 09/22/21 ?0543  ?NA 139 137  ?K 3.1* 3.5  ?CL 92* 92*  ?CO2 35* 36*  ?GLUCOSE 211* 324*  ?BUN 10 11  ?CREATININE 0.95 0.91  ?CALCIUM 8.5* 8.3*  ? ? ?GFR: ?Estimated Creatinine Clearance: 126.9 mL/min (by C-G formula based on SCr of 0.91 mg/dL). ? ?Liver Function Tests: ?Recent Labs  ?Lab 09/21/21 ?1137  ?AST 25  ?ALT 21  ?ALKPHOS 111  ?BILITOT 0.3   ?PROT 7.8  ?ALBUMIN 3.1*  ? ? ?CBG: ?Recent Labs  ?Lab 09/21/21 ?1824 09/21/21 ?2113 09/22/21 ?4276 09/22/21 ?1109 09/22/21 ?1637  ?GLUCAP 216* 210* 283* 261* 167*  ? ? ? ?Recent Results (from the past 24

## 2021-09-22 NOTE — Consult Note (Signed)
? ?                                                                                ?Consultation Note ?Date: 09/22/2021  ? ?Patient Name: Derek Owen  ?DOB: 06/28/1966  MRN: [id number]  Age / Sex: 56 y.o., male  ?PCP: Renee Rival, FNP ?Referring Physician: Eugenie Filler, MD ? ?Reason for Consultation: Establishing goals of care ? ?HPI/Patient Profile: 56 y.o. male  with past medical history of tobacco abuse current daily pack per day smoker/COPD, HTN/HLD, alcohol use disorder mild, DM2, RA, metastatic kidney cancer with burden to pleural status post VATS and pleural biopsy 07/23/2021, hospitalized 1/8 through 20 for recurrent right-sided pleural effusion with VATS and right pleural biopsy suggestive of renal cell carcinoma, also hospitalized 2/16 through 17 with left-sided thoracentesis removing 1.1 L, active with Dr. Raliegh Ip on oral immunotherapy, admitted on 09/21/2021 with acute on chronic respiratory failure with hypoxia multifactorial including recurrent malignant pleural effusion, COPD exacerbation and deconditioning.  ? ?Clinical Assessment and Goals of Care: ?I have reviewed medical records including EPIC notes, labs and imaging, received report from RN, assessed the patient.  Derek Owen is sitting up on the edge of the bed.  He greets me, making and mostly keeping eye contact.  He appears relatively stable and strong, obese.  He is alert and oriented, able to make his basic needs known.  There is no family at bedside at this time. ? ?We met at the bedside to discuss diagnosis prognosis, GOC, EOL wishes, disposition and options.  I introduced Palliative Medicine as specialized medical care for people living with serious illness. It focuses on providing relief from the symptoms and stress of a serious illness. The goal is to improve quality of life for both the patient and the family. ? ?We discussed a brief life review of the patient.  Derek Owen tells me that he has been a long-distance truck driver  for many years.  He shares that he gave his job noticed that he was quitting today.  He tells me that he lives alone, in a single wide trailer for the last 10 years.  He describes the conditions of the trailer as poor, with stove and microwave not working, using kerosene heater for heat.  He is single, has 1 daughter who lives in [place] for the last 3 years.  ? ?We then focused on their current illness.  Derek Owen tells me, "I am dying".  The natural disease trajectory and expectations at EOL were discussed. ? ?Advanced directives, concepts specific to code status, were considered and discussed.  Derek Owen tells me that he would not want attempted resuscitation.  He agrees to DNR.  I encouraged him to share these choices with his responsible party/healthcare agent. ? ?Hospice and Palliative Care services outpatient were explained and offered.  Derek Owen is interested in out patient palliative care.  Cancer center RN shares that they have attempted to connect Derek Owen with palliative services.  TOC shares that there is a delay in out patient palliative services.  ? ?Discussed the importance of continued conversation with family and the medical providers regarding overall plan of care and  treatment options, ensuring decisions are within the context of the patient?s values and GOCs.  Questions and concerns were addressed.  The family was encouraged to call with questions or concerns.  PMT will continue to support holistically. ? ?Conference with attending, oncologist, bedside nursing staff, transition of care team related to patient condition, needs, goals of care, disposition.  ? ? ?HCPOA  ?NEXT OF KIN -Derek Owen shares that he would want for his cousin, Wyona Almas, to be his healthcare surrogate.  I encouraged him to complete healthcare power of attorney paperwork with our chaplain service.  He does have a daughter who lives in [place] for the last 3 years. ?  ? ?SUMMARY OF RECOMMENDATIONS   ?At this point  continue to treat the treatable but no CPR or intubation. ?Considering stopping oral immunotherapy ?Outpatient palliative services versus hospice care. ? ? ?Code Status/Advance Care Planning: ?DNR ? ?Symptom Management:  ?Per hospitalist, no additional needs at this time. ? ?Palliative Prophylaxis:  ?Frequent Pain Assessment ? ?Additional Recommendations (Limitations, Scope, Preferences): ?Full Scope Treatment ? ?Psycho-social/Spiritual:  ?Desire for further Chaplaincy support:no ?Additional Recommendations: Caregiving  Support/Resources and Education on Hospice ? ?Prognosis:  ?< 3 months, would not be surprising based on metastatic cancer burden, recurrent pleural effusion, decreasing functional status. ? ?Discharge Planning:  Attempting short-term rehab, outpatient palliative services.   ? ?  ? ?Primary Diagnoses: ?Present on Admission: ? Acute on chronic respiratory failure with hypoxia ([place]) ? Uncontrolled type 2 diabetes mellitus with hyperglycemia ([place]) ? Morbid obesity ([place]) ? Polyarthritis with positive rheumatoid factor (H[facility]) ? Recurrent pleural effusion ? Metastasis to lung [facility]) ? ? ?I have reviewed the medical record, interviewed the patient and family, and examined the patient. The following aspects are pertinent. ? ?Past Medical History:  ?Diagnosis Date  ? Alcohol use disorder, mild, abuse   ? Cancer of kidney ([place])   ? Cigarette smoker   ? Diabetes mellitus without complication ([place])   ? Hyperlipidemia   ? Hypertension   ? Lung cancer ([place])   ? Polycythemia 04/13/2020  ? Stroke [facility])   ? x3  ? ?Social History  ? ?Socioeconomic History  ? Marital status: Single  ?  Spouse name: Not on file  ? Number of children: Not on file  ? Years of education: Not on file  ? Highest education level: Not on file  ?Occupational History  ? Not on file  ?Tobacco Use  ? Smoking status: Every Day  ?  Packs/day: 1.00  ?  Types: Cigarettes  ? Smokeless tobacco: Never  ?Vaping Use  ? Vaping Use: Never used  ?Substance and  Sexual Activity  ? Alcohol use: Yes  ?  Comment: Occasional  ? Drug use: Not Currently  ? Sexual activity: Not Currently  ?  Birth control/protection: Other-see comments  ?Other Topics Concern  ? Not on file  ?Social History Narrative  ? Not on file  ? ?Social Determinants of Health  ? ?Financial Resource Strain: Not on file  ?Food Insecurity: Not on file  ?Transportation Needs: Not on file  ?Physical Activity: Not on file  ?Stress: Not on file  ?Social Connections: Not on file  ? ?Family History  ?Adopted: Yes  ? ?Scheduled Meds: ? amLODipine  10 mg Oral Daily  ? aspirin  81 mg Oral Daily  ? atorvastatin  40 mg Oral q1800  ? budesonide (PULMICORT) nebulizer solution  0.5 mg Nebulization BID  ? cabozantinib  40 mg Oral Daily  ? enoxaparin (LOVENOX)  injection  70 mg Subcutaneous Q22I  ? folic acid  1 mg Oral Daily  ? insulin aspart  0-15 Units Subcutaneous TID WC  ? insulin aspart  0-5 Units Subcutaneous QHS  ? insulin aspart  3 Units Subcutaneous TID WC  ? insulin glargine-yfgn  10 Units Subcutaneous Daily  ? ipratropium-albuterol  3 mL Nebulization Q6H  ? losartan  100 mg Oral Daily  ? methylPREDNISolone (SOLU-MEDROL) injection  120 mg Intravenous Q24H  ? Semaglutide  14 mg Oral Daily  ? ?Continuous Infusions: ?PRN Meds:.acetaminophen **OR** acetaminophen, ondansetron **OR** ondansetron (ZOFRAN) IV ?Medications Prior to Admission:  ?Prior to Admission medications   ?Medication Sig Start Date End Date Taking? Authorizing Provider  ?albuterol (VENTOLIN HFA) 108 (90 Base) MCG/ACT inhaler Inhale 1-2 puffs into the lungs every 4 (four) hours as needed for wheezing or shortness of breath. 07/11/44  Yes Campbell Stall P, DO  ?amLODipine (NORVASC) 10 MG tablet Take 1 tablet (10 mg total) by mouth daily. 06/17/21  Yes Fayrene Helper, MD  ?aspirin 81 MG chewable tablet Chew 81 mg by mouth daily.   Yes [provider]  ?atorvastatin (LIPITOR) 40 MG tablet Take 1 tablet (40 mg total) by mouth daily. 06/17/21  Yes  Fayrene Helper, MD  ?cabozantinib (CABOMETYX) 40 MG tablet Take 1 tablet (40 mg total) by mouth daily. Take on an empty stomach, 1 hour before or 2 hours after meals. 08/23/21  Yes Derek Jack, MD

## 2021-09-22 NOTE — Plan of Care (Signed)
?  Problem: Acute Rehab PT Goals(only PT should resolve) ?Goal: Pt Will Go Supine/Side To Sit ?Outcome: Progressing ?Flowsheets (Taken 09/22/2021 1549) ?Pt will go Supine/Side to Sit: ? with modified independence ? with supervision ?Goal: Patient Will Transfer Sit To/From Stand ?Outcome: Progressing ?Flowsheets (Taken 09/22/2021 1549) ?Patient will transfer sit to/from stand: ? with supervision ? with modified independence ?Goal: Pt Will Transfer Bed To Chair/Chair To Bed ?Outcome: Progressing ?Flowsheets (Taken 09/22/2021 1549) ?Pt will Transfer Bed to Chair/Chair to Bed: ? with modified independence ? with supervision ?Goal: Pt Will Ambulate ?Outcome: Progressing ?Flowsheets (Taken 09/22/2021 1549) ?Pt will Ambulate: ? 75 feet ? with supervision ? with rolling walker ? with least restrictive assistive device ?  ?3:50 PM, 09/22/21 ?Lonell Grandchild, MPT ?Physical Therapist with [facility] ?[facility] ?[phone] office ?[phone] mobile phone ? ?

## 2021-09-22 NOTE — TOC Initial Note (Signed)
Transition of Care (TOC) - Initial/Assessment Note  ? ? ?Patient Details  ?Name: Derek Owen ?MRN: [id number] ?Date of Birth: September 22, 1965 ? ?Transition of Care (TOC) CM/SW Contact:    ?Iona Beard, LCSWA ?Phone Number: ?09/22/2021, 2:00 PM ? ?Clinical Narrative:                 ?Pt is high risk for readmission. TOC consulted for SNF placement. At this time there is no PT consult so SNF workup cannot be completed. CSW reached out to MD to see if PT consult could be placed. CSW spoke with pt about possibility of SNF. Pt states that he feels that he does need SNF at D/C. TOC to await PT recommendation.  ? ?Expected Discharge Plan: [place] ?Barriers to Discharge: Continued Medical Work up ? ? ?Patient Goals and CMS Choice ?Patient states their goals for this hospitalization and ongoing recovery are:: Go to SNF ?CMS Medicare.gov Compare Post Acute Care list provided to:: Patient ?Choice offered to / list presented to : Patient ? ?Expected Discharge Plan and Services ?Expected Discharge Plan: [place] ?In-house Referral: Clinical Social Work ?Discharge Planning Services: CM Consult ?Post Acute Care Choice: [place] ?Living arrangements for the past 2 months: Mobile Home ?                ?  ?  ?  ?  ?  ?  ?  ?  ?  ?  ? ?Prior Living Arrangements/Services ?Living arrangements for the past 2 months: Mobile Home ?Lives with:: Self ?Patient language and need for interpreter reviewed:: Yes ?Do you feel safe going back to the place where you live?: Yes      ?Need for Family Participation in Patient Care: Yes (Comment) ?Care giver support system in place?: Yes (comment) ?  ?Criminal Activity/Legal Involvement Pertinent to Current Situation/Hospitalization: No - Comment as needed ? ?Activities of Daily Living ?Home Assistive Devices/Equipment: None ?ADL Screening (condition at time of admission) ?Patient's cognitive ability adequate to safely complete daily activities?: Yes ?Is  the patient deaf or have difficulty hearing?: No ?Does the patient have difficulty seeing, even when wearing glasses/contacts?: No ?Does the patient have difficulty concentrating, remembering, or making decisions?: No ?Patient able to express need for assistance with ADLs?: Yes ?Does the patient have difficulty dressing or bathing?: Yes ?Independently performs ADLs?: No ?Communication: Independent ?Dressing (OT): Independent ?Grooming: Needs assistance ?Is this a change from baseline?: Change from baseline, expected to last >3 days ?Feeding: Independent ?Bathing: Needs assistance ?Is this a change from baseline?: Change from baseline, expected to last >3 days ?Toileting: Needs assistance ?Is this a change from baseline?: Change from baseline, expected to last >3days ?In/Out Bed: Needs assistance ?Is this a change from baseline?: Change from baseline, expected to last >3 days ?Walks in Home: Needs assistance ?Is this a change from baseline?: Change from baseline, expected to last >3 days ?Does the patient have difficulty walking or climbing stairs?: Yes ?Weakness of Legs: None ?Weakness of Arms/Hands: None ? ?Permission Sought/Granted ?  ?  ?   ?   ?   ?   ? ?Emotional Assessment ?Appearance:: Appears stated age ?Attitude/Demeanor/Rapport: Engaged ?Affect (typically observed): Accepting ?Orientation: : Oriented to Self, Oriented to Place, Oriented to  Time, Oriented to Situation ?Alcohol / Substance Use: Not Applicable ?Psych Involvement: No (comment) ? ?Admission diagnosis:  Dyspnea on exertion [R06.09] ?Status post thoracentesis [Z98.890] ?Pleural effusion, bilateral [J90] ?Pleural effusion on left [J90] ?Acute on chronic  respiratory failure with hypoxia ([place]) [J96.21] ?Patient Active Problem List  ? Diagnosis Date Noted  ? Acute on chronic respiratory failure with hypoxia ([place]) 09/21/2021  ? COPD with acute exacerbation ([place]) 09/21/2021  ? Tobacco abuse 09/21/2021  ? Mixed hyperlipidemia 09/21/2021  ? Leg edema  09/21/2021  ? Hypokalemia 09/21/2021  ? Hospital discharge follow-up 08/31/2021  ? Pneumothorax 08/13/2021  ? Metastasis to lung ([place]) 08/13/2021  ? Cancer of kidney parenchyma, left ([place]) 08/09/2021  ? Iron deficiency anemia 07/29/2021  ? Morbid obesity ([place]) 07/28/2021  ? Acute respiratory failure with hypoxia ([place]) 07/19/2021  ? Recurrent pleural effusion 07/19/2021  ? Mass of abdomen 07/19/2021  ? Uncontrolled type 2 diabetes mellitus with hyperglycemia ([place]) 11/17/2020  ? Essential hypertension 11/17/2020  ? RBC microcytosis 10/22/2020  ? High risk medication use 10/13/2020  ? Polyarthritis with positive rheumatoid factor ([place]) 10/12/2020  ? Immunization due 10/08/2020  ? Gout 08/31/2020  ? ?PCP:  Renee Rival, FNP ?Pharmacy:   ?[id number] [place] [place] [address] ?[place] ?[place] [address] ?Phone: [phone] Fax: [phone] ? ?[facility] ?[address] ?[place] [address] ?Phone: [phone] Fax: [phone] ? ?[place] [place] [address] ?[place] [address] ?Phone: [phone] Fax: [phone] ? ? ? ? ?Social Determinants of Health (SDOH) Interventions ?  ? ?Readmission Risk Interventions ?Readmission Risk Prevention Plan 09/22/2021  ?Transportation Screening Complete  ?Medication Review Press photographer) Complete  ?[place] or Home Care Consult Complete  ?SW Recovery Care/Counseling Consult Complete  ?Palliative Care Screening (No Data)  ?Skilled Nursing Facility Complete  ?Some recent data might be hidden  ? ? ? ?

## 2021-09-22 NOTE — Progress Notes (Signed)
Patient notified nursing that he had box of Rybelsus and bottle of Cabometyx from home. Placed meds in sealed home medication bag, patient confirmed seeing meds in sealed bag. Confirmed with charge RN. Delivered to pharmacy. Home medication sheet signed by patient and placed in shadow chart. Assigned RN aware.  ?

## 2021-09-23 DIAGNOSIS — J441 Chronic obstructive pulmonary disease with (acute) exacerbation: Secondary | ICD-10-CM | POA: Diagnosis not present

## 2021-09-23 DIAGNOSIS — I10 Essential (primary) hypertension: Secondary | ICD-10-CM | POA: Diagnosis not present

## 2021-09-23 DIAGNOSIS — E876 Hypokalemia: Secondary | ICD-10-CM | POA: Diagnosis not present

## 2021-09-23 DIAGNOSIS — J9621 Acute and chronic respiratory failure with hypoxia: Secondary | ICD-10-CM | POA: Diagnosis not present

## 2021-09-23 LAB — GLUCOSE, CAPILLARY
Glucose-Capillary: 324 mg/dL — ABNORMAL HIGH (ref 70–99)
Glucose-Capillary: 295 mg/dL — ABNORMAL HIGH (ref 70–99)
Glucose-Capillary: 69 mg/dL — ABNORMAL LOW (ref 70–99)
Glucose-Capillary: 151 mg/dL — ABNORMAL HIGH (ref 70–99)

## 2021-09-23 LAB — CBC WITH DIFFERENTIAL/PLATELET
Abs Immature Granulocytes: 0.02 10*3/uL (ref 0.00–0.07)
Basophils Absolute: 0 10*3/uL (ref 0.0–0.1)
Basophils Relative: 0 %
Eosinophils Absolute: 0 10*3/uL (ref 0.0–0.5)
Eosinophils Relative: 0 %
HCT: 35.3 % — ABNORMAL LOW (ref 39.0–52.0)
Hemoglobin: 10.6 g/dL — ABNORMAL LOW (ref 13.0–17.0)
Immature Granulocytes: 0 %
Lymphocytes Relative: 4 %
Lymphs Abs: 0.3 10*3/uL — ABNORMAL LOW (ref 0.7–4.0)
MCH: 22.1 pg — ABNORMAL LOW (ref 26.0–34.0)
MCHC: 30 g/dL (ref 30.0–36.0)
MCV: 73.7 fL — ABNORMAL LOW (ref 80.0–100.0)
Monocytes Absolute: 0.2 10*3/uL (ref 0.1–1.0)
Monocytes Relative: 3 %
Neutro Abs: 6.9 10*3/uL (ref 1.7–7.7)
Neutrophils Relative %: 93 %
Platelets: 237 10*3/uL (ref 150–400)
RBC: 4.79 MIL/uL (ref 4.22–5.81)
RDW: 17.7 % — ABNORMAL HIGH (ref 11.5–15.5)
WBC: 7.5 10*3/uL (ref 4.0–10.5)
nRBC: 0 % (ref 0.0–0.2)

## 2021-09-23 LAB — COMPREHENSIVE METABOLIC PANEL
ALT: 15 U/L (ref 0–44)
AST: 14 U/L — ABNORMAL LOW (ref 15–41)
Albumin: 2.7 g/dL — ABNORMAL LOW (ref 3.5–5.0)
Alkaline Phosphatase: 90 U/L (ref 38–126)
Anion gap: 11 (ref 5–15)
BUN: 16 mg/dL (ref 6–20)
CO2: 34 mmol/L — ABNORMAL HIGH (ref 22–32)
Calcium: 8.4 mg/dL — ABNORMAL LOW (ref 8.9–10.3)
Chloride: 91 mmol/L — ABNORMAL LOW (ref 98–111)
Creatinine, Ser: 1.04 mg/dL (ref 0.61–1.24)
GFR, Estimated: >60 mL/min (ref >60)
Glucose, Bld: 340 mg/dL — ABNORMAL HIGH (ref 70–99)
Potassium: 3.4 mmol/L — ABNORMAL LOW (ref 3.5–5.1)
Sodium: 136 mmol/L (ref 135–145)
Total Bilirubin: 0.3 mg/dL (ref 0.3–1.2)
Total Protein: 6.7 g/dL (ref 6.5–8.1)

## 2021-09-23 LAB — LACTATE DEHYDROGENASE: LDH: 198 U/L — ABNORMAL HIGH (ref 98–192)

## 2021-09-23 LAB — HEMOGLOBIN A1C
Hgb A1c MFr Bld: 8.7 % — ABNORMAL HIGH (ref 4.8–5.6)
Mean Plasma Glucose: 203 mg/dL

## 2021-09-23 LAB — PATHOLOGIST SMEAR REVIEW

## 2021-09-23 MED ORDER — METHYLPREDNISOLONE SODIUM SUCC 125 MG IJ SOLR
60.0000 mg | INTRAMUSCULAR | Status: DC
Start: 2021-09-24 — End: 2021-09-25
  Administered 2021-09-24: 60 mg via INTRAVENOUS
  Filled 2021-09-23: qty 2

## 2021-09-23 MED ORDER — POTASSIUM CHLORIDE CRYS ER 10 MEQ PO TBCR
40.0000 meq | EXTENDED_RELEASE_TABLET | Freq: Once | ORAL | Status: AC
Start: 2021-09-23 — End: 2021-09-23
  Administered 2021-09-23: 40 meq via ORAL
  Filled 2021-09-23: qty 4

## 2021-09-23 MED ORDER — INSULIN GLARGINE-YFGN 100 UNIT/ML ~~LOC~~ SOLN
15.0000 [IU] | Freq: Every day | SUBCUTANEOUS | Status: DC
  Administered 2021-09-23 – 2021-09-24 (×2): 15 [IU] via SUBCUTANEOUS
  Filled 2021-09-23 (×4): qty 0.15

## 2021-09-23 MED ORDER — FUROSEMIDE 10 MG/ML IJ SOLN
20.0000 mg | Freq: Once | INTRAMUSCULAR | Status: AC
  Administered 2021-09-23: 20 mg via INTRAVENOUS
  Filled 2021-09-23: qty 2

## 2021-09-23 NOTE — Progress Notes (Signed)
Palliative: ?Chart review completed. ? ?Conference with transition of care team related to patient condition, needs, goals of care, disposition. ?DNR/goldenrod form completed and placed on chart. ? ?Plan:    At this point continue to treat the treatable but no CPR or intubation.  Considering stopping cancer treatment.  Short-term rehab with outpatient palliative ? ?No charge ?Quinn Axe, NP ?Palliative medicine team ?Team phone [phone] ?Greater than 50% of this time was spent counseling and coordinating care related to the above assessment and plan. ?

## 2021-09-23 NOTE — Progress Notes (Signed)
?   09/23/21 1300  ?Clinical Encounter Type  ?Visited With Patient  ?Visit Type Follow-up  ?Referral From Nurse  ?Consult/Referral To Chaplain  ?Spiritual Encounters  ?Spiritual Needs Literature  ?Stress Factors  ?Patient Stress Factors Major life changes;Loss  ?Advance Directives (For Healthcare)  ?Does Patient Have a Medical Advance Directive? No  ?Would patient like information on creating a medical advance directive? Yes (Inpatient - patient requests chaplain consult to create a medical advance directive)  ?Mental Health Advance Directives  ?Does Patient Have a Mental Health Advance Directive? No  ?Would patient like information on creating a mental health advance directive? No - Patient declined  ? ? ?Received reerral from NP Palliative care that patient would like assistance with completing an Advanced Care Directive. When I arrived in the room, it was dark and patient was asleep. I woke him gently and introduced myself. He accepted the information and I left the AD packet on his tray table. He stated that he was not up to talking today and asked that I return tomorrow.  ?Chaplain will remain available in order to provide spiritual support and to assess for spiritual need.  ? ?Rev. Bennie Pierini, M.Div ?Chaplain ?[phone] ?

## 2021-09-23 NOTE — TOC Progression Note (Addendum)
Transition of Care (TOC) - Progression Note  ? ? ?Patient Details  ?Name: Derek Owen ?MRN: [id number] ?Date of Birth: 05/07/1966 ? ?Transition of Care (TOC) CM/SW Contact  ?Boneta Lucks, RN ?Phone Number: ?09/23/2021, 11:39 AM ? ?Clinical Narrative:   [facility]. Patient is accepting. Melissa will start INS AUTH. Updated Melissa patient will need out patient palliative. She was aware of the referral. Patient will need to bring his meds to facility.  ? ?Expected Discharge Plan: [place] ?Barriers to Discharge: Continued Medical Work up ? ?Expected Discharge Plan and Services ?Expected Discharge Plan: [place] ?In-house Referral: Clinical Social Work ?Discharge Planning Services: CM Consult ?Post Acute Care Choice: [place] ?Living arrangements for the past 2 months: Mobile Home ?                ?  ? Readmission Risk Interventions ?Readmission Risk Prevention Plan 09/22/2021  ?Transportation Screening Complete  ?Medication Review Press photographer) Complete  ?[place] or Home Care Consult Complete  ?SW Recovery Care/Counseling Consult Complete  ?Palliative Care Screening (No Data)  ?Skilled Nursing Facility Complete  ?Some recent data might be hidden  ? ? ?

## 2021-09-23 NOTE — Progress Notes (Signed)
?PROGRESS NOTE ? ? ? ?Derek Owen  STM:[id number] DOB: 03/30/1966 DOA: 09/21/2021 ?PCP: Renee Rival, FNP  ? ? ?Chief Complaint  ?Patient presents with  ? Shortness of Breath  ? ? 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  Notably, the patient was recently mated to the hospital from 08/26/2021 to 08/27/2021 with similar symptoms.  CTA of the chest was negative for PE at that time.  He underwent left-sided thoracocentesis removing 1.1 L with symptomatic improvement.  In addition, the patient had a hospitalization from 07/18/2021 to 07/30/2021 for recurrent right-sided pleural effusion.  He subsequently underwent VATS and right pleural biopsy on 07/25/2021 performed by Dr. Cyndia Bent.  Pathology was suggestive of renal cell carcinoma.  The patient has since followed up with Dr. Delton Coombes.  The patient states that he has recently started cabometyx immunotherapy about 8 days prior to this admission.  He states that he is normally on 3 L nasal cannula at home but has become more short of breath.  He has also noted orthopnea type symptoms and increasing lower extremity edema.  Unfortunately, the patient continues to smoke.  He has a nonproductive cough.  There is no hemoptysis.  He has not had any fevers, chills, headache, neck pain, chest pain, nausea, vomiting, diarrhea, domino pain, dysuria, hematuria. ?In the ED, the patient was afebrile and hemodynamically stable with oxygen saturation 96-100% on 3 L.  BMP showed a sodium 139, potassium 3.1, bicarbonate 35, serum creatinine 0.95.  AST 25, ALT 21, alk phosphatase 111, total bilirubin 0.3, albumin 3.1.  WBC 6.5, hemoglobin 10.7, platelets 220,000.  BNP 34.  Chest x-ray showed bilateral pleural effusion, left greater than right.  The patient was  admitted for further evaluation and treatment of his acute on chronic respiratory failure.  ? ? ?Assessment & Plan: ? Principal Problem: ?  Acute on chronic respiratory failure with hypoxia (HCC) ?Active Problems: ?  Recurrent pleural effusion ?  COPD with acute exacerbation ([place]) ?  Metastasis to lung [facility]) ?  Polyarthritis with positive rheumatoid factor (HCC) ?  Uncontrolled type 2 diabetes mellitus with hyperglycemia ([place]) ?  Essential hypertension ?  Morbid obesity ([place]) ?  Tobacco abuse ?  Mixed hyperlipidemia ?  Leg edema ?  Hypokalemia ?  Pleural effusion on left ? ? ? ?Assessment and Plan: ?* Acute on chronic respiratory failure with hypoxia (HCC) ?-Multifactorial secondary to recurrent malignant pleural effusions, COPD exacerbation, and deconditioning ?-Status post therapeutic ultrasound-guided thoracentesis 09/21/2021 per IR with 1.6 L of fluid removed.  ?-Gram stain of pleural fluid with no organisms seen.  Cultures pending.  ?-Patient states clinical improvement postthoracentesis and had one done in February 2023. ?-May need a Pleurx drain if continued recurrent malignant pleural effusion. ?-Continue Pulmicort nebs, scheduled DuoNebs, IV Solu-Medrol taper. ?-Primary oncologist notified of admission via epic. ?Limited echo to rule out pericardial effusion/tamponade ?Currently on 3 to 4 L nasal cannula . ?-Patient noted to be on 3 L nasal cannula at baseline. ?With BNP 34--low suspicion of CHF. ?-We will give a dose of Lasix 20 mg IV x1 and monitor urine output. ? ?Recurrent pleural effusion ?Patient is status post VATS and R-pleural bx 07/25/21--Dr. Cyndia Bent ?-Requested for left-sided thoracocentesis 3/14--send fluid for cell count, culture, LDH, protein ?-May need to consider VATS/Pleurx catheter if continued reaccumulation ?-Last  thoracocentesis 08/27/2021 removing 1.1 L ?-Hematologist/oncologist notified of admission via epic. ? ?COPD with acute exacerbation ([place]) ?-Continue scheduled DuoNebs, Pulmicort,  Flonase, Claritin, PPI.  ?-Decrease IV Solu-Medrol to 60 mg daily.  ? ?Metastasis to lung [facility]) ?Patient follows Dr. Delton Coombes ?He has known renal cell carcinoma with metastasis to the lung ?Patient states that he started immunotherapy 8 days prior to this admission. ?-Patient states oncologist told him he had only a few months to live and seem to be at peace with this. ?-Patient states " he will be going home soon" ?-I have added Dr. Delton Coombes to the patient care team and placed in a formal consultation. ?-Consulted palliative care for goals of care. ?-We will likely need outpatient follow-up with palliative care services. ?-Patient's CODE STATUS has been changed to a DNR. ? ?Hypokalemia ?-Potassium at 3.4.   ?-Check a magnesium in the AM.   ?-Repeat labs in the AM. ? ?Leg edema ?Lower extremity Dopplers done 08/27/2021 negative for DVT. ?Albumin level at 3.1. ?-Follow. ? ?Mixed hyperlipidemia ?-Statin.  ? ?Tobacco abuse ?Tobacco cessation discussed ? ?Morbid obesity ([place]) ?BMI 41.09 ?Lifestyle modification. ?Outpatient follow-up with ? ?Essential hypertension ?-Controlled on current regimen of Norvasc, losartan.  ? ?Uncontrolled type 2 diabetes mellitus with hyperglycemia ([place]) ?07/19/2021 hemoglobin A1c 8.0 ?-CBG 324 this morning. ?-Patient on IV steroids ?-Continue to hold oral hypoglycemic agents. ?-Increase Semglee 15 units daily. ?-Discontinue NovoLog meal coverage insulin and continue SSI.   ? ?Polyarthritis with positive rheumatoid factor (HCC) ?Patient takes methotrexate once weekly ?-Follow-up Dr. Benjamine Mola ? ? ? ? ?  ? ? ?DVT prophylaxis: Lovenox ?Code Status: DNR ?Family Communication: Updated patient.  No family at bedside. ?Disposition: Likely SNF with palliative care following ? ?Status is: Inpatient ?Remains inpatient appropriate because: Severity of ?  ?Consultants:  ?Interventional radiology ?Hematology/oncology ?Palliative care ? ?Procedures:  ?Chest x-ray 09/21/2021 ?Ultrasound-guided thoracentesis  left yielding 1.6 L of pleural fluid per IR, Dr. Thornton Papas 09/21/2021 ? ?Antimicrobials:  ?Azithromycin 500 mg p.o. x1 09/21/2021 ?IV Rocephin 3//14 /2023x1 dose ? ? ?Subjective: ?Sleeping but arousable.  No chest pain.  Feels shortness of breath is improving.  No abdominal pain.  Still with some wheezing per patient.  Tolerating current diet.   ? ?Objective: ?Vitals:  ? 09/23/21 0238 09/23/21 0548 09/23/21 0858 09/23/21 1424  ?BP:  (!) 111/92  115/80  ?Pulse:  (!) 110  100  ?Resp:  18  18  ?Temp:  99.2 ?F (37.3 ?C)  98.9 ?F (37.2 ?C)  ?TempSrc:  Oral  Oral  ?SpO2: 91% 93% 94% 94%  ?Weight:      ?Height:      ? ? ?Intake/Output Summary (Last 24 hours) at 09/23/2021 1849 ?Last data filed at 09/23/2021 1839 ?Gross per 24 hour  ?Intake 720 ml  ?Output 2125 ml  ?Net -1405 ml  ? ?Filed Weights  ? 09/21/21 1111 09/21/21 1826  ?Weight: (!) 137.4 kg 134.1 kg  ? ? ?Examination: ? ?General exam: Appears calm and comfortable  ?Respiratory system: Scattered coarse breath sounds.  Minimal to mild expiratory wheezing.  Decreased breath sounds in the left base.  Speaking in full sentences.  ?Cardiovascular system: S1 & S2 heard, RRR. No JVD, murmurs, rubs, gallops or clicks.  1-2+ bilateral lower extremity edema.  ?Gastrointestinal system: Abdomen is nondistended, soft and nontender. No organomegaly or masses felt. Normal bowel sounds heard. ?Central nervous system: Alert and oriented. No focal neurological deficits. ?Extremities: Symmetric 5 x 5 power. ?Skin: No rashes, lesions or ulcers ?Psychiatry:  Judgement and insight appear normal. Mood & affect appropriate.  ? ? ? ?Data Reviewed:  ? ?CBC: ?Recent Labs  ?Lab 09/21/21 ?1137 09/22/21 ?8864 09/23/21 ?8472  ?WBC 6.5 6.7 7.5  ?NEUTROABS 5.3  --  6.9  ?HGB 10.7* 10.5* 10.6*  ?HCT 36.6* 37.5* 35.3*  ?MCV 75.8* 74.1* 73.7*  ?PLT 220 218 237  ? ? ?Basic Metabolic Panel: ?Recent Labs  ?Lab 09/21/21 ?1137 09/22/21 ?0721 09/23/21 ?8288  ?NA 139 137 136  ?K 3.1* 3.5 3.4*  ?CL 92* 92* 91*  ?CO2  35* 36* 34*  ?GLUCOSE 211* 324* 340*  ?BUN 10 11 16   ?CREATININE 0.95 0.91 1.04  ?CALCIUM 8.5* 8.3* 8.4*  ? ? ?GFR: ?Estimated Creatinine Clearance: 111.1 mL/min (by C-G formula based on SCr of 1.04 mg/dL). ?

## 2021-09-24 DIAGNOSIS — J9621 Acute and chronic respiratory failure with hypoxia: Secondary | ICD-10-CM | POA: Diagnosis not present

## 2021-09-24 DIAGNOSIS — J441 Chronic obstructive pulmonary disease with (acute) exacerbation: Secondary | ICD-10-CM | POA: Diagnosis not present

## 2021-09-24 DIAGNOSIS — I10 Essential (primary) hypertension: Secondary | ICD-10-CM | POA: Diagnosis not present

## 2021-09-24 DIAGNOSIS — E876 Hypokalemia: Secondary | ICD-10-CM | POA: Diagnosis not present

## 2021-09-24 DIAGNOSIS — R600 Localized edema: Secondary | ICD-10-CM

## 2021-09-24 LAB — BASIC METABOLIC PANEL
Anion gap: 10 (ref 5–15)
BUN: 18 mg/dL (ref 6–20)
CO2: 33 mmol/L — ABNORMAL HIGH (ref 22–32)
Calcium: 8.1 mg/dL — ABNORMAL LOW (ref 8.9–10.3)
Chloride: 95 mmol/L — ABNORMAL LOW (ref 98–111)
Creatinine, Ser: 0.98 mg/dL (ref 0.61–1.24)
GFR, Estimated: >60 mL/min (ref >60)
Glucose, Bld: 330 mg/dL — ABNORMAL HIGH (ref 70–99)
Potassium: 3.5 mmol/L (ref 3.5–5.1)
Sodium: 138 mmol/L (ref 135–145)

## 2021-09-24 LAB — CBC WITH DIFFERENTIAL/PLATELET
Abs Immature Granulocytes: 0.03 10*3/uL (ref 0.00–0.07)
Basophils Absolute: 0 10*3/uL (ref 0.0–0.1)
Basophils Relative: 0 %
Eosinophils Absolute: 0 10*3/uL (ref 0.0–0.5)
Eosinophils Relative: 0 %
HCT: 32.7 % — ABNORMAL LOW (ref 39.0–52.0)
Hemoglobin: 9.8 g/dL — ABNORMAL LOW (ref 13.0–17.0)
Immature Granulocytes: 1 %
Lymphocytes Relative: 6 %
Lymphs Abs: 0.4 10*3/uL — ABNORMAL LOW (ref 0.7–4.0)
MCH: 22.1 pg — ABNORMAL LOW (ref 26.0–34.0)
MCHC: 30 g/dL (ref 30.0–36.0)
MCV: 73.6 fL — ABNORMAL LOW (ref 80.0–100.0)
Monocytes Absolute: 0.4 10*3/uL (ref 0.1–1.0)
Monocytes Relative: 6 %
Neutro Abs: 5.4 10*3/uL (ref 1.7–7.7)
Neutrophils Relative %: 87 %
Platelets: 228 10*3/uL (ref 150–400)
RBC: 4.44 MIL/uL (ref 4.22–5.81)
RDW: 17.8 % — ABNORMAL HIGH (ref 11.5–15.5)
WBC: 6.2 10*3/uL (ref 4.0–10.5)
nRBC: 0 % (ref 0.0–0.2)

## 2021-09-24 LAB — MAGNESIUM: Magnesium: 1.5 mg/dL — ABNORMAL LOW (ref 1.7–2.4)

## 2021-09-24 LAB — GLUCOSE, CAPILLARY
Glucose-Capillary: 304 mg/dL — ABNORMAL HIGH (ref 70–99)
Glucose-Capillary: 274 mg/dL — ABNORMAL HIGH (ref 70–99)
Glucose-Capillary: 171 mg/dL — ABNORMAL HIGH (ref 70–99)
Glucose-Capillary: 200 mg/dL — ABNORMAL HIGH (ref 70–99)

## 2021-09-24 MED ORDER — FUROSEMIDE 10 MG/ML IJ SOLN
40.0000 mg | Freq: Every day | INTRAMUSCULAR | Status: DC
  Administered 2021-09-24: 40 mg via INTRAVENOUS
  Filled 2021-09-24: qty 4

## 2021-09-24 MED ORDER — POTASSIUM CHLORIDE CRYS ER 20 MEQ PO TBCR
40.0000 meq | EXTENDED_RELEASE_TABLET | Freq: Once | ORAL | Status: AC
Start: 2021-09-24 — End: 2021-09-24
  Administered 2021-09-24: 40 meq via ORAL
  Filled 2021-09-24: qty 2

## 2021-09-24 MED ORDER — SORBITOL 70 % SOLN
30.0000 mL | Status: AC
  Administered 2021-09-24: 30 mL via ORAL
  Filled 2021-09-24 (×2): qty 30

## 2021-09-24 MED ORDER — MAGNESIUM SULFATE 4 GM/100ML IV SOLN
4.0000 g | Freq: Once | INTRAVENOUS | Status: AC
  Administered 2021-09-24: 4 g via INTRAVENOUS
  Filled 2021-09-24: qty 100

## 2021-09-24 NOTE — Progress Notes (Signed)
?PROGRESS NOTE ? ? ? ?Derek Owen  EPP:[id number] DOB: 17-Jun-1966 DOA: 09/21/2021 ?PCP: Renee Rival, FNP  ? ? ?Chief Complaint  ?Patient presents with  ? Shortness of Breath  ? ? 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  Notably, the patient was recently mated to the hospital from 08/26/2021 to 08/27/2021 with similar symptoms.  CTA of the chest was negative for PE at that time.  He underwent left-sided thoracocentesis removing 1.1 L with symptomatic improvement.  In addition, the patient had a hospitalization from 07/18/2021 to 07/30/2021 for recurrent right-sided pleural effusion.  He subsequently underwent VATS and right pleural biopsy on 07/25/2021 performed by Dr. Cyndia Bent.  Pathology was suggestive of renal cell carcinoma.  The patient has since followed up with Dr. Delton Coombes.  The patient states that he has recently started cabometyx immunotherapy about 8 days prior to this admission.  He states that he is normally on 3 L nasal cannula at home but has become more short of breath.  He has also noted orthopnea type symptoms and increasing lower extremity edema.  Unfortunately, the patient continues to smoke.  He has a nonproductive cough.  There is no hemoptysis.  He has not had any fevers, chills, headache, neck pain, chest pain, nausea, vomiting, diarrhea, domino pain, dysuria, hematuria. ?In the ED, the patient was afebrile and hemodynamically stable with oxygen saturation 96-100% on 3 L.  BMP showed a sodium 139, potassium 3.1, bicarbonate 35, serum creatinine 0.95.  AST 25, ALT 21, alk phosphatase 111, total bilirubin 0.3, albumin 3.1.  WBC 6.5, hemoglobin 10.7, platelets 220,000.  BNP 34.  Chest x-ray showed bilateral pleural effusion, left greater than right.  The patient was  admitted for further evaluation and treatment of his acute on chronic respiratory failure.  ? ? ?Assessment & Plan: ? Principal Problem: ?  Acute on chronic respiratory failure with hypoxia (HCC) ?Active Problems: ?  Recurrent pleural effusion ?  COPD with acute exacerbation ([place]) ?  Metastasis to lung [facility]) ?  Polyarthritis with positive rheumatoid factor (HCC) ?  Uncontrolled type 2 diabetes mellitus with hyperglycemia ([place]) ?  Essential hypertension ?  Morbid obesity ([place]) ?  Tobacco abuse ?  Mixed hyperlipidemia ?  Leg edema ?  Hypokalemia ?  Pleural effusion on left ?  Hypomagnesemia ? ? ? ?Assessment and Plan: ?* Acute on chronic respiratory failure with hypoxia (HCC) ?-Multifactorial secondary to recurrent malignant pleural effusions, COPD exacerbation, and deconditioning and probable volume overload. ?-Status post therapeutic ultrasound-guided thoracentesis 09/21/2021 per IR with 1.6 L of fluid removed.  ?-Gram stain of pleural fluid with no organisms seen.  Cultures pending with no growth to date.  AFB negative..  ?-Patient states clinical improvement postthoracentesis and had one done in February 2023. ?-Patient received dose of Lasix yesterday with a urine output of 1.625 L over the past 24 hours with some clinical improvement. ?-May need a Pleurx drain if continued recurrent malignant pleural effusion. ?-Continue Pulmicort nebs, scheduled DuoNebs, IV Solu-Medrol taper. ?-Primary oncologist notified of admission via epic. ?Limited echo to rule out pericardial effusion/tamponade ?Currently on 3 L nasal cannula . ?-Patient noted to be on 3 L nasal cannula at baseline. ?With BNP 34--low suspicion of CHF. ?-As patient with good response to IV Lasix will place on Lasix 40 mg IV daily  and monitor urine output.  ? ?Recurrent pleural effusion ?Patient is status post VATS and R-pleural bx 07/25/21--Dr. Cyndia Bent ?-Requested for left-sided thoracocentesis 3/14--send fluid for cell count, culture, LDH, protein ?-May need  to consider VATS/Pleurx catheter if continued reaccumulation ?-Last thoracocentesis 08/27/2021 removing 1.1 L ?-Hematologist/oncologist notified of admission via epic. ? ?COPD with acute exacerbation ([place]) ?-Continue scheduled DuoNebs, Pulmicort, Flonase, Claritin, PPI.  ?-Continue IV Solu-Medrol 60 mg daily and could likely transition to oral prednisone taper in the next 1 to 2 days. ? ?Metastasis to lung [facility]) ?Patient follows Dr. Delton Coombes ?He has known renal cell carcinoma with metastasis to the lung ?Patient states that he started immunotherapy 8 days prior to this admission. ?-Patient states oncologist told him he had only a few months to live and seem to be at peace with this. ?-Patient states " he will be going home soon" ?-I have added Dr. Delton Coombes to the patient care team and placed in a formal consultation. ?-Consulted palliative care for goals of care. ?-We will likely need outpatient follow-up with palliative care services. ?-Patient's CODE STATUS has been changed to a DNR. ? ?Hypomagnesemia ?- Magnesium at 1.5. ?-Magnesium sulfate 4 g IV x1. ?-Repeat labs in the AM. ? ?Hypokalemia ?-Potassium at 3.5.   ?-Magnesium at 1.5.   ?-Magnesium 4 g IV x1.   ?-K-Dur 40 mEq p.o. x1 as patient been placed on IV Lasix.  -Repeat labs in the a.m.  ? ?Leg edema ?Lower extremity Dopplers done 08/27/2021 negative for DVT. ?Albumin level at 3.1. ?-Place on Lasix 40 mg IV daily. ?-Follow. ? ?Mixed hyperlipidemia ?-Statin.  ? ?Tobacco abuse ?Tobacco cessation discussed ? ?Morbid obesity ([place]) ?BMI 41.09 ?Lifestyle modification. ?Outpatient follow-up with ? ?Essential hypertension ?-Controlled on current regimen of Norvasc, losartan. ?-Patient on IV Lasix daily.  ? ?Uncontrolled type 2 diabetes mellitus with hyperglycemia ([place]) ?07/19/2021 hemoglobin A1c 8.0 ?-CBG 304 this morning. ?-Patient on IV steroids taper per ?-Continue to hold oral hypoglycemic agents. ?-Continue Semglee 15 units daily. ?-Discontinue NovoLog meal  coverage insulin and continue SSI.   ? ?Polyarthritis with positive rheumatoid factor (HCC) ?Patient takes methotrexate once weekly ?-Follow-up Dr. Benjamine Mola ? ? ? ? ?  ? ? ?DVT prophylaxis: Lovenox ?Code Status: DNR ?Family Communication: Updated patient.  No family at bedside. ?Disposition: Likely SNF with palliative care following ? ?Status is: Inpatient ?Remains inpatient appropriate because: Severity of ?  ?Consultants:  ?Interventional radiology ?Hematology/oncology ?Palliative care ? ?Procedures:  ?Chest x-ray 09/21/2021 ?Ultrasound-guided thoracentesis left yielding 1.6 L of pleural fluid per IR, Dr. Thornton Papas 09/21/2021 ? ?Antimicrobials:  ?Azithromycin 500 mg p.o. x1 09/21/2021 ?IV Rocephin 3//14 /2023x1 dose ? ? ?Subjective: ?Patient sitting up in chair.  Feels some improvement with shortness of breath.  States had significant urine output after Lasix yesterday.  Wheezing improving.   ? ?Objective: ?Vitals:  ? 09/24/21 0505 09/24/21 0824 09/24/21 1308 09/24/21 1413  ?BP: 130/70  (!) 141/88   ?Pulse: (!) 101  97   ?Resp: 19  (!) 22   ?Temp: 98.2 ?F (36.8 ?C)  98 ?F (36.7 ?C)   ?TempSrc: Oral  Oral   ?SpO2: 98% 97% 98% 96%  ?Weight:      ?Height:      ? ? ?Intake/Output Summary (Last 24 hours) at 09/24/2021 1857 ?Last data filed at 09/24/2021 1800 ?Gross per 24 hour  ?Intake 480 ml  ?Output 1000 ml  ?Net -520 ml  ? ?Filed Weights  ? 09/21/21 1111 09/21/21 1826  ?Weight: (!) 137.4 kg 134.1  kg  ? ? ?Examination: ? ?General exam: NAD. ?Respiratory system: Decreased cardiac coarse breath sounds.  Minimal expiratory wheezing.  Decreased breath sounds in the left base.  Speaking in full sentences.  ?Cardiovascular system: RRR no murmurs rubs or gallops.  2+ bilateral lower extremity edema.  ?Gastrointestinal system: Abdomen is nondistended, soft and nontender. No organomegaly or masses felt. Normal bowel sounds heard. ?Central nervous system: Alert and oriented. No focal neurological deficits. ?Extremities: Symmetric 5 x 5  power. ?Skin: No rashes, lesions or ulcers ?Psychiatry: Judgement and insight appear normal. Mood & affect appropriate.  ? ? ? ?Data Reviewed:  ? ?CBC: ?Recent Labs  ?Lab 09/21/21 ?1137 09/22/21 ?9476 03/16/2

## 2021-09-24 NOTE — Progress Notes (Signed)
Physical Therapy Treatment ?Patient Details ?Name: Derek Owen ?MRN: [id number] ?DOB: Feb 10, 1966 ?Today's Date: 09/24/2021 ? ? 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  Notably, the patient was recently mated to the hospital from 08/26/2021 to 08/27/2021 with similar symptoms.  CTA of the chest was negative for PE at that time.  He underwent left-sided thoracocentesis removing 1.1 L with symptomatic improvement.  In addition, the patient had a hospitalization from 07/18/2021 to 07/30/2021 for recurrent right-sided pleural effusion.  He subsequently underwent VATS and right pleural biopsy on 07/25/2021 performed by Dr. Cyndia Bent.  Pathology was suggestive of renal cell carcinoma.  The patient has since followed up with Dr. Delton Coombes.  The patient states that he has recently started cabometyx immunotherapy about 8 days prior to this admission.  He states that he is normally on 3 L nasal cannula at home but has become more short of breath.  He has also noted orthopnea type symptoms and increasing lower extremity edema.  Unfortunately, the patient continues to smoke.  He has a nonproductive cough.  There is no hemoptysis.  He has not had any fevers, chills, headache, neck pain, chest pain, nausea, vomiting, diarrhea, domino pain, dysuria, hematuria. ? ?  ?PT Comments  ? ? Patient demonstrates slightly increased endurance/distance for gait training while on 3 LPM with SpO2 dropping from 98% to 80%, had to increase to 4 LPM, required frequent standing rest breaks and limited mostly due to SOB and generalized weakness.  Patient tolerated sitting up in chair after therapy.  Patient will benefit from continued skilled physical therapy in hospital and recommended venue below to  increase strength, balance, endurance for safe ADLs and gait.  ?  ?Recommendations for follow up therapy are one component of a multi-disciplinary discharge planning process, led by the attending physician.  Recommendations may be updated based on patient status, additional functional criteria and insurance authorization. ? ?Follow Up Recommendations ? Skilled nursing-short term rehab (<3 hours/day) ?  ?  ?Assistance Recommended at Discharge Set up Supervision/Assistance  ?Patient can return home with the following A little help with walking and/or transfers;A little help with bathing/dressing/bathroom;Help with stairs or ramp for entrance;Assistance with cooking/housework ?  ?Equipment Recommendations ? Rolling walker (2 wheels)  ?  ?Recommendations for Other Services   ? ? ?  ?Precautions / Restrictions Precautions ?Precautions: Fall ?Restrictions ?Weight Bearing Restrictions: No  ?  ? ?Mobility ? Bed Mobility ?Overal bed mobility: Needs Assistance ?Bed Mobility: Supine to Sit ?  ?  ?Supine to sit: Supervision ?  ?  ?  ?  ? ?Transfers ?Overall transfer level: Needs assistance ?Equipment used: Rolling walker (2 wheels) ?Transfers: Sit to/from Stand, Bed to chair/wheelchair/BSC ?Sit to Stand: Min guard ?  ?Step pivot transfers: Min guard ?  ?  ?  ?General transfer comment: slightly labored movement ?  ? ?Ambulation/Gait ?Ambulation/Gait assistance: Min assist, Min guard ?Gait Distance (Feet): 65 Feet ?Assistive device: Rolling walker (2 wheels) ?Gait Pattern/deviations: Decreased step length - right, Decreased step length - left, Decreased stride length ?Gait velocity: decreased ?  ?  ?General Gait Details: slightly increased endurance/distance for gait training while on 3 LPM with SpO2 dropping from 98% to 80%, had to increase to 4 LPM, required frequent standing rest breaks due to SOB and  generalized weakness ? ? ?Stairs ?  ?  ?  ?  ?  ? ? ?Wheelchair Mobility ?  ? ?Modified Rankin (Stroke Patients Only) ?  ? ? ?   ?Balance Overall balance assessment: Needs assistance ?Sitting-balance support: Feet supported, No upper extremity supported ?Sitting balance-Leahy Scale: Good ?Sitting balance - Comments: seated at EOB ?  ?Standing balance support: During functional activity, Bilateral upper extremity supported ?Standing balance-Leahy Scale: Fair ?Standing balance comment: fair/good using RW ?  ?  ?  ?  ?  ?  ?  ?  ?  ?  ?  ?  ? ?  ?Cognition Arousal/Alertness: Awake/alert ?Behavior During Therapy: [facility] for tasks assessed/performed ?Overall Cognitive Status: Within Functional Limits for tasks assessed ?  ?  ?  ?  ?  ?  ?  ?  ?  ?  ?  ?  ?  ?  ?  ?  ?  ?  ?  ? ?  ?Exercises General Exercises - Lower Extremity ?Long Arc Quad: Seated, AROM, Strengthening, Both, 10 reps ?Toe Raises: Seated, AROM, Strengthening, Both, 15 reps ?Heel Raises: Seated, AROM, Strengthening, Both, 15 reps ? ?  ?General Comments   ?  ?  ? ?Pertinent Vitals/Pain Pain Assessment ?Pain Assessment: No/denies pain  ? ? ?Home Living   ?  ?  ?  ?  ?  ?  ?  ?  ?  ?   ?  ?Prior Function    ?  ?  ?   ? ?PT Goals (current goals can now be found in the care plan section) Acute Rehab PT Goals ?Patient Stated Goal: return home after rehab ?PT Goal Formulation: With patient ?Time For Goal Achievement: 10/06/21 ?Potential to Achieve Goals: Good ?Progress towards PT goals: Progressing toward goals ? ?  ?Frequency ? ? ? Min 3X/week ? ? ? ?  ?PT Plan Current plan remains appropriate  ? ? ?Co-evaluation   ?  ?  ?  ?  ? ?  ?AM-PAC PT "6 Clicks" Mobility   ?Outcome Measure ? Help needed turning from your back to your side while in a flat bed without using bedrails?: None ?Help needed moving from lying on your back to sitting on the side of a flat bed without using bedrails?: None ?Help needed moving to and from a bed to a chair (including a wheelchair)?: A Little ?Help needed standing up from a chair using your arms (e.g., wheelchair or bedside chair)?: A Little ?Help needed to walk  in hospital room?: A Little ?Help needed climbing 3-5 steps with a railing? : A Lot ?6 Click Score: 19 ? ?  ?End of Session Equipment Utilized During Treatment: Oxygen ?Activity Tolerance: Patient tolerated treatment well;Patient limited by fatigue ?Patient left: with call bell/phone within reach;in chair ?Nurse Communication: Mobility status ?PT Visit Diagnosis: Unsteadiness on feet (R26.81);Other abnormalities of gait and mobility (R26.89);Muscle weakness (generalized) (M62.81) ?  ? ? ?Time: 6010-9323 ?PT Time Calculation (min) (ACUTE ONLY): 20 min ? ?Charges:  $Gait Training: 8-22 mins ?$Therapeutic Exercise: 8-22 mins          ?          ? ?2:11 PM, 09/24/21 ?Lonell Grandchild, MPT ?Physical Therapist with  ?[facility] ?[phone] office ?[phone] mobile phone ? ? ?

## 2021-09-24 NOTE — Progress Notes (Signed)
Inpatient Diabetes Program Recommendations ? ?AACE/ADA: New Consensus Statement on Inpatient Glycemic Control (2015) ? ?Target Ranges:  Prepandial:   less than 140 mg/dL ?     Peak postprandial:   less than 180 mg/dL (1-2 hours) ?     Critically ill patients:  140 - 180 mg/dL  ? ? Latest Reference Range & Units 09/24/21 07:33 09/24/21 11:45  ?Glucose-Capillary 70 - 99 mg/dL 304 (H) ? ?11 units Novolog ? ?15 units Semglee ? 274 (H) ? ?8 units Novolog ?  ?(H): Data is abnormally high ? ? ? ? ? ?Home DM Meds: Glipizide 10 mg daily ?       Rybelsus 14 mg daily ? ? ?Current Orders: Semglee 15 units Daily ?     Novolog Moderate Correction Scale/ SSI (0-15 units) TID AC + HS ?         Rybelsus 14 mg daily ? ? ? ? ?MD- Note Solumedrol reduced to 60 mg daily today (was 120 mg daily) ? ?CBGs remain elevated today ? ?If CBGs remain >200, may consider: ? ?1. Increase Semglee slightly to 18 units daily ? ?2. Start Novolog Meal Coverage: Novolog 4 units TID with meals ?HOLD if pt eats <50% meals ? ? ? ? ?--Will follow patient during hospitalization-- ? ?Wyn Quaker RN, MSN, CDE ?Diabetes Coordinator ?Inpatient Glycemic Control Team ?Team Pager: [phone] (8a-5p) ? ? ? ?

## 2021-09-24 NOTE — Assessment & Plan Note (Addendum)
-  Repleted.   ?-Magnesium at 1.9 today ?-Continue to Monitor and Replete as Necessary ?-Repeat Mag within 1 week ?

## 2021-09-24 NOTE — TOC Progression Note (Signed)
Transition of Care (TOC) - Progression Note  ? ? ?Patient Details  ?Name: Derek Owen ?MRN: [id number] ?Date of Birth: Feb 21, 1966 ? ?Transition of Care (TOC) CM/SW Contact  ?Boneta Lucks, RN ?Phone Number: ?09/24/2021, 11:14 AM ? ?Clinical Narrative:   Updated Melissa at [place]. Patient will be medically ready Sun or Monday. She has started [employer] fast streamer. MD updated with Medicaid it takes longer.  ? ? ?Expected Discharge Plan: [place] ?Barriers to Discharge: Ship broker, Continued Medical Work up ? ?Expected Discharge Plan and Services ?Expected Discharge Plan: [place] ?In-house Referral: Clinical Social Work ?Discharge Planning Services: CM Consult ?Post Acute Care Choice: [place] ?Living arrangements for the past 2 months: Mobile Home ?                ?    ?Readmission Risk Interventions ?Readmission Risk Prevention Plan 09/22/2021  ?Transportation Screening Complete  ?Medication Review Press photographer) Complete  ?[place] or Home Care Consult Complete  ?SW Recovery Care/Counseling Consult Complete  ?Palliative Care Screening (No Data)  ?Skilled Nursing Facility Complete  ?Some recent data might be hidden  ? ? ?

## 2021-09-24 NOTE — Progress Notes (Signed)
Patient's constipation relieved after one dose sorbitol. Declined second dose.  ?

## 2021-09-25 DIAGNOSIS — J441 Chronic obstructive pulmonary disease with (acute) exacerbation: Secondary | ICD-10-CM | POA: Diagnosis not present

## 2021-09-25 DIAGNOSIS — I10 Essential (primary) hypertension: Secondary | ICD-10-CM | POA: Diagnosis not present

## 2021-09-25 DIAGNOSIS — E876 Hypokalemia: Secondary | ICD-10-CM | POA: Diagnosis not present

## 2021-09-25 DIAGNOSIS — K5900 Constipation, unspecified: Secondary | ICD-10-CM | POA: Diagnosis present

## 2021-09-25 DIAGNOSIS — J9621 Acute and chronic respiratory failure with hypoxia: Secondary | ICD-10-CM | POA: Diagnosis not present

## 2021-09-25 LAB — CBC
HCT: 33.9 % — ABNORMAL LOW (ref 39.0–52.0)
Hemoglobin: 10.2 g/dL — ABNORMAL LOW (ref 13.0–17.0)
MCH: 22 pg — ABNORMAL LOW (ref 26.0–34.0)
MCHC: 30.1 g/dL (ref 30.0–36.0)
MCV: 73.2 fL — ABNORMAL LOW (ref 80.0–100.0)
Platelets: 207 10*3/uL (ref 150–400)
RBC: 4.63 MIL/uL (ref 4.22–5.81)
RDW: 18.4 % — ABNORMAL HIGH (ref 11.5–15.5)
WBC: 6.2 10*3/uL (ref 4.0–10.5)
nRBC: 0 % (ref 0.0–0.2)

## 2021-09-25 LAB — BASIC METABOLIC PANEL
Anion gap: 13 (ref 5–15)
BUN: 18 mg/dL (ref 6–20)
CO2: 34 mmol/L — ABNORMAL HIGH (ref 22–32)
Calcium: 8.4 mg/dL — ABNORMAL LOW (ref 8.9–10.3)
Chloride: 93 mmol/L — ABNORMAL LOW (ref 98–111)
Creatinine, Ser: 0.85 mg/dL (ref 0.61–1.24)
GFR, Estimated: >60 mL/min (ref >60)
Glucose, Bld: 257 mg/dL — ABNORMAL HIGH (ref 70–99)
Potassium: 3.4 mmol/L — ABNORMAL LOW (ref 3.5–5.1)
Sodium: 140 mmol/L (ref 135–145)

## 2021-09-25 LAB — GLUCOSE, CAPILLARY
Glucose-Capillary: 211 mg/dL — ABNORMAL HIGH (ref 70–99)
Glucose-Capillary: 233 mg/dL — ABNORMAL HIGH (ref 70–99)
Glucose-Capillary: 208 mg/dL — ABNORMAL HIGH (ref 70–99)
Glucose-Capillary: 116 mg/dL — ABNORMAL HIGH (ref 70–99)
Glucose-Capillary: 178 mg/dL — ABNORMAL HIGH (ref 70–99)

## 2021-09-25 LAB — MAGNESIUM: Magnesium: 1.7 mg/dL (ref 1.7–2.4)

## 2021-09-25 MED ORDER — INSULIN GLARGINE-YFGN 100 UNIT/ML ~~LOC~~ SOLN
18.0000 [IU] | Freq: Every day | SUBCUTANEOUS | Status: DC
  Administered 2021-09-25 – 2021-09-29 (×5): 18 [IU] via SUBCUTANEOUS
  Filled 2021-09-25 (×6): qty 0.18

## 2021-09-25 MED ORDER — MAGNESIUM OXIDE -MG SUPPLEMENT 400 (240 MG) MG PO TABS
400.0000 mg | ORAL_TABLET | Freq: Two times a day (BID) | ORAL | Status: DC
  Administered 2021-09-26 – 2021-09-29 (×8): 400 mg via ORAL
  Filled 2021-09-25 (×8): qty 1

## 2021-09-25 MED ORDER — MAGNESIUM SULFATE 4 GM/100ML IV SOLN
4.0000 g | Freq: Once | INTRAVENOUS | Status: AC
  Administered 2021-09-25: 4 g via INTRAVENOUS
  Filled 2021-09-25: qty 100

## 2021-09-25 MED ORDER — FUROSEMIDE 10 MG/ML IJ SOLN
40.0000 mg | Freq: Two times a day (BID) | INTRAMUSCULAR | Status: DC
  Administered 2021-09-25 – 2021-09-28 (×7): 40 mg via INTRAVENOUS
  Filled 2021-09-25 (×7): qty 4

## 2021-09-25 MED ORDER — PREDNISONE 20 MG PO TABS
60.0000 mg | ORAL_TABLET | Freq: Every day | ORAL | Status: AC
  Administered 2021-09-25 – 2021-09-27 (×3): 60 mg via ORAL
  Filled 2021-09-25 (×3): qty 3

## 2021-09-25 MED ORDER — SENNOSIDES-DOCUSATE SODIUM 8.6-50 MG PO TABS
1.0000 | ORAL_TABLET | Freq: Every day | ORAL | Status: DC
  Administered 2021-09-26 – 2021-09-29 (×3): 1 via ORAL
  Filled 2021-09-25 (×5): qty 1

## 2021-09-25 MED ORDER — POTASSIUM CHLORIDE CRYS ER 10 MEQ PO TBCR
40.0000 meq | EXTENDED_RELEASE_TABLET | Freq: Every day | ORAL | Status: DC
  Administered 2021-09-25 – 2021-09-26 (×2): 40 meq via ORAL
  Filled 2021-09-25 (×2): qty 4

## 2021-09-25 NOTE — Progress Notes (Addendum)
?PROGRESS NOTE ? ? ? ?Derek Owen  STM:[id number] DOB: [birth year] DOA: 09/21/2021 ?PCP: Renee Rival, FNP  ? ? ?Chief Complaint  ?Patient presents with  ? Shortness of Breath  ? ? 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  Notably, the patient was recently mated to the hospital from 08/26/2021 to 08/27/2021 with similar symptoms.  CTA of the chest was negative for PE at that time.  He underwent left-sided thoracocentesis removing 1.1 L with symptomatic improvement.  In addition, the patient had a hospitalization from 07/18/2021 to 07/30/2021 for recurrent right-sided pleural effusion.  He subsequently underwent VATS and right pleural biopsy on 07/25/2021 performed by Dr. Cyndia Bent.  Pathology was suggestive of renal cell carcinoma.  The patient has since followed up with Dr. Delton Coombes.  The patient states that he has recently started cabometyx immunotherapy about 8 days prior to this admission.  He states that he is normally on 3 L nasal cannula at home but has become more short of breath.  He has also noted orthopnea type symptoms and increasing lower extremity edema.  Unfortunately, the patient continues to smoke.  He has a nonproductive cough.  There is no hemoptysis.  He has not had any fevers, chills, headache, neck pain, chest pain, nausea, vomiting, diarrhea, domino pain, dysuria, hematuria. ?In the ED, the patient was afebrile and hemodynamically stable with oxygen saturation 96-100% on 3 L.  BMP showed a sodium 139, potassium 3.1, bicarbonate 35, serum creatinine 0.95.  AST 25, ALT 21, alk phosphatase 111, total bilirubin 0.3, albumin 3.1.  WBC 6.5, hemoglobin 10.7, platelets 220,000.  BNP 34.  Chest x-ray showed bilateral pleural effusion, left greater than right.  The patient was  admitted for further evaluation and treatment of his acute on chronic respiratory failure.  ? ? ?Assessment & Plan: ? Principal Problem: ?  Acute on chronic respiratory failure with hypoxia (HCC) ?Active Problems: ?  Recurrent pleural effusion ?  COPD with acute exacerbation ([place]) ?  Metastasis to lung [facility]) ?  Polyarthritis with positive rheumatoid factor (HCC) ?  Uncontrolled type 2 diabetes mellitus with hyperglycemia ([place]) ?  Essential hypertension ?  Morbid obesity ([place]) ?  Tobacco abuse ?  Mixed hyperlipidemia ?  Leg edema ?  Hypokalemia ?  Pleural effusion on left ?  Hypomagnesemia ?  Constipation ? ? ? ?Assessment and Plan: ?* Acute on chronic respiratory failure with hypoxia (HCC) ?-Multifactorial secondary to recurrent malignant pleural effusions, COPD exacerbation, and deconditioning and volume overload. ?-Status post therapeutic ultrasound-guided thoracentesis 09/21/2021 per IR with 1.6 L of fluid removed.  ?-Gram stain of pleural fluid with no organisms seen.  Cultures pending with no growth to date.  AFB negative..  ?-Patient states clinical improvement postthoracentesis and had one done in February 2023. ?-Patient received dose of Lasix over the past 2 days with good urine output.  Urine output recorded was 1.6 L however not sure of accuracy.   ?-Patient with some clinical improvement.  ?-May need a Pleurx drain if continued recurrent malignant pleural effusion. ?-Continue Pulmicort nebs, scheduled DuoNebs, IV Solu-Medrol taper. ?-Primary oncologist notified of admission via epic. ?Limited echo to rule out pericardial effusion/tamponade ?Currently on 3 L nasal cannula . ?-Patient noted to be on 3 L nasal cannula at baseline. ?With BNP 34--?? low suspicion of CHF. ?-As  patient with good response to IV Lasix will place on Lasix 40 mg IV twice daily and monitor urine output and daily weights.  ? ?Recurrent pleural effusion ?Patient is status post VATS and R-pleural bx 07/25/21--Dr. Cyndia Bent ?-Requested for  left-sided thoracocentesis 3/14--send fluid for cell count, culture, LDH, protein ?-May need to consider VATS/Pleurx catheter if continued reaccumulation ?-Last thoracocentesis 08/27/2021 removing 1.1 L ?-Hematologist/oncologist notified of admission via epic. ? ?COPD with acute exacerbation ([place]) ?-Continue scheduled DuoNebs, Pulmicort, Flonase, Claritin, PPI.  ?-Transition from IV Solu-Medrol to oral prednisone taper. ?-Supportive care. ? ?Metastasis to lung [facility]) ?Patient follows Dr. Delton Coombes ?He has known renal cell carcinoma with metastasis to the lung ?Patient states that he started immunotherapy 8 days prior to this admission. ?-Patient states oncologist told him he had only a few months to live and seem to be at peace with this. ?-Patient states " he will be going home soon" ?-I have added Dr. Delton Coombes to the patient care team and placed in a formal consultation. ?-Consulted palliative care for goals of care. ?-We will likely need outpatient follow-up with palliative care services. ?-Patient's CODE STATUS has been changed to a DNR. ? ?Constipation ?- Resolved with sorbitol. ?-Placed on Senokot-S nightly. ? ?Hypomagnesemia ?- Magnesium at 1.7. ?-Magnesium sulfate 4 g IV x1. ?-Repeat labs in the AM. ? ?Hypokalemia ?-Potassium at 3.4.   ?-Magnesium at 1.7.   ?-Magnesium 4 g IV x1.   ?-Place on K-Dur 40 mEq daily as patient on IV Lasix. ?-Repeat labs in the morning.  ? ?Leg edema ?Lower extremity Dopplers done 08/27/2021 negative for DVT. ?Albumin level at 3.1. ?-Place on Lasix 40 mg IV twice daily. ?-Follow. ? ?Mixed hyperlipidemia ?-Statin.  ? ?Tobacco abuse ?Tobacco cessation discussed ? ?Morbid obesity ([place]) ?BMI 41.09 ?Lifestyle modification. ?Outpatient follow-up with ? ?Essential hypertension ?-Controlled on current regimen of Norvasc, losartan. ?-Patient on IV Lasix.  ? ?Uncontrolled type 2 diabetes mellitus with hyperglycemia ([place]) ?07/19/2021 hemoglobin A1c 8.0 ?-CBG 211 this morning. ?-Patient on IV  steroids taper. ?-Continue to hold oral hypoglycemic agents. ?-Increase Semglee to 18 units daily.   ?-SSI.   ? ?Polyarthritis with positive rheumatoid factor (HCC) ?Patient takes methotrexate once weekly ?-Follow-up Dr. Benjamine Mola ? ? ? ? ?  ? ? ?DVT prophylaxis: Lovenox ?Code Status: DNR ?Family Communication: Updated patient.  No family at bedside. ?Disposition: Likely SNF with palliative care following hopefully in 2 to 3 days. ? ?Status is: Inpatient ?Remains inpatient appropriate because: Severity of illness ?  ?Consultants:  ?Interventional radiology ?Hematology/oncology ?Palliative care ? ?Procedures:  ?Chest x-ray 09/21/2021 ?Ultrasound-guided thoracentesis left yielding 1.6 L of pleural fluid per IR, Dr. Thornton Papas 09/21/2021 ? ?Antimicrobials:  ?Azithromycin 500 mg p.o. x1 09/21/2021 ?IV Rocephin 3//14 /2023x1 dose ? ? ?Subjective: ?Sitting up on the side of the bed.  Feels shortness of breath is improving daily.  States had significant urine output with diuretics yesterday, however not properly recorded.  Constipation resolved with oral sorbitol per patient yesterday.  Wheezing improving.  ? ?Objective: ?Vitals:  ? 09/25/21 0737 09/25/21 0744 09/25/21 1310 09/25/21 1516  ?BP:    113/70  ?Pulse:    97  ?Resp:    16  ?Temp:    98.1 ?F (36.7 ?C)  ?TempSrc:    Oral  ?SpO2: 97% 97% 98% 96%  ?Weight:      ?Height:      ? ? ?Intake/Output Summary (Last 24 hours) at 09/25/2021 1611 ?Last data filed at 09/25/2021 1103 ?Gross per 24 hour  ?  Intake 960 ml  ?Output 2600 ml  ?Net -1640 ml  ? ?Filed Weights  ? 09/21/21 1111 09/21/21 1826  ?Weight: (!) 137.4 kg 134.1 kg  ? ? ?Examination: ? ?General exam: NAD. ?Respiratory system: Decreasing diffuse coarse breath sounds.  Minimal expiratory wheezing.  Speaking in full sentences.  No use of accessory muscles of respiration.  ?Cardiovascular system: Regular rate and rhythm no murmurs rubs or gallops.  2+ bilateral lower extremity edema. ?Gastrointestinal system: Abdomen is nondistended,  soft and nontender. No organomegaly or masses felt. Normal bowel sounds heard. ?Central nervous system: Alert and oriented. No focal neurological deficits. ?Extremities: Symmetric 5 x 5 power. ?Skin: No ra

## 2021-09-25 NOTE — Assessment & Plan Note (Addendum)
-  Resolved with sorbitol. ?-Continue Senokot-S 1 tab qHS. ?

## 2021-09-26 DIAGNOSIS — E876 Hypokalemia: Secondary | ICD-10-CM | POA: Diagnosis not present

## 2021-09-26 DIAGNOSIS — J441 Chronic obstructive pulmonary disease with (acute) exacerbation: Secondary | ICD-10-CM | POA: Diagnosis not present

## 2021-09-26 DIAGNOSIS — I10 Essential (primary) hypertension: Secondary | ICD-10-CM | POA: Diagnosis not present

## 2021-09-26 DIAGNOSIS — J9621 Acute and chronic respiratory failure with hypoxia: Secondary | ICD-10-CM | POA: Diagnosis not present

## 2021-09-26 LAB — CBC
HCT: 36.5 % — ABNORMAL LOW (ref 39.0–52.0)
Hemoglobin: 10.6 g/dL — ABNORMAL LOW (ref 13.0–17.0)
MCH: 21.1 pg — ABNORMAL LOW (ref 26.0–34.0)
MCHC: 29 g/dL — ABNORMAL LOW (ref 30.0–36.0)
MCV: 72.6 fL — ABNORMAL LOW (ref 80.0–100.0)
Platelets: 235 10*3/uL (ref 150–400)
RBC: 5.03 MIL/uL (ref 4.22–5.81)
RDW: 18.2 % — ABNORMAL HIGH (ref 11.5–15.5)
WBC: 5.9 10*3/uL (ref 4.0–10.5)
nRBC: 0 % (ref 0.0–0.2)

## 2021-09-26 LAB — BASIC METABOLIC PANEL
Anion gap: 11 (ref 5–15)
BUN: 16 mg/dL (ref 6–20)
CO2: 35 mmol/L — ABNORMAL HIGH (ref 22–32)
Calcium: 8.3 mg/dL — ABNORMAL LOW (ref 8.9–10.3)
Chloride: 94 mmol/L — ABNORMAL LOW (ref 98–111)
Creatinine, Ser: 0.86 mg/dL (ref 0.61–1.24)
GFR, Estimated: >60 mL/min (ref >60)
Glucose, Bld: 209 mg/dL — ABNORMAL HIGH (ref 70–99)
Potassium: 3.7 mmol/L (ref 3.5–5.1)
Sodium: 140 mmol/L (ref 135–145)

## 2021-09-26 LAB — GLUCOSE, CAPILLARY
Glucose-Capillary: 194 mg/dL — ABNORMAL HIGH (ref 70–99)
Glucose-Capillary: 325 mg/dL — ABNORMAL HIGH (ref 70–99)
Glucose-Capillary: 286 mg/dL — ABNORMAL HIGH (ref 70–99)
Glucose-Capillary: 327 mg/dL — ABNORMAL HIGH (ref 70–99)

## 2021-09-26 LAB — CULTURE, BODY FLUID W GRAM STAIN -BOTTLE
Culture: NO GROWTH
Special Requests: ADEQUATE

## 2021-09-26 LAB — MAGNESIUM: Magnesium: 1.8 mg/dL (ref 1.7–2.4)

## 2021-09-26 MED ORDER — MAGNESIUM SULFATE 4 GM/100ML IV SOLN
4.0000 g | Freq: Once | INTRAVENOUS | Status: AC
  Administered 2021-09-26: 4 g via INTRAVENOUS
  Filled 2021-09-26: qty 100

## 2021-09-26 MED ORDER — INSULIN ASPART 100 UNIT/ML IJ SOLN
5.0000 [IU] | Freq: Three times a day (TID) | INTRAMUSCULAR | Status: DC
  Administered 2021-09-27 – 2021-09-29 (×8): 5 [IU] via SUBCUTANEOUS

## 2021-09-26 NOTE — TOC Progression Note (Signed)
Transition of Care (TOC) - Progression Note  ? ? ?Patient Details  ?Name: Derek Owen ?MRN: [id number] ?Date of Birth: November 07, 1965 ? ?Transition of Care (TOC) CM/SW Contact  ?Joaquin Courts, RN ?Phone Number: ?09/26/2021, 1:26 PM ? ?Clinical Narrative:    ?VM left for Melissa at [place] re authorization status.  ? ? ?Expected Discharge Plan: [place] ?Barriers to Discharge: Ship broker, Continued Medical Work up ? ?Expected Discharge Plan and Services ?Expected Discharge Plan: [place] ?In-house Referral: Clinical Social Work ?Discharge Planning Services: CM Consult ?Post Acute Care Choice: [place] ?Living arrangements for the past 2 months: Mobile Home ?                ?  ?  ?  ?  ?  ?  ?  ?  ?  ?  ? ? ?Social Determinants of Health (SDOH) Interventions ?  ? ?Readmission Risk Interventions ?Readmission Risk Prevention Plan 09/22/2021  ?Transportation Screening Complete  ?Medication Review Press photographer) Complete  ?[place] or Home Care Consult Complete  ?SW Recovery Care/Counseling Consult Complete  ?Palliative Care Screening (No Data)  ?Skilled Nursing Facility Complete  ?Some recent data might be hidden  ? ? ?

## 2021-09-26 NOTE — Progress Notes (Signed)
?PROGRESS NOTE ? ? ? ?Derek Owen  KTG:[id number] DOB: [birth year] DOA: 09/21/2021 ?PCP: Renee Rival, FNP  ? ? ?Chief Complaint  ?Patient presents with  ? Shortness of Breath  ? ? 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  Notably, the patient was recently mated to the hospital from 08/26/2021 to 08/27/2021 with similar symptoms.  CTA of the chest was negative for PE at that time.  He underwent left-sided thoracocentesis removing 1.1 L with symptomatic improvement.  In addition, the patient had a hospitalization from 07/18/2021 to 07/30/2021 for recurrent right-sided pleural effusion.  He subsequently underwent VATS and right pleural biopsy on 07/25/2021 performed by Dr. Cyndia Bent.  Pathology was suggestive of renal cell carcinoma.  The patient has since followed up with Dr. Delton Coombes.  The patient states that he has recently started cabometyx immunotherapy about 8 days prior to this admission.  He states that he is normally on 3 L nasal cannula at home but has become more short of breath.  He has also noted orthopnea type symptoms and increasing lower extremity edema.  Unfortunately, the patient continues to smoke.  He has a nonproductive cough.  There is no hemoptysis.  He has not had any fevers, chills, headache, neck pain, chest pain, nausea, vomiting, diarrhea, domino pain, dysuria, hematuria. ?In the ED, the patient was afebrile and hemodynamically stable with oxygen saturation 96-100% on 3 L.  BMP showed a sodium 139, potassium 3.1, bicarbonate 35, serum creatinine 0.95.  AST 25, ALT 21, alk phosphatase 111, total bilirubin 0.3, albumin 3.1.  WBC 6.5, hemoglobin 10.7, platelets 220,000.  BNP 34.  Chest x-ray showed bilateral pleural effusion, left greater than right.  The patient was  admitted for further evaluation and treatment of his acute on chronic respiratory failure.  ? ? ?Assessment & Plan: ? Principal Problem: ?  Acute on chronic respiratory failure with hypoxia (HCC) ?Active Problems: ?  Recurrent pleural effusion ?  COPD with acute exacerbation ([place]) ?  Metastasis to lung [facility]) ?  Polyarthritis with positive rheumatoid factor (HCC) ?  Uncontrolled type 2 diabetes mellitus with hyperglycemia ([place]) ?  Essential hypertension ?  Morbid obesity ([place]) ?  Tobacco abuse ?  Mixed hyperlipidemia ?  Leg edema ?  Hypokalemia ?  Pleural effusion on left ?  Hypomagnesemia ?  Constipation ? ? ? ?Assessment and Plan: ?* Acute on chronic respiratory failure with hypoxia (HCC) ?-Multifactorial secondary to recurrent malignant pleural effusions, COPD exacerbation, and deconditioning and volume overload. ?-Status post therapeutic ultrasound-guided thoracentesis 09/21/2021 per IR with 1.6 L of fluid removed.  ?-Gram stain of pleural fluid with no organisms seen.  Cultures pending with no growth to date.  AFB negative..  ?-Patient states clinical improvement postthoracentesis and had one done in February 2023. ?-Patient received dose of Lasix over the past 3 days with good urine output.  ?-Patient on Lasix 40 mg every 12 hours started on 09/25/2021 with urine output of 3.650 L over the past 24 hours.   ?-Patient is -5.6 L during this hospitalization (total accuracy). ?-Patient with clinical improvement. ?-May need a Pleurx drain if continued recurrent malignant pleural effusion. ?-Continue Pulmicort nebs, scheduled DuoNebs, IV Solu-Medrol taper, IV Lasix. ?-Primary oncologist notified of admission via epic. ?Limited echo to rule out pericardial effusion/tamponade ?Currently on 3 L nasal cannula  with sats of 96%. ?-Patient noted to be on 3 L nasal cannula at baseline. ?With BNP 34--?? low suspicion of CHF however patient with good diuresis on IV Lasix. ?-Strict I's and O's, daily weights.  ? ?Recurrent pleural  effusion ?Patient is status post VATS and R-pleural bx 07/25/21--Dr. Cyndia Bent ?-Requested for left-sided thoracocentesis 3/14--send fluid for cell count, culture, LDH, protein ?-May need to consider VATS/Pleurx catheter if continued reaccumulation ?-Last thoracocentesis 08/27/2021 removing 1.1 L ?-Hematologist/oncologist notified of admission via epic. ? ?COPD with acute exacerbation ([place]) ?-Continue scheduled DuoNebs, Pulmicort, Flonase, Claritin, PPI.  ?-Transitioned from IV Solu-Medrol to oral prednisone taper. ?-Supportive care. ? ?Metastasis to lung [facility]) ?Patient follows Dr. Delton Coombes ?He has known renal cell carcinoma with metastasis to the lung ?Patient states that he started immunotherapy 8 days prior to this admission. ?-Patient states oncologist told him he had only a few months to live and seem to be at peace with this. ?-Patient states " he will be going home soon" ?-I have added Dr. Delton Coombes to the patient care team and placed in a formal consultation. ?-Consulted palliative care for goals of care. ?-Will likely need outpatient follow-up with palliative care services. ?-Patient's CODE STATUS has been changed to a DNR. ? ?Constipation ?- Resolved with sorbitol. ?-Continue Senokot-S nightly. ? ?Hypomagnesemia ?- Magnesium at 1.8. ?-Magnesium sulfate 4 g IV x1. ?-Repeat labs in the AM. ? ?Hypokalemia ?-Potassium at 3.7.   ?-Magnesium at 1.8.   ?-Continue oral daily potassium supplementation as patient on IV Lasix. ?-Repeat labs in the morning. ? ?Leg edema ?Lower extremity Dopplers done 08/27/2021 negative for DVT. ?Albumin level at 3.1. ?-Continue Lasix 40 mg IV twice daily. ?-Follow. ? ?Mixed hyperlipidemia ?-Statin.  ? ?Tobacco abuse ?Tobacco cessation discussed ? ?Morbid obesity ([place]) ?BMI 41.09 ?Lifestyle modification. ?Outpatient follow-up with ? ?Essential hypertension ?-Controlled on current regimen of Norvasc, losartan, IV Lasix. ? ?Uncontrolled type 2 diabetes mellitus with hyperglycemia  ([place]) ?07/19/2021 hemoglobin A1c 8.0 ?-CBG 194 this morning. ?-Patient on steroid taper. ?-Continue to hold oral hypoglycemic agents. ?-Continue Semglee 18 units daily . ?-Placed on NovoLog 5 units 3 times daily meal coverage.  ?-SSI.   ? ?Polyarthritis with positive rheumatoid factor (HCC) ?Patient takes methotrexate once weekly ?-Follow-up Dr. Benjamine Mola ? ? ? ? ?  ? ? ?DVT prophylaxis: Lovenox ?Code Status: DNR ?Family Communication: Updated patient.  No family at bedside. ?Disposition: Likely SNF with palliative care following hopefully in 2 to 3 days. ? ?Status is: Inpatient ?Remains inpatient appropriate because: Severity of illness ?  ?Consultants:  ?Interventional radiology ?Hematology/oncology ?Palliative care ? ?Procedures:  ?Chest x-ray 09/21/2021 ?Ultrasound-guided thoracentesis left yielding 1.6 L of pleural fluid per IR, Dr. Thornton Papas 09/21/2021 ? ?Antimicrobials:  ?Azithromycin 500 mg p.o. x1 09/21/2021 ?IV Rocephin 3//14 /2023x1 dose ? ? ?Subjective: ?Laying in bed.  No chest pain.  Feels shortness of breath is improving with diuresis.  States having significant urine output.  Feels wheezing improving.   ? ?Objective: ?Vitals:  ? 09/26/21 0557 09/26/21 0802 09/26/21 1447 09/26/21 1502  ?BP: 116/76  (!) 116/93   ?Pulse: 96  (!) 109   ?Resp: 19  18   ?Temp: 97.8 ?F (36.6 ?C)  97.7 ?F (36.5 ?C)   ?TempSrc: Oral  Oral   ?SpO2: 98% 96% 98% 96%  ?Weight: 127.6 kg     ?Height:      ? ? ?Intake/Output Summary (Last 24 hours) at 09/26/2021 1751 ?Last data filed at 09/26/2021 1137 ?Gross per 24 hour  ?Intake --  ?  Output 2900 ml  ?Net -2900 ml  ? ?Filed Weights  ? 09/21/21 1826 09/25/21 1843 09/26/21 0557  ?Weight: 134.1 kg 129.7 kg 127.6 kg  ? ? ?Examination: ? ?General exam: NAD. ?Respiratory system: Decreased diffuse coarse breath sounds.  Minimal expiratory wheezing.  Fair air movement.  Speaking in full sentences.  No use of accessory muscles of respiration. ?Cardiovascular system: RRR no murmurs rubs or gallops.  1-2+  bilateral lower extremity edema.  ?Gastrointestinal system: Abdomen is soft, nontender, nondistended, positive bowel sounds.  No rebound.  No guarding.   ?Central nervous system: Alert and oriented. No focal neurol

## 2021-09-27 DIAGNOSIS — Z515 Encounter for palliative care: Secondary | ICD-10-CM | POA: Diagnosis not present

## 2021-09-27 DIAGNOSIS — J441 Chronic obstructive pulmonary disease with (acute) exacerbation: Secondary | ICD-10-CM | POA: Diagnosis not present

## 2021-09-27 DIAGNOSIS — C7800 Secondary malignant neoplasm of unspecified lung: Secondary | ICD-10-CM | POA: Diagnosis not present

## 2021-09-27 DIAGNOSIS — J9621 Acute and chronic respiratory failure with hypoxia: Secondary | ICD-10-CM | POA: Diagnosis not present

## 2021-09-27 DIAGNOSIS — I10 Essential (primary) hypertension: Secondary | ICD-10-CM | POA: Diagnosis not present

## 2021-09-27 DIAGNOSIS — Z7189 Other specified counseling: Secondary | ICD-10-CM | POA: Diagnosis not present

## 2021-09-27 DIAGNOSIS — E876 Hypokalemia: Secondary | ICD-10-CM | POA: Diagnosis not present

## 2021-09-27 LAB — BASIC METABOLIC PANEL
Anion gap: 11 (ref 5–15)
BUN: 18 mg/dL (ref 6–20)
CO2: 33 mmol/L — ABNORMAL HIGH (ref 22–32)
Calcium: 8.1 mg/dL — ABNORMAL LOW (ref 8.9–10.3)
Chloride: 93 mmol/L — ABNORMAL LOW (ref 98–111)
Creatinine, Ser: 0.89 mg/dL (ref 0.61–1.24)
GFR, Estimated: >60 mL/min (ref >60)
Glucose, Bld: 132 mg/dL — ABNORMAL HIGH (ref 70–99)
Potassium: 3.1 mmol/L — ABNORMAL LOW (ref 3.5–5.1)
Sodium: 137 mmol/L (ref 135–145)

## 2021-09-27 LAB — MAGNESIUM: Magnesium: 2 mg/dL (ref 1.7–2.4)

## 2021-09-27 LAB — GLUCOSE, CAPILLARY
Glucose-Capillary: 130 mg/dL — ABNORMAL HIGH (ref 70–99)
Glucose-Capillary: 147 mg/dL — ABNORMAL HIGH (ref 70–99)
Glucose-Capillary: 270 mg/dL — ABNORMAL HIGH (ref 70–99)
Glucose-Capillary: 385 mg/dL — ABNORMAL HIGH (ref 70–99)

## 2021-09-27 MED ORDER — ALBUMIN HUMAN 25 % IV SOLN
25.0000 g | Freq: Two times a day (BID) | INTRAVENOUS | Status: AC
  Administered 2021-09-27 (×2): 25 g via INTRAVENOUS
  Filled 2021-09-27 (×2): qty 100

## 2021-09-27 MED ORDER — PREDNISONE 20 MG PO TABS
40.0000 mg | ORAL_TABLET | Freq: Every day | ORAL | Status: DC
  Administered 2021-09-28 – 2021-09-29 (×2): 40 mg via ORAL
  Filled 2021-09-27 (×2): qty 2

## 2021-09-27 MED ORDER — POTASSIUM CHLORIDE CRYS ER 20 MEQ PO TBCR
40.0000 meq | EXTENDED_RELEASE_TABLET | ORAL | Status: AC
  Administered 2021-09-27 (×2): 40 meq via ORAL
  Filled 2021-09-27 (×2): qty 2

## 2021-09-27 MED ORDER — IPRATROPIUM-ALBUTEROL 0.5-2.5 (3) MG/3ML IN SOLN
3.0000 mL | Freq: Four times a day (QID) | RESPIRATORY_TRACT | Status: DC
Start: 2021-09-27 — End: 2021-09-28
  Administered 2021-09-27 – 2021-09-28 (×4): 3 mL via RESPIRATORY_TRACT
  Filled 2021-09-27 (×4): qty 3

## 2021-09-27 MED ORDER — POTASSIUM CHLORIDE CRYS ER 10 MEQ PO TBCR
40.0000 meq | EXTENDED_RELEASE_TABLET | Freq: Every day | ORAL | Status: DC
  Administered 2021-09-28 – 2021-09-29 (×2): 40 meq via ORAL
  Filled 2021-09-27 (×2): qty 4

## 2021-09-27 NOTE — Progress Notes (Signed)
Palliative:   ?Conference with bedside nursing staff related to patient medications.  ?Mr. Haywood is lying quietly in bed in a dark room. He makes and mostly keeps eye contact, is alert and oriented, able to make his needs known. We talk about his medications.  Mr. Riolo tells me that he is willing to continue to take all medications, but he wants them to be spread out.  ? ?Conference with attending, bedside nursing staff, [facility] team related to patient condition, needs, GOC, disposition.  ? ?Plan:    Continue to treat the treatable, STR at [facility], following up with oncology about further treatment, out patient palliative.  ? ?35 minutes  ?Quinn Axe, NP ?Palliative Medicine Team  ?Team phone [phone] ?Greater than 50% of this time was spent counseling and coordinating care related to the above assessment and plan.  ?

## 2021-09-27 NOTE — Progress Notes (Signed)
?PROGRESS NOTE ? ? ? ?Derek Owen  NFA:[id number] DOB: [birth year] DOA: 09/21/2021 ?PCP: Renee Rival, FNP  ? ? ?Chief Complaint  ?Patient presents with  ? Shortness of Breath  ? ? 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  Notably, the patient was recently mated to the hospital from 08/26/2021 to 08/27/2021 with similar symptoms.  CTA of the chest was negative for PE at that time.  He underwent left-sided thoracocentesis removing 1.1 L with symptomatic improvement.  In addition, the patient had a hospitalization from 07/18/2021 to 07/30/2021 for recurrent right-sided pleural effusion.  He subsequently underwent VATS and right pleural biopsy on 07/25/2021 performed by Dr. Cyndia Bent.  Pathology was suggestive of renal cell carcinoma.  The patient has since followed up with Dr. Delton Coombes.  The patient states that he has recently started cabometyx immunotherapy about 8 days prior to this admission.  He states that he is normally on 3 L nasal cannula at home but has become more short of breath.  He has also noted orthopnea type symptoms and increasing lower extremity edema.  Unfortunately, the patient continues to smoke.  He has a nonproductive cough.  There is no hemoptysis.  He has not had any fevers, chills, headache, neck pain, chest pain, nausea, vomiting, diarrhea, domino pain, dysuria, hematuria. ?In the ED, the patient was afebrile and hemodynamically stable with oxygen saturation 96-100% on 3 L.  BMP showed a sodium 139, potassium 3.1, bicarbonate 35, serum creatinine 0.95.  AST 25, ALT 21, alk phosphatase 111, total bilirubin 0.3, albumin 3.1.  WBC 6.5, hemoglobin 10.7, platelets 220,000.  BNP 34.  Chest x-ray showed bilateral pleural effusion, left greater than right.  The patient was  admitted for further evaluation and treatment of his acute on chronic respiratory failure.  ? ? ?Assessment & Plan: ? Principal Problem: ?  Acute on chronic respiratory failure with hypoxia (HCC) ?Active Problems: ?  Recurrent pleural effusion ?  COPD with acute exacerbation ([place]) ?  Metastasis to lung [facility]) ?  Polyarthritis with positive rheumatoid factor (HCC) ?  Uncontrolled type 2 diabetes mellitus with hyperglycemia ([place]) ?  Essential hypertension ?  Morbid obesity ([place]) ?  Tobacco abuse ?  Mixed hyperlipidemia ?  Leg edema ?  Hypokalemia ?  Pleural effusion on left ?  Hypomagnesemia ?  Constipation ? ? ? ?Assessment and Plan: ?* Acute on chronic respiratory failure with hypoxia (HCC) ?-Multifactorial secondary to recurrent malignant pleural effusions, COPD exacerbation, and deconditioning and volume overload. ?-Status post therapeutic ultrasound-guided thoracentesis 09/21/2021 per IR with 1.6 L of fluid removed.  ?-Gram stain of pleural fluid with no organisms seen.  Cultures pending with no growth to date.  AFB negative..  ?-Patient states clinical improvement postthoracentesis and had one done in February 2023. ?-Patient received dose of Lasix over the past 3 days with good urine output.  ?-Patient on Lasix 40 mg every 12 hours started on 09/25/2021 with urine output of 1.4-5 L over the past 24 hours.   ?-Patient is -5.5 L during this hospitalization (total accuracy). ?-Patient with clinical improvement. ?-May need a Pleurx drain if continued recurrent malignant pleural effusion. ?-Continue Pulmicort nebs, scheduled DuoNebs, steroid taper, IV Lasix. ?-Primary oncologist notified of admission via epic. ?Limited echo to rule out pericardial effusion/tamponade ?Currently on 3-4 L nasal cannula with  sats of 95%. ?-Patient noted to be on 3 L nasal cannula at baseline. ?With BNP 34--?? low suspicion of CHF however patient with good diuresis on IV Lasix. ?-We will give IV albumin x2 in addition to IV Lasix  today. ?-Strict I's and O's, daily weights.  ? ?Recurrent pleural effusion ?Patient is status post VATS and R-pleural bx 07/25/21--Dr. Cyndia Bent ?-Requested for left-sided thoracocentesis 3/14--send fluid for cell count, culture, LDH, protein ?-May need to consider VATS/Pleurx catheter if continued reaccumulation ?-Last thoracocentesis 08/27/2021 removing 1.1 L ?-Hematologist/oncologist notified of admission via epic. ? ?COPD with acute exacerbation ([place]) ?-Continue scheduled DuoNebs, Pulmicort, Flonase, Claritin, PPI.  ?-Transitioned from IV Solu-Medrol to oral prednisone taper. ?-Supportive care. ? ?Metastasis to lung [facility]) ?Patient follows Dr. Delton Coombes ?He has known renal cell carcinoma with metastasis to the lung ?Patient states that he started immunotherapy 8 days prior to this admission. ?-Patient states oncologist told him he had only a few months to live and seem to be at peace with this. ?-Patient states " he will be going home soon" ?-I have added Dr. Delton Coombes to the patient care team and placed in a formal consultation. ?-Consulted palliative care for goals of care. ?-Will likely need outpatient follow-up with palliative care services. ?-Patient's CODE STATUS has been changed to a DNR. ? ?Constipation ?- Resolved with sorbitol. ?-Continue Senokot-S nightly. ? ?Hypomagnesemia ?- Repleted.   ?-Magnesium at 2.0.   ?-Follow. ? ?Hypokalemia ?-Secondary to diuresis.   ?-Potassium at 3.1  ?-Magnesium at 2.0.   ?-K-Dur 40 mEq p.o. every 4 hours x2 doses today.   ?-Oral daily potassium supplementation 40 mEq daily to be resumed 09/28/2021.  ?-Repeat labs in the morning. ? ?Leg edema ?Lower extremity Dopplers done 08/27/2021 negative for DVT. ?Albumin level at 3.1. ?-Continue Lasix 40 mg IV twice daily. ?-Follow. ? ?Mixed hyperlipidemia ?-Statin.  ? ?Tobacco abuse ?Tobacco cessation discussed ? ?Morbid obesity ([place]) ?BMI 41.09 ?Lifestyle modification. ?Outpatient follow-up  ? ?Essential hypertension ?-Controlled on  current regimen of Norvasc, losartan, IV Lasix. ? ?Uncontrolled type 2 diabetes mellitus with hyperglycemia ([place]) ?07/19/2021 hemoglobin A1c 8.0 ?-CBG 130 this morning. ?-Patient on steroid taper. ?-Continue to hold oral hypoglycemic agents. ?-Continue Semglee 18 units daily . ?-Continue NovoLog 5 units 3 times daily meal coverage.  ?-SSI.   ? ?Polyarthritis with positive rheumatoid factor (HCC) ?Patient takes methotrexate once weekly ?-Follow-up Dr. Benjamine Mola ? ? ? ? ?  ? ? ?DVT prophylaxis: Lovenox ?Code Status: DNR ?Family Communication: Updated patient.  No family at bedside. ?Disposition: Likely SNF with palliative care following hopefully in 2 to 3 days. ? ?Status is: Inpatient ?Remains inpatient appropriate because: Severity of illness ?  ?Consultants:  ?Interventional radiology ?Hematology/oncology ?Palliative care ? ?Procedures:  ?Chest x-ray 09/21/2021 ?Ultrasound-guided thoracentesis left yielding 1.6 L of pleural fluid per IR, Dr. Thornton Papas 09/21/2021 ? ?Antimicrobials:  ?Azithromycin 500 mg p.o. x1 09/21/2021 ?IV Rocephin 3//14 /2023x1 dose ? ? ?Subjective: ?Laying in bed.  States trying to get some sleep.  Hoping medications can be spaced out as he stated he took 16 pills all at the same time this morning.  Some improvement with shortness of breath.  Significant urine output. ? ?Objective: ?Vitals:  ? 09/27/21 1322 09/27/21 1346 09/27/21 1405 09/27/21 1504  ?BP: 101/65 115/75  (!) 130/102  ?Pulse: (!) 104   (!) 109  ?Resp: 14 16    ?Temp: 98.5 ?F (36.9 ?C) 99.5 ?F (37.5 ?C)  98.5 ?F (36.9 ?C)  ?TempSrc: Oral Oral  Oral  ?SpO2:  98% 99% 99% 95%  ?Weight:      ?Height:      ? ? ?Intake/Output Summary (Last 24 hours) at 09/27/2021 1515 ?Last data filed at 09/27/2021 0500 ?Gross per 24 hour  ?Intake 840 ml  ?Output 775 ml  ?Net 65 ml  ? ?Filed Weights  ? 09/25/21 1843 09/26/21 0557 09/27/21 0512  ?Weight: 129.7 kg 127.6 kg 127.7 kg  ? ? ?Examination: ? ?General exam: NAD. ?Respiratory system: Decreasing diffuse coarse  breath sounds.  No significant wheezing noted.  Fair air movement.  Speaking in full sentences.  No use of accessory muscles of respiration.  ?Cardiovascular system: Regular rate rhythm no murmurs rubs or gallops.  1-2+

## 2021-09-27 NOTE — Progress Notes (Signed)
Physical Therapy Treatment ?Patient Details ?Name: Derek Owen ?MRN: [id number] ?DOB: [birth year] ?Today's Date: 09/27/2021 ? ? 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  Notably, the patient was recently mated to the hospital from 08/26/2021 to 08/27/2021 with similar symptoms.  CTA of the chest was negative for PE at that time.  He underwent left-sided thoracocentesis removing 1.1 L with symptomatic improvement.  In addition, the patient had a hospitalization from 07/18/2021 to 07/30/2021 for recurrent right-sided pleural effusion.  He subsequently underwent VATS and right pleural biopsy on 07/25/2021 performed by Dr. Cyndia Bent.  Pathology was suggestive of renal cell carcinoma.  The patient has since followed up with Dr. Delton Coombes.  The patient states that he has recently started cabometyx immunotherapy about 8 days prior to this admission.  He states that he is normally on 3 L nasal cannula at home but has become more short of breath.  He has also noted orthopnea type symptoms and increasing lower extremity edema.  Unfortunately, the patient continues to smoke.  He has a nonproductive cough.  There is no hemoptysis.  He has not had any fevers, chills, headache, neck pain, chest pain, nausea, vomiting, diarrhea, domino pain, dysuria, hematuria. ? ?  ?PT Comments  ? ? Patient continues with SOB and generalizes weakness.  He did demonstrate progress today needing no rest breaks with ambulation with RW and walking a further distance today.  He reports max fatigue at the end of walk.  Patient will continue to benefit from skilled therapy interventions to address deficits and promote optimal functional mobility.  ?   ?Recommendations for follow up therapy are one component of  a multi-disciplinary discharge planning process, led by the attending physician.  Recommendations may be updated based on patient status, additional functional criteria and insurance authorization. ? ?Follow Up Recommendations ? Skilled nursing-short term rehab (<3 hours/day) ?  ?  ?Assistance Recommended at Discharge Set up Supervision/Assistance  ?Patient can return home with the following   ?  ?Equipment Recommendations ?    ?  ?Recommendations for Other Services   ? ? ?  ?Precautions / Restrictions Precautions ?Precautions: Fall ?Restrictions ?Weight Bearing Restrictions: No  ?  ? ?Mobility ? Bed Mobility ?Overal bed mobility: Needs Assistance ?Bed Mobility: Supine to Sit ?  ?  ?Supine to sit: Supervision ?  ?  ?General bed mobility comments: labored movement, slightly increased time ?  ? ?Transfers ?Overall transfer level: Needs assistance ?Equipment used: Rolling walker (2 wheels) ?Transfers: Sit to/from Stand, Bed to chair/wheelchair/BSC ?Sit to Stand: Min guard ?  ?Step pivot transfers: Min guard ?  ?  ?  ?General transfer comment: slightly labored movement ?  ? ?Ambulation/Gait ?Ambulation/Gait assistance: Min guard ?Gait Distance (Feet): 75 Feet ?Assistive device: Rolling walker (2 wheels) ?Gait Pattern/deviations: Decreased step length - right, Decreased step length - left, Decreased stride length ?Gait velocity: decreased ?  ?  ?General Gait Details: slightly increased endurance/distance for gait training on 4L of O2; patient needs verbal cues for pacing but did not have to take a standing rest break during walk today; he is at max fatigue after walk; spO2 at 98% after walking although he is visibly SOB ? ? ?Stairs ?  ?  ?  ?  ?  ? ? ?Wheelchair Mobility ?  ? ?  Modified Rankin (Stroke Patients Only) ?  ? ? ?  ?Balance Overall balance assessment: Needs assistance ?Sitting-balance support: Feet supported, No upper extremity supported ?Sitting balance-Leahy Scale: Good ?Sitting balance - Comments: seated at  EOB ?  ?Standing balance support: During functional activity, Bilateral upper extremity supported, Reliant on assistive device for balance ?Standing balance-Leahy Scale: Good ?Standing balance comment: good using RW ?  ?  ?  ?  ?  ?  ?  ?  ?  ?  ?  ?  ? ?  ?Cognition Arousal/Alertness: Awake/alert ?Behavior During Therapy: [facility] for tasks assessed/performed ?Overall Cognitive Status: Within Functional Limits for tasks assessed ?  ?  ?  ?  ?  ?  ?  ?  ?  ?  ?  ?  ?  ?  ?  ?  ?  ?  ?  ? ?  ?Exercises   ? ?  ?General Comments   ?  ?  ? ?Pertinent Vitals/Pain Pain Assessment ?Pain Assessment: No/denies pain  ? ? ?Home Living   ?  ?  ?  ?  ?  ?  ?  ?  ?  ?   ?  ?Prior Function    ?  ?  ?   ? ?PT Goals (current goals can now be found in the care plan section) Acute Rehab PT Goals ?Patient Stated Goal: return home after rehab ?PT Goal Formulation: With patient ?Time For Goal Achievement: 10/06/21 ?Potential to Achieve Goals: Good ?Progress towards PT goals: Progressing toward goals ? ?  ?Frequency ? ? ? Min 3X/week ? ? ? ?  ?PT Plan Current plan remains appropriate  ? ? ?Co-evaluation   ?  ?  ?  ?  ? ?  ?AM-PAC PT "6 Clicks" Mobility   ?Outcome Measure ?   ?  ?  ?  ?  ?  ?  ? ?  ?End of Session Equipment Utilized During Treatment: Oxygen ?Activity Tolerance: Patient tolerated treatment well;Patient limited by fatigue ?Patient left: with call bell/phone within reach;in bed;with nursing/sitter in room ?Nurse Communication: Mobility status ?PT Visit Diagnosis: Unsteadiness on feet (R26.81);Other abnormalities of gait and mobility (R26.89);Muscle weakness (generalized) (M62.81) ?  ? ? ?Time: 1245-8099 ?PT Time Calculation (min) (ACUTE ONLY): 17 min ? ?Charges:  $Gait Training: 8-22 mins          ?          ? ?11:28 AM, 09/27/21 ?Marks Scalera Small Dan Dissinger MPT ?[facility] ?[place] [phone] ?Ph:[phone] ? ? ?

## 2021-09-27 NOTE — TOC Progression Note (Addendum)
Transition of Care (TOC) - Progression Note  ? ? ?Patient Details  ?Name: Derek Owen ?MRN: [id number] ?Date of Birth: June 02, 1966 ? ?Transition of Care (TOC) CM/SW Contact  ?Boneta Lucks, RN ?Phone Number: ?09/27/2021, 1:02 PM ? ?Clinical Narrative:   TOC checking with Melissa at [facility] for [name], not received. Patient is overloaded, Diuresing. Medically ready Wednesday? ? ? Addendum: Melissa at [facility] received [name]. MD updated.  ? ?Expected Discharge Plan: [place] ?Barriers to Discharge: Ship broker, Continued Medical Work up ? ?Expected Discharge Plan and Services ?Expected Discharge Plan: [place] ?In-house Referral: Clinical Social Work ?Discharge Planning Services: CM Consult ?Post Acute Care Choice: [place] ?Living arrangements for the past 2 months: Mobile Home ?                ?   ?

## 2021-09-28 LAB — BASIC METABOLIC PANEL
Anion gap: 13 (ref 5–15)
BUN: 18 mg/dL (ref 6–20)
CO2: 29 mmol/L (ref 22–32)
Calcium: 8.5 mg/dL — ABNORMAL LOW (ref 8.9–10.3)
Chloride: 97 mmol/L — ABNORMAL LOW (ref 98–111)
Creatinine, Ser: 0.88 mg/dL (ref 0.61–1.24)
GFR, Estimated: >60 mL/min (ref >60)
Glucose, Bld: 138 mg/dL — ABNORMAL HIGH (ref 70–99)
Potassium: 3.5 mmol/L (ref 3.5–5.1)
Sodium: 139 mmol/L (ref 135–145)

## 2021-09-28 LAB — CBC WITH DIFFERENTIAL/PLATELET
Abs Immature Granulocytes: 0.04 10*3/uL (ref 0.00–0.07)
Basophils Absolute: 0 10*3/uL (ref 0.0–0.1)
Basophils Relative: 0 %
Eosinophils Absolute: 0 10*3/uL (ref 0.0–0.5)
Eosinophils Relative: 0 %
HCT: 37.8 % — ABNORMAL LOW (ref 39.0–52.0)
Hemoglobin: 10.9 g/dL — ABNORMAL LOW (ref 13.0–17.0)
Immature Granulocytes: 1 %
Lymphocytes Relative: 16 %
Lymphs Abs: 1.2 10*3/uL (ref 0.7–4.0)
MCH: 21.3 pg — ABNORMAL LOW (ref 26.0–34.0)
MCHC: 28.8 g/dL — ABNORMAL LOW (ref 30.0–36.0)
MCV: 73.8 fL — ABNORMAL LOW (ref 80.0–100.0)
Monocytes Absolute: 0.7 10*3/uL (ref 0.1–1.0)
Monocytes Relative: 10 %
Neutro Abs: 5.5 10*3/uL (ref 1.7–7.7)
Neutrophils Relative %: 73 %
Platelets: 207 10*3/uL (ref 150–400)
RBC: 5.12 MIL/uL (ref 4.22–5.81)
RDW: 18.5 % — ABNORMAL HIGH (ref 11.5–15.5)
WBC: 7.5 10*3/uL (ref 4.0–10.5)
nRBC: 0 % (ref 0.0–0.2)

## 2021-09-28 LAB — GLUCOSE, CAPILLARY
Glucose-Capillary: 150 mg/dL — ABNORMAL HIGH (ref 70–99)
Glucose-Capillary: 149 mg/dL — ABNORMAL HIGH (ref 70–99)
Glucose-Capillary: 204 mg/dL — ABNORMAL HIGH (ref 70–99)
Glucose-Capillary: 294 mg/dL — ABNORMAL HIGH (ref 70–99)

## 2021-09-28 LAB — MAGNESIUM: Magnesium: 1.9 mg/dL (ref 1.7–2.4)

## 2021-09-28 MED ORDER — FUROSEMIDE 40 MG PO TABS
40.0000 mg | ORAL_TABLET | Freq: Every day | ORAL | Status: DC
  Administered 2021-09-29: 40 mg via ORAL
  Filled 2021-09-28: qty 1

## 2021-09-28 MED ORDER — IPRATROPIUM-ALBUTEROL 0.5-2.5 (3) MG/3ML IN SOLN
3.0000 mL | Freq: Two times a day (BID) | RESPIRATORY_TRACT | Status: DC
  Administered 2021-09-28 – 2021-09-29 (×3): 3 mL via RESPIRATORY_TRACT
  Filled 2021-09-28 (×3): qty 3

## 2021-09-28 NOTE — Progress Notes (Signed)
Rechecked heart rate once patient has rested from ambulation, noted 106 bpm. ? ? ? 09/27/21 2105  ?Assess: MEWS Score  ?BP 113/77  ?Pulse Rate (!) 118  ?SpO2 96 %  ?Assess: MEWS Score  ?MEWS Temp 0  ?MEWS Systolic 0  ?MEWS Pulse 2  ?MEWS RR 0  ?MEWS LOC 0  ?MEWS Score 2  ?MEWS Score Color Yellow  ?Assess: if the MEWS score is Yellow or Red  ?Were vital signs taken at a resting state? Yes ?(patient just went to restroom)  ?Focused Assessment No change from prior assessment  ?Early Detection of Sepsis Score *See Row Information* Low  ?MEWS guidelines implemented *See Row Information* No, vital signs rechecked  ? ? ?

## 2021-09-28 NOTE — Progress Notes (Signed)
?PROGRESS NOTE ? ? ? ?Derek Owen  QDI:[id number] DOB: Sep 22, 1965 DOA: 09/21/2021 ?PCP: Renee Rival, FNP  ? ? ?Chief Complaint  ?Patient presents with  ? Shortness of Breath  ? ? 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  Notably, the patient was recently mated to the hospital from 08/26/2021 to 08/27/2021 with similar symptoms.  CTA of the chest was negative for PE at that time.  He underwent left-sided thoracocentesis removing 1.1 L with symptomatic improvement.  In addition, the patient had a hospitalization from 07/18/2021 to 07/30/2021 for recurrent right-sided pleural effusion.  He subsequently underwent VATS and right pleural biopsy on 07/25/2021 performed by Dr. Cyndia Bent.  Pathology was suggestive of renal cell carcinoma.  The patient has since followed up with Dr. Delton Coombes.  The patient states that he has recently started cabometyx immunotherapy about 8 days prior to this admission.  He states that he is normally on 3 L nasal cannula at home but has become more short of breath.  He has also noted orthopnea type symptoms and increasing lower extremity edema.  Unfortunately, the patient continues to smoke.  He has a nonproductive cough.  There is no hemoptysis.  He has not had any fevers, chills, headache, neck pain, chest pain, nausea, vomiting, diarrhea, domino pain, dysuria, hematuria. ?In the ED, the patient was afebrile and hemodynamically stable with oxygen saturation 96-100% on 3 L.  BMP showed a sodium 139, potassium 3.1, bicarbonate 35, serum creatinine 0.95.  AST 25, ALT 21, alk phosphatase 111, total bilirubin 0.3, albumin 3.1.  WBC 6.5, hemoglobin 10.7, platelets 220,000.  BNP 34.  Chest x-ray showed bilateral pleural effusion, left greater than right.  The patient was  admitted for further evaluation and treatment of his acute on chronic respiratory failure.  ? ? ?Assessment & Plan: ? Principal Problem: ?  Acute on chronic respiratory failure with hypoxia (HCC) ?Active Problems: ?  Recurrent pleural effusion ?  COPD with acute exacerbation ([place]) ?  Metastasis to lung [facility]) ?  Polyarthritis with positive rheumatoid factor (HCC) ?  Uncontrolled type 2 diabetes mellitus with hyperglycemia ([place]) ?  Essential hypertension ?  Morbid obesity ([place]) ?  Tobacco abuse ?  Mixed hyperlipidemia ?  Leg edema ?  Hypokalemia ?  Pleural effusion on left ?  Hypomagnesemia ?  Constipation ? ? ? ?Assessment and Plan: ?* Acute on chronic respiratory failure with hypoxia (HCC) ?-Multifactorial secondary to recurrent malignant pleural effusions, COPD exacerbation, and deconditioning and volume overload. ?-Status post therapeutic ultrasound-guided thoracentesis 09/21/2021 per IR with 1.6 L of fluid removed.  ?-Gram stain of pleural fluid with no organisms seen.  Cultures pending with no growth to date.  AFB negative..  ?-Patient states clinical improvement postthoracentesis and had one done in February 2023. ?-Patient received dose of Lasix over the past few days with good urine output.  ?-Patient on Lasix 40 mg every 12 hours started on 09/25/2021 with urine output of 3.130 L over the past 24 hours.   ?-Patient is -7625 L during this hospitalization (?? Accuracy). ?-Current weight of 277 pounds from 295 pounds on admission ?-Patient with clinical improvement. ?-May need a Pleurx drain if continued recurrent malignant pleural effusion. ?-Continue Pulmicort nebs, scheduled DuoNebs, steroid taper. ?-Transition from IV Lasix to oral Lasix 40 mg daily. ?-Primary oncologist notified  of admission via epic. ?Limited echo to rule out pericardial effusion/tamponade ?Currently on 3L nasal cannula with sats of 98%. ?-Patient noted to be on 3 L nasal cannula at baseline. ?With BNP 34--?? low suspicion of CHF however  patient with good diuresis on IV Lasix. ?-Status post IV albumin x2. ?-Likely close to baseline at this time. ?-Strict I's and O's, daily weights.  ? ?Recurrent pleural effusion ?Patient is status post VATS and R-pleural bx 07/25/21--Dr. Cyndia Bent ?-Requested for left-sided thoracocentesis 3/14--send fluid for cell count, culture, LDH, protein ?-May need to consider VATS/Pleurx catheter if continued reaccumulation ?-Last thoracocentesis 08/27/2021 removing 1.1 L ?-Hematologist/oncologist notified of admission via epic. ? ?COPD with acute exacerbation ([place]) ?-Continue scheduled DuoNebs, Pulmicort, Flonase, Claritin, PPI.  ?-Transitioned from IV Solu-Medrol to oral prednisone taper. ?-Supportive care. ? ?Metastasis to lung [facility]) ?Patient follows Dr. Delton Coombes ?He has known renal cell carcinoma with metastasis to the lung ?Patient states that he started immunotherapy 8 days prior to this admission. ?-Patient states oncologist told him he had only a few months to live and seem to be at peace with this. ?-Patient states " he will be going home soon" ?-Dr. Delton Coombes added to the patient care team and placed in a formal consultation. ?-Consulted palliative care for goals of care. ?-Will likely need outpatient follow-up with palliative care services. ?-Patient's CODE STATUS has been changed to a DNR. ? ?Constipation ?- Resolved with sorbitol. ?-Continue Senokot-S nightly. ? ?Hypomagnesemia ?- Repleted.   ?-Magnesium at 1.9 ?-Follow. ? ?Hypokalemia ?-Secondary to diuresis.   ?-Potassium at 3.5 ?-Magnesium at 1.9 ?-Continue oral daily potassium supplementation.   ?-Repeat labs in the morning.  ? ?Leg edema ?Lower extremity Dopplers done 08/27/2021 negative for DVT. ?Albumin level at 3.1. ?-Improving with diuresis.   ?-Transition from IV Lasix to oral Lasix.  ?-Follow. ? ?Mixed hyperlipidemia ?-Statin.  ? ?Tobacco abuse ?Tobacco cessation discussed ? ?Morbid obesity ([place]) ?BMI 41.09 ?Lifestyle modification. ?Outpatient follow-up   ? ?Essential hypertension ?-Controlled on current regimen of Norvasc, losartan, Lasix. ? ?Uncontrolled type 2 diabetes mellitus with hyperglycemia ([place]) ?07/19/2021 hemoglobin A1c 8.0 ?-CBG 150 this morning. ?-Patient on steroid taper. ?-Continue to hold oral hypoglycemic agents. ?-Continue Semglee 18 units daily . ?-Continue NovoLog 5 units 3 times daily meal coverage.  ?-SSI.   ? ?Polyarthritis with positive rheumatoid factor (HCC) ?Patient takes methotrexate once weekly ?-Follow-up Dr. Benjamine Mola ? ? ? ? ?  ? ? ?DVT prophylaxis: Lovenox ?Code Status: DNR ?Family Communication: Updated patient.  No family at bedside. ?Disposition: Likely SNF with palliative care following tomorrow 09/29/2021. ? ?Status is: Inpatient ?Remains inpatient appropriate because: Severity of illness ?  ?Consultants:  ?Interventional radiology ?Hematology/oncology ?Palliative care ? ?Procedures:  ?Chest x-ray 09/21/2021 ?Ultrasound-guided thoracentesis left yielding 1.6 L of pleural fluid per IR, Dr. Thornton Papas 09/21/2021 ? ?Antimicrobials:  ?Azithromycin 500 mg p.o. x1 09/21/2021 ?IV Rocephin 3//14 /2023x1 dose ? ? ?Subjective: ?Laying in bed.  No chest pain.  Feels shortness of breath is improved with diuresis.  Overall feeling better. ? ?Objective: ?Vitals:  ? 09/28/21 0446 09/28/21 0500 09/28/21 0905 09/28/21 1601  ?BP: 120/89   (!) 122/94  ?Pulse: 91   (!) 103  ?Resp: 18   20  ?Temp: 97.6 ?F (36.4 ?C)   99.2 ?F (37.3 ?C)  ?TempSrc: Oral   Oral  ?SpO2: 98%  95% 98%  ?Weight:  126 kg    ?Height:      ? ? ?Intake/Output Summary (Last 24 hours) at 09/28/2021 1944 ?Last data filed  at 09/28/2021 1300 ?Gross per 24 hour  ?Intake 786.84 ml  ?Output 2680 ml  ?Net -1893.16 ml  ? ?Filed Weights  ? 09/26/21 0557 09/27/21 0512 09/28/21 0500  ?Weight: 127.6 kg 127.7 kg 126 kg  ? ? ?Examination: ? ?General exam: NAD. ?Respiratory system: Significantly improved coarse breath sounds.  No significant wheezing noted.  Fair air movement.  Speaking in full sentences.    ?Cardiovascular system: RRR no murmurs rubs or gallops.  1-2+ bilateral lower extremity edema. ?Gastrointestinal system: Abdomen is soft, nontender, nondistended, positive bowel sounds.  No rebound.  No guardi

## 2021-09-28 NOTE — Evaluation (Signed)
Occupational Therapy Evaluation ?Patient Details ?Name: Derek Owen ?MRN: [id number] ?DOB: [birth year] ?Today's Date: 09/28/2021 ? ? 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  Notably, the patient was recently mated to the hospital from 08/26/2021 to 08/27/2021 with similar symptoms.  CTA of the chest was negative for PE at that time.  He underwent left-sided thoracocentesis removing 1.1 L with symptomatic improvement.  In addition, the patient had a hospitalization from 07/18/2021 to 07/30/2021 for recurrent right-sided pleural effusion.  He subsequently underwent VATS and right pleural biopsy on 07/25/2021 performed by Dr. Cyndia Bent.  Pathology was suggestive of renal cell carcinoma.  The patient has since followed up with Dr. Delton Coombes.  The patient states that he has recently started cabometyx immunotherapy about 8 days prior to this admission.  He states that he is normally on 3 L nasal cannula at home but has become more short of breath.  He has also noted orthopnea type symptoms and increasing lower extremity edema.  Unfortunately, the patient continues to smoke.  He has a nonproductive cough.  There is no hemoptysis.  He has not had any fevers, chills, headache, neck pain, chest pain, nausea, vomiting, diarrhea, domino pain, dysuria, hematuria.  ? ?Clinical Impression ?  ?Pt agreeable to OT evaluation. Pt appears to be primarily limited by shortness of breath showing sings of SOB after ambulation to toilet but with SpO2 at 97% seated after toilet transfer. Pt demonstrates good B UE strength and independence with lower body dressing and toilet transfer. Pt is not recommended for further acute OT services and will be discharged to care of nursing staff for remaining  length of stay.  ?   ? ?Recommendations for follow up therapy are one component of a multi-disciplinary discharge planning process, led by the attending physician.  Recommendations may be updated based on patient status, additional functional criteria and insurance authorization.  ? ?Follow Up Recommendations ? No OT follow up  ?  ?Assistance Recommended at Discharge PRN  ?   ? ?  ?Functional Status Assessment ? Patient has not had a recent decline in their functional status  ?Equipment Recommendations ? None recommended by OT  ?  ?   ? ? ?  ?Precautions / Restrictions Precautions ?Precautions: Fall ?Restrictions ?Weight Bearing Restrictions: No  ? ?  ? ?Mobility Bed Mobility ?Overal bed mobility: Independent ?  ?  ?  ?  ?  ?  ?General bed mobility comments: Sit to supine without assist. ?  ? ?Transfers ?Overall transfer level: Modified independent ?  ?  ?  ?  ?  ?  ?  ?  ?General transfer comment: Pt showing sings of being SOB after transfer to toilet but recovered quickly once sitting. ?  ? ?  ?Balance Overall balance assessment: Mild deficits observed, not formally tested ?  ?  ?  ?  ?  ?  ?  ?  ?  ?  ?  ?  ?  ?  ?  ?  ?  ?  ?   ? ?ADL either performed or assessed with clinical judgement  ? ?ADL Overall ADL's : Independent ?  ?  ?  ?  ?  ?  ?  ?  ?  ?  ?  ?  ?  ?  ?  ?  ?  ?  ?  ?   ? ? ? ?  Vision Baseline Vision/History: 1 Wears glasses ?Ability to See in Adequate Light: 1 Impaired ?Patient Visual Report: No change from baseline ?Vision Assessment?: No apparent visual deficits  ?   ?Perception   ?  ?Praxis   ?  ? ?Pertinent Vitals/Pain Pain Assessment ?Pain Assessment: No/denies pain  ? ? ? ?Hand Dominance Left ?  ?Extremity/Trunk Assessment Upper Extremity Assessment ?Upper Extremity Assessment: Overall WFL for tasks assessed ?  ?Lower Extremity Assessment ?Lower Extremity Assessment: Defer to PT evaluation ?  ?Cervical / Trunk Assessment ?Cervical / Trunk Assessment: Normal ?  ?Communication  Communication ?Communication: No difficulties ?  ?Cognition Arousal/Alertness: Awake/alert ?Behavior During Therapy: [facility] for tasks assessed/performed ?Overall Cognitive Status: Within Functional Limits for tasks assessed ?  ?  ?  ?  ?  ?  ?  ?  ?  ?  ?  ?  ?  ?  ?  ?  ?  ?  ?  ?   ? ?  ?   ?  ?    ? ? ?Home Living Family/patient expects to be discharged to:: Private residence ?Living Arrangements: Alone ?Available Help at Discharge: Family;Friend(s);Available PRN/intermittently ?Type of Home: Mobile home ?Home Access: Stairs to enter ?Entrance Stairs-Number of Steps: 3 ?Entrance Stairs-Rails: Right ?Home Layout: One level ?  ?  ?Bathroom Shower/Tub: Tub/shower unit ?  ?Bathroom Toilet: Standard ?Bathroom Accessibility: Yes ?  ?Home Equipment: None ?  ?Additional Comments: History taken via PT note. ?  ? ?  ?Prior Functioning/Environment Prior Level of Function : Independent/Modified Independent ?  ?  ?  ?  ?  ?  ?Mobility Comments: Hydrographic surveyor, drives, "worked" as a Administrator, on disability (per PT note) ?ADLs Comments: Independent prior to December but pt reported getting weaker and having more difficulty with ADL and IADL tasks. Pt reports his cousin will sometimes bring him food. ?  ? ?  ?  ?   ?  ?   ?    ?  ?OT Goals(Current goals can be found in the care plan section) Acute Rehab OT Goals ?Patient Stated Goal: return home  ?OT Frequency:   ?  ? ?   ?  ?  ?  ?  ? ?  ?   ?  ?  ?  ?  ?  ?  ?  ?End of Session Equipment Utilized During Treatment: Oxygen (3 L) ? ?Activity Tolerance: Patient tolerated treatment well ?Patient left: in bed;with call bell/phone within reach ? ?OT Visit Diagnosis: Unsteadiness on feet (R26.81);Other abnormalities of gait and mobility (R26.89)  ?              ?Time: [id number] ?OT Time Calculation (min): 11 min ?Charges:  OT General Charges ?$OT Visit: 1 Visit ?OT Evaluation ?$OT Eval Low Complexity: 1 Low ? ?Larey Seat OT, MOT ? ?Larey Seat ?09/28/2021, 9:30  AM ?

## 2021-09-28 NOTE — TOC Progression Note (Signed)
Transition of Care (TOC) - Progression Note  ? ? ?Patient Details  ?Name: CAYSEN WHANG ?MRN: [id number] ?Date of Birth: 12-09-1965 ? ?Transition of Care (TOC) CM/SW Contact  ?Salome Arnt, LCSW ?Phone Number: ?09/28/2021, 10:36 AM ? ?Clinical Narrative: LCSW updated [place]. Anticipate d/c tomorrow.  ? ? ? ?Expected Discharge Plan: [place] ?Barriers to Discharge: Ship broker, Continued Medical Work up ? ?Expected Discharge Plan and Services ?Expected Discharge Plan: [place] ?In-house Referral: Clinical Social Work ?Discharge Planning Services: CM Consult ?Post Acute Care Choice: [place] ?Living arrangements for the past 2 months: Mobile Home ?                ?  ?  ?  ?  ?  ?  ?  ?  ?  ?  ? ? ?Social Determinants of Health (SDOH) Interventions ?  ? ?Readmission Risk Interventions ?Readmission Risk Prevention Plan 09/22/2021  ?Transportation Screening Complete  ?Medication Review Press photographer) Complete  ?New Town or Home Care Consult Complete  ?SW Recovery Care/Counseling Consult Complete  ?Palliative Care Screening (No Data)  ?Skilled Nursing Facility Complete  ?Some recent data might be hidden  ? ? ?

## 2021-09-29 ENCOUNTER — Inpatient Hospital Stay (HOSPITAL_COMMUNITY): Payer: Medicaid Other

## 2021-09-29 DIAGNOSIS — K5900 Constipation, unspecified: Secondary | ICD-10-CM

## 2021-09-29 LAB — BASIC METABOLIC PANEL
Anion gap: 10 (ref 5–15)
BUN: 22 mg/dL — ABNORMAL HIGH (ref 6–20)
CO2: 30 mmol/L (ref 22–32)
Calcium: 8.5 mg/dL — ABNORMAL LOW (ref 8.9–10.3)
Chloride: 101 mmol/L (ref 98–111)
Creatinine, Ser: 0.95 mg/dL (ref 0.61–1.24)
GFR, Estimated: >60 mL/min (ref >60)
Glucose, Bld: 123 mg/dL — ABNORMAL HIGH (ref 70–99)
Potassium: 3.8 mmol/L (ref 3.5–5.1)
Sodium: 141 mmol/L (ref 135–145)

## 2021-09-29 LAB — MAGNESIUM: Magnesium: 1.9 mg/dL (ref 1.7–2.4)

## 2021-09-29 LAB — GLUCOSE, CAPILLARY
Glucose-Capillary: 142 mg/dL — ABNORMAL HIGH (ref 70–99)
Glucose-Capillary: 181 mg/dL — ABNORMAL HIGH (ref 70–99)
Glucose-Capillary: 196 mg/dL — ABNORMAL HIGH (ref 70–99)
Glucose-Capillary: 242 mg/dL — ABNORMAL HIGH (ref 70–99)

## 2021-09-29 MED ORDER — PREDNISONE 20 MG PO TABS: 40.0000 mg | ORAL_TABLET | Freq: Every day | ORAL | 0 refills | Status: AC

## 2021-09-29 MED ORDER — FUROSEMIDE 40 MG PO TABS: 40.0000 mg | ORAL_TABLET | Freq: Every day | ORAL | Status: AC

## 2021-09-29 MED ORDER — IPRATROPIUM-ALBUTEROL 0.5-2.5 (3) MG/3ML IN SOLN: 3.0000 mL | Freq: Two times a day (BID) | RESPIRATORY_TRACT | Status: AC

## 2021-09-29 MED ORDER — PANTOPRAZOLE SODIUM 40 MG PO TBEC: 40.0000 mg | DELAYED_RELEASE_TABLET | Freq: Every day | ORAL | Status: AC

## 2021-09-29 MED ORDER — LOSARTAN POTASSIUM 100 MG PO TABS: 100.0000 mg | ORAL_TABLET | Freq: Every day | ORAL | Status: AC

## 2021-09-29 MED ORDER — ACETAMINOPHEN 325 MG PO TABS
650.0000 mg | ORAL_TABLET | Freq: Four times a day (QID) | ORAL | 0 refills | Status: AC | PRN
Start: 2021-09-29 — End: ?

## 2021-09-29 MED ORDER — FLUTICASONE PROPIONATE 50 MCG/ACT NA SUSP: 2.0000 | Freq: Every day | NASAL | 2 refills | Status: AC

## 2021-09-29 MED ORDER — BUDESONIDE 0.5 MG/2ML IN SUSP: 0.5000 mg | Freq: Two times a day (BID) | RESPIRATORY_TRACT | 12 refills | Status: AC

## 2021-09-29 MED ORDER — SENNOSIDES-DOCUSATE SODIUM 8.6-50 MG PO TABS: 1.0000 | ORAL_TABLET | Freq: Every day | ORAL | Status: AC

## 2021-09-29 MED ORDER — POTASSIUM CHLORIDE CRYS ER 20 MEQ PO TBCR: 40.0000 meq | EXTENDED_RELEASE_TABLET | Freq: Every day | ORAL | Status: AC

## 2021-09-29 MED ORDER — ONDANSETRON HCL 4 MG PO TABS
4.0000 mg | ORAL_TABLET | Freq: Four times a day (QID) | ORAL | 0 refills | Status: AC | PRN
Start: 2021-09-29 — End: ?

## 2021-09-29 MED ORDER — LORATADINE 10 MG PO TABS
10.0000 mg | ORAL_TABLET | Freq: Every day | ORAL | Status: AC
Start: 2021-09-30 — End: ?

## 2021-09-29 NOTE — Discharge Summary (Signed)
Physician Discharge Summary   Patient: Derek Owen MRN: [id number] DOB: 1965-08-13  Admit date:     09/21/2021  Discharge date: 09/29/21  Discharge Physician: Marguerita Merles, DO   PCP: Donell Beers, FNP   Recommendations at discharge:   Follow up with Palliative Care in the outpatient setting Follow up with Medical Oncology Dr. Ellin Saba Follow up with TCTS Dr. Laneta Simmers and Consider VATS/Pleurx Catheter in the outpatient setting if continues to have reaccumulation  Repeat CXR in 3-6 weeks Follow up with PCP within 1-2 weeks and repeat CBC, CMP, Mag, Phos within 1 week   Discharge Diagnoses: Principal Problem:   Acute on chronic respiratory failure with hypoxia (HCC) Active Problems:   Recurrent pleural effusion   COPD with acute exacerbation (HCC)   Metastasis to lung (HCC)   Polyarthritis with positive rheumatoid factor (HCC)   Uncontrolled type 2 diabetes mellitus with hyperglycemia (HCC)   Essential hypertension   Morbid obesity (HCC)   Tobacco abuse   Mixed hyperlipidemia   Leg edema   Hypokalemia   Pleural effusion on left   Hypomagnesemia   Constipation  Resolved Problems:   * No resolved hospital problems. 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  Notably, the patient was recently mated to the hospital from 08/26/2021 to 08/27/2021 with similar symptoms.  CTA of the chest was negative for PE at that time.  He underwent left-sided thoracocentesis removing 1.1 L with symptomatic improvement.  In addition, the patient had a hospitalization from 07/18/2021 to 07/30/2021 for recurrent right-sided pleural effusion.  He subsequently underwent VATS and right pleural biopsy on 07/25/2021 performed by Dr. Laneta Simmers.  Pathology was suggestive  of renal cell carcinoma.  The patient has since followed up with Dr. Ellin Saba.  The patient states that he has recently started cabometyx immunotherapy about 8 days prior to this admission.    He states that he is normally on 3 L nasal cannula at home but has become more short of breath.  He has also noted orthopnea type symptoms and increasing lower extremity edema.  Unfortunately, the patient continues to smoke.  He has a nonproductive cough.  There is no hemoptysis.  He has not had any fevers, chills, headache, neck pain, chest pain, nausea, vomiting, diarrhea, domino pain, dysuria, hematuria.  In the ED, the patient was afebrile and hemodynamically stable with oxygen saturation 96-100% on 3 L.  BMP showed a sodium 139, potassium 3.1, bicarbonate 35, serum creatinine 0.95.  AST 25, ALT 21, alk phosphatase 111, total bilirubin 0.3, albumin 3.1.  WBC 6.5, hemoglobin 10.7, platelets 220,000.  BNP 34.  Chest x-ray showed bilateral pleural effusion, left greater than right.  The patient was admitted for further evaluation and treatment of his acute on chronic respiratory failure.  **Interim History  Patient underwent a repeat thoracentesis on 09/21/2020 and he improved.  1.6 L of fluid removed.  Was also diuresed with IV Lasix with good urine output.  His respiratory status improved back to his baseline and he was deemed stable for discharge with outpatient consideration of Pleurx drain/VATS if he continues to have recurrent pleural malignant effusion.  Patient has been transitioned to oral diuresis with Lasix and has been deemed stable for discharge.  Repeat chest x-ray today showed "Unchanged small bilateral pleural  effusions with evidence of loculation on the right. Similar bibasilar opacities, likely due to atelectasis."  Assessment and Plan: * Acute on chronic respiratory failure with hypoxia (HCC) -Multifactorial secondary to recurrent malignant pleural effusions, COPD exacerbation, and  deconditioning and volume overload. -Status post therapeutic ultrasound-guided thoracentesis 09/21/2021 per IR with 1.6 L of fluid removed.  -Gram stain of pleural fluid with no organisms seen.  Cultures pending with no growth to date at 5 Days.  AFB negative..  -Patient states clinical improvement postthoracentesis and had one done in February 2023. -Patient received dose of Lasix over the past few days with good urine output.  -Patient was Lasix 40 mg every 12 hours and transitioned to po Lasix -Patient is -7665 L during this hospitalization (?? Accuracy). -Weight went from 303 -> 281 -Patient with clinical improvement. -May need a Pleurx drain if continued recurrent malignant pleural effusion but this can be considered in the outpatient setting -Continue Pulmicort nebs, scheduled DuoNebs, steroid taper. -Transition from IV Lasix to oral Lasix 40 mg daily. -Primary oncologist notified of admission via epic. Limited echo to rule out pericardial effusion/tamponade Currently on 3L nasal cannula with sats of 98%. -Patient noted to be on 3 L nasal cannula at baseline. -SpO2: 99 % O2 Flow Rate (L/min): 3 L/min -With BNP 34--?? low suspicion of CHF however patient with good diuresis on IV Lasix. -Status post IV albumin x2. -Likely close to baseline at this time. -Strict I's and O's, daily weights.  -Follow up with PCP, Pulmonary and TCTS in the outpatient setting with repeat CXR in 3-6 weeks  Recurrent pleural effusion -Patient is status post VATS and R-pleural bx 07/25/21--Dr. Laneta Simmers -Requested for left-sided thoracocentesis 3/14--send fluid for cell count, culture, LDH, protein -May need to consider VATS/Pleurx catheter if continued reaccumulation -Last thoracocentesis 08/27/2021 removing 1.1 L -Hematologist/oncologist notified of admission via epic and will follow up with outpatient  -C/w Furosemide Daily -Repeat CXR in 3-6 weeks   COPD with acute exacerbation (HCC) -Continue scheduled  DuoNebs, Pulmicort, Flonase, Claritin, PPI.  -Transitioned from IV Solu-Medrol to oral prednisone taper and will continue Prednisone 40 mg daily x3 days -Repeat CXR in 3-6 weeks and follow up with Pulmonary within 1-2 weeks   Metastasis to lung [facility]) -Patient follows Dr. Ellin Saba -He has known renal cell carcinoma with metastasis to the lung -Patient states that he started immunotherapy 8 days prior to this admission. -Patient states oncologist told him he had only a few months to live and seem to be at peace with this. -Patient states " he will be going home soon" -Dr. Ellin Saba added to the patient care team and placed in a formal consultation. -Consulted palliative care for goals of care and will continue to treat the Treatable -C/w Cabozatinib 40 mg po Daily  -Will likely need outpatient follow-up with palliative care services. -Patient's CODE STATUS has been changed to a DNR.  Constipation -Resolved with sorbitol. -Continue Senokot-S 1 tab qHS.  Hypomagnesemia -Repleted.   -Magnesium at 1.9 today -Continue to Monitor and Replete as Necessary -Repeat Mag within 1 week  Hypokalemia -Secondary to diuresis.   -Potassium at 3.5 yesterday and today is 3.8 -Magnesium at 1.9 -Continue oral daily potassium supplementation.   -Repeat CMP and Mag within 1 week    Leg edema -Lower extremity Dopplers done 08/27/2021 negative for DVT. -Albumin level at 3.1 on last check. -Improving with diuresis.   -Transitioned from IV Lasix to oral Lasix.  -Follow in the outpatient setting   Mixed  hyperlipidemia -C/w Atorvastatin 40 mg po Daily    Tobacco abuse -Tobacco cessation discussed and counseling given   Morbid obesity (HCC) -Now Just Obese based on BMI -Complicates overall prognosis and care -Estimated body mass index is 38.24 kg/m as calculated from the following:   Height as of this encounter: 6' (1.829 m).   Weight as of this encounter: 127.9 kg.  -Weight Loss and Dietary  Counseling given   Essential hypertension -Controlled on current regimen of Norvasc, losartan, Lasix. -Continue to monitor BP per Protocol -Last BP reading was 119/83  Uncontrolled type 2 diabetes mellitus with hyperglycemia (HCC) -07/19/2021 hemoglobin A1c 8.0 -CBG's ranging from 149-294 -Patient on steroid taper and continuing po Prednisone 40 mg daily x 3 days -Continue to hold oral hypoglycemic agents and resume in the outpatient setting  -Continue Semglee 18 units daily while hospitlaized. -Continue NovoLog 5 units 3 times daily meal coverage.  -SSI.    Polyarthritis with positive rheumatoid factor (HCC) -Patient takes methotrexate once weekly -Follow-up Dr. Dimple Casey in the outpatient setting    Pain control - [facility] Controlled Substance Reporting System database was reviewed. and patient was instructed, not to drive, operate heavy machinery, perform activities at heights, swimming or participation in water activities or provide baby-sitting services while on Pain, Sleep and Anxiety Medications; until their outpatient Physician has advised to do so again. Also recommended to not to take more than prescribed Pain, Sleep and Anxiety Medications.   Consultants: IR, Palliative Care, Oncology notified via EPIC Procedures performed: Thoracentesis   Disposition: Skilled nursing facility Diet recommendation:  Carb modified diet DISCHARGE MEDICATION: Allergies as of 09/29/2021   No Known Allergies      Medication List     STOP taking these medications    losartan-hydrochlorothiazide 100-25 MG tablet Commonly known as: HYZAAR       TAKE these medications    acetaminophen 325 MG tablet Commonly known as: TYLENOL Take 2 tablets (650 mg total) by mouth every 6 (six) hours as needed for mild pain (or Fever >/= 101).   albuterol 108 (90 Base) MCG/ACT inhaler Commonly known as: VENTOLIN HFA Inhale 1-2 puffs into the lungs every 4 (four) hours as needed for wheezing or  shortness of breath.   amLODipine 10 MG tablet Commonly known as: NORVASC Take 1 tablet (10 mg total) by mouth daily.   aspirin 81 MG chewable tablet Chew 81 mg by mouth daily.   atorvastatin 40 MG tablet Commonly known as: LIPITOR Take 1 tablet (40 mg total) by mouth daily.   budesonide 0.5 MG/2ML nebulizer solution Commonly known as: PULMICORT Take 2 mLs (0.5 mg total) by nebulization 2 (two) times daily.   Cabometyx 40 MG tablet Generic drug: cabozantinib Take 1 tablet (40 mg total) by mouth daily. Take on an empty stomach, 1 hour before or 2 hours after meals.   fluticasone 50 MCG/ACT nasal spray Commonly known as: FLONASE Place 2 sprays into both nostrils daily. Start taking on: September 30, 2021   folic acid 1 MG tablet Commonly known as: FOLVITE Take 1 tablet (1 mg total) by mouth daily.   furosemide 40 MG tablet Commonly known as: LASIX Take 1 tablet (40 mg total) by mouth daily. Start taking on: September 30, 2021   glipiZIDE 10 MG tablet Commonly known as: GLUCOTROL Take 1 tablet (10 mg total) by mouth daily before breakfast.   ipratropium-albuterol 0.5-2.5 (3) MG/3ML Soln Commonly known as: DUONEB Take 3 mLs by nebulization 2 (two) times daily.  loratadine 10 MG tablet Commonly known as: CLARITIN Take 1 tablet (10 mg total) by mouth daily. Start taking on: September 30, 2021   losartan 100 MG tablet Commonly known as: COZAAR Take 1 tablet (100 mg total) by mouth daily. Start taking on: September 30, 2021   methotrexate 2.5 MG tablet Commonly known as: RHEUMATREX Take 10 tablets (25 mg total) by mouth once a week. What changed:  how much to take additional instructions   ondansetron 4 MG tablet Commonly known as: ZOFRAN Take 1 tablet (4 mg total) by mouth every 6 (six) hours as needed for nausea.   pantoprazole 40 MG tablet Commonly known as: PROTONIX Take 1 tablet (40 mg total) by mouth daily at 6 (six) AM. Start taking on: September 30, 2021   potassium  chloride SA 20 MEQ tablet Commonly known as: KLOR-CON M Take 2 tablets (40 mEq total) by mouth daily. Start taking on: September 30, 2021   predniSONE 20 MG tablet Commonly known as: DELTASONE Take 2 tablets (40 mg total) by mouth daily before breakfast for 2 days. Start taking on: September 30, 2021   Rybelsus 14 MG Tabs Generic drug: Semaglutide Take 14 mg by mouth daily.   senna-docusate 8.6-50 MG tablet Commonly known as: Senokot-S Take 1 tablet by mouth at bedtime.               Durable Medical Equipment  (From admission, onward)           Start     Ordered   09/27/21 0922  For home use only DME 4 wheeled rolling walker with seat  Once       Question:  Patient needs a walker to treat with the following condition  Answer:  Debility   09/27/21 0921            Contact information for after-discharge care     Destination     [facility] .   Service: Skilled Nursing Contact information: [address] [place] [address] [phone]                    Discharge Exam: Ceasar Mons Weights   09/27/21 0512 09/28/21 0500 09/29/21 0500  Weight: 127.7 kg 126 kg 127.9 kg   Vitals:   09/29/21 0513 09/29/21 0805  BP: 124/89 119/83  Pulse: 87 99  Resp: 16 18  Temp: 98.4 F (36.9 C) 98.1 F (36.7 C)  SpO2: 98% 99%   Examination: Physical Exam:  Constitutional: WN/WD obese AAM in NAD and appears calm and comfortable Respiratory: Diminished to auscultation bilaterally at the bases with slight coarse breath sounds and crackles, no wheezing, rales, rhonchi. Normal respiratory effort and patient is not tachypenic. No accessory muscle use. Wearing 3 Liters of Supplemental O2 via [place] Cardiovascular: RRR, no murmurs / rubs / gallops. S1 and S2 auscultated. Mild 1+ LE edema  Abdomen: Soft, non-tender, Distended 2/2 body habitus. Bowel sounds positive.  GU: Deferred. Musculoskeletal: No clubbing / cyanosis of digits/nails. No joint deformity  upper and lower extremities. Neurologic: CN 2-12 grossly intact with no focal deficits.  Condition at discharge: stable  The results of significant diagnostics from this hospitalization (including imaging, microbiology, ancillary and laboratory) are listed below for reference.   Imaging Studies: DG Chest 1 View  Result Date: 09/21/2021 CLINICAL DATA:  LEFT pleural effusion post thoracentesis EXAM: CHEST  1 VIEW COMPARISON:  Extra tori AP radiograph at 1535 hours compared to earlier study of 09/21/2021 at  1146 hours FINDINGS: Enlargement of cardiac silhouette. Mediastinal contours and pulmonary vascularity normal. Decreased LEFT pleural effusion and basilar atelectasis post thoracentesis. Persistent partially loculated RIGHT pleural effusion and basilar atelectasis. No pneumothorax following thoracentesis. IMPRESSION: No pneumothorax following LEFT thoracentesis. Electronically Signed   By: Ulyses Southward M.D.   On: 09/21/2021 16:10   DG CHEST PORT 1 VIEW  Result Date: 09/29/2021 CLINICAL DATA:  Shortness of breath EXAM: PORTABLE CHEST 1 VIEW COMPARISON:  Chest x-ray dated September 21, 2021 FINDINGS: Cardiac and mediastinal contours are unchanged. Unchanged small bilateral pleural effusions with evidence of loculation on the right. Associated basilar opacities are also unchanged when compared to prior exam and likely due to atelectasis. No new parenchymal opacity. No evidence pneumothorax. IMPRESSION: 1. Unchanged small bilateral pleural effusions with evidence of loculation on the right. 2. Similar bibasilar opacities, likely due to atelectasis. Electronically Signed   By: Allegra Lai M.D.   On: 09/29/2021 10:13   DG Chest Portable 1 View  Result Date: 09/21/2021 CLINICAL DATA:  Shortness of breath.  Weakness. EXAM: PORTABLE CHEST 1 VIEW COMPARISON:  One-view chest x-ray 08/27/2021.  CTA chest 08/26/2021. FINDINGS: Heart is obscured by bilateral opacities. Progressive bilateral pleural effusions and  basilar airspace disease is noted. Moderate pulmonary vascular congestion is present. IMPRESSION: Progressive bilateral pleural effusions and basilar airspace disease. Findings are concerning for multi lobar pneumonia. Electronically Signed   By: Marin Roberts M.D.   On: 09/21/2021 11:57   US THORACENTESIS ASP PLEURAL SPACE W/IMG GUIDE  Result Date: 09/21/2021 INDICATION: Shortness of breath, recurrent LEFT pleural effusion EXAM: ULTRASOUND GUIDED DIAGNOSTIC AND THERAPEUTIC LEFT THORACENTESIS MEDICATIONS: None. COMPLICATIONS: None immediate. PROCEDURE: An ultrasound guided thoracentesis was thoroughly discussed with the patient and questions answered. The benefits, risks, alternatives and complications were also discussed. The patient understands and wishes to proceed with the procedure. Written consent was obtained. Ultrasound was performed to localize and mark an adequate pocket of fluid in the LEFT chest. The area was then prepped and draped in the normal sterile fashion. 1% Lidocaine was used for local anesthesia. Under ultrasound guidance a 6 Fr Safe-T-Centesis catheter was introduced. Thoracentesis was performed. The catheter was removed and a dressing applied. FINDINGS: A total of approximately 1.6 L of dark old bloody LEFT pleural fluid was removed. Samples were sent to the laboratory as requested by the clinical team. IMPRESSION: Successful ultrasound guided LEFT thoracentesis yielding 1.6 L of pleural fluid. Electronically Signed   By: Ulyses Southward M.D.   On: 09/21/2021 16:22    Microbiology: Results for orders placed or performed during the hospital encounter of 09/21/21  Resp Panel by RT-PCR (Flu A&B, Covid) Nasopharyngeal Swab     Status: None   Collection Time: 09/21/21 11:37 AM   Specimen: Nasopharyngeal Swab; Nasopharyngeal(NP) swabs in vial transport medium  Result Value Ref Range Status   SARS Coronavirus 2 by RT PCR NEGATIVE NEGATIVE Final    Comment: (NOTE) SARS-CoV-2 target  nucleic acids are NOT DETECTED.  The SARS-CoV-2 RNA is generally detectable in upper respiratory specimens during the acute phase of infection. The lowest concentration of SARS-CoV-2 viral copies this assay can detect is 138 copies/mL. A negative result does not preclude SARS-Cov-2 infection and should not be used as the sole basis for treatment or other patient management decisions. A negative result may occur with  improper specimen collection/handling, submission of specimen other than nasopharyngeal swab, presence of viral mutation(s) within the areas targeted by this assay, and inadequate number of viral  copies(<138 copies/mL). A negative result must be combined with clinical observations, patient history, and epidemiological information. The expected result is Negative.  Fact Sheet for Patients:  BloggerCourse.com  Fact Sheet for Healthcare Providers:  SeriousBroker.it  This test is no t yet approved or cleared by the [place] FDA and  has been authorized for detection and/or diagnosis of SARS-CoV-2 by FDA under an Emergency Use Authorization (EUA). This EUA will remain  in effect (meaning this test can be used) for the duration of the COVID-19 declaration under Section 564(b)(1) of the Act, 21 U.S.C.section 360bbb-3(b)(1), unless the authorization is terminated  or revoked sooner.       Influenza A by PCR NEGATIVE NEGATIVE Final   Influenza B by PCR NEGATIVE NEGATIVE Final    Comment: (NOTE) The Xpert Xpress SARS-CoV-2/FLU/RSV plus assay is intended as an aid in the diagnosis of influenza from Nasopharyngeal swab specimens and should not be used as a sole basis for treatment. Nasal washings and aspirates are unacceptable for Xpert Xpress SARS-CoV-2/FLU/RSV testing.  Fact Sheet for Patients: BloggerCourse.com  Fact Sheet for Healthcare  Providers: SeriousBroker.it  This test is not yet approved or cleared by the [place] FDA and has been authorized for detection and/or diagnosis of SARS-CoV-2 by FDA under an Emergency Use Authorization (EUA). This EUA will remain in effect (meaning this test can be used) for the duration of the COVID-19 declaration under Section 564(b)(1) of the Act, 21 U.S.C. section 360bbb-3(b)(1), unless the authorization is terminated or revoked.  Performed at [facility], [address], [place], [place] [address]   Culture, body fluid w Gram Stain-bottle     Status: None   Collection Time: 09/21/21  2:33 PM   Specimen: Pleura  Result Value Ref Range Status   Specimen Description PLEURAL  Final   Special Requests   Final    BOTTLES DRAWN AEROBIC AND ANAEROBIC Blood Culture adequate volume   Culture   Final    NO GROWTH 5 DAYS Performed at [place] [facility], [address], [place], [place] [address]    Report Status 09/26/2021 FINAL  Final  Gram stain     Status: None   Collection Time: 09/21/21  3:30 PM   Specimen: Pleura  Result Value Ref Range Status   Specimen Description PLEURAL  Final   Special Requests NONE  Final   Gram Stain   Final    NO ORGANISMS SEEN Performed at [facility], [address], [place], [place] [address]    Report Status 09/21/2021 FINAL  Final  Acid Fast Smear (AFB)     Status: None   Collection Time: 09/21/21  3:38 PM   Specimen: Pleural, Left; Body Fluid  Result Value Ref Range Status   AFB Specimen Processing Concentration  Final   Acid Fast Smear Negative  Final    Comment: (NOTE) Performed At: [facility] [address] [place], [place] [id number] Jolene Schimke MD OZ:[id number]    Source (AFB) PLEURAL  Final    Comment: Performed at [facility], [address], [place], [place] [address]    Labs: CBC: Recent Labs  Lab 09/23/21 0554 09/24/21 0557 09/25/21 0525 09/26/21 0818 09/28/21 0626  WBC 7.5 6.2 6.2  5.9 7.5  NEUTROABS 6.9 5.4  --   --  5.5  HGB 10.6* 9.8* 10.2* 10.6* 10.9*  HCT 35.3* 32.7* 33.9* 36.5* 37.8*  MCV 73.7* 73.6* 73.2* 72.6* 73.8*  PLT 237 228 207 235 207   Basic Metabolic Panel: Recent Labs  Lab 09/25/21 0525 09/26/21 0818 09/27/21 0533 09/28/21 0626 09/29/21 0530  NA 140 140 137 139 141  K 3.4* 3.7 3.1* 3.5 3.8  CL 93* 94* 93* 97* 101  CO2 34* 35* 33* 29 30  GLUCOSE 257* 209* 132* 138* 123*  BUN 18 16 18 18  22*  CREATININE 0.85 0.86 0.89 0.88 0.95  CALCIUM 8.4* 8.3* 8.1* 8.5* 8.5*  MG 1.7 1.8 2.0 1.9 1.9   Liver Function Tests: Recent Labs  Lab 09/23/21 0554  AST 14*  ALT 15  ALKPHOS 90  BILITOT 0.3  PROT 6.7  ALBUMIN 2.7*   CBG: Recent Labs  Lab 09/28/21 1134 09/28/21 1603 09/28/21 2038 09/29/21 0720 09/29/21 1052  GLUCAP 149* 204* 294* 142* 181*    Discharge time spent: greater than 30 minutes.  Signed: Marguerita Merles, DO [facility] 09/29/2021

## 2021-09-29 NOTE — Progress Notes (Signed)
Report called and given to  [facility] LPN at [facility]. Vital signs stable. No c/o pain or discomfort noted at this time. [facility] to transport patient to awaiting facility. ?

## 2021-09-29 NOTE — TOC Transition Note (Signed)
Transition of Care (TOC) - CM/SW Discharge Note ? ? ?Patient Details  ?Name: Derek Owen ?MRN: [id number] ?Date of Birth: October 27, 1965 ? ?Transition of Care (TOC) CM/SW Contact:  ?Mckenzy Salazar D, LCSW ?Phone Number: ?09/29/2021, 5:12 PM ? ? ?Clinical Narrative:    ?Discharge clinicals sent to facility. Daughter notified. EMS to transport. RN to call repot. TOC signing off.  ? ? ?Final next level of care: [facility] ?Barriers to Discharge: No Barriers Identified ? ? ?Patient Goals and CMS Choice ?Patient states their goals for this hospitalization and ongoing recovery are:: Go to SNF ?CMS Medicare.gov Compare Post Acute Care list provided to:: Patient ?Choice offered to / list presented to : Patient ? ?Discharge Placement ?  ?           ?Patient [facility] bed at: [facility] ?Patient to be transferred to facility by: [facility] ?Name of family member notified: daughter ?Patient and family notified of of transfer: 09/29/21 ? ?Discharge Plan and Services ?In-house Referral: Clinical Social Work ?Discharge Planning Services: CM Consult ?Post Acute Care Choice: [place]          ?  ?  ?  ?  ?  ?  ?  ?  ?  ?  ? ?Social Determinants of Health (SDOH) Interventions ?  ? ? ?Readmission Risk Interventions ? ?  09/22/2021  ?  1:57 PM  ?Readmission Risk Prevention Plan  ?Transportation Screening Complete  ?Medication Review Press photographer) Complete  ?[place] or Home Care Consult Complete  ?SW Recovery Care/Counseling Consult Complete  ?Skilled Nursing Facility Complete  ? ? ? ? ? ?

## 2021-09-30 NOTE — Progress Notes (Signed)
Ambulance transportation present to pick up patient to transfer to rehab facility [facility]. Patient in stable condition noted SOB on exertion. All belongings given to patient and IV removed.  ?

## 2021-10-07 ENCOUNTER — Emergency Department (HOSPITAL_COMMUNITY): Payer: Medicaid Other

## 2021-10-07 ENCOUNTER — Encounter (HOSPITAL_COMMUNITY): Payer: Self-pay

## 2021-10-07 ENCOUNTER — Emergency Department (HOSPITAL_COMMUNITY)
Admission: EM | Admit: 2021-10-07 | Discharge: 2021-10-07 | Disposition: A | Discharge status: Home / Self Care | Payer: Medicaid Other | Attending: Emergency Medicine | Admitting: Emergency Medicine

## 2021-10-07 ENCOUNTER — Other Ambulatory Visit: Payer: Self-pay

## 2021-10-07 DIAGNOSIS — D649 Anemia, unspecified: Secondary | ICD-10-CM | POA: Insufficient documentation

## 2021-10-07 DIAGNOSIS — I10 Essential (primary) hypertension: Secondary | ICD-10-CM | POA: Diagnosis not present

## 2021-10-07 DIAGNOSIS — M0580 Other rheumatoid arthritis with rheumatoid factor of unspecified site: Secondary | ICD-10-CM | POA: Diagnosis present

## 2021-10-07 DIAGNOSIS — J90 Pleural effusion, not elsewhere classified: Secondary | ICD-10-CM | POA: Diagnosis not present

## 2021-10-07 DIAGNOSIS — Z6837 Body mass index (BMI) 37.0-37.9, adult: Secondary | ICD-10-CM | POA: Diagnosis not present

## 2021-10-07 DIAGNOSIS — M069 Rheumatoid arthritis, unspecified: Secondary | ICD-10-CM | POA: Diagnosis not present

## 2021-10-07 DIAGNOSIS — Z7985 Long-term (current) use of injectable non-insulin antidiabetic drugs: Secondary | ICD-10-CM | POA: Diagnosis not present

## 2021-10-07 DIAGNOSIS — Z7984 Long term (current) use of oral hypoglycemic drugs: Secondary | ICD-10-CM | POA: Insufficient documentation

## 2021-10-07 DIAGNOSIS — J9621 Acute and chronic respiratory failure with hypoxia: Secondary | ICD-10-CM | POA: Diagnosis present

## 2021-10-07 DIAGNOSIS — Z8673 Personal history of transient ischemic attack (TIA), and cerebral infarction without residual deficits: Secondary | ICD-10-CM | POA: Insufficient documentation

## 2021-10-07 DIAGNOSIS — Z79899 Other long term (current) drug therapy: Secondary | ICD-10-CM | POA: Diagnosis not present

## 2021-10-07 DIAGNOSIS — R0602 Shortness of breath: Secondary | ICD-10-CM | POA: Diagnosis present

## 2021-10-07 DIAGNOSIS — C7800 Secondary malignant neoplasm of unspecified lung: Secondary | ICD-10-CM | POA: Diagnosis not present

## 2021-10-07 DIAGNOSIS — M130 Polyarthritis, unspecified: Secondary | ICD-10-CM | POA: Diagnosis not present

## 2021-10-07 DIAGNOSIS — Z7982 Long term (current) use of aspirin: Secondary | ICD-10-CM | POA: Insufficient documentation

## 2021-10-07 DIAGNOSIS — Z87891 Personal history of nicotine dependence: Secondary | ICD-10-CM | POA: Diagnosis not present

## 2021-10-07 DIAGNOSIS — Z8553 Personal history of malignant neoplasm of renal pelvis: Secondary | ICD-10-CM | POA: Diagnosis not present

## 2021-10-07 DIAGNOSIS — E119 Type 2 diabetes mellitus without complications: Secondary | ICD-10-CM | POA: Diagnosis not present

## 2021-10-07 DIAGNOSIS — Z720 Tobacco use: Secondary | ICD-10-CM | POA: Diagnosis present

## 2021-10-07 DIAGNOSIS — E1165 Type 2 diabetes mellitus with hyperglycemia: Secondary | ICD-10-CM

## 2021-10-07 LAB — CBC WITH DIFFERENTIAL/PLATELET
Abs Immature Granulocytes: 0.02 10*3/uL (ref 0.00–0.07)
Basophils Absolute: 0 10*3/uL (ref 0.0–0.1)
Basophils Relative: 0 %
Eosinophils Absolute: 0 10*3/uL (ref 0.0–0.5)
Eosinophils Relative: 0 %
HCT: 42.1 % (ref 39.0–52.0)
Hemoglobin: 12.1 g/dL — ABNORMAL LOW (ref 13.0–17.0)
Immature Granulocytes: 0 %
Lymphocytes Relative: 9 %
Lymphs Abs: 0.7 10*3/uL (ref 0.7–4.0)
MCH: 21.9 pg — ABNORMAL LOW (ref 26.0–34.0)
MCHC: 28.7 g/dL — ABNORMAL LOW (ref 30.0–36.0)
MCV: 76.3 fL — ABNORMAL LOW (ref 80.0–100.0)
Monocytes Absolute: 0.5 10*3/uL (ref 0.1–1.0)
Monocytes Relative: 6 %
Neutro Abs: 6.7 10*3/uL (ref 1.7–7.7)
Neutrophils Relative %: 85 %
Platelets: 221 10*3/uL (ref 150–400)
RBC: 5.52 MIL/uL (ref 4.22–5.81)
RDW: 19.1 % — ABNORMAL HIGH (ref 11.5–15.5)
Smear Review: ADEQUATE
WBC: 8.1 10*3/uL (ref 4.0–10.5)
nRBC: 0 % (ref 0.0–0.2)

## 2021-10-07 LAB — COMPREHENSIVE METABOLIC PANEL
ALT: 23 U/L (ref 0–44)
AST: 19 U/L (ref 15–41)
Albumin: 3.3 g/dL — ABNORMAL LOW (ref 3.5–5.0)
Alkaline Phosphatase: 85 U/L (ref 38–126)
Anion gap: 13 (ref 5–15)
BUN: 16 mg/dL (ref 6–20)
CO2: 26 mmol/L (ref 22–32)
Calcium: 8.8 mg/dL — ABNORMAL LOW (ref 8.9–10.3)
Chloride: 98 mmol/L (ref 98–111)
Creatinine, Ser: 0.93 mg/dL (ref 0.61–1.24)
GFR, Estimated: >60 mL/min (ref >60)
Glucose, Bld: 171 mg/dL — ABNORMAL HIGH (ref 70–99)
Potassium: 4.1 mmol/L (ref 3.5–5.1)
Sodium: 137 mmol/L (ref 135–145)
Total Bilirubin: 0.6 mg/dL (ref 0.3–1.2)
Total Protein: 7.6 g/dL (ref 6.5–8.1)

## 2021-10-07 LAB — LACTIC ACID, PLASMA: Lactic Acid, Venous: 2 mmol/L (ref 0.5–1.9)

## 2021-10-07 LAB — TROPONIN I (HIGH SENSITIVITY)
Troponin I (High Sensitivity): 7 ng/L (ref <18)
Troponin I (High Sensitivity): 7 ng/L (ref <18)

## 2021-10-07 LAB — BRAIN NATRIURETIC PEPTIDE: B Natriuretic Peptide: 24 pg/mL (ref 0.0–100.0)

## 2021-10-07 MED ORDER — FUROSEMIDE 10 MG/ML IJ SOLN
INTRAMUSCULAR | Status: AC
  Filled 2021-10-07: qty 8

## 2021-10-07 MED ORDER — VANCOMYCIN HCL 1250 MG/250ML IV SOLN: 1250.0000 mg | Freq: Two times a day (BID) | INTRAVENOUS | Status: DC

## 2021-10-07 MED ORDER — VANCOMYCIN HCL 2000 MG/400ML IV SOLN
2000.0000 mg | Freq: Once | INTRAVENOUS | Status: AC
  Administered 2021-10-07: 2000 mg via INTRAVENOUS
  Filled 2021-10-07: qty 400

## 2021-10-07 MED ORDER — SODIUM CHLORIDE 0.9 % IV SOLN
2.0000 g | Freq: Once | INTRAVENOUS | Status: AC
  Administered 2021-10-07: 2 g via INTRAVENOUS
  Filled 2021-10-07: qty 2

## 2021-10-07 MED ORDER — FUROSEMIDE 10 MG/ML IJ SOLN
80.0000 mg | Freq: Once | INTRAMUSCULAR | Status: AC
  Administered 2021-10-07: 80 mg via INTRAVENOUS

## 2021-10-07 MED ORDER — DOXYCYCLINE HYCLATE 100 MG PO CAPS: 100.0000 mg | ORAL_CAPSULE | Freq: Two times a day (BID) | ORAL | 0 refills | Status: AC

## 2021-10-07 NOTE — Procedures (Signed)
? ?  US guided Left Thoracentesis ? ?Hx Renal cell cancer ?Recurrent pleural effusion ? ?1.1 L bloody fluid ?No labs per MD ? ?Tolerated well ?EBL: None ? ?Post CXR:  No PTX per Dr Thornton Papas ?

## 2021-10-07 NOTE — ED Triage Notes (Signed)
Pt arrived via EMS with complaints of increased SOB since yesterday. EMS gave 125 mg solumedrol and 2 albuterol tx 5mg  total. Pt states hx of lung and kidney cancer. Pt wears 3L [place] at baseline.  ?

## 2021-10-07 NOTE — Progress Notes (Signed)
PT tolerated left sided thoracentesis procedure well today and 1.1 Liters of dark red colored fluid removed. PT verbalized understanding of post procedure instructions and taken to xray for post chest xray via stretcher. Chest xray read by radiologist then patient transported back to ED bed assignment and report given to patient's assigned RN. Patient voiced it was easier for him to take in a breath post procedure. ?

## 2021-10-07 NOTE — Assessment & Plan Note (Signed)
-   Cessation counseling has been encouraged. ?

## 2021-10-07 NOTE — ED Provider Notes (Signed)
?[facility] ?Provider Note ? ? ?CSN: [id number] ?Arrival date & time: 10/07/21  0935 ? ?  ? ?History ? ?Chief Complaint  ?Patient presents with  ? Shortness of Breath  ? ? ?Derek Owen is a 56 y.o. male. ? ?Patient complains of shortness of breath.  Patient has history of renal cancer with malignant effusions in his lung. ? ?The history is provided by the patient and medical records. No language interpreter was used.  ?Shortness of Breath ?Severity:  Moderate ?Onset quality:  Gradual ?Timing:  Constant ?Progression:  Waxing and waning ?Chronicity:  Recurrent ?Context: activity   ?Relieved by:  Nothing ?Worsened by:  Nothing ?Ineffective treatments:  None tried ?Associated symptoms: no abdominal pain, no chest pain, no cough, no headaches and no rash   ? ?  ? ?Home Medications ?Prior to Admission medications   ?Medication Sig Start Date End Date Taking? Authorizing Provider  ?acetaminophen (TYLENOL) 325 MG tablet Take 2 tablets (650 mg total) by mouth every 6 (six) hours as needed for mild pain (or Fever >/= 101). 09/29/21  Yes Sheikh, Omair Latif, DO  ?albuterol (VENTOLIN HFA) 108 (90 Base) MCG/ACT inhaler Inhale 1-2 puffs into the lungs every 4 (four) hours as needed for wheezing or shortness of breath. 09/14/62  Yes Lianne Cure, DO  ?Amino Acids-Protein Hydrolys (PRO-STAT) LIQD Take 30 mLs by mouth daily.   Yes [provider]  ?amLODipine (NORVASC) 10 MG tablet Take 1 tablet (10 mg total) by mouth daily. 06/17/21  Yes Fayrene Helper, MD  ?aspirin 81 MG chewable tablet Chew 81 mg by mouth daily.   Yes [provider]  ?atorvastatin (LIPITOR) 40 MG tablet Take 1 tablet (40 mg total) by mouth daily. 06/17/21  Yes Fayrene Helper, MD  ?budesonide (PULMICORT) 0.5 MG/2ML nebulizer solution Take 2 mLs (0.5 mg total) by nebulization 2 (two) times daily. 09/29/21  Yes Sheikh, Omair Latif, DO  ?cabozantinib (CABOMETYX) 40 MG tablet Take 1 tablet (40 mg total) by mouth daily.  Take on an empty stomach, 1 hour before or 2 hours after meals. 08/23/21  Yes Derek Jack, MD  ?fluticasone (FLONASE) 50 MCG/ACT nasal spray Place 2 sprays into both nostrils daily. 09/30/21  Yes Sheikh, Omair Latif, DO  ?folic acid (FOLVITE) 1 MG tablet Take 1 tablet (1 mg total) by mouth daily. 05/03/21  Yes Rice, Resa Miner, MD  ?furosemide (LASIX) 40 MG tablet Take 1 tablet (40 mg total) by mouth daily. 09/30/21  Yes Sheikh, Omair Latif, DO  ?glipiZIDE (GLUCOTROL) 10 MG tablet Take 1 tablet (10 mg total) by mouth daily before breakfast. 06/17/21  Yes Fayrene Helper, MD  ?ipratropium-albuterol (DUONEB) 0.5-2.5 (3) MG/3ML SOLN Take 3 mLs by nebulization 2 (two) times daily. 09/29/21  Yes Sheikh, Omair Latif, DO  ?loratadine (CLARITIN) 10 MG tablet Take 1 tablet (10 mg total) by mouth daily. 09/30/21  Yes Raiford Noble [place], DO  ?LORazepam (ATIVAN) 0.5 MG tablet Take 0.5 mg by mouth every 8 (eight) hours.   Yes [provider]  ?losartan (COZAAR) 100 MG tablet Take 1 tablet (100 mg total) by mouth daily. 09/30/21  Yes Sheikh, Omair Latif, DO  ?methotrexate (RHEUMATREX) 2.5 MG tablet Take 10 tablets (25 mg total) by mouth once a week. ?Patient taking differently: Take 25 mg by mouth once a week. 10 tabs on fridays 07/22/21  Yes Rice, Resa Miner, MD  ?ondansetron (ZOFRAN) 4 MG tablet Take 1 tablet (4 mg total) by mouth every 6 (  six) hours as needed for nausea. 09/29/21  Yes Sheikh, Omair Latif, DO  ?OXYGEN Inhale 3 L into the lungs daily.   Yes [provider]  ?pantoprazole (PROTONIX) 40 MG tablet Take 1 tablet (40 mg total) by mouth daily at 6 (six) AM. ?Patient taking differently: Take 40 mg by mouth daily. 09/30/21  Yes Sheikh, Omair Latif, DO  ?potassium chloride (KLOR-CON M) 20 MEQ tablet Take 2 tablets (40 mEq total) by mouth daily. 09/30/21  Yes Sheikh, Omair Latif, DO  ?senna-docusate (SENOKOT-S) 8.6-50 MG tablet Take 1 tablet by mouth at bedtime. 09/29/21  Yes Raiford Noble [place],  DO  ?Dulaglutide (TRULICITY) 6.71 IW/5.8KD SOPN Inject 0.75 mg into the skin once a week. On fridays    [provider]  ?Semaglutide (RYBELSUS) 14 MG TABS Take 14 mg by mouth daily. ?Patient not taking: Reported on 10/07/2021 04/26/21   Brita Romp, NP  ?   ? ?Allergies    ?Patient has no known allergies.   ? ?Review of Systems   ?Review of Systems  ?Constitutional:  Negative for appetite change and fatigue.  ?HENT:  Negative for congestion, ear discharge and sinus pressure.   ?Eyes:  Negative for discharge.  ?Respiratory:  Positive for shortness of breath. Negative for cough.   ?Cardiovascular:  Negative for chest pain.  ?Gastrointestinal:  Negative for abdominal pain and diarrhea.  ?Genitourinary:  Negative for frequency and hematuria.  ?Musculoskeletal:  Negative for back pain.  ?Skin:  Negative for rash.  ?Neurological:  Negative for seizures and headaches.  ?Psychiatric/Behavioral:  Negative for hallucinations.   ? ?Physical Exam ?Updated Vital Signs ?BP (!) 129/91   Pulse (!) 123   Temp 98 ?F (36.7 ?C) (Oral)   Resp (!) 22   Ht 6' (1.829 m)   Wt 125.4 kg   SpO2 100%   BMI 37.49 kg/m?  ?Physical Exam ?Vitals and nursing note reviewed.  ?Constitutional:   ?   Appearance: He is well-developed.  ?HENT:  ?   Head: Normocephalic.  ?   Nose: Nose normal.  ?Eyes:  ?   General: No scleral icterus. ?   Conjunctiva/sclera: Conjunctivae normal.  ?Neck:  ?   Thyroid: No thyromegaly.  ?Cardiovascular:  ?   Rate and Rhythm: Normal rate and regular rhythm.  ?   Heart sounds: No murmur heard. ?  No friction rub. No gallop.  ?Pulmonary:  ?   Breath sounds: No stridor. Rales present. No wheezing.  ?   Comments: Tachypneic ?Chest:  ?   Chest wall: No tenderness.  ?Abdominal:  ?   General: There is no distension.  ?   Tenderness: There is no abdominal tenderness. There is no rebound.  ?Musculoskeletal:     ?   General: Normal range of motion.  ?   Cervical back: Neck supple.  ?Lymphadenopathy:  ?   Cervical:  No cervical adenopathy.  ?Skin: ?   Findings: No erythema or rash.  ?Neurological:  ?   Mental Status: He is alert and oriented to person, place, and time.  ?   Motor: No abnormal muscle tone.  ?   Coordination: Coordination normal.  ?Psychiatric:     ?   Behavior: Behavior normal.  ? ? ?ED Results / Procedures / Treatments   ?Labs ?(all labs ordered are listed, but only abnormal results are displayed) ?Labs Reviewed  ?CBC WITH DIFFERENTIAL/PLATELET - Abnormal; Notable for the following components:  ?    Result Value  ? Hemoglobin 12.1 (*)   ?  MCV 76.3 (*)   ? MCH 21.9 (*)   ? MCHC 28.7 (*)   ? RDW 19.1 (*)   ? All other components within normal limits  ?COMPREHENSIVE METABOLIC PANEL - Abnormal; Notable for the following components:  ? Glucose, Bld 171 (*)   ? Calcium 8.8 (*)   ? Albumin 3.3 (*)   ? All other components within normal limits  ?LACTIC ACID, PLASMA - Abnormal; Notable for the following components:  ? Lactic Acid, Venous 2.0 (*)   ? All other components within normal limits  ?CULTURE, BLOOD (ROUTINE X 2)  ?CULTURE, BLOOD (ROUTINE X 2)  ?MRSA NEXT GEN BY PCR, NASAL  ?BRAIN NATRIURETIC PEPTIDE  ?TROPONIN I (HIGH SENSITIVITY)  ?TROPONIN I (HIGH SENSITIVITY)  ? ? ?EKG ?EKG Interpretation ? ?Date/Time:  Thursday October 07 2021 09:42:34 EDT ?Ventricular Rate:  129 ?PR Interval:  137 ?QRS Duration: 78 ?QT Interval:  303 ?QTC Calculation: 444 ?R Axis:   3 ?Text Interpretation: Sinus tachycardia Ventricular premature complex Aberrant conduction of SV complex(es) Low voltage, precordial leads Abnormal R-wave progression, early transition Borderline T abnormalities, lateral leads Confirmed by Milton Ferguson [phone]) on 10/07/2021 10:12:23 AM ? ?Radiology ?DG Chest 1 View ? 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 Mediastinal contours and pulmonary vascularity normal. BILATERAL pleural effusions and basilar atelectasis greater on LEFT. LEFT pleural effusion appears little changed since thoracentesi

## 2021-10-07 NOTE — Discharge Instructions (Signed)
Follow-up with Dr. Delton Coombes as planned ?

## 2021-10-07 NOTE — ED Notes (Signed)
Patient transported to CT 

## 2021-10-07 NOTE — Assessment & Plan Note (Addendum)
-  Body mass index is 37.49 kg/m?. ?-Class II obesity ?-Low calorie diet and portion control discussed with patient. ?

## 2021-10-07 NOTE — Assessment & Plan Note (Signed)
-  Resume home hypoglycemic regimen ?-Patient advised to follow modified carbohydrate diet. ?-Continue to follow CBGs and further adjust medications as needed based on blood sugar fluctuation. ?

## 2021-10-07 NOTE — Consult Note (Signed)
Initial Consultation Note ? ? ?Patient: Derek Owen ZOX:[id number] DOB: December 30, 1965 PCP: Nanine Means ?DOA: 10/07/2021 ?DOS: the patient was seen and examined on 10/07/2021 ?Primary service: Milton Ferguson, MD ? ?Referring physician: Dr. Roderic Palau ?Reason for consult: SOB and tachycardia. ? ?Assessment and Plan: ?* Recurrent pleural effusion ?- In the setting of malignant effusion ?-Acute thoracentesis provided by interventional radiology which yielded 1.1 L of bloody fluid. ?-Immediate significant improvement in patient's symptomatology, shortness of breath and tachypnea appreciated. ?-Patient recently discharge from the hospital with similar problems along with COPD exacerbation, currently no wheezing on examination and already has outpatient follow-up with cardiothoracic surgery for evaluation of Pleurx catheter. ?-Given concerns on chest x-ray/CT for possible bronchitis/bronchiectasis 7 days course of doxycycline 100 mg twice a day has been recommended. ?-Patient requested to be discharged back to skilled nursing facility at this point and I feel that is adequate and appropriate. ? ?Acute on chronic respiratory failure with hypoxia (HCC) ?- In the setting of recurrent malignant effusion ?-Significant improvement after thoracentesis provided ?-Given history of recent COPD exacerbation and high risk due to current use of chemotherapy agent will treat with 7 days of doxycycline. ?-Continue home oxygen supplementation and follow-up with cardiothoracic surgery and oncology service as recently recommended/arranged. ?-Resume the use of home bronchodilator management. ? ?Metastasis to lung [facility]) ?- Currently receiving oral chemotherapy ?-Continue patient follow-up with oncology service. ? ?Type 2 diabetes mellitus ([place]) ?-Resume home hypoglycemic regimen ?-Patient advised to follow modified carbohydrate diet. ?-Continue to follow CBGs and further adjust medications as needed based on blood sugar fluctuation. ? ?Tobacco  abuse ?- Cessation counseling has been encouraged. ? ?Morbid obesity ([place]) ?-Body mass index is 37.49 kg/m?. ?-Class II obesity ?-Low calorie diet and portion control discussed with patient. ? ?Essential hypertension ?-Continue home antihypertensive agent ?-Heart healthy/low-sodium diet has been encouraged. ?-Blood pressure overall stable. ? ?Polyarthritis with positive rheumatoid factor (HCC) ?-Continue patient follow-up with rheumatology service. ? ? ? ?TRH will sign off at present, please call us again when needed. ? ?HPI: Derek Owen is a 56 y.o. 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  Patient adequately treated with steroids, bronchodilators and thoracentesis.  Patient discharged with instruction to follow-up with cardiothoracic surgery and oncology service.  Return with a 48 hours worsening respiratory symptoms; he is residing at [facility] skilled nursing facility.  Reports no chest pain, no nausea, no vomiting, no fever, no chills, no focal weakness, no dysuria, no hematuria, no sick contacts that he is aware.  Patient is chronically on 3 L nasal cannula supplementation. ?Work-up in the emergency department demonstrating normal WBCs, no fever and reports just mild nonproductive coughing spells.  Vital signs with positive tachycardia and in the requiring 6 L nasal cannula supplementation.  Patient was tachypneic on examination. ? ?Chest x-ray and CT scan demonstrating worsening left-sided pleural effusion; which was adequately By interventional radiology with significant improvement in patient's symptoms. ? ?TRH was consulted to assist with patient management; after evaluation and  discussion of her with patient he opted to return back to skilled nursing facility with oral antibiotics for potential component of bronchitis/bronchiectasis and to continue outpatient follow-up with cardiothoracic surgery and oncology service.  Respiratory rate down to 18 with normal oxygen saturation  on chronic 3 L supplementation. ? ?Review of Systems: As mentioned in the history of present illness. All other systems reviewed and are negative. ?Past Medical History:  ?Diagnosis Date  ? Alcohol use disorder, mild, abuse   ? Cancer of kidney ([place])   ? Cigarette smoker   ? Diabetes mellitus without complication ([place])   ? Hyperlipidemia   ? Hypertension   ? Lung cancer ([place])   ? Polycythemia 04/13/2020  ? Stroke [facility])   ? x3  ? ?Past Surgical History:  ?Procedure Laterality Date  ? PLEURAL BIOPSY Right 07/25/2021  ? Procedure: PLEURAL BIOPSY;  Surgeon: Gaye Pollack, MD;  Location: [facility] OR;  Service: Thoracic;  Laterality: Right;  ? PLEURAL EFFUSION DRAINAGE Right 07/25/2021  ? Procedure: DRAINAGE OF PLEURAL EFFUSION;  Surgeon: Gaye Pollack, MD;  Location: [place];  Service: Thoracic;  Laterality: Right;  ? VIDEO ASSISTED THORACOSCOPY (VATS)/EMPYEMA Right 07/25/2021  ? Procedure: VIDEO ASSISTED THORACOSCOPY (VATS)/EMPYEMA;  Surgeon: Gaye Pollack, MD;  Location: [place];  Service: Thoracic;  Laterality: Right;  ? ?Social History:  reports that he quit smoking about 3 months ago. His smoking use included cigarettes. He smoked an average of 1 pack per day. He has never used smokeless tobacco. He reports that he does not currently use alcohol. He reports that he does not currently use drugs. ? ?No Known Allergies ? ?Family History  ?Adopted: Yes  ? ? ?Prior to Admission medications   ?Medication Sig Start Date End Date Taking? Authorizing Provider  ?acetaminophen (TYLENOL) 325 MG tablet Take 2 tablets (650 mg total) by mouth every 6 (six) hours as needed for mild pain (or Fever >/= 101). 09/29/21  Yes Sheikh, Omair Latif,  DO  ?albuterol (VENTOLIN HFA) 108 (90 Base) MCG/ACT inhaler Inhale 1-2 puffs into the lungs every 4 (four) hours as needed for wheezing or shortness of breath. 1/0/27  Yes Lianne Cure, DO  ?Amino Acids-Protein Hydrolys (PRO-STAT) LIQD Take 30 mLs by mouth daily.   Yes [provider]  ?amLODipine (NORVASC) 10 MG tablet Take 1 tablet (10 mg total) by mouth daily. 06/17/21  Yes Fayrene Helper, MD  ?aspirin 81 MG chewable tablet Chew 81 mg by mouth daily.   Yes [provider]  ?atorvastatin (LIPITOR) 40 MG tablet Take 1 tablet (40 mg total) by mouth daily. 06/17/21  Yes Fayrene Helper, MD  ?budesonide (PULMICORT) 0.5 MG/2ML nebulizer solution Take 2 mLs (0.5 mg total) by nebulization 2 (two) times daily. 09/29/21  Yes Sheikh, Omair Latif, DO  ?cabozantinib (CABOMETYX) 40 MG tablet Take 1 tablet (40 mg total) by mouth daily. Take on an empty stomach, 1 hour before or 2 hours after meals. 08/23/21  Yes Derek Jack, MD  ?doxycycline (VIBRAMYCIN) 100 MG capsule Take 1 capsule (100 mg total) by mouth 2 (two) times daily. One po bid x 7 days 10/07/21  Yes Milton Ferguson, MD  ?fluticasone (FLONASE) 50 MCG/ACT nasal spray Place 2 sprays into both nostrils daily. 09/30/21  Yes Sheikh, Omair Latif, DO  ?folic acid (FOLVITE) 1 MG tablet Take 1 tablet (1 mg total) by mouth daily. 05/03/21  Yes Rice, Resa Miner, MD  ?furosemide (LASIX) 40 MG tablet Take 1 tablet (40 mg total) by mouth daily. 09/30/21  Yes Sheikh, Omair Latif, DO  ?glipiZIDE (GLUCOTROL) 10 MG tablet Take 1 tablet (10 mg total) by mouth daily before breakfast. 06/17/21  Yes Fayrene Helper, MD  ?ipratropium-albuterol (DUONEB) 0.5-2.5 (3) MG/3ML SOLN Take 3 mLs  by nebulization 2 (two) times daily. 09/29/21  Yes Sheikh, Omair Latif, DO  ?loratadine (CLARITIN) 10 MG tablet Take 1 tablet (10 mg total) by mouth daily. 09/30/21  Yes Raiford Noble [place], DO  ?LORazepam (ATIVAN) 0.5 MG tablet Take 0.5 mg by mouth every 8 (eight) hours.    Yes [provider]  ?losartan (COZAAR) 100 MG tablet Take 1 tablet (100 mg total) by mouth daily. 09/30/21  Yes Sheikh, Omair Latif, DO  ?methotrexate (RHEUMATREX) 2.5 MG tablet Take 10 tablets (25 mg

## 2021-10-07 NOTE — ED Notes (Signed)
Lab at bedside

## 2021-10-07 NOTE — ED Notes (Signed)
Patient transported to Ultrasound 

## 2021-10-07 NOTE — Assessment & Plan Note (Signed)
-  Continue patient follow-up with rheumatology service. ?

## 2021-10-07 NOTE — ED Notes (Signed)
Pharmacy called at this time for LASIX. Pharmacist stated that Derek Owen is experiencing connection issues and medication will have to be overrided.  ?

## 2021-10-07 NOTE — ED Notes (Addendum)
Patient verbalized understanding of discharge instructions. No sign pad available. [facility] leaving with patient at this time.  ?

## 2021-10-07 NOTE — Progress Notes (Signed)
Pharmacy Antibiotic Note ? ?Derek KISE is a 56 y.o. male admitted on 10/07/2021 with pneumonia.  Pharmacy has been consulted for vancomycin dosing. ? ?Plan: ?Vancomycin 2000 mg IV x 1 dose. ?Vancomycin 1250 mg IV every 12 hours. ?Cefepime 2000 mg IV x 1 dose. F/U additional orders. ?Monitor labs, c/s, and vanco level as indicated. ? ?Height: 6' (182.9 cm) ?Weight: 125.4 kg (276 lb 7.3 oz) ?IBW/kg (Calculated) : 77.6 ? ?Temp (24hrs), Avg:98 ?F (36.7 ?C), Min:98 ?F (36.7 ?C), Max:98 ?F (36.7 ?C) ? ?Recent Labs  ?Lab 10/07/21 ?1012  ?WBC 8.1  ?CREATININE 0.93  ?  ?Estimated Creatinine Clearance: 119.9 mL/min (by C-G formula based on SCr of 0.93 mg/dL).   ? ?No Known Allergies ? ?Antimicrobials this admission: ?Vanco 3/30 >> ?Cefepime 3/30 ? ?Microbiology results: ?3/30 BCx: pending  ?3/30 MRSA PCR: pending ? ?Thank you for allowing pharmacy to be a part of this patient?s care. ? ?Margot Ables, PharmD ?Clinical Pharmacist ?10/07/2021 1:15 PM ? ?

## 2021-10-07 NOTE — Assessment & Plan Note (Signed)
-   In the setting of recurrent malignant effusion ?-Significant improvement after thoracentesis provided ?-Given history of recent COPD exacerbation and high risk due to current use of chemotherapy agent will treat with 7 days of doxycycline. ?-Continue home oxygen supplementation and follow-up with cardiothoracic surgery and oncology service as recently recommended/arranged. ?-Resume the use of home bronchodilator management. ?

## 2021-10-07 NOTE — Assessment & Plan Note (Signed)
-   In the setting of malignant effusion ?-Acute thoracentesis provided by interventional radiology which yielded 1.1 L of bloody fluid. ?-Immediate significant improvement in patient's symptomatology, shortness of breath and tachypnea appreciated. ?-Patient recently discharge from the hospital with similar problems along with COPD exacerbation, currently no wheezing on examination and already has outpatient follow-up with cardiothoracic surgery for evaluation of Pleurx catheter. ?-Given concerns on chest x-ray/CT for possible bronchitis/bronchiectasis 7 days course of doxycycline 100 mg twice a day has been recommended. ?-Patient requested to be discharged back to skilled nursing facility at this point and I feel that is adequate and appropriate. ?

## 2021-10-07 NOTE — Assessment & Plan Note (Signed)
-  Continue home antihypertensive agent ?-Heart healthy/low-sodium diet has been encouraged. ?-Blood pressure overall stable. ?

## 2021-10-07 NOTE — Assessment & Plan Note (Signed)
-   Currently receiving oral chemotherapy ?-Continue patient follow-up with oncology service. ?

## 2021-10-11 ENCOUNTER — Inpatient Hospital Stay (HOSPITAL_COMMUNITY): Payer: Medicaid Other | Attending: Hematology | Admitting: Hematology

## 2021-10-11 ENCOUNTER — Encounter (HOSPITAL_COMMUNITY): Payer: Self-pay | Admitting: Hematology

## 2021-10-11 VITALS — BP 138/92 | HR 124 | Temp 98.1°F | Resp 22

## 2021-10-11 DIAGNOSIS — Z79899 Other long term (current) drug therapy: Secondary | ICD-10-CM | POA: Insufficient documentation

## 2021-10-11 DIAGNOSIS — C782 Secondary malignant neoplasm of pleura: Secondary | ICD-10-CM | POA: Insufficient documentation

## 2021-10-11 DIAGNOSIS — C642 Malignant neoplasm of left kidney, except renal pelvis: Secondary | ICD-10-CM | POA: Diagnosis present

## 2021-10-11 DIAGNOSIS — J910 Malignant pleural effusion: Secondary | ICD-10-CM | POA: Diagnosis not present

## 2021-10-11 NOTE — Patient Instructions (Addendum)
[place] at [facility] ?Discharge Instructions ? ? ?You were seen and examined today by Dr. Delton Coombes. ? ?He discussed with you having a Pleurix catheter placed in order to drain the fluid from your lungs to save you from having to come to the hospital to get the fluid drained.  ? ?He also suggests you enroll in Hospice care.  It sounds like you are enrolled with [facility]. Continue to follow with them.  ? ? ?Thank you for choosing [place] at [facility] to provide your oncology and hematology care.  To afford each patient quality time with our provider, please arrive at least 15 minutes before your scheduled appointment time.  ? ?If you have a lab appointment with the [facility] please come in thru the Main Entrance and check in at the main information desk. ? ?You need to re-schedule your appointment should you arrive 10 or more minutes late.  We strive to give you quality time with our providers, and arriving late affects you and other patients whose appointments are after yours.  Also, if you no show three or more times for appointments you may be dismissed from the clinic at the providers discretion.     ?Again, thank you for choosing [facility].  Our hope is that these requests will decrease the amount of time that you wait before being seen by our physicians.       ?_____________________________________________________________ ? ?Should you have questions after your visit to [facility], please contact our office at [phone] and follow the prompts.  Our office hours are 8:00 a.m. and 4:30 p.m. Monday - Friday.  Please note that voicemails left after 4:00 p.m. may not be returned until the following business day.  We are closed weekends and major holidays.  You do have access to a nurse 24-7, just call the main number to the clinic [phone] and do not press any options, hold on the line and a nurse will answer  the phone.   ? ?For prescription refill requests, have your pharmacy contact our office and allow 72 hours.   ? ?Due to Covid, you will need to wear a mask upon entering the hospital. If you do not have a mask, a mask will be given to you at the Main Entrance upon arrival. For doctor visits, patients may have 1 support person age 11 or older with them. For treatment visits, patients can not have anyone with them due to social distancing guidelines and our immunocompromised population.  ? ?   ?

## 2021-10-11 NOTE — Progress Notes (Signed)
? ?[facility] ?[address] ?[place], [place] [address] ? ? ?CLINIC:  ?Medical Oncology/Hematology ? ?PCP:  ?[facility] [place] ?[address] [place] [address] ?[phone] ? ? 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 ?PRIOR THERAPY: none ? ?NGS Results: not done ? ?CURRENT THERAPY: surveillance ? ?BRIEF ONCOLOGIC HISTORY:  ?Oncology History  ? No history exists.  ? ? ?CANCER STAGING: ? Cancer Staging  ?Cancer of kidney parenchyma, left ([facility]) ?Staging form: Kidney, AJCC 8th Edition ?- Clinical stage from 08/09/2021: Stage IV (cT2b, cN0, pM1) - Unsigned ? ? ?INTERVAL HISTORY:  ?[name], a 56 y.o. 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 Derek Owen was last seen on 08/17/2021.  ? ?Today he reports feeling fair. His SOB is stable, and he report chest tightness. He denies orthopnea. He reports he has enrolled with [facility].   ? ?REVIEW OF SYSTEMS:  ?Review of Systems  ?Constitutional:  Positive for fatigue. Negative for appetite change.  ?Respiratory:  Positive for chest tightness and shortness of breath.   ?Gastrointestinal:  Positive for constipation.  ?All other systems reviewed and are negative. ? ?PAST MEDICAL/SURGICAL HISTORY:  ?Past Medical History:  ?Diagnosis Date  ? Alcohol use disorder, mild, abuse   ? Cancer of kidney ([facility])   ? Cigarette smoker   ? Diabetes mellitus without complication ([facility])   ? Hyperlipidemia   ? Hypertension   ? Lung cancer ([facility])   ? Polycythemia 04/13/2020  ? Stroke [facility])   ? x3  ? ?Past Surgical History:  ?Procedure Laterality Date  ? PLEURAL BIOPSY Right 07/25/2021  ? Procedure: PLEURAL BIOPSY;  Surgeon: Gaye Pollack, MD;  Location: [facility] OR;  Service: Thoracic;  Laterality: Right;  ? PLEURAL EFFUSION DRAINAGE Right 07/25/2021  ? Procedure: DRAINAGE OF PLEURAL EFFUSION;  Surgeon: Gaye Pollack, MD;  Location: [facility];  Service: Thoracic;  Laterality: Right;  ? VIDEO ASSISTED  THORACOSCOPY (VATS)/EMPYEMA Right 07/25/2021  ? Procedure: VIDEO ASSISTED THORACOSCOPY (VATS)/EMPYEMA;  Surgeon: Gaye Pollack, MD;  Location: [facility] OR;  Service: Thoracic;  Laterality: Right;  ? ? ?SOCIAL HISTORY:  ?Social History  ? ?Socioeconomic History  ? Marital status: Single  ?  Spouse name: Not on file  ? Number of children: Not on file  ? Years of education: Not on file  ? Highest education level: Not on file  ?Occupational History  ? Not on file  ?Tobacco Use  ? Smoking status: Former  ?  Packs/day: 1.00  ?  Types: Cigarettes  ?  Quit date: 07/02/2021  ?  Years since quitting: 0.2  ? Smokeless tobacco: Never  ?Vaping Use  ? Vaping Use: Never used  ?Substance and Sexual Activity  ? Alcohol use: Not Currently  ? Drug use: Not Currently  ? Sexual activity: Not Currently  ?Other Topics Concern  ? Not on file  ?Social History Narrative  ? Not on file  ? ?Social Determinants of Health  ? ?Financial Resource Strain: Not on file  ?Food Insecurity: Not on file  ?Transportation Needs: Not on file  ?Physical Activity: Not on file  ?Stress: Not on file  ?Social Connections: Not on file  ?Intimate Partner Violence: Not on file  ? ? ?FAMILY HISTORY:  ?Family History  ?Adopted: Yes  ? ? ?CURRENT MEDICATIONS:  ?Current Outpatient Medications  ?Medication Sig Dispense Refill  ? acetaminophen (TYLENOL) 325 MG tablet Take 2 tablets (  650 mg total) by mouth every 6 (six) hours as needed for mild pain (or Fever >/= 101). 20 tablet 0  ? albuterol (VENTOLIN HFA) 108 (90 Base) MCG/ACT inhaler Inhale 1-2 puffs into the lungs every 4 (four) hours as needed for wheezing or shortness of breath. 1 each 0  ? Amino Acids-Protein Hydrolys (PRO-STAT) LIQD Take 30 mLs by mouth daily.    ? amLODipine (NORVASC) 10 MG tablet Take 1 tablet (10 mg total) by mouth daily. 90 tablet 1  ? aspirin 81 MG chewable tablet Chew 81 mg by mouth daily.    ? atorvastatin (LIPITOR) 40 MG tablet Take 1 tablet (40 mg total) by mouth daily. 90 tablet 1  ?  budesonide (PULMICORT) 0.5 MG/2ML nebulizer solution Take 2 mLs (0.5 mg total) by nebulization 2 (two) times daily.  12  ? cabozantinib (CABOMETYX) 40 MG tablet Take 1 tablet (40 mg total) by mouth daily. Take on an empty stomach, 1 hour before or 2 hours after meals. 30 tablet 0  ? doxycycline (VIBRAMYCIN) 100 MG capsule Take 1 capsule (100 mg total) by mouth 2 (two) times daily. One po bid x 7 days 14 capsule 0  ? Dulaglutide (TRULICITY) 4.70 JG/2.8ZM SOPN Inject 0.75 mg into the skin once a week. On fridays    ? fluticasone (FLONASE) 50 MCG/ACT nasal spray Place 2 sprays into both nostrils daily.  2  ? folic acid (FOLVITE) 1 MG tablet Take 1 tablet (1 mg total) by mouth daily. 90 tablet 0  ? furosemide (LASIX) 40 MG tablet Take 1 tablet (40 mg total) by mouth daily. 30 tablet   ? glipiZIDE (GLUCOTROL) 10 MG tablet Take 1 tablet (10 mg total) by mouth daily before breakfast. 90 tablet 1  ? ipratropium-albuterol (DUONEB) 0.5-2.5 (3) MG/3ML SOLN Take 3 mLs by nebulization 2 (two) times daily. 360 mL   ? loratadine (CLARITIN) 10 MG tablet Take 1 tablet (10 mg total) by mouth daily.    ? LORazepam (ATIVAN) 0.5 MG tablet Take 0.5 mg by mouth every 8 (eight) hours.    ? losartan (COZAAR) 100 MG tablet Take 1 tablet (100 mg total) by mouth daily.    ? methotrexate (RHEUMATREX) 2.5 MG tablet Take 10 tablets (25 mg total) by mouth once a week. (Patient taking differently: Take 25 mg by mouth once a week. 10 tabs on fridays) 50 tablet 0  ? ondansetron (ZOFRAN) 4 MG tablet Take 1 tablet (4 mg total) by mouth every 6 (six) hours as needed for nausea. 20 tablet 0  ? OXYGEN Inhale 3 L into the lungs daily.    ? pantoprazole (PROTONIX) 40 MG tablet Take 1 tablet (40 mg total) by mouth daily at 6 (six) AM. (Patient taking differently: Take 40 mg by mouth daily.)    ? potassium chloride (KLOR-CON M) 20 MEQ tablet Take 2 tablets (40 mEq total) by mouth daily.    ? Semaglutide (RYBELSUS) 14 MG TABS Take 14 mg by mouth daily. 90  tablet 3  ? senna-docusate (SENOKOT-S) 8.6-50 MG tablet Take 1 tablet by mouth at bedtime.    ? ?No current facility-administered medications for this visit.  ? ? ?ALLERGIES:  ?No Known Allergies ? ?PHYSICAL EXAM:  ?Performance status (ECOG): 0 - Asymptomatic ? ?Vitals:  ? 10/11/21 1020  ?BP: (!) 138/92  ?Pulse: (!) 124  ?Resp: (!) 22  ?Temp: 98.1 ?F (36.7 ?C)  ?SpO2: 99%  ? ?Wt Readings from Last 3 Encounters:  ?10/07/21 276 lb 7.3 oz (  125.4 kg)  ?09/29/21 281 lb 15.5 oz (127.9 kg)  ?08/31/21 (!) 303 lb (137.4 kg)  ? ?Physical Exam ?Vitals reviewed.  ?Constitutional:   ?   Appearance: Normal appearance. He is obese.  ?   Interventions: Nasal cannula in place.  ?   Comments: In wheelchair  ?Cardiovascular:  ?   Rate and Rhythm: Normal rate and regular rhythm.  ?   Pulses: Normal pulses.  ?   Heart sounds: Normal heart sounds.  ?Pulmonary:  ?   Effort: Pulmonary effort is normal.  ?   Breath sounds: Examination of the left-middle field reveals decreased breath sounds. Decreased breath sounds present.  ?Neurological:  ?   General: No focal deficit present.  ?   Mental Status: He is alert and oriented to person, place, and time.  ?Psychiatric:     ?   Mood and Affect: Mood normal.     ?   Behavior: Behavior normal.  ?  ? ?LABORATORY DATA:  ?I have reviewed the labs as listed.  ? ?  Latest Ref Rng & Units 10/07/2021  ? 10:12 AM 09/28/2021  ?  6:26 AM 09/26/2021  ?  8:18 AM  ?CBC  ?WBC 4.0 - 10.5 K/uL 8.1   7.5   5.9    ?Hemoglobin 13.0 - 17.0 g/dL 12.1   10.9   10.6    ?Hematocrit 39.0 - 52.0 % 42.1   37.8   36.5    ?Platelets 150 - 400 K/uL 221   207   235    ? ? ?  Latest Ref Rng & Units 10/07/2021  ? 10:12 AM 09/29/2021  ?  5:30 AM 09/28/2021  ?  6:26 AM  ?CMP  ?Glucose 70 - 99 mg/dL 171   123   138    ?BUN 6 - 20 mg/dL 16   22   18     ?Creatinine 0.61 - 1.24 mg/dL 0.93   0.95   0.88    ?Sodium 135 - 145 mmol/L 137   141   139    ?Potassium 3.5 - 5.1 mmol/L 4.1   3.8   3.5    ?Chloride 98 - 111 mmol/L 98   101   97    ?CO2  22 - 32 mmol/L 26   30   29     ?Calcium 8.9 - 10.3 mg/dL 8.8   8.5   8.5    ?Total Protein 6.5 - 8.1 g/dL 7.6      ?Total Bilirubin 0.3 - 1.2 mg/dL 0.6      ?Alkaline Phos 38 - 126 U/L 85      ?AST 15 - 41 U/L

## 2021-10-12 LAB — CULTURE, BLOOD (ROUTINE X 2)
Culture: NO GROWTH
Culture: NO GROWTH
Special Requests: ADEQUATE

## 2021-10-13 ENCOUNTER — Ambulatory Visit (INDEPENDENT_AMBULATORY_CARE_PROVIDER_SITE_OTHER): Payer: Self-pay | Admitting: Surgery

## 2021-10-13 ENCOUNTER — Ambulatory Visit
Admission: RE | Admit: 2021-10-13 | Discharge: 2021-10-13 | Disposition: A | Discharge status: Home / Self Care | Payer: Medicaid Other | Source: Ambulatory Visit | Attending: Surgery | Admitting: Surgery

## 2021-10-13 ENCOUNTER — Encounter: Payer: Self-pay | Admitting: Surgery

## 2021-10-13 ENCOUNTER — Other Ambulatory Visit: Payer: Self-pay | Admitting: Radiology

## 2021-10-13 VITALS — BP 118/90 | HR 125 | Resp 20 | Ht 72.0 in | Wt 271.0 lb

## 2021-10-13 DIAGNOSIS — J90 Pleural effusion, not elsewhere classified: Secondary | ICD-10-CM

## 2021-10-13 DIAGNOSIS — Z09 Encounter for follow-up examination after completed treatment for conditions other than malignant neoplasm: Secondary | ICD-10-CM

## 2021-10-13 DIAGNOSIS — J869 Pyothorax without fistula: Secondary | ICD-10-CM

## 2021-10-13 DIAGNOSIS — J939 Pneumothorax, unspecified: Secondary | ICD-10-CM

## 2021-10-13 NOTE — Progress Notes (Signed)
? ?HPI: ?Patient returns for routine postoperative follow-up having undergone right VATS with drainage of a large pleural effusion and pleural biopsies on 07/25/2021.Derek Owen ?The patient's early postoperative recovery while in the hospital was notable for an uncomplicated postoperative course.  The final pathology showed metastatic renal cell cancer. ?Since hospital discharge the patient reports that he has been treated by oncology with chemotherapy.  He has developed a recurrent large left pleural effusion treated with thoracentesis x2 in the past month.  He is scheduled for insertion of a left Pleurx catheter by interventional radiology tomorrow. ? ? ?Current Outpatient Medications  ?Medication Sig Dispense Refill  ? acetaminophen (TYLENOL) 325 MG tablet Take 2 tablets (650 mg total) by mouth every 6 (six) hours as needed for mild pain (or Fever >/= 101). 20 tablet 0  ? albuterol (VENTOLIN HFA) 108 (90 Base) MCG/ACT inhaler Inhale 1-2 puffs into the lungs every 4 (four) hours as needed for wheezing or shortness of breath. 1 each 0  ? Amino Acids-Protein Hydrolys (PRO-STAT) LIQD Take 30 mLs by mouth daily.    ? amLODipine (NORVASC) 10 MG tablet Take 1 tablet (10 mg total) by mouth daily. 90 tablet 1  ? aspirin 81 MG chewable tablet Chew 81 mg by mouth daily.    ? atorvastatin (LIPITOR) 40 MG tablet Take 1 tablet (40 mg total) by mouth daily. 90 tablet 1  ? budesonide (PULMICORT) 0.5 MG/2ML nebulizer solution Take 2 mLs (0.5 mg total) by nebulization 2 (two) times daily.  12  ? cabozantinib (CABOMETYX) 40 MG tablet Take 1 tablet (40 mg total) by mouth daily. Take on an empty stomach, 1 hour before or 2 hours after meals. 30 tablet 0  ? doxycycline (VIBRAMYCIN) 100 MG capsule Take 1 capsule (100 mg total) by mouth 2 (two) times daily. One po bid x 7 days 14 capsule 0  ? Dulaglutide (TRULICITY) 6.65 LD/3.5TS SOPN Inject 0.75 mg into the skin once a week. On fridays    ? fluticasone (FLONASE) 50 MCG/ACT nasal spray Place 2  sprays into both nostrils daily.  2  ? folic acid (FOLVITE) 1 MG tablet Take 1 tablet (1 mg total) by mouth daily. 90 tablet 0  ? furosemide (LASIX) 40 MG tablet Take 1 tablet (40 mg total) by mouth daily. 30 tablet   ? glipiZIDE (GLUCOTROL) 10 MG tablet Take 1 tablet (10 mg total) by mouth daily before breakfast. 90 tablet 1  ? ipratropium-albuterol (DUONEB) 0.5-2.5 (3) MG/3ML SOLN Take 3 mLs by nebulization 2 (two) times daily. 360 mL   ? loratadine (CLARITIN) 10 MG tablet Take 1 tablet (10 mg total) by mouth daily.    ? LORazepam (ATIVAN) 0.5 MG tablet Take 0.5 mg by mouth every 8 (eight) hours.    ? losartan (COZAAR) 100 MG tablet Take 1 tablet (100 mg total) by mouth daily.    ? methotrexate (RHEUMATREX) 2.5 MG tablet Take 10 tablets (25 mg total) by mouth once a week. (Patient taking differently: Take 25 mg by mouth once a week. 10 tabs on fridays) 50 tablet 0  ? ondansetron (ZOFRAN) 4 MG tablet Take 1 tablet (4 mg total) by mouth every 6 (six) hours as needed for nausea. 20 tablet 0  ? OXYGEN Inhale 3 L into the lungs daily.    ? pantoprazole (PROTONIX) 40 MG tablet Take 1 tablet (40 mg total) by mouth daily at 6 (six) AM. (Patient taking differently: Take 40 mg by mouth daily.)    ? potassium chloride (  KLOR-CON M) 20 MEQ tablet Take 2 tablets (40 mEq total) by mouth daily.    ? Semaglutide (RYBELSUS) 14 MG TABS Take 14 mg by mouth daily. 90 tablet 3  ? senna-docusate (SENOKOT-S) 8.6-50 MG tablet Take 1 tablet by mouth at bedtime.    ? ?No current facility-administered medications for this visit.  ? ? ?Physical Exam: ?BP 118/90 (BP Location: Right Arm, Patient Position: Sitting)   Pulse (!) 125   Resp 20   Ht 6' (1.829 m)   Wt 271 lb (122.9 kg)   SpO2 98% Comment: 3L O2   BMI 36.75 kg/m?  ?He looks chronically ill sitting in a wheelchair with oxygen. ?Lung exam reveals decreased breath sounds over the left lower lobe. ?The right chest incisions are well-healed. ? ?Diagnostic Tests: ? ?Narrative &  Impression  ?CLINICAL DATA:  History of malignant pleural effusion. Rule out ?pneumothorax. Short of breath. ?  ?EXAM: ?CHEST - 2 VIEW ?  ?COMPARISON:  10/07/2021 ?  ?FINDINGS: ?Moderate left pleural effusion is mildly larger. Progressive left ?lower lobe consolidation. ?  ?Pleural thickening/fluid on the right is unchanged. Right lower lobe ?airspace disease with slight progression. ?  ?Negative for heart failure. ?  ?IMPRESSION: ?Progressive left effusion and progressive left lower lobe ?consolidation ?  ?Right pleural thickening/fluid unchanged. Mild right lower lobe ?airspace disease with mild progression. ?  ?Negative for pneumothorax. ?  ?  ?Electronically Signed ?  By: Franchot Gallo M.D. ?  On: 10/13/2021 11:27 ?   ? ? ?Impression: ? 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  There has been no significant change in the appearance of the right chest since surgery but he has had recurrent left pleural effusion and is scheduled for insertion of a Pleurx catheter tomorrow by interventional radiology.  I do not think there is any other therapeutic options from a surgical standpoint. ? ?Plan: ? ?He will undergo left Pleurx catheter insertion tomorrow by interventional radiology and follow-up with oncology. ? ? ?Gaye Pollack, MD ?[facility] ?[phone] ? ?

## 2021-10-14 ENCOUNTER — Ambulatory Visit (HOSPITAL_COMMUNITY)
Admission: RE | Admit: 2021-10-14 | Discharge: 2021-10-14 | Disposition: A | Discharge status: Home / Self Care | Payer: Medicaid Other | Source: Ambulatory Visit | Attending: Hematology | Admitting: Hematology

## 2021-10-14 ENCOUNTER — Encounter (HOSPITAL_COMMUNITY): Payer: Self-pay

## 2021-10-14 DIAGNOSIS — E119 Type 2 diabetes mellitus without complications: Secondary | ICD-10-CM | POA: Diagnosis not present

## 2021-10-14 DIAGNOSIS — Z7985 Long-term (current) use of injectable non-insulin antidiabetic drugs: Secondary | ICD-10-CM | POA: Diagnosis not present

## 2021-10-14 DIAGNOSIS — M069 Rheumatoid arthritis, unspecified: Secondary | ICD-10-CM | POA: Insufficient documentation

## 2021-10-14 DIAGNOSIS — Z8673 Personal history of transient ischemic attack (TIA), and cerebral infarction without residual deficits: Secondary | ICD-10-CM | POA: Diagnosis not present

## 2021-10-14 DIAGNOSIS — I10 Essential (primary) hypertension: Secondary | ICD-10-CM | POA: Diagnosis not present

## 2021-10-14 DIAGNOSIS — J910 Malignant pleural effusion: Secondary | ICD-10-CM | POA: Diagnosis not present

## 2021-10-14 DIAGNOSIS — Z7982 Long term (current) use of aspirin: Secondary | ICD-10-CM | POA: Insufficient documentation

## 2021-10-14 DIAGNOSIS — Z79899 Other long term (current) drug therapy: Secondary | ICD-10-CM | POA: Insufficient documentation

## 2021-10-14 DIAGNOSIS — D649 Anemia, unspecified: Secondary | ICD-10-CM | POA: Diagnosis not present

## 2021-10-14 DIAGNOSIS — Z7984 Long term (current) use of oral hypoglycemic drugs: Secondary | ICD-10-CM | POA: Diagnosis not present

## 2021-10-14 DIAGNOSIS — C642 Malignant neoplasm of left kidney, except renal pelvis: Secondary | ICD-10-CM | POA: Diagnosis not present

## 2021-10-14 DIAGNOSIS — E785 Hyperlipidemia, unspecified: Secondary | ICD-10-CM | POA: Diagnosis not present

## 2021-10-14 HISTORY — PX: IR PERC PLEURAL DRAIN W/INDWELL CATH W/IMG GUIDE: IMG5383

## 2021-10-14 LAB — GLUCOSE, CAPILLARY: Glucose-Capillary: 183 mg/dL — ABNORMAL HIGH (ref 70–99)

## 2021-10-14 LAB — CBC
HCT: 43 % (ref 39.0–52.0)
Hemoglobin: 13 g/dL (ref 13.0–17.0)
MCH: 21.9 pg — ABNORMAL LOW (ref 26.0–34.0)
MCHC: 30.2 g/dL (ref 30.0–36.0)
MCV: 72.5 fL — ABNORMAL LOW (ref 80.0–100.0)
Platelets: 249 10*3/uL (ref 150–400)
RBC: 5.93 MIL/uL — ABNORMAL HIGH (ref 4.22–5.81)
RDW: 19.5 % — ABNORMAL HIGH (ref 11.5–15.5)
WBC: 5.5 10*3/uL (ref 4.0–10.5)
nRBC: 0 % (ref 0.0–0.2)

## 2021-10-14 LAB — PROTIME-INR
INR: 1.1 (ref 0.8–1.2)
Prothrombin Time: 13.8 seconds (ref 11.4–15.2)

## 2021-10-14 MED ORDER — MIDAZOLAM HCL 2 MG/2ML IJ SOLN
INTRAMUSCULAR | Status: AC
  Filled 2021-10-14: qty 2

## 2021-10-14 MED ORDER — FENTANYL CITRATE (PF) 100 MCG/2ML IJ SOLN
INTRAMUSCULAR | Status: AC | PRN
  Administered 2021-10-14: 50 ug via INTRAVENOUS
  Administered 2021-10-14 (×2): 25 ug via INTRAVENOUS

## 2021-10-14 MED ORDER — DIPHENHYDRAMINE HCL 50 MG/ML IJ SOLN
INTRAMUSCULAR | Status: AC | PRN
  Administered 2021-10-14: 25 mg via INTRAVENOUS

## 2021-10-14 MED ORDER — CEFAZOLIN SODIUM-DEXTROSE 2-4 GM/100ML-% IV SOLN
INTRAVENOUS | Status: AC
  Administered 2021-10-14: 2 g via INTRAVENOUS
  Filled 2021-10-14: qty 100

## 2021-10-14 MED ORDER — FENTANYL CITRATE (PF) 100 MCG/2ML IJ SOLN
INTRAMUSCULAR | Status: AC
  Filled 2021-10-14: qty 2

## 2021-10-14 MED ORDER — DIPHENHYDRAMINE HCL 50 MG/ML IJ SOLN
INTRAMUSCULAR | Status: AC
  Filled 2021-10-14: qty 1

## 2021-10-14 MED ORDER — MIDAZOLAM HCL 2 MG/2ML IJ SOLN
INTRAMUSCULAR | Status: AC | PRN
  Administered 2021-10-14 (×2): .5 mg via INTRAVENOUS

## 2021-10-14 MED ORDER — SODIUM CHLORIDE 0.9 % IV SOLN: INTRAVENOUS | Status: DC

## 2021-10-14 MED ORDER — CEFAZOLIN SODIUM-DEXTROSE 2-4 GM/100ML-% IV SOLN: 2.0000 g | Freq: Once | INTRAVENOUS | Status: AC

## 2021-10-14 NOTE — Progress Notes (Addendum)
Patient transported by [facility] from [facility] facility [phone]) ?CNA Gay Filler rode with patient on vehicle.  She is in the lobby waiting and will stay here while patient gets procedure done.  She has no phone with her.   ?[facility] contact info: [phone], driver's name is [place]. ?

## 2021-10-14 NOTE — Progress Notes (Signed)
Asked Metall[employer]ist, to call [employer] to request Pleurex catheter cannisters for d/c back to facility.   ? ?

## 2021-10-14 NOTE — Progress Notes (Signed)
Patient to IR at 1623 for pleurx cath placement. ? ?  Updated supervisor Ladell Heads) of [facility] of delay and inquired of ability of ride upon discharge.   Gay Filler (NT) that accompanied patient to [facility]; went home due to procedure delay. ? ?Called [facility] about transport back when patient returned from IR- stated if not too late he would get patient back to [facility]- if late would need to contact EMS for return.   ?

## 2021-10-14 NOTE — Discharge Instructions (Signed)
For questions /concerns may call Interventional Radiology at [phone] ? ?You may remove your dressing and shower tomorrow afternoon ? ?DO NOT use EMLA cream for 2 weeks after port placement as the cream will remove surgical glue on your incision.    ? ?                                             Indwelling Pleural Catheter Home Guide (Pleurex Drain) ? ?An indwelling pleural catheter is a thin, flexible tube that is inserted under your skin and into your chest. The catheter drains excess fluid that collects in the area between the chest wall and the lungs (pleural space). After the catheter is inserted, it can be attached to a bottle that collects fluid. ?The pleural catheter will allow you to drain fluid from your chest at home on a regular basis (sometimes daily). This will eliminate the need for frequent visits to the hospital or clinic to drain the fluid. The catheter may be removed after the excess fluid problem is resolved, usually after 2-3 months. It is important to follow instructions from your health care provider about how to drain and care for your catheter. ?What are the risks? ?Generally, this is a safe procedure. However, problems may occur, including: ?Infection. ?Skin damage around the catheter. ?Lung damage. ?Failure of the chest tube to work properly. ?Spreading of cancer cells along the catheter, if you have cancer. ?Supplies needed: ?Vacuum-sealed drainage bottle with attached drainage line. ?Sterile dressing. ?Sterile alcohol pads. ?Sterile gloves. ?Valve cap. ?Sterile gauze pads, 4 ? 4 inch (10 cm ? 10 cm). ?Tape. ?Adhesive dressing. ?Sterile foam catheter pad. ?How to care for your catheter and insertion site ?Wash your hands with soap and warm water before and after touching the catheter or insertion site. If soap and water are not available, use hand sanitizer. ?Check your bandage (dressing) daily to make sure it is clean and dry. ?Keep the skin around the catheter clean and dry. ?Check  the catheter regularly for any cracks or kinks in the tubing. ?Check your catheter insertion site every day for signs of infection. Check for: ?Skin breakdown. ?Redness, swelling, or pain. ?Fluid or blood. ?Warmth. ?Pus or a bad smell. ?How to drain your catheter ?You may need to drain your catheter every day, or more or less often as told by your health care provider. Follow instructions from your health care provider about how to drain your catheter. You may also refer to instructions that come with the drainage system. To drain the catheter: ?Wash your hands with soap and warm water. If soap and water are not available, use hand sanitizer. ?Carefully remove the dressing from around the catheter. ?Wash your hands again. ?Put on the gloves provided. ?Prepare the vacuum-sealed drainage bottle and drainage line. Close the drainage line of the vacuum-sealed drainage bottle by squeezing the pinch clamp or rolling the wheel of the roller clamp toward the bottle. The vacuum in the bottle will be lost if the line is not closed completely. ?Remove the access tip cover from the drainage line. Do not touch the end. Set it on a sterile surface. ?Remove the catheter valve cap and throw it away. ?Use an alcohol pad to clean the end of the catheter. ?Insert the access tip into the catheter valve. Make sure the valve and access tip are securely connected. Listen for  a click to confirm that they are connected. ?Insert the T plunger to break the vacuum seal on the drainage bottle. ?Open the clamp on the drainage line. ?Allow the catheter to drain. Keep the catheter and the drainage bottle below the level of your chest. There may be a one-way valve on the end of the tubing that will allow liquid and air to flow out of the catheter without letting air inside. ?Drain the amount of fluid as told by your health care provider. It usually takes 5-15 minutes. Do not drain more than 1000 mL of fluid. You may feel a little discomfort while  you are draining. If the pain is severe, stop draining and contact your health care provider. ?After you finish draining the catheter, remove the drainage bottle tubing from the catheter. ?Use a clean alcohol pad to wipe the catheter tip. ?Place a clean cap on the end of the catheter. ?Use an alcohol pad to clean the skin around the catheter. ?Allow the skin to air-dry. ?Put the catheter pad on your skin. Curl the catheter into loops and place it on the pad. Do not place the catheter on your skin. ?Replace the dressing over the catheter. ?Discard the drainage bottle as instructed by your health care provider. Do not reuse the drainage bottle. ?How to change your dressing ?Change your dressing at least once a week, or more often if needed to keep the dressing dry. Be sure to change the dressing whenever it becomes moist. Your health care provider will tell you how often to change your dressing. ?Wash your hands with soap and warm water. If soap and water are not available, use hand sanitizer. ?Gently remove the old dressing. Avoid using scissors to remove the dressing. Sharp objects may damage the catheter. ?Wash the skin around the insertion site with mild, fragrance-free soap and warm water. Rinse well, then pat the area dry with a clean cloth. ?Check the skin around the catheter for signs of infection. Check for: ?Skin breakdown. ?Redness, swelling, or pain. ?Fluid or blood. ?Warmth. ?Pus or a bad smell. ?If your catheter was stitched (sutured) to your skin, look at the suture to make sure it is still anchored in your skin. ?Do not apply creams, ointments, or alcohol to the area. Let your skin air-dry completely before you apply a new dressing. ?Curl the catheter into loops and place it on the sterile catheter pad. Do not place the catheter on your skin. ?If you do not have a pad, use a clean dressing. Slide the dressing under the disk that holds the drainage catheter in place. ?Use gauze to cover the catheter and  the catheter pad. The catheter should rest on the pad or dressing, not on your skin. ?Tape the dressing to your skin. You may be instructed to use an adhesive dressing covering instead of gauze and tape. ?Wash your hands with soap and warm water. If soap and water are not available, use hand sanitizer. ?General recommendations ?Always wash your hands with soap and warm water before and after caring for your catheter and drainage bottle. Use a mild, fragrance-free soap. If soap and water are not available, use hand sanitizer. ?Always make sure there are no leaks in the catheter or drainage bottle. ?Each time you drain the catheter, note the color and amount of fluid. ?Do not touch the tip of the catheter or the drainage bottle tubing. ?Do not reuse drainage bottles. ?Do not take baths, swim, or use a hot tub until  your health care provider approves. Ask your health care provider if you may take showers. You may only be allowed to take sponge baths. ?Take deep breaths regularly, followed by a cough. Doing this can help to prevent lung infection. ?Contact a health care provider if: ?You have any questions about caring for your catheter or drainage bottle. ?You still have pain at the catheter insertion site more than 2 days after your procedure. ?You have pain while draining your catheter. ?Your catheter becomes bent, twisted, or cracked. ?The connection between the catheter and the collection bottle becomes loose. ?You have any of these around your catheter insertion site or coming from it: ?Skin breakdown. ?Redness, swelling, or pain. ?Fluid or blood. ?Warmth. ?Pus or a bad smell. ?Get help right away if: ?You have a fever or chills. ?You have chest pain. ?You have dizziness or shortness of breath. ?You have severe redness, swelling, or pain at your catheter insertion site. ?The catheter comes out. ?The catheter is blocked or clogged. ?Summary ?An indwelling pleural catheter is a thin, flexible tube that is inserted  under your skin and into your chest. The catheter drains excess fluid that collects in the area between the chest wall and the lungs (pleural space). ?It is important to follow instructions from your

## 2021-10-14 NOTE — H&P (Signed)
? ? ?Referring Physician(s): ?Katragadda,Sreedhar ? ?Supervising Physician: Michaelle Birks ? ?Patient Status:  [facility] ? ?Chief Complaint: ? ?"I'm here to get a chest catheter" ? ?Subjective: ?Patient familiar to IR service from multiple left thoracenteses, latest on 10/07/21 yielding 1.1 L of fluid.  He has a past medical history of alcohol /tobacco abuse, diabetes, hypertension, rheumatoid arthritis, anemia,  hyperlipidemia, prior stroke, polycythemia and metastatic left renal cell carcinoma.  He underwent right VATS with drainage of a large pleural effusion and pleural biopsies on 07/25/2021. He presents now with recurrent large left malignant pleural effusion and is scheduled today for left Pleurx catheter placement.  Patient is enrolled with [facility].  He currently denies fever, headache, chest pain, abdominal/back pain, nausea, vomiting or bleeding.  He does have chronic dyspnea and occasional cough. ? ? ?Past Medical History:  ?Diagnosis Date  ? Alcohol use disorder, mild, abuse   ? Cancer of kidney ([place])   ? Cigarette smoker   ? Diabetes mellitus without complication ([place])   ? Hyperlipidemia   ? Hypertension   ? Lung cancer ([place])   ? Polycythemia 04/13/2020  ? Stroke [facility])   ? x3  ? ?Past Surgical History:  ?Procedure Laterality Date  ? PLEURAL BIOPSY Right 07/25/2021  ? Procedure: PLEURAL BIOPSY;  Surgeon: Gaye Pollack, MD;  Location: [facility] OR;  Service: Thoracic;  Laterality: Right;  ? PLEURAL EFFUSION DRAINAGE Right 07/25/2021  ? Procedure: DRAINAGE OF PLEURAL EFFUSION;  Surgeon: Gaye Pollack, MD;  Location: [place];  Service: Thoracic;  Laterality: Right;  ? VIDEO ASSISTED THORACOSCOPY (VATS)/EMPYEMA Right 07/25/2021  ? Procedure: VIDEO ASSISTED THORACOSCOPY (VATS)/EMPYEMA;  Surgeon: Gaye Pollack, MD;  Location: [place];  Service: Thoracic;  Laterality: Right;  ? ? ? ?Allergies: ?Patient has no known allergies. ? ?Medications: ?Prior to Admission medications   ?Medication Sig Start Date End Date  Taking? Authorizing Provider  ?acetaminophen (TYLENOL) 325 MG tablet Take 2 tablets (650 mg total) by mouth every 6 (six) hours as needed for mild pain (or Fever >/= 101). 09/29/21  Yes Sheikh, Omair Latif, DO  ?albuterol (VENTOLIN HFA) 108 (90 Base) MCG/ACT inhaler Inhale 1-2 puffs into the lungs every 4 (four) hours as needed for wheezing or shortness of breath. 0/1/77  Yes Campbell Stall P, DO  ?amLODipine (NORVASC) 10 MG tablet Take 1 tablet (10 mg total) by mouth daily. 06/17/21  Yes Fayrene Helper, MD  ?aspirin 81 MG chewable tablet Chew 81 mg by mouth daily.   Yes [provider]  ?atorvastatin (LIPITOR) 40 MG tablet Take 1 tablet (40 mg total) by mouth daily. 06/17/21  Yes Fayrene Helper, MD  ?budesonide (PULMICORT) 0.5 MG/2ML nebulizer solution Take 2 mLs (0.5 mg total) by nebulization 2 (two) times daily. 09/29/21  Yes Sheikh, Omair Latif, DO  ?cabozantinib (CABOMETYX) 40 MG tablet Take 1 tablet (40 mg total) by mouth daily. Take on an empty stomach, 1 hour before or 2 hours after meals. 08/23/21  Yes Derek Jack, MD  ?doxycycline (VIBRAMYCIN) 100 MG capsule Take 1 capsule (100 mg total) by mouth 2 (two) times daily. One po bid x 7 days 10/07/21  Yes Milton Ferguson, MD  ?fluticasone (FLONASE) 50 MCG/ACT nasal spray Place 2 sprays into both nostrils daily. 09/30/21  Yes Sheikh, Omair Latif, DO  ?folic acid (FOLVITE) 1 MG tablet Take 1 tablet (1 mg total) by mouth daily. 05/03/21  Yes Rice, Resa Miner, MD  ?furosemide (LASIX) 40 MG tablet Take 1 tablet (  40 mg total) by mouth daily. 09/30/21  Yes Sheikh, Omair Latif, DO  ?glipiZIDE (GLUCOTROL) 10 MG tablet Take 1 tablet (10 mg total) by mouth daily before breakfast. 06/17/21  Yes Fayrene Helper, MD  ?ipratropium-albuterol (DUONEB) 0.5-2.5 (3) MG/3ML SOLN Take 3 mLs by nebulization 2 (two) times daily. 09/29/21  Yes Sheikh, Omair Latif, DO  ?loratadine (CLARITIN) 10 MG tablet Take 1 tablet (10 mg total) by mouth daily. 09/30/21  Yes  Raiford Noble Williams, DO  ?LORazepam (ATIVAN) 0.5 MG tablet Take 0.5 mg by mouth every 8 (eight) hours.   Yes [provider]  ?losartan (COZAAR) 100 MG tablet Take 1 tablet (100 mg total) by mouth daily. 09/30/21  Yes Sheikh, Omair Latif, DO  ?ondansetron (ZOFRAN) 4 MG tablet Take 1 tablet (4 mg total) by mouth every 6 (six) hours as needed for nausea. 09/29/21  Yes Sheikh, Omair Latif, DO  ?OXYGEN Inhale 3 L into the lungs daily.   Yes [provider]  ?pantoprazole (PROTONIX) 40 MG tablet Take 1 tablet (40 mg total) by mouth daily at 6 (six) AM. ?Patient taking differently: Take 40 mg by mouth daily. 09/30/21  Yes Sheikh, Omair Latif, DO  ?potassium chloride (KLOR-CON M) 20 MEQ tablet Take 2 tablets (40 mEq total) by mouth daily. 09/30/21  Yes Raiford Noble [place], DO  ?Semaglutide (RYBELSUS) 14 MG TABS Take 14 mg by mouth daily. 04/26/21  Yes Reardon, Juanetta Beets, NP  ?senna-docusate (SENOKOT-S) 8.6-50 MG tablet Take 1 tablet by mouth at bedtime. 09/29/21  Yes Raiford Noble [place], DO  ?Amino Acids-Protein Hydrolys (PRO-STAT) LIQD Take 30 mLs by mouth daily.    [provider]  ?Dulaglutide (TRULICITY) 8.56 DJ/4.9FW SOPN Inject 0.75 mg into the skin once a week. On fridays    [provider]  ?methotrexate (RHEUMATREX) 2.5 MG tablet Take 10 tablets (25 mg total) by mouth once a week. ?Patient taking differently: Take 25 mg by mouth once a week. 10 tabs on fridays 07/22/21   Collier Salina, MD  ? ? ? ?Vital Signs: ?BP (!) 123/104   Pulse (!) 126   Temp 98 ?F (36.7 ?C) (Oral)   Resp (!) 36   SpO2 97%  ? ?Physical Exam patient awake, alert, tachypneic, chest with diminished breath sounds bases.  Heart with tachycardic but regular rhythm.  Abdomen, soft, positive bowel sounds, nontender.  No significant lower extremity edema. ? ?Imaging: ?DG Chest 2 View ? ?Result Date: 10/13/2021 ?CLINICAL DATA:  History of malignant pleural effusion. Rule out pneumothorax. Short of breath. EXAM:  CHEST - 2 VIEW COMPARISON:  10/07/2021 FINDINGS: Moderate left pleural effusion is mildly larger. Progressive left lower lobe consolidation. Pleural thickening/fluid on the right is unchanged. Right lower lobe airspace disease with slight progression. Negative for heart failure. IMPRESSION: Progressive left effusion and progressive left lower lobe consolidation Right pleural thickening/fluid unchanged. Mild right lower lobe airspace disease with mild progression. Negative for pneumothorax. Electronically Signed   By: Franchot Gallo M.D.   On: 10/13/2021 11:27   ? ?Labs: ? ?CBC: ?Recent Labs  ?  09/26/21 ?0818 09/28/21 ?0626 10/07/21 ?1012 10/14/21 ?1300  ?WBC 5.9 7.5 8.1 5.5  ?HGB 10.6* 10.9* 12.1* 13.0  ?HCT 36.5* 37.8* 42.1 43.0  ?PLT 235 207 221 249  ? ? ?COAGS: ?Recent Labs  ?  10/14/21 ?1300  ?INR 1.1  ? ? ?BMP: ?Recent Labs  ?  11/02/20 ?1422 11/20/20 ?2637 01/12/21 ?1344 07/12/21 ?0232 09/27/21 ?8588 09/28/21 ?5027 09/29/21 ?0530 10/07/21 ?1012  ?  NA 137   < > 139   < > 137 139 141 137  ?K 4.1   < > 4.0   < > 3.1* 3.5 3.8 4.1  ?CL 100   < > 100   < > 93* 97* 101 98  ?CO2 25   < > 29   < > 33* 29 30 26   ?GLUCOSE 279*   < > 143*   < > 132* 138* 123* 171*  ?BUN 24   < > 16   < > 18 18 22* 16  ?CALCIUM 9.7   < > 9.6   < > 8.1* 8.5* 8.5* 8.8*  ?CREATININE 1.25   < > 0.81   < > 0.89 0.88 0.95 0.93  ?GFRNONAA 64  --  99   < > >60 >60 >60 >60  ?GFRAA 74  --  115  --   --   --   --   --   ? < > = values in this interval not displayed.  ? ? ?LIVER FUNCTION TESTS: ?Recent Labs  ?  08/26/21 ?1610 09/21/21 ?1137 09/23/21 ?1610 10/07/21 ?1012  ?BILITOT 0.7 0.3 0.3 0.6  ?AST 20 25 14* 19  ?ALT 17 21 15 23   ?ALKPHOS 96 111 90 85  ?PROT 7.9 7.8 6.7 7.6  ?ALBUMIN 3.7 3.1* 2.7* 3.3*  ? ? ?Assessment and Plan: ?Patient familiar to IR service from multiple left thoracenteses, latest on 10/07/21 yielding 1.1 L of fluid.  He has a past medical history of alcohol /tobacco abuse, diabetes, hypertension, rheumatoid arthritis, anemia,   hyperlipidemia, prior stroke, polycythemia and metastatic left renal cell carcinoma.  He underwent right VATS with drainage of a large pleural effusion and pleural biopsies on 07/25/2021. He presents now with recur

## 2021-10-14 NOTE — Procedures (Signed)
Vascular and Interventional Radiology Procedure Note ? ?Patient: Derek Owen ?DOB: [birth year] ?Medical Record Number: [id number] ?Note Date/Time: 10/14/21 5:57 PM  ? ?Performing Physician: Michaelle Birks, MD ?Assistant(s): None ? ?Diagnosis:  Recurrent malignant pleural effusion ? ?Procedure:  ?TUNNELED LEFT PLEURAL DRAINAGE CATHETER PLACEMENT ?THERAPEUTIC THORACENTESIS ? ?Anesthesia: Conscious Sedation ?Complications: None ?Estimated Blood Loss: Minimal ?Specimens:  Sent None ? ?Findings:  ?Successful placement of a tunneled LEFT pleural drainage catheter was performed using [place] and [place] guidance. ? ?See detailed procedure note with images in PACS. ?The patient tolerated the procedure well without incident or complication and was returned to Recovery in stable condition.  ? ? ?Michaelle Birks, MD ?[facility] ?[facility] ? ? ?Pager. [phone] ?Clinic. [phone]  ?

## 2021-10-14 NOTE — Progress Notes (Signed)
Patient very SOB; short shallow labored with breathing 34-40/per minute. ?Patient has been informed at 1400 of delay in IR.Orvan Falconer PA talked with pt.  Of procedure and delay in timeframe for today. Pt. In for pleurx cath placement. ? ?1515 Dr. Maryelizabeth Kaufmann in to see patient. ? ?1530 patient became diaphoretic and continued with labored breathing. Sat% in 80s per NT. ? HR 130s.  Assisted patient to sit on side of bed for better breathing. Increased O2 to 6l/[place]  ? ?1600 patient breathing slightly better.   Still labored and shallow. Patient states"Ive been here all day, do I need to reschedule and come back another day?" Reassured patient that a delay would not be beneficial and another MD has been called in to assist IR procedures. Continues to sit on side of bed, RR 34-40.  ? ?

## 2021-10-18 ENCOUNTER — Ambulatory Visit: Payer: Managed Care, Other (non HMO) | Admitting: Pulmonary Disease

## 2021-10-27 ENCOUNTER — Ambulatory Visit: Payer: Medicaid Other | Admitting: Internal Medicine

## 2021-11-08 ENCOUNTER — Ambulatory Visit: Payer: Managed Care, Other (non HMO) | Admitting: Pulmonary Disease

## 2021-11-08 LAB — ACID FAST SMEAR (AFB, MYCOBACTERIA): Acid Fast Smear: NEGATIVE

## 2021-11-08 DEATH — deceased

## 2021-11-09 ENCOUNTER — Encounter: Payer: Managed Care, Other (non HMO) | Admitting: Nurse Practitioner

## 2022-05-04 LAB — ACID FAST CULTURE WITH REFLEXED SENSITIVITIES (MYCOBACTERIA): Acid Fast Culture: NEGATIVE

## 2024-03-14 IMAGING — US IR PERC PLEURAL DRAIN W/INDWELL CATH W/IMG GUIDE
1 series · 5 of 5 positions shown · non-contrast
Comparison: None.

CLINICAL DATA: Malignant pleural effusion.

EXAM:
INSERTION OF TUNNELED LEFT SIDED PLEURAL DRAINAGE CATHETER

[Series 1: ir rad eval and mgt. · 5 of 5 slices shown]
[im 1/5]
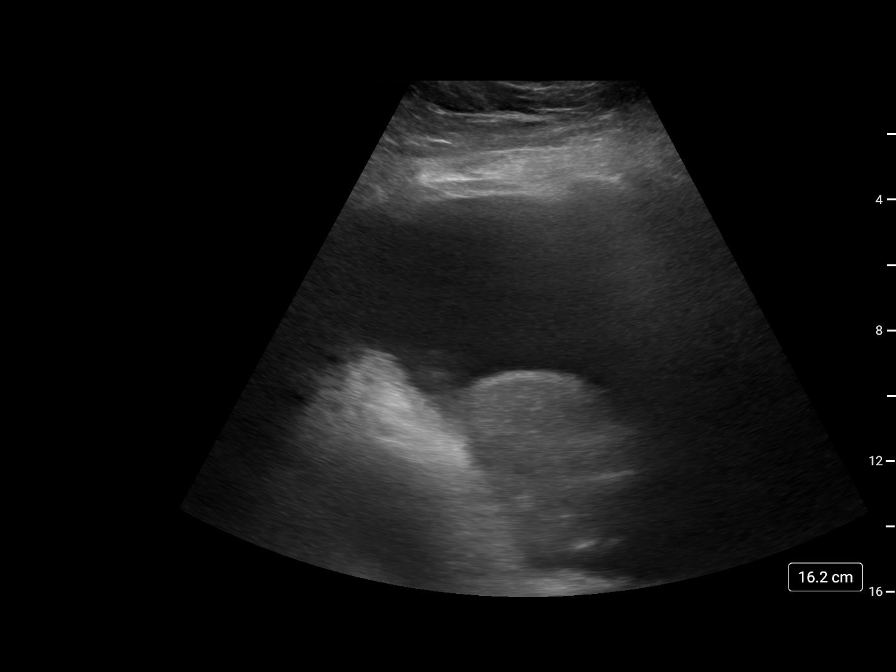
[im 2/5]
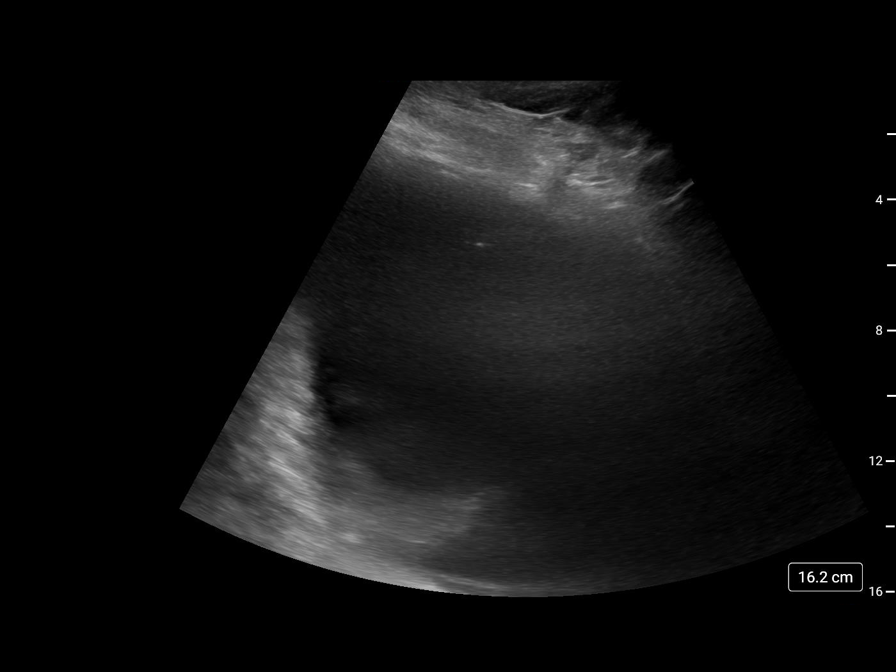
[im 3/5]
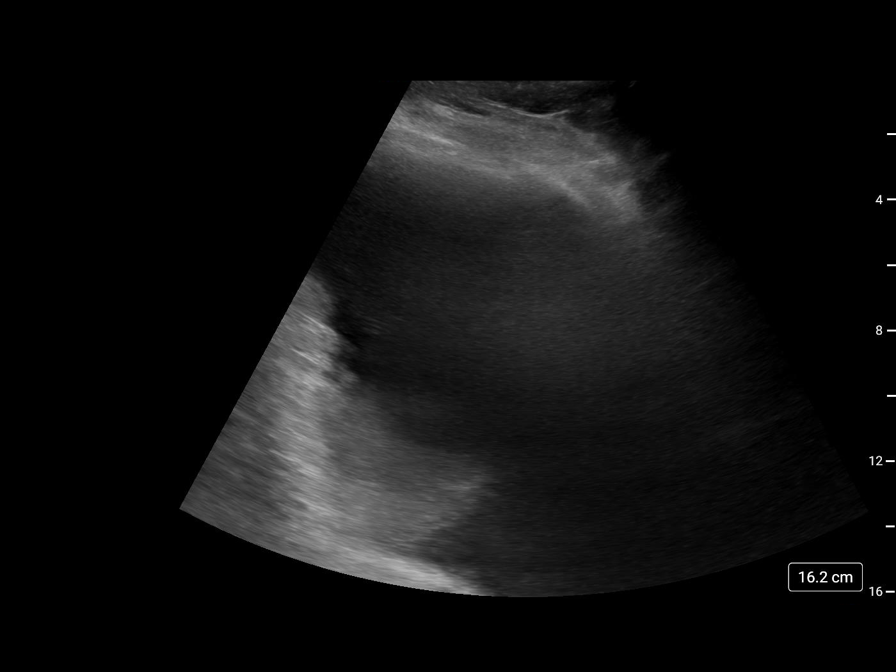
[im 4/5]
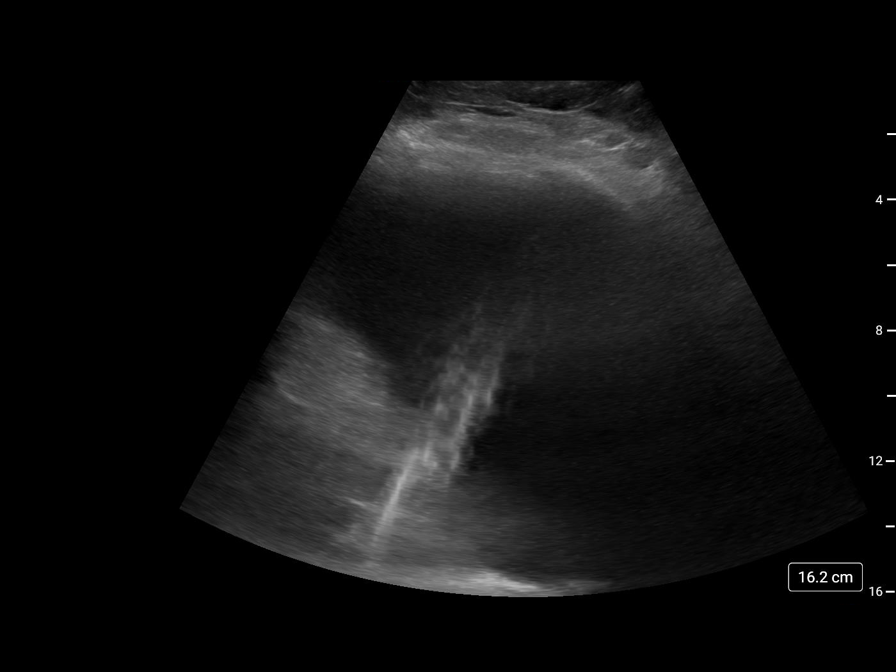
[im 5/5]
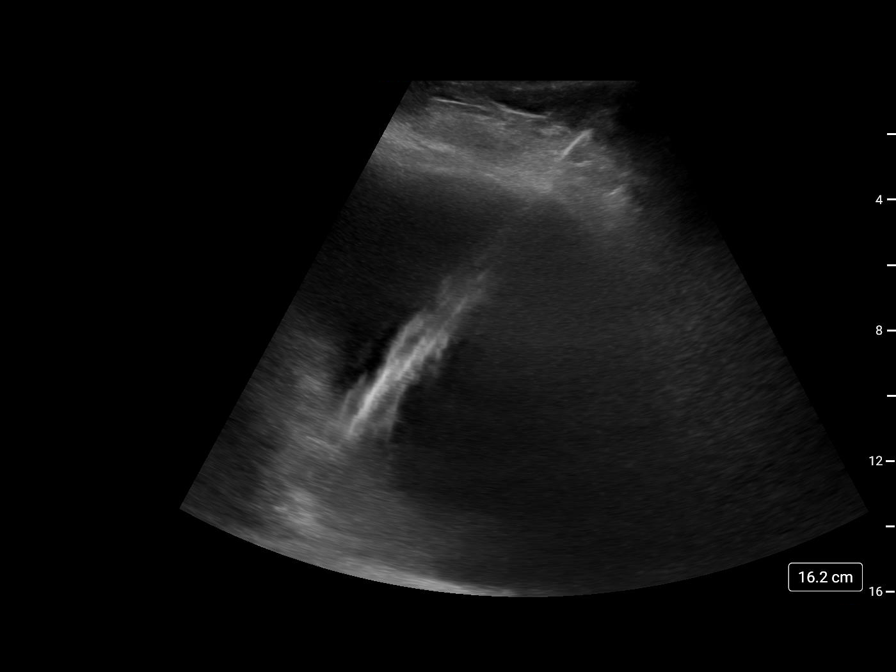

[5 of 5 positions shown; findings below may reference images not displayed]

MEDICATIONS:
Ancef 2 gm IV; Antibiotic was administered in an appropriate time
interval for the procedure.

ANESTHESIA/SEDATION:
Moderate (conscious) sedation was employed during this procedure. A
total of Versed 1 mg and Fentanyl 100 mcg was administered
intravenously.

Moderate Sedation Time: 28 minutes. The patient's level of
consciousness and vital signs were monitored continuously by
radiology nursing throughout the procedure under my direct
supervision.

FLUOROSCOPY:
(1 mGy)

COMPLICATIONS:
None immediate.

PROCEDURE:
The procedure, risks, benefits, and alternatives were explained to
the the patient and/or patient's representative, who wishes to
proceed with the placement of this permanent pleural catheter as
they are seeking palliative care. The the patient and/or patient's
representative understands and consents to the procedure.

The LEFT lateral chest and upper abdomen were prepped and draped in
a sterile fashion, and a sterile drape was applied covering the
operative field. A sterile gown and sterile gloves were used for the
procedure. Initial ultrasound scanning and fluoroscopic imaging
demonstrates a recurrent moderate to large pleural effusion.

Under direct ultrasound guidance, the inferior lateral pleural space
was accessed with Trupti Kirkpatrick needle after the overlying soft
tissues were anesthetized with 1% lidocaine with epinephrine. A wire
was then advanced under fluoroscopy into the pleural space.

A 15.5 French tunneled Pleur-X catheter was tunneled from an
incision within the LEFT upper abdominal quadrant to the access
site. The pleural access site was serially dilated under
fluoroscopy, ultimately allowing placement of a peel-away sheath.
The catheter was advanced through the peel-away sheath. The sheath
was then removed. Final catheter positioning was confirmed with a
fluoroscopic radiographic image.

The access incision was closed with an interrupted subcutaneous
subcuticular 4-0 Vicryl, Dermabond and Qimin. A Prolene
retention suture was applied at the catheter exit site. Large volume
thoracentesis was performed through the new catheter utilizing
provided bulb vacuum assisted drainage bag. Dressings were applied.
The patient tolerated the above procedure well without immediate
postprocedural complication.
FINDINGS: Preprocedural ultrasound scanning demonstrates a recurrent large
sized LEFT sided pleural effusion.

After ultrasound and fluoroscopic guided placement, the catheter is
directed LEFT posterior basilar

Following catheter placement, therapeutic thoracentesis was
performed and pleural fluid was removed.
IMPRESSION: Successful placement of a tunneled LEFT pleural drainage catheter
via lateral approach.
